# Patient Record
Sex: Female | Born: 1940 | Race: White | Hispanic: No | Smoking: Never smoker
Health system: Southern US, Community
[De-identification: ages and names within clinical notes are randomized; demographics above are authoritative.]

## PROBLEM LIST (undated history)

## (undated) DIAGNOSIS — Z87442 Personal history of urinary calculi: Secondary | ICD-10-CM

## (undated) DIAGNOSIS — Q613 Polycystic kidney, unspecified: Secondary | ICD-10-CM

## (undated) DIAGNOSIS — E559 Vitamin D deficiency, unspecified: Secondary | ICD-10-CM

## (undated) DIAGNOSIS — I10 Essential (primary) hypertension: Secondary | ICD-10-CM

## (undated) DIAGNOSIS — E785 Hyperlipidemia, unspecified: Secondary | ICD-10-CM

## (undated) DIAGNOSIS — M899 Disorder of bone, unspecified: Secondary | ICD-10-CM

## (undated) DIAGNOSIS — I700 Atherosclerosis of aorta: Secondary | ICD-10-CM

## (undated) DIAGNOSIS — M8580 Other specified disorders of bone density and structure, unspecified site: Secondary | ICD-10-CM

## (undated) DIAGNOSIS — K219 Gastro-esophageal reflux disease without esophagitis: Secondary | ICD-10-CM

## (undated) DIAGNOSIS — M47812 Spondylosis without myelopathy or radiculopathy, cervical region: Secondary | ICD-10-CM

## (undated) DIAGNOSIS — M949 Disorder of cartilage, unspecified: Secondary | ICD-10-CM

## (undated) DIAGNOSIS — F0390 Unspecified dementia without behavioral disturbance: Secondary | ICD-10-CM

## (undated) DIAGNOSIS — F419 Anxiety disorder, unspecified: Secondary | ICD-10-CM

## (undated) DIAGNOSIS — E039 Hypothyroidism, unspecified: Secondary | ICD-10-CM

## (undated) DIAGNOSIS — F32A Depression, unspecified: Secondary | ICD-10-CM

## (undated) DIAGNOSIS — G47429 Narcolepsy in conditions classified elsewhere without cataplexy: Secondary | ICD-10-CM

## (undated) DIAGNOSIS — G4700 Insomnia, unspecified: Secondary | ICD-10-CM

## (undated) DIAGNOSIS — C801 Malignant (primary) neoplasm, unspecified: Secondary | ICD-10-CM

## (undated) DIAGNOSIS — N19 Unspecified kidney failure: Secondary | ICD-10-CM

## (undated) HISTORY — DX: Polycystic kidney, unspecified: Q61.3

## (undated) HISTORY — DX: Other specified disorders of bone density and structure, unspecified site: M85.80

## (undated) HISTORY — DX: Atherosclerosis of aorta: I70.0

## (undated) HISTORY — DX: Disorder of bone, unspecified: M94.9

## (undated) HISTORY — DX: Hyperlipidemia, unspecified: E78.5

## (undated) HISTORY — PX: OTHER SURGICAL HISTORY: SHX169

## (undated) HISTORY — PX: CERVICAL DISC SURGERY: SHX588

## (undated) HISTORY — DX: Insomnia, unspecified: G47.00

## (undated) HISTORY — DX: Narcolepsy in conditions classified elsewhere without cataplexy: G47.429

## (undated) HISTORY — PX: ABDOMINAL HYSTERECTOMY: SHX81

## (undated) HISTORY — DX: Gastro-esophageal reflux disease without esophagitis: K21.9

## (undated) HISTORY — DX: Hypothyroidism, unspecified: E03.9

## (undated) HISTORY — DX: Anxiety disorder, unspecified: F41.9

## (undated) HISTORY — PX: PARTIAL NEPHRECTOMY: SHX414

## (undated) HISTORY — DX: Disorder of bone, unspecified: M89.9

## (undated) HISTORY — DX: Essential (primary) hypertension: I10

## (undated) HISTORY — PX: BUNIONECTOMY: SHX129

## (undated) HISTORY — DX: Unspecified kidney failure: N19

## (undated) HISTORY — DX: Vitamin D deficiency, unspecified: E55.9

## (undated) HISTORY — DX: Spondylosis without myelopathy or radiculopathy, cervical region: M47.812

---

## 1998-06-01 ENCOUNTER — Encounter: Payer: Self-pay | Admitting: Orthopedic Surgery

## 1998-06-11 ENCOUNTER — Observation Stay (HOSPITAL_COMMUNITY): Admission: RE | Admit: 1998-06-11 | Discharge: 1998-06-13 | Payer: Self-pay | Admitting: Orthopedic Surgery

## 1999-02-21 ENCOUNTER — Other Ambulatory Visit: Admission: RE | Admit: 1999-02-21 | Discharge: 1999-02-21 | Payer: Self-pay | Admitting: Family Medicine

## 2000-11-21 ENCOUNTER — Other Ambulatory Visit: Admission: RE | Admit: 2000-11-21 | Discharge: 2000-11-21 | Payer: Self-pay | Admitting: Family Medicine

## 2001-07-11 ENCOUNTER — Encounter: Payer: Self-pay | Admitting: Orthopedic Surgery

## 2001-07-12 ENCOUNTER — Observation Stay (HOSPITAL_COMMUNITY): Admission: RE | Admit: 2001-07-12 | Discharge: 2001-07-13 | Payer: Self-pay | Admitting: Orthopedic Surgery

## 2001-07-12 ENCOUNTER — Encounter: Payer: Self-pay | Admitting: Orthopedic Surgery

## 2002-05-20 ENCOUNTER — Encounter: Admission: RE | Admit: 2002-05-20 | Discharge: 2002-05-20 | Payer: Self-pay | Admitting: Family Medicine

## 2002-05-20 ENCOUNTER — Encounter: Payer: Self-pay | Admitting: Family Medicine

## 2002-07-23 ENCOUNTER — Encounter: Payer: Self-pay | Admitting: Urology

## 2002-07-29 ENCOUNTER — Encounter (INDEPENDENT_AMBULATORY_CARE_PROVIDER_SITE_OTHER): Payer: Self-pay

## 2002-07-29 ENCOUNTER — Inpatient Hospital Stay (HOSPITAL_COMMUNITY): Admission: RE | Admit: 2002-07-29 | Discharge: 2002-08-02 | Payer: Self-pay | Admitting: Urology

## 2002-07-29 HISTORY — PX: PARTIAL NEPHRECTOMY: SHX414

## 2002-08-01 ENCOUNTER — Encounter (HOSPITAL_BASED_OUTPATIENT_CLINIC_OR_DEPARTMENT_OTHER): Payer: Self-pay | Admitting: Internal Medicine

## 2002-11-10 ENCOUNTER — Encounter: Admission: RE | Admit: 2002-11-10 | Discharge: 2002-11-10 | Payer: Self-pay | Admitting: Urology

## 2002-11-10 ENCOUNTER — Encounter: Payer: Self-pay | Admitting: Urology

## 2002-12-18 ENCOUNTER — Encounter: Admission: RE | Admit: 2002-12-18 | Discharge: 2003-03-18 | Payer: Self-pay | Admitting: Neurology

## 2004-11-08 ENCOUNTER — Ambulatory Visit: Payer: Self-pay | Admitting: Family Medicine

## 2004-11-15 ENCOUNTER — Ambulatory Visit: Payer: Self-pay | Admitting: Family Medicine

## 2004-11-29 ENCOUNTER — Ambulatory Visit: Payer: Self-pay | Admitting: Family Medicine

## 2004-12-29 ENCOUNTER — Ambulatory Visit: Payer: Self-pay | Admitting: Gastroenterology

## 2005-01-25 ENCOUNTER — Ambulatory Visit: Payer: Self-pay | Admitting: Gastroenterology

## 2005-05-11 ENCOUNTER — Ambulatory Visit: Payer: Self-pay | Admitting: Family Medicine

## 2005-06-15 ENCOUNTER — Ambulatory Visit: Payer: Self-pay | Admitting: Family Medicine

## 2005-08-02 ENCOUNTER — Ambulatory Visit: Payer: Self-pay | Admitting: Family Medicine

## 2005-11-01 ENCOUNTER — Ambulatory Visit: Payer: Self-pay | Admitting: Family Medicine

## 2006-02-20 ENCOUNTER — Ambulatory Visit: Payer: Self-pay | Admitting: Family Medicine

## 2006-02-27 ENCOUNTER — Ambulatory Visit: Payer: Self-pay | Admitting: Family Medicine

## 2006-02-27 ENCOUNTER — Other Ambulatory Visit: Admission: RE | Admit: 2006-02-27 | Discharge: 2006-02-27 | Payer: Self-pay | Admitting: Family Medicine

## 2006-02-27 ENCOUNTER — Encounter: Payer: Self-pay | Admitting: Family Medicine

## 2006-03-06 ENCOUNTER — Encounter: Payer: Self-pay | Admitting: Family Medicine

## 2006-10-16 ENCOUNTER — Ambulatory Visit: Payer: Self-pay | Admitting: Family Medicine

## 2007-03-25 ENCOUNTER — Encounter: Payer: Self-pay | Admitting: Family Medicine

## 2007-03-25 DIAGNOSIS — E785 Hyperlipidemia, unspecified: Secondary | ICD-10-CM

## 2007-03-26 ENCOUNTER — Telehealth: Payer: Self-pay | Admitting: Family Medicine

## 2007-04-12 ENCOUNTER — Encounter: Payer: Self-pay | Admitting: Family Medicine

## 2007-05-27 ENCOUNTER — Ambulatory Visit: Payer: Self-pay | Admitting: Family Medicine

## 2007-05-27 LAB — CONVERTED CEMR LAB
Bilirubin Urine: NEGATIVE
Blood in Urine, dipstick: NEGATIVE
Glucose, Urine, Semiquant: NEGATIVE
Ketones, urine, test strip: NEGATIVE
Nitrite: NEGATIVE
Protein, U semiquant: NEGATIVE
Specific Gravity, Urine: 1.015
Urobilinogen, UA: 0.2
pH: 7

## 2007-06-12 ENCOUNTER — Ambulatory Visit: Payer: Self-pay | Admitting: Family Medicine

## 2007-06-12 DIAGNOSIS — K219 Gastro-esophageal reflux disease without esophagitis: Secondary | ICD-10-CM

## 2007-06-12 DIAGNOSIS — M949 Disorder of cartilage, unspecified: Secondary | ICD-10-CM

## 2007-06-12 DIAGNOSIS — M899 Disorder of bone, unspecified: Secondary | ICD-10-CM | POA: Insufficient documentation

## 2007-06-14 LAB — CONVERTED CEMR LAB
ALT: 15 units/L (ref 0–35)
AST: 20 units/L (ref 0–37)
Albumin: 4.3 g/dL (ref 3.5–5.2)
Basophils Absolute: 0 10*3/uL (ref 0.0–0.1)
Calcium: 9.4 mg/dL (ref 8.4–10.5)
Chloride: 105 meq/L (ref 96–112)
Cholesterol: 152 mg/dL (ref 0–200)
Eosinophils Absolute: 0.4 10*3/uL (ref 0.0–0.6)
GFR calc Af Amer: 71 mL/min
GFR calc non Af Amer: 59 mL/min
HDL: 43.1 mg/dL (ref >39.0)
MCHC: 34.3 g/dL (ref 30.0–36.0)
MCV: 90.1 fL (ref 78.0–100.0)
Neutro Abs: 3.1 10*3/uL (ref 1.4–7.7)
Platelets: 265 10*3/uL (ref 150–400)
RBC: 4.31 M/uL (ref 3.87–5.11)
Sodium: 145 meq/L (ref 135–145)
TSH: 0.87 microintl units/mL (ref 0.35–5.50)
Total CHOL/HDL Ratio: 3.5
Triglycerides: 105 mg/dL (ref 0–149)

## 2007-07-02 ENCOUNTER — Telehealth: Payer: Self-pay | Admitting: Family Medicine

## 2007-08-13 ENCOUNTER — Telehealth: Payer: Self-pay | Admitting: Family Medicine

## 2008-02-12 ENCOUNTER — Ambulatory Visit: Payer: Self-pay | Admitting: Family Medicine

## 2008-02-12 DIAGNOSIS — N19 Unspecified kidney failure: Secondary | ICD-10-CM

## 2008-02-12 DIAGNOSIS — E039 Hypothyroidism, unspecified: Secondary | ICD-10-CM

## 2008-02-12 DIAGNOSIS — Q613 Polycystic kidney, unspecified: Secondary | ICD-10-CM

## 2008-02-12 HISTORY — DX: Hypothyroidism, unspecified: E03.9

## 2008-02-12 HISTORY — DX: Unspecified kidney failure: N19

## 2008-02-12 HISTORY — DX: Polycystic kidney, unspecified: Q61.3

## 2008-02-13 ENCOUNTER — Ambulatory Visit: Payer: Self-pay | Admitting: Family Medicine

## 2008-02-17 LAB — CONVERTED CEMR LAB
CO2: 30 meq/L (ref 19–32)
Chloride: 100 meq/L (ref 96–112)
Creatinine, Ser: 1.2 mg/dL (ref 0.4–1.2)
GFR calc Af Amer: 57 mL/min
Hgb A1c MFr Bld: 5.9 % (ref 4.6–6.0)
Phosphorus: 3.5 mg/dL (ref 2.3–4.6)
Potassium: 4.1 meq/L (ref 3.5–5.1)
TSH: 0.28 microintl units/mL — ABNORMAL LOW (ref 0.35–5.50)

## 2008-04-07 ENCOUNTER — Telehealth: Payer: Self-pay | Admitting: Family Medicine

## 2008-05-19 ENCOUNTER — Ambulatory Visit: Payer: Self-pay | Admitting: Family Medicine

## 2008-05-21 LAB — CONVERTED CEMR LAB: Hgb A1c MFr Bld: 5.9 % (ref 4.6–6.0)

## 2008-06-16 ENCOUNTER — Telehealth: Payer: Self-pay | Admitting: Family Medicine

## 2008-06-17 ENCOUNTER — Ambulatory Visit: Payer: Self-pay | Admitting: Family Medicine

## 2008-06-17 DIAGNOSIS — G47429 Narcolepsy in conditions classified elsewhere without cataplexy: Secondary | ICD-10-CM

## 2008-06-17 DIAGNOSIS — G4700 Insomnia, unspecified: Secondary | ICD-10-CM | POA: Insufficient documentation

## 2008-07-15 ENCOUNTER — Telehealth: Payer: Self-pay | Admitting: Family Medicine

## 2008-08-31 ENCOUNTER — Telehealth: Payer: Self-pay | Admitting: Family Medicine

## 2008-09-02 ENCOUNTER — Ambulatory Visit: Payer: Self-pay | Admitting: Family Medicine

## 2008-10-01 ENCOUNTER — Ambulatory Visit: Payer: Self-pay | Admitting: Family Medicine

## 2008-10-01 LAB — CONVERTED CEMR LAB
Bilirubin Urine: NEGATIVE
Nitrite: NEGATIVE
Protein, U semiquant: NEGATIVE
Urobilinogen, UA: 0.2
WBC Urine, dipstick: NEGATIVE

## 2008-10-08 ENCOUNTER — Ambulatory Visit: Payer: Self-pay | Admitting: Family Medicine

## 2008-10-08 ENCOUNTER — Other Ambulatory Visit: Admission: RE | Admit: 2008-10-08 | Discharge: 2008-10-08 | Payer: Self-pay | Admitting: Family Medicine

## 2008-10-08 ENCOUNTER — Encounter: Payer: Self-pay | Admitting: Family Medicine

## 2008-10-08 LAB — CONVERTED CEMR LAB
ALT: 20 units/L (ref 0–35)
AST: 24 units/L (ref 0–37)
Basophils Absolute: 0 10*3/uL (ref 0.0–0.1)
Basophils Relative: 0.2 % (ref 0.0–3.0)
Bilirubin, Direct: 0.1 mg/dL (ref 0.0–0.3)
CO2: 33 meq/L — ABNORMAL HIGH (ref 19–32)
Calcium: 9.5 mg/dL (ref 8.4–10.5)
Chloride: 96 meq/L (ref 96–112)
Cholesterol: 135 mg/dL (ref 0–200)
Creatinine, Ser: 1.2 mg/dL (ref 0.4–1.2)
Glucose, Bld: 110 mg/dL — ABNORMAL HIGH (ref 70–99)
Hemoglobin: 12.8 g/dL (ref 12.0–15.0)
LDL Cholesterol: 76 mg/dL (ref 0–99)
Lymphocytes Relative: 23.6 % (ref 12.0–46.0)
MCHC: 34.1 g/dL (ref 30.0–36.0)
Monocytes Relative: 6.3 % (ref 3.0–12.0)
Neutro Abs: 4 10*3/uL (ref 1.4–7.7)
Neutrophils Relative %: 66.9 % (ref 43.0–77.0)
RBC: 4.22 M/uL (ref 3.87–5.11)
TSH: 1.12 microintl units/mL (ref 0.35–5.50)
Total Bilirubin: 0.8 mg/dL (ref 0.3–1.2)
Total CHOL/HDL Ratio: 3.3
Total Protein: 6.8 g/dL (ref 6.0–8.3)
VLDL: 18 mg/dL (ref 0–40)
WBC: 6 10*3/uL (ref 4.5–10.5)

## 2008-10-08 LAB — HM PAP SMEAR

## 2008-10-08 LAB — HM COLONOSCOPY

## 2008-10-13 ENCOUNTER — Encounter: Payer: Self-pay | Admitting: Family Medicine

## 2008-11-02 ENCOUNTER — Ambulatory Visit: Payer: Self-pay | Admitting: Family Medicine

## 2008-11-02 LAB — CONVERTED CEMR LAB: OCCULT 3: NEGATIVE

## 2008-11-11 ENCOUNTER — Encounter: Payer: Self-pay | Admitting: Family Medicine

## 2008-12-29 ENCOUNTER — Ambulatory Visit: Payer: Self-pay | Admitting: Family Medicine

## 2009-02-03 ENCOUNTER — Telehealth: Payer: Self-pay | Admitting: Family Medicine

## 2009-02-04 ENCOUNTER — Telehealth: Payer: Self-pay | Admitting: Family Medicine

## 2009-02-17 ENCOUNTER — Encounter: Payer: Self-pay | Admitting: Family Medicine

## 2009-03-09 ENCOUNTER — Ambulatory Visit: Payer: Self-pay | Admitting: Family Medicine

## 2009-03-09 DIAGNOSIS — N959 Unspecified menopausal and perimenopausal disorder: Secondary | ICD-10-CM | POA: Insufficient documentation

## 2009-03-09 HISTORY — DX: Unspecified menopausal and perimenopausal disorder: N95.9

## 2009-03-16 ENCOUNTER — Telehealth: Payer: Self-pay | Admitting: Family Medicine

## 2009-04-13 ENCOUNTER — Encounter: Payer: Self-pay | Admitting: Family Medicine

## 2009-04-15 ENCOUNTER — Encounter: Payer: Self-pay | Admitting: Family Medicine

## 2009-05-04 ENCOUNTER — Telehealth: Payer: Self-pay | Admitting: Family Medicine

## 2009-05-18 ENCOUNTER — Telehealth: Payer: Self-pay | Admitting: Family Medicine

## 2009-05-26 ENCOUNTER — Telehealth: Payer: Self-pay | Admitting: Family Medicine

## 2009-06-22 ENCOUNTER — Encounter (INDEPENDENT_AMBULATORY_CARE_PROVIDER_SITE_OTHER): Payer: Self-pay | Admitting: *Deleted

## 2009-07-05 ENCOUNTER — Encounter: Admission: RE | Admit: 2009-07-05 | Discharge: 2009-07-05 | Payer: Self-pay | Admitting: Urology

## 2009-07-07 ENCOUNTER — Ambulatory Visit (HOSPITAL_BASED_OUTPATIENT_CLINIC_OR_DEPARTMENT_OTHER): Admission: RE | Admit: 2009-07-07 | Discharge: 2009-07-07 | Payer: Self-pay | Admitting: Urology

## 2009-07-12 ENCOUNTER — Telehealth: Payer: Self-pay | Admitting: Family Medicine

## 2009-07-21 ENCOUNTER — Ambulatory Visit: Payer: Self-pay | Admitting: Family Medicine

## 2009-07-21 DIAGNOSIS — Z9889 Other specified postprocedural states: Secondary | ICD-10-CM | POA: Insufficient documentation

## 2009-07-29 ENCOUNTER — Encounter: Payer: Self-pay | Admitting: Family Medicine

## 2009-08-26 ENCOUNTER — Ambulatory Visit: Payer: Self-pay | Admitting: Family Medicine

## 2009-09-03 ENCOUNTER — Encounter: Payer: Self-pay | Admitting: Family Medicine

## 2009-09-21 ENCOUNTER — Ambulatory Visit: Payer: Self-pay | Admitting: Family Medicine

## 2009-10-04 ENCOUNTER — Telehealth: Payer: Self-pay | Admitting: Family Medicine

## 2009-10-05 ENCOUNTER — Ambulatory Visit: Payer: Self-pay | Admitting: Family Medicine

## 2009-10-05 DIAGNOSIS — R000 Tachycardia, unspecified: Secondary | ICD-10-CM

## 2009-10-05 HISTORY — DX: Tachycardia, unspecified: R00.0

## 2009-10-13 ENCOUNTER — Telehealth: Payer: Self-pay | Admitting: Family Medicine

## 2009-11-10 ENCOUNTER — Ambulatory Visit: Payer: Self-pay | Admitting: Family Medicine

## 2009-11-10 DIAGNOSIS — T50995A Adverse effect of other drugs, medicaments and biological substances, initial encounter: Secondary | ICD-10-CM | POA: Insufficient documentation

## 2009-11-10 DIAGNOSIS — E559 Vitamin D deficiency, unspecified: Secondary | ICD-10-CM

## 2009-11-10 HISTORY — DX: Vitamin D deficiency, unspecified: E55.9

## 2009-11-10 LAB — CONVERTED CEMR LAB
Blood in Urine, dipstick: NEGATIVE
Glucose, Urine, Semiquant: NEGATIVE
Ketones, urine, test strip: NEGATIVE
Protein, U semiquant: NEGATIVE
Specific Gravity, Urine: 1.015
pH: 7.5

## 2009-11-12 ENCOUNTER — Ambulatory Visit: Payer: Self-pay | Admitting: Internal Medicine

## 2009-11-18 ENCOUNTER — Telehealth: Payer: Self-pay | Admitting: Family Medicine

## 2009-11-18 LAB — CONVERTED CEMR LAB
Albumin: 3.9 g/dL (ref 3.5–5.2)
Alkaline Phosphatase: 45 units/L (ref 39–117)
BUN: 20 mg/dL (ref 6–23)
Basophils Absolute: 0 10*3/uL (ref 0.0–0.1)
Bilirubin, Direct: 0 mg/dL (ref 0.0–0.3)
CO2: 30 meq/L (ref 19–32)
Calcium: 9 mg/dL (ref 8.4–10.5)
Creatinine, Ser: 0.9 mg/dL (ref 0.4–1.2)
Eosinophils Absolute: 0.1 10*3/uL (ref 0.0–0.7)
Glucose, Bld: 88 mg/dL (ref 70–99)
HDL: 55.5 mg/dL (ref >39.00)
Lymphocytes Relative: 18.8 % (ref 12.0–46.0)
MCHC: 33.9 g/dL (ref 30.0–36.0)
Neutro Abs: 4.3 10*3/uL (ref 1.4–7.7)
Neutrophils Relative %: 70.3 % (ref 43.0–77.0)
Platelets: 263 10*3/uL (ref 150.0–400.0)
RDW: 12.1 % (ref 11.5–14.6)
Total Bilirubin: 0.5 mg/dL (ref 0.3–1.2)
Triglycerides: 133 mg/dL (ref 0.0–149.0)
VLDL: 26.6 mg/dL (ref 0.0–40.0)

## 2009-12-21 ENCOUNTER — Telehealth: Payer: Self-pay | Admitting: Family Medicine

## 2009-12-28 ENCOUNTER — Ambulatory Visit: Payer: Self-pay | Admitting: Family Medicine

## 2009-12-28 DIAGNOSIS — R609 Edema, unspecified: Secondary | ICD-10-CM | POA: Insufficient documentation

## 2010-01-20 ENCOUNTER — Ambulatory Visit: Payer: Self-pay | Admitting: Family Medicine

## 2010-01-20 DIAGNOSIS — L309 Dermatitis, unspecified: Secondary | ICD-10-CM | POA: Insufficient documentation

## 2010-01-20 DIAGNOSIS — L2089 Other atopic dermatitis: Secondary | ICD-10-CM

## 2010-01-20 HISTORY — DX: Dermatitis, unspecified: L30.9

## 2010-01-20 LAB — CONVERTED CEMR LAB
Bilirubin Urine: NEGATIVE
Glucose, Urine, Semiquant: NEGATIVE
Nitrite: NEGATIVE
Specific Gravity, Urine: 1.01
Urobilinogen, UA: 0.2

## 2010-01-31 ENCOUNTER — Ambulatory Visit: Payer: Self-pay | Admitting: Family Medicine

## 2010-01-31 ENCOUNTER — Telehealth: Payer: Self-pay | Admitting: Family Medicine

## 2010-01-31 LAB — CONVERTED CEMR LAB
Bilirubin Urine: NEGATIVE
Ketones, urine, test strip: NEGATIVE
Nitrite: NEGATIVE
Protein, U semiquant: NEGATIVE
Urobilinogen, UA: 0.2

## 2010-02-24 ENCOUNTER — Ambulatory Visit: Payer: Self-pay | Admitting: Family Medicine

## 2010-02-24 DIAGNOSIS — I10 Essential (primary) hypertension: Secondary | ICD-10-CM

## 2010-02-24 HISTORY — DX: Essential (primary) hypertension: I10

## 2010-07-07 ENCOUNTER — Telehealth: Payer: Self-pay | Admitting: Family Medicine

## 2010-08-25 ENCOUNTER — Encounter: Payer: Self-pay | Admitting: Family Medicine

## 2010-08-29 ENCOUNTER — Telehealth: Payer: Self-pay | Admitting: Family Medicine

## 2010-08-30 ENCOUNTER — Telehealth: Payer: Self-pay | Admitting: Family Medicine

## 2010-09-06 ENCOUNTER — Encounter: Payer: Self-pay | Admitting: Family Medicine

## 2010-09-20 NOTE — Assessment & Plan Note (Signed)
Summary: BILATERAL LEG EDEMA / RASH // RS   Vital Signs:  Patient profile:   70 year old female Weight:      132 pounds O2 Sat:      92 % on Room air Temp:     98.6 degrees F Pulse rate:   100 / minute BP sitting:   110 / 70  (left arm)  Vitals Entered By: Pura Spice, RN (January 20, 2010 1:07 PM)  O2 Flow:  Room air CC: burning with urination wants to be ck internally, stated had bladder tacked up 2 months agol sun poison feet    Allergies: 1)  ! Sulfa 2)  ! Codeine   Complete Medication List: 1)  Sertraline Hcl 100 Mg Tabs (Sertraline hcl) .Marland Kitchen.. 1 by mouth two times a day 2)  Klor-con M20 20 Meq Tbcr (Potassium chloride crys cr) .... Take 1 tablet two times a day 3)  Boniva 150 Mg Tabs (Ibandronate sodium) .... Take 1 monthly 4)  Furosemide 40 Mg Tabs (Furosemide) .... Once daily 5)  Dextroamphetamine Sulfate Cr 10 Mg Xr24h-cap (Dextroamphetamine sulfate) .Marland Kitchen.. 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 1 by mouth two times a day if needed  fill Jun 27 2009 7)  Dextroamphetamine Sulfate Cr 10 Mg Xr24h-cap (Dextroamphetamine sulfate) .Marland Kitchen.. 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 fill Jul 27 2009 8)  Zegerid 40-1100 Mg Caps (Omeprazole-sodium bicarbonate) .Marland Kitchen.. 1 once daily 9)  Premarin 0.625 Mg/gm Crea (Estrogens, conjugated) .... Use as directed 10)  Premarin 0.625 Mg Tabs (Estrogens conjugated) .Marland Kitchen.. 1 qd 11)  Macrobid 100 Mg Caps (Nitrofurantoin monohyd macro) .Marland Kitchen.. 1 by mouth two times a day 12)  Simvastatin 80 Mg Tabs (Simvastatin) .Marland Kitchen.. 1 once daily for hyperccholesteremia 13)  Amlodipine Besylate 10 Mg Tabs (Amlodipine besylate) .Marland Kitchen.. 1 each day for hypertension 14)  Lisinopril 20 Mg Tabs (Lisinopril) .Marland Kitchen.. 1 each day for edema and blood pressure and kidney protection 15)  Metoprolol Tartrate 100 Mg Tabs (Metoprolol tartrate) .Marland Kitchen.. 1 two times a day  for bp and heart rate 16)  Cipro 500 Mg Tabs  (Ciprofloxacin hcl) .Marland Kitchen.. 1 am aand  pm 17)  Hydrocodone-acetaminophen 5-500 Mg Tabs (Hydrocodone-acetaminophen) .... One q.4 h. p.r.n. for pain we'll 4 per day  Other Orders: UA Dipstick w/o Micro (automated)  (81003) T-Culture, Urine ([id number]) Prescriptions: HYDROCODONE-ACETAMINOPHEN 5-500 MG TABS (HYDROCODONE-ACETAMINOPHEN) one q.4 h. p.r.n. for pain we'll 4 per day  #100 x 5   Entered and Authorized by:   Judithann Sheen MD   Signed by:   Judithann Sheen MD on 01/31/2010   Method used:   Telephoned to ...       [facility] (retail)       [address]       [place], [place]  [id number]       Ph: [phone] or [phone]       Fax: [phone]   RxID:   [id number] CIPRO 500 MG TABS (CIPROFLOXACIN HCL) 1 AM aand  pm  #30 x 1   Entered and Authorized by:   Judithann Sheen MD   Signed by:   Judithann Sheen MD on 01/20/2010   Method used:   Electronically to        [facility]* (retail)       [address]  [place], [place]  [id number]       Ph: [phone] or [phone]       Fax: [phone]   RxID:   [id number]   Laboratory Results   Urine Tests    Routine Urinalysis   Color: yellow Appearance: Clear Glucose: negative   (Normal Range: Negative) Bilirubin: negative   (Normal Range: Negative) Ketone: negative   (Normal Range: Negative) Spec. Gravity: 1.010   (Normal Range: 1.003-1.035) Blood: trace-lysed   (Normal Range: Negative) pH: 6.0   (Normal Range: 5.0-8.0) Protein: negative   (Normal Range: Negative) Urobilinogen: 0.2   (Normal Range: 0-1) Nitrite: negative   (Normal Range: Negative) Leukocyte Esterace: 2+   (Normal Range: Negative)    Comments: Rita Ohara  January 20, 2010 1:16 PM     Appended Document: BILATERAL LEG EDEMA / RASH // RS     Vital Signs:  Patient profile:   70 year old female Weight:      132 pounds O2 Sat:      92 % Temp:     98.6 degrees F Pulse rate:   100 /  minute Resp:     16 per minute BP sitting:   110 / 70  (left arm)  History of Present Illness: This 70 year old married female over the past week has had urinary frequency and dysuria which has increased in severity. She also is complaining of a rash over the left lower extremities which he feels is on portions and she was sitting inside and when not truly sunburn but developed an erythematous rash as well as edema of the lower extremities She has had a urethral sling surgery and would like for me to do a pelvic exam and her determined that there was no problem Hypertensive patient with good blood pressure 110/70  Allergies: 1)  ! Sulfa 2)  ! Codeine  Past History:  Past Medical History: Last updated: 03/25/2007 Hyperlipidemia Hypertension polycystic kidney disease  Past Surgical History: Last updated: 08/26/2009 Nephrectomy  (partial left for angiomyolipoma Hysterectomy Cervical Disk Bunionectomy Fx RT Ankle LEFT TEMPORAL BX ARTERY --NEGATIVE 2  bladder operation, but per sling procedure  Review of Systems      See HPI  The patient denies anorexia, fever, weight loss, weight gain, vision loss, decreased hearing, hoarseness, chest pain, syncope, dyspnea on exertion, peripheral edema, prolonged cough, headaches, hemoptysis, abdominal pain, melena, hematochezia, severe indigestion/heartburn, hematuria, incontinence, genital sores, muscle weakness, suspicious skin lesions, transient blindness, difficulty walking, depression, unusual weight change, abnormal bleeding, enlarged lymph nodes, angioedema, breast masses, and testicular masses.    Physical Exam  General:  Well-developed,well-nourished,in no acute distress; alert,appropriate and cooperative throughout examination Head:  Normocephalic and atraumatic without obvious abnormalities. No apparent alopecia or balding. Lungs:  Normal respiratory effort, chest expands symmetrically. Lungs are clear to auscultation, no crackles or  wheezes. Heart:  Normal rate and regular rhythm. S1 and S2 normal without gallop, murmur, click, rub or other extra sounds. Abdomen:  no CVA tenderness, abdominal exam and  Especially over the suprapubic area was normal with no masses or tenderness Genitalia:  Normal introitus for age, no external lesions, no vaginal discharge, mucosa pink and moist, no vaginal or cervical lesions, no vaginal atrophy, no friaility or hemorrhage, normal uterus size and position, no adnexal masses or tenderness urethral sling in good condition Msk:  erythematous rash over the lower extremities bilaterally with a 2+ pretibial edema Extremities:  left pretibial edema and right pretibial edema.     Impression & Recommendations:  Problem # 1:  EDEMA (ICD-782.3) Assessment New  Her updated medication list for this problem includes:    Furosemide 40 Mg Tabs (Furosemide) ..... Once daily  Problem # 2:  DERMATITIS, ATOPIC (ICD-691.8) Assessment: New continue Allegra and apply the steroid cream that you have, triamcinolone  Problem # 3:  CYSTITIS, ACUTE (ICD-595.0) Assessment: New  Her updated medication list for this problem includes:    Macrobid 100 Mg Caps (Nitrofurantoin monohyd macro) .Marland Kitchen... 1 by mouth two times a day    Cipro 500 Mg Tabs (Ciprofloxacin hcl) .Marland Kitchen... 1 am aand  pm 2 culture urine  Orders: Prescription Created Electronically [id number])  Complete Medication List: 1)  Sertraline Hcl 100 Mg Tabs (Sertraline hcl) .Marland Kitchen.. 1 by mouth two times a day 2)  Klor-con M20 20 Meq Tbcr (Potassium chloride crys cr) .... Take 1 tablet two times a day 3)  Boniva 150 Mg Tabs (Ibandronate sodium) .... Take 1 monthly 4)  Furosemide 40 Mg Tabs (Furosemide) .... Once daily 5)  Dextroamphetamine Sulfate Cr 10 Mg Xr24h-cap (Dextroamphetamine sulfate) .Marland Kitchen.. 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 1 by mouth two times a day if needed  fill Jun 27 2009 7)  Dextroamphetamine Sulfate Cr 10 Mg Xr24h-cap (Dextroamphetamine sulfate) .Marland Kitchen.. 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 fill Jul 27 2009 8)  Zegerid 40-1100 Mg Caps (Omeprazole-sodium bicarbonate) .Marland Kitchen.. 1 once daily 9)  Premarin 0.625 Mg/gm Crea (Estrogens, conjugated) .... Use as directed 10)  Premarin 0.625 Mg Tabs (Estrogens conjugated) .Marland Kitchen.. 1 qd 11)  Macrobid 100 Mg Caps (Nitrofurantoin monohyd macro) .Marland Kitchen.. 1 by mouth two times a day 12)  Simvastatin 80 Mg Tabs (Simvastatin) .Marland Kitchen.. 1 once daily for hyperccholesteremia 13)  Amlodipine Besylate 10 Mg Tabs (Amlodipine besylate) .Marland Kitchen.. 1 each day for hypertension 14)  Lisinopril 20 Mg Tabs (Lisinopril) .Marland Kitchen.. 1 each day for edema and blood pressure and kidney protection 15)  Metoprolol Tartrate 100 Mg Tabs (Metoprolol tartrate) .Marland Kitchen.. 1 two times a day  for bp and heart rate 16)  Cipro 500 Mg Tabs (Ciprofloxacin hcl) .Marland Kitchen.. 1 am aand  pm 17)  Hydrocodone-acetaminophen 5-500 Mg Tabs (Hydrocodone-acetaminophen) .... One q.4 h. p.r.n. for pain we'll 4 per day  Patient Instructions: 1)  you have acute cystitis which I will treat with Cipro 500 mg b.i.d. for 2 weeks. I am going to culture the specimen to make certain we using Correct antibiotic 2)  Continue Allegra and triamcinolone as directed for rash which I feel  3)  His son poisoning 4)  Pelvic exam reveals everything to be normal and you have good results from your skin surgery

## 2010-09-20 NOTE — Progress Notes (Signed)
Summary: RX for UTI  Phone Note Call from Patient   Caller: Patient Call For: Judithann Sheen MD Summary of Call: Pt is out of town and is complaining of UTI.  [place].  Would like RX sent to pharmacy .........[facility]  [phone] Call pt when ready. No phone number left. Pt is complaining of frequency, dysuria x 1 day. no fever pain and minimal low back pain.  Initial call taken by: Lynann Beaver CMA,  Dec 21, 2009 8:39 AM  Follow-up for Phone Call        call in Macrobid 100 mg two times a day for 7 days Follow-up by: Nelwyn Salisbury MD,  Dec 21, 2009 1:11 PM  Additional Follow-up for Phone Call Additional follow up Details #1::        [facility]   left mess on pt home phone since she wanted to be called andno number to reach her.   Additional Follow-up by: Pura Spice, RN,  Dec 21, 2009 1:22 PM    New/Updated Medications: MACROBID 100 MG CAPS (NITROFURANTOIN MONOHYD MACRO) 1 by mouth two times a day

## 2010-09-20 NOTE — Letter (Signed)
Summary: [facility]   Imported By: Maryln Gottron 09/16/2009 13:36:03  _____________________________________________________________________  External Attachment:    Type:   Image     Comment:   External Document

## 2010-09-20 NOTE — Assessment & Plan Note (Signed)
Summary: FOLLOW UP ON BLOOD PRESSURE/CJR   Vital Signs:  Patient profile:   70 year old female Weight:      132 pounds Temp:     98.2 degrees F oral Pulse rate:   105 / minute Pulse rhythm:   regular BP sitting:   148 / 100  Vitals Entered By: Lynann Beaver CMA (September 21, 2009 11:45 AM) CC: BP check  Pt has manipulated all of her meds lately Is Patient Diabetic? No   History of Present Illness: This 70 year old white female with known hypertension has had problems in the past with being able to control it but recently had done were well. However in the past 2-3 weeks he has been elevated and she has not been able to manipulate her medications to control it. Her blood pressure on arrival today was 148/100, repeated 150/100 She has had no chest pain no shortness of breath no headache no dizziness however relates she does get tired after considerable exertion We spent approximately 20 minutes discussing some of the problems in her life and she desired my opinion regarding her activities and desire.  Current Medications (verified): 1)  Sertraline Hcl 100 Mg  Tabs (Sertraline Hcl) .Marland Kitchen.. 1 By Mouth Two Times A Day 2)  Metoprolol Succinate 200 Mg  Tb24 (Metoprolol Succinate) .... Once Daily 3)  Klor-Con M20 20 Meq  Tbcr (Potassium Chloride Crys Cr) .... Take 1 Tablet Two Times A Day 4)  Alprazolam 0.5 Mg  Tabs (Alprazolam) .... Three Times A Day As Needed  Fo Stress 5)  Vytorin 10-40 Mg  Tabs (Ezetimibe-Simvastatin) .... Once Daily 6)  Lotrel 10-40 Mg  Caps (Amlodipine Besy-Benazepril Hcl) .... Once Daily 7)  Boniva 150 Mg  Tabs (Ibandronate Sodium) .... Take 1 Monthly 8)  Furosemide 40 Mg  Tabs (Furosemide) .... Once Daily 9)  Clonidine Hcl 0.3 Mg  Tabs (Clonidine Hcl) .... Two Times A Day 10)  Zolpidem Tartrate 10 Mg  Tabs (Zolpidem Tartrate) .Marland Kitchen.. 1 At Bedtime As Needed For Sleep 11)  Hydrocodone-Acetaminophen 5-500 Mg  Tabs (Hydrocodone-Acetaminophen) .... Take One Tablet Every 4 Hrs  As Needed Pain  Maximum of 4 Tablets Per Day 12)  Protonix 40 Mg  Tbec (Pantoprazole Sodium) .Marland Kitchen.. 1 By Mouth Once Daily. 13)  Dextroamphetamine Sulfate Cr 10 Mg Xr24h-Cap (Dextroamphetamine Sulfate) .Marland Kitchen.. 1 By Mouth Two Times A Day As Needed  Fill For May 27 2009 14)  Dextroamphetamine Sulfate Cr 10 Mg Xr24h-Cap (Dextroamphetamine Sulfate) .Marland Kitchen.. 1 By Mouth Two Times A Day If Needed  Fill Jun 27 2009 15)  Dextroamphetamine Sulfate Cr 10 Mg Xr24h-Cap (Dextroamphetamine Sulfate) .Marland Kitchen.. 1 By Mouth Two Times A Day If Needed For Narcoplepsy. Fill Jul 27 2009 16)  Vesicare 5 Mg Tabs (Solifenacin Succinate) .Marland Kitchen.. 1 Qd 17)  Zegerid 40-1100 Mg Caps (Omeprazole-Sodium Bicarbonate) .Marland Kitchen.. 1 Once Daily 18)  Premarin 0.45 Mg Tabs (Estrogens Conjugated) .Marland Kitchen.. 1 Qd  Allergies (verified): 1)  ! Sulfa 2)  ! Codeine  Past History:  Past Medical History: Last updated: 03/25/2007 Hyperlipidemia Hypertension polycystic kidney disease  Past Surgical History: Last updated: 08/26/2009 Nephrectomy  (partial left for angiomyolipoma Hysterectomy Cervical Disk Bunionectomy Fx RT Ankle LEFT TEMPORAL BX ARTERY --NEGATIVE 2  bladder operation, but per sling procedure  Review of Systems      See HPI General:  See HPI; Denies chills, fatigue, fever, loss of appetite, malaise, sleep disorder, sweats, weakness, and weight loss. Eyes:  Denies blurring, discharge, double vision, eye irritation, eye  pain, halos, itching, light sensitivity, red eye, vision loss-1 eye, and vision loss-both eyes. ENT:  Denies decreased hearing, difficulty swallowing, ear discharge, earache, hoarseness, nasal congestion, nosebleeds, postnasal drainage, ringing in ears, sinus pressure, and sore throat. CV:  Denies bluish discoloration of lips or nails, chest pain or discomfort, difficulty breathing at night, difficulty breathing while lying down, fainting, fatigue, leg cramps with exertion, lightheadness, near fainting, palpitations, shortness of  breath with exertion, swelling of feet, swelling of hands, and weight gain. Resp:  Denies chest discomfort, chest pain with inspiration, cough, coughing up blood, excessive snoring, hypersomnolence, morning headaches, pleuritic, shortness of breath, sputum productive, and wheezing. GI:  See HPI. GU:  Denies abnormal vaginal bleeding, decreased libido, discharge, dysuria, genital sores, hematuria, incontinence, nocturia, urinary frequency, and urinary hesitancy; her problem was urinary urgency and incontinence is greatly improved since her bladder surgery. MS:  Denies joint pain, joint redness, joint swelling, loss of strength, low back pain, mid back pain, muscle aches, muscle , cramps, muscle weakness, stiffness, and thoracic pain.  Physical Exam  General:  Well-developed,well-nourished,in no acute distress; alert,appropriate and cooperative throughout examination Eyes:  her erythematous conjunctiva left eye minimal purulent drainage Lungs:  Normal respiratory effort, chest expands symmetrically. Lungs are clear to auscultation, no crackles or wheezes. Heart:  Normal rate and regular rhythm. S1 and S2 normal without gallop, murmur, click, rub or other extra sounds. Abdomen:  Bowel sounds positive,abdomen soft and non-tender without masses, organomegaly or hernias noted. Extremities:  No clubbing, cyanosis, edema, or deformity noted with normal full range of motion of all joints.     Complete Medication List: 1)  Sertraline Hcl 100 Mg Tabs (Sertraline hcl) .Marland Kitchen.. 1 by mouth two times a day 2)  Metoprolol Succinate 200 Mg Tb24 (Metoprolol succinate) .... Once daily 3)  Klor-con M20 20 Meq Tbcr (Potassium chloride crys cr) .... Take 1 tablet two times a day 4)  Alprazolam 0.5 Mg Tabs (Alprazolam) .... Three times a day as needed  fo stress 5)  Vytorin 10-40 Mg Tabs (Ezetimibe-simvastatin) .... Once daily 6)  Lotrel 10-40 Mg Caps (Amlodipine besy-benazepril hcl) .... Once daily 7)  Boniva 150 Mg  Tabs (Ibandronate sodium) .... Take 1 monthly 8)  Furosemide 40 Mg Tabs (Furosemide) .... Once daily 9)  Clonidine Hcl 0.3 Mg Tabs (Clonidine hcl) .... Two times a day 10)  Zolpidem Tartrate 10 Mg Tabs (Zolpidem tartrate) .Marland Kitchen.. 1 at bedtime as needed for sleep 11)  Hydrocodone-acetaminophen 5-500 Mg Tabs (Hydrocodone-acetaminophen) .... Take one tablet every 4 hrs as needed pain  maximum of 4 tablets per day 12)  Protonix 40 Mg Tbec (Pantoprazole sodium) .Marland Kitchen.. 1 by mouth once daily. 13)  Dextroamphetamine Sulfate Cr 10 Mg Xr24h-cap (Dextroamphetamine sulfate) .Marland Kitchen.. 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 1 by mouth two times a day if needed  fill Jun 27 2009 15)  Dextroamphetamine Sulfate Cr 10 Mg Xr24h-cap (Dextroamphetamine sulfate) .Marland Kitchen.. 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 fill Jul 27 2009 16)  Vesicare 5 Mg Tabs (Solifenacin succinate) .Marland Kitchen.. 1 qd 17)  Zegerid 40-1100 Mg Caps (Omeprazole-sodium bicarbonate) .Marland Kitchen.. 1 once daily 18)  Azor 10-40 Mg Tabs (Amlodipine-olmesartan) .Marland Kitchen.. 1 each day 19)  Tobradex St 0.3-0.05 % Susp (Tobramycin-dexamethasone) .Marland Kitchen.. 1 qtt in left eye three times a day, 1 qtt in rt eye hs for conjunctivitis 20)  Premarin 0.625 Mg/gm Crea (Estrogens, conjugated) .... Use as directed  Other Orders: UA Dipstick w/o Micro (automated)  (81003) Prescription Created Electronically [id number])  Patient Instructions: 1)  start Tazorac 10 days 40 q.d. for your blood pressure and continue your regular medications 2)  Keep conjunctivitis in the left eye 3)  tobramycin ophthalmic drops one drop in 3 times daily until clear for about 2 days after appears normal Prescriptions: PREMARIN 0.625 MG/GM CREA (ESTROGENS, CONJUGATED) use as directed  #1 x 11   Entered by:   Pura Spice, RN   Authorized by:   Judithann Sheen MD   Signed by:   Pura Spice, RN on 10/13/2009   Method used:    Electronically to        [facility] (retail)       [address]       [place], [place]  [id number]       Ph: [phone] or [phone]       Fax: [phone]   RxID:   [id number] PREMARIN 0.45 MG TABS (ESTROGENS CONJUGATED) 1 qd  #30 x 0   Entered by:   Pura Spice, RN   Authorized by:   Judithann Sheen MD   Signed by:   Pura Spice, RN on 10/07/2009   Method used:   Electronically to        [facility] (retail)       [address]       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       [place], [place]  [id number]       Ph: [phone] or [phone]       Fax: [phone]   RxID:   [id number] AZOR 10-40 MG TABS (AMLODIPINE-OLMESARTAN) 1 each day  #30 x 11   Entered and Authorized by:   Judithann Sheen MD   Signed by:   Judithann Sheen MD on 09/21/2009   Method used:   Electronically to        [facility] (retail)       [address]       [place], [place]  [address]       Ph: [phone] or [phone]       Fax: [phone]   RxID:   [id number]

## 2010-09-20 NOTE — Assessment & Plan Note (Signed)
Summary: new onset of confusion/dm   Vital Signs:  Patient profile:   70 year old female Weight:      132.5 pounds O2 Sat:      97 % Temp:     98.9 degrees F Pulse rate:   57 / minute BP sitting:   100 / 60  (left arm) BP standing:   80 / 56  (left arm)  Vitals Entered By: Pura Spice, RN (August 26, 2009 11:39 AM) CC: nausea dizzy yest. stated slept for awhile and when woke up could not remember if she retired  or if she was still at work. stated she went to work this am and and passed out at work dont know how long she was out but she told co workers not to call EMS  stated she leaned her head really far back  states she feels knot on back of her head      History of Present Illness: BP sitting  90/60 and standing rt arm 80/60 Patient relates she had fallen one day previously and bumped the back of her head, was not unconscious and was well or her with her in viral blood pressure was low 100/60 and we have had problems with hypertension in the past now blood pressure is low enough that she is having problem with positional hypotension causing dizziness and occasionally nausea She was taken to the hospital and responded very well so no change in treatment The patient is not incontinent anymore since she has a sling procedure done to her bladder She is very happy since she does not have any urinary incontinence now Emotionally she is doing her well since her trauma and is to continue her sertraline and alprazolam. For probable GERD has been well controlled. 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 Sulfa 2)  ! Codeine  Past History:  Past Medical History: Last updated: 03/25/2007 Hyperlipidemia Hypertension polycystic kidney disease  Past Surgical History: Nephrectomy  (partial left for  angiomyolipoma Hysterectomy Cervical Disk Bunionectomy Fx RT Ankle LEFT TEMPORAL BX ARTERY --NEGATIVE 2  bladder operation, but per sling procedure  Review of Systems  The patient denies anorexia, fever, weight loss, weight gain, vision loss, decreased hearing, hoarseness, chest pain, syncope, dyspnea on exertion, peripheral edema, prolonged cough, headaches, hemoptysis, abdominal pain, melena, hematochezia, severe indigestion/heartburn, hematuria, incontinence, genital sores, muscle weakness, suspicious skin lesions, transient blindness, difficulty walking, depression, unusual weight change, abnormal bleeding, enlarged lymph nodes, angioedema, breast masses, and testicular masses.    Physical Exam  General:  Well-developed,well-nourished,in no acute distress; alert,appropriate and cooperative throughout examination Head:  Normocephalic and atraumatic without obvious abnormalities. No apparent alopecia or balding. Eyes:  No corneal or conjunctival inflammation noted. EOMI. Perrla. Funduscopic exam benign, without hemorrhages, exudates or papilledema. Vision grossly normal.no nystagmus Ears:  External ear exam shows no significant lesions or deformities.  Otoscopic examination reveals clear canals, tympanic membranes are intact bilaterally without bulging, retraction, inflammation or discharge. Hearing is grossly normal bilaterally. Nose:  External nasal examination shows no deformity or inflammation. Nasal mucosa are pink and moist without lesions or exudates. Mouth:  Oral mucosa and oropharynx without lesions or exudates.  Teeth in good repair. Neck:  No deformities, masses, or tenderness noted. Lungs:  Normal respiratory effort, chest expands symmetrically. Lungs are clear to auscultation, no crackles or wheezes.  Heart:  Normal rate and regular rhythm. S1 and S2 normal without gallop, murmur, click, rub or other extra sounds. Abdomen:  Bowel sounds positive,abdomen soft and non-tender  without masses, organomegaly or hernias noted. Extremities:  No clubbing, cyanosis, edema, or deformity noted with normal full range of motion of all joints.     Impression & Recommendations:  Problem # 1:  POSTMENOPAUSAL SYNDROME (ICD-627.9) Assessment Unchanged  Her updated medication list for this problem includes:    Premarin 0.45 Mg Tabs (Estrogens conjugated) .Marland Kitchen... 1 qd  Problem # 2:  URINARY INCONTINENCE (ICD-788.30) Assessment: Improved  Problem # 3:  OVERACTIVE BLADDER (ICD-596.51) Assessment: Improved  Problem # 4:  INSOMNIA (ICD-780.52) Assessment: Improved  Her updated medication list for this problem includes:    Zolpidem Tartrate 10 Mg Tabs (Zolpidem tartrate) .Marland Kitchen... 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 Once daily  Problem # 9:  POLYCYSTIC KIDNEY DISEASE (ICD-753.12) Assessment: Unchanged  Problem # 10:  HYPERTENSION (ICD-401.9) Assessment: Improved  Her updated medication list for this problem includes:    Metoprolol Succinate 200 Mg Tb24 (Metoprolol succinate) ..... Once daily    Lotrel 10-40 Mg Caps (Amlodipine besy-benazepril hcl) ..... Once daily    Furosemide 40 Mg Tabs (Furosemide) ..... Once daily    Clonidine Hcl 0.3 Mg Tabs (Clonidine hcl) .Marland Kitchen..Marland Kitchen Two times a day  Problem # 11:  HYPERLIPIDEMIA (ICD-272.4) Assessment: Unchanged  Her updated medication list for this problem includes:    Vytorin 10-40 Mg Tabs (Ezetimibe-simvastatin) ..... Once daily  Complete Medication List: 1)  Sertraline Hcl 100 Mg Tabs (Sertraline hcl) .Marland Kitchen.. 1 by mouth two times a day 2)  Metoprolol Succinate 200 Mg Tb24 (Metoprolol succinate) .... Once daily 3)  Klor-con M20 20 Meq Tbcr (Potassium chloride crys cr) .... Take 1 tablet  two times a day 4)  Alprazolam 0.5 Mg Tabs (Alprazolam) .... Three times a day as needed  fo stress 5)  Vytorin 10-40 Mg Tabs (Ezetimibe-simvastatin) .... Once daily 6)  Lotrel 10-40 Mg Caps (Amlodipine besy-benazepril hcl) .... Once daily 7)  Boniva 150 Mg Tabs (Ibandronate sodium) .... Take 1 monthly 8)  Furosemide 40 Mg Tabs (Furosemide) .... Once daily 9)  Clonidine Hcl 0.3 Mg Tabs (Clonidine hcl) .... Two times a day 10)  Zolpidem Tartrate 10 Mg Tabs (Zolpidem tartrate) .Marland Kitchen.. 1 at bedtime as needed for sleep 11)  Hydrocodone-acetaminophen 5-500 Mg Tabs (Hydrocodone-acetaminophen) .... Take one tablet every 4 hrs as needed pain  maximum of 4 tablets per day 12)  Protonix 40 Mg Tbec (Pantoprazole sodium) .Marland Kitchen.. 1 by mouth once daily. 13)  Dextroamphetamine Sulfate Cr 10 Mg Xr24h-cap (Dextroamphetamine sulfate) .Marland Kitchen.. 1 by mouth two times a day as needed  fill for May 27 2009 14)  Dextroamphetamine Sulfate Cr 10 Mg Xr24h-cap (Dextroamphetamine sulfate) .Marland Kitchen.. 1 by mouth two times a day if needed  fill Jun 27 2009 15)  Dextroamphetamine Sulfate Cr 10 Mg Xr24h-cap (Dextroamphetamine sulfate) .Marland Kitchen.. 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 fill Jul 27 2009 16)  Vesicare 5 Mg Tabs (Solifenacin succinate) .Marland Kitchen.. 1 qd 17)  Zegerid 40-1100 Mg Caps (Omeprazole-sodium bicarbonate) .Marland Kitchen.. 1 once daily 18)  Premarin 0.45 Mg Tabs (Estrogens conjugated) .Marland Kitchen.. 1 qd  Patient Instructions: 1)  Refill he had episode of hypotension and will decrease clonidine and keep close check on blood pressure 2)  Very happy with the results of the bladder operation. 3)  Continue other medications as prescribed

## 2010-09-20 NOTE — Progress Notes (Signed)
Summary: rx premarin vag cream   Phone Note From Pharmacy   Caller: [facility] Reason for Call: Needs renewal Summary of Call: wants premarin vaginal cream 0.625mg  cream  Initial call taken by: Pura Spice, RN,  October 13, 2009 11:54 AM  Follow-up for Phone Call        faxed over with 11 refills Follow-up by: Pura Spice, RN,  October 13, 2009 11:54 AM

## 2010-09-20 NOTE — Assessment & Plan Note (Signed)
Summary: consult re: problems aging diseases/cjr/pt rescd per gina//ccm   Vital Signs:  Patient profile:   70 year old female Weight:      129 pounds O2 Sat:      96 % Temp:     98.1 degrees F Pulse rate:   69 / minute Pulse rhythm:   regular BP sitting:   130 / 82  (left arm) Cuff size:   regular  Vitals Entered By: Pura Spice, RN (February 24, 2010 4:59 PM) CC: concerned about forgetting things    History of Present Illness: This 70 year old white female female with known hypertension which is controlled, past history of renal failure with with polycystic kidney disease, has had some problem with narcolepsy which has been controlled with dextroamphetamine but patient has not been having tube take his in the past 2-3 months Her reasons a his today is that she has had some concern about her short-term memory loss her primary reason for coming in that her husband and they cause him our concern. She doesn't get lost and is able to drive to any destination that she desires and remembers how to return home. She hasn't lost her car and remembers where she parks her states she does have some problem remembering names but they gradually come to her She remembers what foods she had for the past 3 meals. Hasn't lost her keys in \\par    Allergies: 1)  ! Sulfa 2)  ! Codeine  Past History:  Past Medical History: Last updated: 03/25/2007 Hyperlipidemia Hypertension polycystic kidney disease  Past Surgical History: Last updated: 08/26/2009 Nephrectomy  (partial left for angiomyolipoma Hysterectomy Cervical Disk Bunionectomy Fx RT Ankle LEFT TEMPORAL BX ARTERY --NEGATIVE 2  bladder operation, but per sling procedure  Review of Systems      See HPI  The patient denies anorexia, fever, weight loss, weight gain, vision loss, decreased hearing, hoarseness, chest pain, syncope, dyspnea on exertion, peripheral edema, prolonged cough, headaches, hemoptysis, abdominal pain, melena, hematochezia,  severe indigestion/heartburn, hematuria, incontinence, genital sores, muscle weakness, suspicious skin lesions, transient blindness, difficulty walking, depression, unusual weight change, abnormal bleeding, enlarged lymph nodes, angioedema, breast masses, and testicular masses.   Neuro:  See HPI; Denies difficulty with concentration, disturbances in coordination, falling down, inability to speak, numbness, poor balance, seizures, sensation of room spinning, tingling, tremors, visual disturbances, and weakness.  Physical Exam  General:  Well-developed,well-nourished,in no acute distress; alert,appropriate and cooperative throughout examination Head:  Normocephalic and atraumatic without obvious abnormalities. No apparent alopecia or balding. Eyes:  No corneal or conjunctival inflammation noted. EOMI. Perrla. Funduscopic exam benign, without hemorrhages, exudates or papilledema. Vision grossly normal. Ears:  External ear exam shows no significant lesions or deformities.  Otoscopic examination reveals clear canals, tympanic membranes are intact bilaterally without bulging, retraction, inflammation or discharge. Hearing is grossly normal bilaterally. Nose:  External nasal examination shows no deformity or inflammation. Nasal mucosa are pink and moist without lesions or exudates. Mouth:  Oral mucosa and oropharynx without lesions or exudates.  Teeth in good repair. Lungs:  Normal respiratory effort, chest expands symmetrically. Lungs are clear to auscultation, no crackles or wheezes. Heart:  Normal rate and regular rhythm. S1 and S2 normal without gallop, murmur, click, rub or other extra sounds. Msk:  No deformity or scoliosis noted of thoracic or lumbar spine.   Pulses:  R and L carotid,radial,femoral,dorsalis pedis and posterior tibial pulses are full and equal bilaterally Extremities:  No clubbing, cyanosis, edema, or deformity noted with  normal full range of motion of all joints.   Neurologic:  No  cranial nerve deficits noted. Station and gait are normal. Plantar reflexes are down-going bilaterally. DTRs are symmetrical throughout. Sensory, motor and coordinative functions appear intact. Psych:  Cognition and judgment appear intact. Alert and cooperative with normal attention span and concentration. No apparent delusions, illusions, hallucinations   Impression & Recommendations:  Problem # 1:  ESSENTIAL HYPERTENSION (ICD-401.9) Assessment Improved  Her updated medication list for this problem includes:    Furosemide 40 Mg Tabs (Furosemide) ..... Once daily    Amlodipine Besylate 10 Mg Tabs (Amlodipine besylate) .Marland Kitchen... 1 each day for hypertension    Lisinopril 20 Mg Tabs (Lisinopril) .Marland Kitchen... 1 each day for edema and blood pressure and kidney protection    Metoprolol Tartrate 100 Mg Tabs (Metoprolol tartrate) .Marland Kitchen... 1 two times a day  for bp and heart rate  Problem # 2:  EDEMA (ICD-782.3) Assessment: Improved  Her updated medication list for this problem includes:    Furosemide 40 Mg Tabs (Furosemide) ..... Once daily  Problem # 3:  POSTMENOPAUSAL SYNDROME (ICD-627.9) Assessment: Improved  Her updated medication list for this problem includes:    Premarin 0.625 Mg/gm Crea (Estrogens, conjugated) ..... Use as directed    Premarin 0.625 Mg Tabs (Estrogens conjugated) .Marland Kitchen... 1 qd  Problem # 4:  RENAL FAILURE (ICD-586) Assessment: Improved  Problem # 5:  HYPERLIPIDEMIA (ICD-272.4) Assessment: Improved  Her updated medication list for this problem includes:    Simvastatin 80 Mg Tabs (Simvastatin) .Marland Kitchen... 1 once daily for hyperccholesteremia  Complete Medication List: 1)  Sertraline Hcl 100 Mg Tabs (Sertraline hcl) .Marland Kitchen.. 1 by mouth two times a day 2)  Klor-con M20 20 Meq Tbcr (Potassium chloride crys cr) .... Take 1 tablet two times a day 3)  Boniva 150 Mg Tabs (Ibandronate sodium) .... Take 1 monthly 4)  Furosemide 40 Mg Tabs (Furosemide) .... Once daily 5)  Dextroamphetamine  Sulfate Cr 10 Mg Xr24h-cap (Dextroamphetamine sulfate) .Marland Kitchen.. 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 1 by mouth two times a day if needed  fill Jun 27 2009 7)  Dextroamphetamine Sulfate Cr 10 Mg Xr24h-cap (Dextroamphetamine sulfate) .Marland Kitchen.. 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 fill Jul 27 2009 8)  Zegerid 40-1100 Mg Caps (Omeprazole-sodium bicarbonate) .Marland Kitchen.. 1 once daily 9)  Premarin 0.625 Mg/gm Crea (Estrogens, conjugated) .... Use as directed 10)  Premarin 0.625 Mg Tabs (Estrogens conjugated) .Marland Kitchen.. 1 qd 11)  Macrobid 100 Mg Caps (Nitrofurantoin monohyd macro) .Marland Kitchen.. 1 by mouth two times a day 12)  Simvastatin 80 Mg Tabs (Simvastatin) .Marland Kitchen.. 1 once daily for hyperccholesteremia 13)  Amlodipine Besylate 10 Mg Tabs (Amlodipine besylate) .Marland Kitchen.. 1 each day for hypertension 14)  Lisinopril 20 Mg Tabs (Lisinopril) .Marland Kitchen.. 1 each day for edema and blood pressure and kidney protection 15)  Metoprolol Tartrate 100 Mg Tabs (Metoprolol tartrate) .Marland Kitchen.. 1 two times a day  for bp and heart rate 16)  Cipro 500 Mg Tabs (Ciprofloxacin hcl) .Marland Kitchen.. 1 am aand  pm 17)  Hydrocodone-acetaminophen 5-500 Mg Tabs (Hydrocodone-acetaminophen) .... One q.4 h. p.r.n. for pain we'll 4 per day  Patient Instructions: 1)  Alyssa Fernandez 70 year old female with some medical problems which are controlled. 2)  Short-term memory losses normal for people as they age at 2 hours is not extreme and there is no evidence of dementia nor Alzheimer's 3)  Continue your  regular medications 4)  Return as needed

## 2010-09-20 NOTE — Progress Notes (Signed)
Summary: Pt req med for bladder inf. To [facility], [place]  Phone Note Call from Patient Call back at [phone] cell   Caller: Patient Summary of Call: Pt called and said that she is out of town and has a bladder inf. Pt is req that Dr. Scotty Court call in med to [facility] [phone] Initial call taken by: Lucy Antigua,  Dec 21, 2009 8:23 AM  Follow-up for Phone Call        duplicate mess see mess with symptoms.  Follow-up by: Pura Spice, RN,  Dec 21, 2009 9:22 AM

## 2010-09-20 NOTE — Assessment & Plan Note (Signed)
Summary: pt will come in fasting/njr   Vital Signs:  Patient profile:   70 year old female Height:      62 inches Weight:      132 pounds O2 Sat:      97 % Temp:     98.3 degrees F Pulse rate:   88 / minute BP sitting:   128 / 80  (left arm)  Vitals Entered By: Pura Spice, RN (November 10, 2009 8:54 AM)  History of Present Illness: This 70 year old white married female is in to go over her medical problems and refill her no surgery medications. Her primary complaints are that of edema of the feet which has occurred over the past several weeks Also has been having epigastric pain as well as reflux symptoms at night In general has been feeling well Had Pap smear 2010 and the case G. this year 2011 Mammogram, bone density and colonoscopic exam up-to-date Blood pressure has been well controlled Her urinary urgency and incontinence has been well-controlled with her urological surgery by Dr. Retta Diones the patient has been able to feel some type of mass in the upper epigastrium and upper left quadrant of the abdomen and feel firm  EKG  Procedure date:  10/05/2009  Findings:       sinus rhythm with rate of:  94 possible old anterior infarct low QRS voltages in limb leads   Allergies: 1)  ! Sulfa 2)  ! Codeine  Past History:  Past Medical History: Last updated: 03/25/2007 Hyperlipidemia Hypertension polycystic kidney disease  Past Surgical History: Last updated: 08/26/2009 Nephrectomy  (partial left for angiomyolipoma Hysterectomy Cervical Disk Bunionectomy Fx RT Ankle LEFT TEMPORAL BX ARTERY --NEGATIVE 2  bladder operation, but per sling procedure  Past History:  Care Management: Urology:Dr. Dahlstedt  Review of Systems  The patient denies anorexia, fever, weight loss, weight gain, vision loss, decreased hearing, hoarseness, chest pain, syncope, dyspnea on exertion, peripheral edema, prolonged cough, headaches, hemoptysis, abdominal pain, melena, hematochezia,  severe indigestion/heartburn, hematuria, incontinence, genital sores, muscle weakness, suspicious skin lesions, transient blindness, difficulty walking, depression, unusual weight change, abnormal bleeding, enlarged lymph nodes, angioedema, breast masses, and testicular masses.    Physical Exam  General:  Well-developed,well-nourished,in no acute distress; alert,appropriate and cooperative throughout examination Head:  Normocephalic and atraumatic without obvious abnormalities. No apparent alopecia or balding. Eyes:  No corneal or conjunctival inflammation noted. EOMI. Perrla. Funduscopic exam benign, without hemorrhages, exudates or papilledema. Vision grossly normal. Ears:  External ear exam shows no significant lesions or deformities.  Otoscopic examination reveals clear canals, tympanic membranes are intact bilaterally without bulging, retraction, inflammation or discharge. Hearing is grossly normal bilaterally. Nose:  External nasal examination shows no deformity or inflammation. Nasal mucosa are pink and moist without lesions or exudates. Mouth:  Oral mucosa and oropharynx without lesions or exudates.  Teeth in good repair. Neck:  No deformities, masses, or tenderness noted. Chest Wall:  No deformities, masses, or tenderness noted. Breasts:  No mass, nodules, thickening, tenderness, bulging, retraction, inflamation, nipple discharge or skin changes noted.   Lungs:  Normal respiratory effort, chest expands symmetrically. Lungs are clear to auscultation, no crackles or wheezes. Heart:  Normal rate and regular rhythm. S1 and S2 normal without gallop, murmur, click, rub or other extra sounds. Abdomen:  is a firm bony feeling mass about 5 cm in the upper abdomen slightly tender on moving no other abnormalities of the abdomen Rectal:  No external abnormalities noted. Normal sphincter tone. No rectal masses  or tenderness. Genitalia:  negative exam good results from a small sliding no abnormalities  noted Msk:  No deformity or scoliosis noted of thoracic or lumbar spine.   Pulses:  R and L carotid,radial,femoral,dorsalis pedis and posterior tibial pulses are full and equal bilaterally Extremities:  No clubbing, cyanosis, edema, or deformity noted with normal full range of motion of all joints.   Neurologic:  No cranial nerve deficits noted. Station and gait are normal. Plantar reflexes are down-going bilaterally. DTRs are symmetrical throughout. Sensory, motor and coordinative functions appear intact. Skin:  several bruises over her forearms and body and legs from slight Cervical Nodes:  No lymphadenopathy noted Axillary Nodes:  No palpable lymphadenopathy Inguinal Nodes:  No significant adenopathy Psych:  Cognition and judgment appear intact. Alert and cooperative with normal attention span and concentration. No apparent delusions, illusions, hallucinations   Impression & Recommendations:  Problem # 1:  VASCULAR PURPURA (ICD-287.2) Assessment New vitamin C 500 mg b.i.d.  Problem # 2:  ABDOMINAL MASS, LEFT UPPER QUADRANT (NWG-956.21) Assessment: New  Orders: T-Abdomen 2-view (74020TC) T-2 View CXR (71020TC) Radiology Referral (Radiology)  Problem # 3:  URINARY FREQUENCY (ICD-788.41) Assessment: Improved  The following medications were removed from the medication list:    Vesicare 5 Mg Tabs (Solifenacin succinate) .Marland Kitchen... 1 qd  Orders: UA Dipstick w/o Micro (automated)  (81003)  Problem # 4:  HYPERTENSION, ESSENTIAL, UNCONTROLLED (ICD-401.9) Assessment: Improved  The following medications were removed from the medication list:    Metoprolol Succinate 200 Mg Tb24 (Metoprolol succinate) ..... Once daily    Lotrel 10-40 Mg Caps (Amlodipine besy-benazepril hcl) ..... Once daily    Clonidine Hcl 0.3 Mg Tabs (Clonidine hcl) .Marland Kitchen..Marland Kitchen Two times a day Her updated medication list for this problem includes:    Furosemide 40 Mg Tabs (Furosemide) ..... Once daily    Azor 10-40 Mg Tabs  (Amlodipine-olmesartan) .Marland Kitchen... 1 each day    Metoprolol Tartrate 50 Mg Tabs (Metoprolol tartrate) .Marland Kitchen... 1 tid  Orders: Venipuncture (30865) Prescription Created Electronically [phone])  Problem # 5:  POSTMENOPAUSAL SYNDROME (ICD-627.9) Assessment: Improved  Her updated medication list for this problem includes:    Premarin 0.625 Mg/gm Crea (Estrogens, conjugated) ..... Use as directed    Premarin 0.625 Mg Tabs (Estrogens conjugated) .Marland Kitchen... 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 Once daily  Orders: TLB-CBC Platelet - w/Differential (85025-CBCD)  Complete Medication List: 1)  Sertraline Hcl 100 Mg Tabs (Sertraline hcl) .Marland Kitchen.. 1 by mouth two times a day 2)  Klor-con M20 20 Meq Tbcr (Potassium chloride crys cr) .... Take 1 tablet two times a day 3)  Vytorin 10-40 Mg Tabs (Ezetimibe-simvastatin) .... Once daily 4)  Boniva 150 Mg Tabs (Ibandronate sodium) .... Take 1 monthly 5)  Furosemide 40 Mg Tabs (Furosemide) .... Once daily 6)  Dextroamphetamine Sulfate Cr 10 Mg Xr24h-cap (Dextroamphetamine sulfate) .Marland Kitchen.. 1 by mouth per day for narcolepsy 7)  Dextroamphetamine Sulfate Cr 10 Mg Xr24h-cap (Dextroamphetamine sulfate) .Marland Kitchen.. 1 by mouth two times a day if needed  fill Jun 27 2009 8)  Dextroamphetamine Sulfate Cr 10 Mg Xr24h-cap (Dextroamphetamine sulfate) .Marland Kitchen.. 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 fill Jul 27 2009 9)  Zegerid 40-1100 Mg Caps (  Omeprazole-sodium bicarbonate) .Marland Kitchen.. 1 once daily 10)  Azor 10-40 Mg Tabs (Amlodipine-olmesartan) .Marland Kitchen.. 1 each day 11)  Premarin 0.625 Mg/gm Crea (Estrogens, conjugated) .... Use as directed 12)  Metoprolol Tartrate 50 Mg Tabs (Metoprolol tartrate)  .Marland Kitchen.. 1 tid 13)  Premarin 0.625 Mg Tabs (Estrogens conjugated) .Marland Kitchen.. 1 qd 14)  Crestor 20 Mg Tabs (Rosuvastatin calcium) .Marland Kitchen.. 1 by mouth once daily  Other Orders: T-Vitamin D (25-Hydroxy) (04540-98119) TLB-Lipid Panel (80061-LIPID) TLB-BMP (Basic Metabolic Panel-BMET) (80048-METABOL) TLB-Hepatic/Liver Function Pnl (80076-HEPATIC) TLB-TSH (Thyroid Stimulating Hormone) (84443-TSH)  Patient Instructions: 1)  For  peripheral edema , take your furosemide plus potassium 2)  Bruising start Vit C 500mg  twice daily 3)  Will Xray chest and abdomen then possibly CTscan to explain  mass in upper abdomen 4)  will refill medications Prescriptions: PREMARIN 0.625 MG TABS (ESTROGENS CONJUGATED) 1 qd  #30 x 11   Entered and Authorized by:   Judithann Sheen MD   Signed by:   Judithann Sheen MD on 11/10/2009   Method used:   Electronically to        [facility]* (retail)       [address]       [place], [place]  [address]       Ph: [phone] or [phone]       Fax: [phone]   RxID:   [id number] PREMARIN 0.625 MG/GM CREA (ESTROGENS, CONJUGATED) use as directed  #1 x 11   Entered and Authorized by:   Judithann Sheen MD   Signed by:   Judithann Sheen MD on 11/10/2009   Method used:   Electronically to        [facility]* (retail)       [address]       [place], [place]  [address]       Ph: [phone] or [phone]       Fax: [phone]   RxID:   [id number] AZOR 10-40 MG TABS (AMLODIPINE-OLMESARTAN) 1 each day  #30 x 11   Entered and Authorized by:   Judithann Sheen MD   Signed by:   Judithann Sheen MD on 11/10/2009   Method used:   Electronically to        [facility] (retail)       [address]       [place], [place]  [address]       Ph: [phone] or [phone]       Fax: [phone]   RxID:   [id number] ZEGERID 40-1100 MG CAPS (OMEPRAZOLE-SODIUM BICARBONATE) 1 once daily  #30 x 11   Entered  and Authorized by:   Judithann Sheen MD   Signed by:   Judithann Sheen MD on 11/10/2009   Method used:   Electronically to        [facility] (retail)       [address]       [place], [place]  [address]       Ph: [phone] or [phone]       Fax: [phone]   RxID:   [id number] FUROSEMIDE 40 MG  TABS (FUROSEMIDE) once daily  #30 x 11   Entered and Authorized by:   Judithann Sheen MD   Signed by:   Judithann Sheen MD on 11/10/2009   Method used:   Electronically to        [facility] (retail)       [address]       [place], [place]  [address]       Ph: [phone] or [phone]       Fax: [phone]   RxID:   [id number] BONIVA 150 MG  TABS (IBANDRONATE SODIUM) take 1 monthly  #1 x 11   Entered and Authorized by:   Judithann Sheen MD   Signed by:   Judithann Sheen MD on 11/10/2009   Method used:   Electronically to        [facility] (retail)       [address]       [place], [place]  [address]       Ph: [phone] or [phone]       Fax: [phone]   RxID:   [id number] VYTORIN 10-40 MG  TABS (EZETIMIBE-SIMVASTATIN) once daily  #30 x 11   Entered and Authorized by:   Judithann Sheen MD   Signed by:   Judithann Sheen MD on 11/10/2009   Method used:   Electronically to        [facility] (retail)       [address]       [place], [place]  [address]       Ph: [phone] or [phone]       Fax: [phone]   RxID:   [id number] METOPROLOL TARTRATE 50 MG TABS (METOPROLOL TARTRATE) 1 tid  #150 x 11   Entered and Authorized by:   Judithann Sheen MD   Signed by:   Judithann Sheen MD on 11/10/2009   Method used:   Electronically to        [facility] (retail)       [address]       [place], [place]  [address]       Ph: [phone] or [phone]       Fax: [phone]   RxID:   [id number] SERTRALINE HCL 100 MG  TABS  (SERTRALINE HCL) 1 by mouth two times a day  #60 x 11   Entered and Authorized by:   Judithann Sheen MD   Signed by:   Judithann Sheen MD on 11/10/2009   Method used:   Electronically to        [facility] (retail)       [address]       [place], [place]  [address]       Ph: [phone] or [phone]       Fax: [phone]   RxID:   [id number]    Laboratory Results   Urine Tests    Routine Urinalysis   Color: yellow Appearance: Clear Glucose: negative   (Normal Range: Negative) Bilirubin: negative   (Normal Range: Negative) Ketone: negative   (Normal Range: Negative) Spec. Gravity: 1.015   (Normal Range: 1.003-1.035) Blood: negative   (Normal Range: Negative) pH: 7.5   (Normal Range: 5.0-8.0) Protein: negative   (Normal Range: Negative) Urobilinogen: 0.2   (Normal Range: 0-1) Nitrite: negative   (Normal Range: Negative) Leukocyte Esterace: trace   (Normal Range: Negative)    Comments: Rita Ohara  November 10, 2009 10:38 AM

## 2010-09-20 NOTE — Progress Notes (Signed)
Summary: refill on hydrocodone  Phone Note From Pharmacy   Caller: [facility] Reason for Call: Needs renewal Details for Reason: hydrocodone/apap 5/500 Summary of Call: last filled on 08/03/2009 #120 Initial call taken by: Romualdo Bolk, CMA (AAMA),  January 31, 2010 11:09 AM  Follow-up for Phone Call        refill hydrocodone Follow-up by: Judithann Sheen MD,  January 31, 2010 5:47 PM

## 2010-09-20 NOTE — Letter (Signed)
Summary: [facility]   Imported By: Maryln Gottron 08/26/2009 10:08:02  _____________________________________________________________________  External Attachment:    Type:   Image     Comment:   External Document

## 2010-09-20 NOTE — Progress Notes (Signed)
Summary: ?????CT scan results  Phone Note Call from Patient Call back at [facility] Phone [phone] Call back at Work Phone [phone]   Summary of Call: She did not understand Dr. Charmian Muff message about her CT scan, something about her kidneys, her problem is she thinks a hernia up close to kidney area.  Does she need to do anything? Initial call taken by: Rudy Jew, RN,  November 18, 2009 9:38 AM  Follow-up for Phone Call        called by dr Scotty Court and new rx for crestroor 20 mg  Follow-up by: Pura Spice, RN,  November 18, 2009 3:53 PM    New/Updated Medications: CRESTOR 20 MG TABS (ROSUVASTATIN CALCIUM) 1 by mouth once daily Prescriptions: CRESTOR 20 MG TABS (ROSUVASTATIN CALCIUM) 1 by mouth once daily  #30 x 11   Entered by:   Pura Spice, RN   Authorized by:   Judithann Sheen MD   Signed by:   Pura Spice, RN on 11/18/2009   Method used:   Print then Give to Patient   RxID:   [id number]

## 2010-09-20 NOTE — Progress Notes (Signed)
Summary: pulse in 90-100 range continually  Phone Note Other Incoming   Caller: Patient at window  Summary of Call: States she is here to get her pulse checked. Knows that Dr. Scotty Court is not here today.  Pulse has been running in the 90-100 range.  Today 114/66 P100 and regular.  No short breath or chest pain.  Has " severe pain" from her 3 discs with spurs in upper back that she saw Dr. Darrelyn Hillock for Sat.   Advised workin appointment today with Dr. Demetrius Charity or Dr. Carmon Ginsberg or tomorrow with Dr. Satira Sark, which patient says she will see Dr. Scotty Court tomorrow.  Advised rest, no exertion this pm,  no caffeine, decrease stress, call back prn or ER for any change or increase in pulse/symptoms meanwhile.  Says she does feel nervous at times & wonders if this could be her heart.  OV Dr. Scotty Court 10-05-09 11:30 for P100.    Initial call taken by: Rudy Jew, RN,  October 04, 2009 12:05 PM

## 2010-09-20 NOTE — Assessment & Plan Note (Signed)
Summary: SWELLING IN ANKLES/CJR   Vital Signs:  Patient profile:   70 year old female Weight:      135 pounds BMI:     24.78 O2 Sat:      94 % Temp:     98.4 degrees F Pulse rate:   85 / minute BP sitting:   110 / 78  (left arm)  Vitals Entered By: Pura Spice, RN (Dec 28, 2009 11:14 AM) CC: EDEMA ANKLES NEEDS AZOR SWITCHED TO GENERIC.  WALKING 3 MILES QD    History of Present Illness: This 70 year old white married female with known hypertension which is well-controlled his in today complaining of edema of the ankles and feet but not severe, and desires treatment or has not been taking furosemide as instructed previously Insertional mild place for Azor and desires to have it changed to something generic Since last visit we discussed exercise and has been walking 3 miles per day Her urine or problems improved and under good control Discuss home of her emotional stresses which primarily involved her husband other stresses       GERD has been well-controlled since change in treatment due to i Harris at change of Dr. Minerva Ends to a generic and will increase dose to an 81 g h.s.  Allergies: 1)  ! Sulfa 2)  ! Codeine  Past History:  Past Medical History: Last updated: 03/25/2007 Hyperlipidemia Hypertension polycystic kidney disease  Past Surgical History: Last updated: 08/26/2009 Nephrectomy  (partial left for angiomyolipoma Hysterectomy Cervical Disk Bunionectomy Fx RT Ankle LEFT TEMPORAL BX ARTERY --NEGATIVE 2  bladder operation, but per sling procedure  Review of Systems  The patient denies anorexia, fever, weight loss, weight gain, vision loss, decreased hearing, hoarseness, chest pain, syncope, dyspnea on exertion, peripheral edema, prolonged cough, headaches, hemoptysis, abdominal pain, melena, hematochezia, severe indigestion/heartburn, hematuria, incontinence, genital sores, muscle weakness, suspicious skin lesions, transient blindness, difficulty walking,  depression, unusual weight change, abnormal bleeding, enlarged lymph nodes, angioedema, breast masses, and testicular masses.    Physical Exam  General:  Well-developed,well-nourished,in no acute distress; alert,appropriate and cooperative throughout examination Lungs:  Normal respiratory effort, chest expands symmetrically. Lungs are clear to auscultation, no crackles or wheezes. Heart:  Normal rate and regular rhythm. S1 and S2 normal without gallop, murmur, click, rub or other extra sounds. Abdomen:  Bowel sounds positive,abdomen soft and non-tender without masses, organomegaly or hernias noted. Extremities:  1+ pretibial edema laterally2+ left pedal edema, trace right pedal edema, and 2+ right pedal edema.     Impression & Recommendations:  Problem # 1:  PERIPHERAL EDEMA (ICD-782.3) Assessment New  Her updated medication list for this problem includes:    Furosemide 40 Mg Tabs (Furosemide) ..... Once daily  Problem # 2:  HYPERTENSION, ESSENTIAL, UNCONTROLLED (ICD-401.9) Assessment: Improved  The following medications were removed from the medication list:    Azor 10-40 Mg Tabs (Amlodipine-olmesartan) .Marland Kitchen... 1 each day    Metoprolol Tartrate 50 Mg Tabs (Metoprolol tartrate) .Marland Kitchen... 1 tid Her updated medication list for this problem includes:    Furosemide 40 Mg Tabs (Furosemide) ..... Once daily    Amlodipine Besylate 10 Mg Tabs (Amlodipine besylate) .Marland Kitchen... 1 each day for hypertension    Lisinopril 20 Mg Tabs (Lisinopril) .Marland Kitchen... 1 each day for edema and blood pressure and kidney protection    Metoprolol Tartrate 100 Mg Tabs (Metoprolol tartrate) .Marland Kitchen... 1 two times a day  for bp and heart rate  Problem # 3:  POSTMENOPAUSAL SYNDROME (ICD-627.9) Assessment: Improved  Her updated medication list for this problem includes:    Premarin 0.625 Mg/gm Crea (Estrogens, conjugated) ..... Use as directed    Premarin 0.625 Mg Tabs (Estrogens conjugated) .Marland Kitchen... 1 qd  Problem # 4:  INSOMNIA  (ICD-780.52) Assessment: Improved  Problem # 5:  RENAL FAILURE (ICD-586) Assessment: Improved  Problem # 6:  GERD (ICD-530.81) Assessment: Improved  Her updated medication list for this problem includes:    Zegerid 40-1100 Mg Caps (Omeprazole-sodium bicarbonate) .Marland Kitchen... 1 once daily  Problem # 7:  HYPERLIPIDEMIA (ICD-272.4) Assessment: Improved  The following medications were removed from the medication list:    Vytorin 10-40 Mg Tabs (Ezetimibe-simvastatin) ..... Once daily    Crestor 20 Mg Tabs (Rosuvastatin calcium) .Marland Kitchen... 1 by mouth once daily Her updated medication list for this problem includes:    Simvastatin 80 Mg Tabs (Simvastatin) .Marland Kitchen... 1 once daily for hyperccholesteremia  Orders: Prescription Created Electronically [id number])  Complete Medication List: 1)  Sertraline Hcl 100 Mg Tabs (Sertraline hcl) .Marland Kitchen.. 1 by mouth two times a day 2)  Klor-con M20 20 Meq Tbcr (Potassium chloride crys cr) .... Take 1 tablet two times a day 3)  Boniva 150 Mg Tabs (Ibandronate sodium) .... Take 1 monthly 4)  Furosemide 40 Mg Tabs (Furosemide) .... Once daily 5)  Dextroamphetamine Sulfate Cr 10 Mg Xr24h-cap (Dextroamphetamine sulfate) .Marland Kitchen.. 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 1 by mouth two times a day if needed  fill Jun 27 2009 7)  Dextroamphetamine Sulfate Cr 10 Mg Xr24h-cap (Dextroamphetamine sulfate) .Marland Kitchen.. 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 fill Jul 27 2009 8)  Zegerid 40-1100 Mg Caps (Omeprazole-sodium bicarbonate) .Marland Kitchen.. 1 once daily 9)  Premarin 0.625 Mg/gm Crea (Estrogens, conjugated) .... Use as directed 10)  Premarin 0.625 Mg Tabs (Estrogens conjugated) .Marland Kitchen.. 1 qd 11)  Macrobid 100 Mg Caps (Nitrofurantoin monohyd macro) .Marland Kitchen.. 1 by mouth two times a day 12)  Simvastatin 80 Mg Tabs (Simvastatin) .Marland Kitchen.. 1 once daily for hyperccholesteremia 13)  Amlodipine Besylate 10 Mg Tabs (Amlodipine besylate)  .Marland Kitchen.. 1 each day for hypertension 14)  Lisinopril 20 Mg Tabs (Lisinopril) .Marland Kitchen.. 1 each day for edema and blood pressure and kidney protection 15)  Metoprolol Tartrate 100 Mg Tabs (Metoprolol tartrate) .Marland Kitchen.. 1 two times a day  for bp and heart rate  Patient Instructions: 1)  F. peripheral edema restart furosemide 4 mg the next day until edema is calm and then take as needed number continue low sodium diet 2)  Changed Azore amlodipine plus lisinopril 3)  Discontinue Vytorin and start simvastatin 80 mg at bedtime 4)  Continue to walk 3 miles a day at least 3 days per week Prescriptions: METOPROLOL TARTRATE 100 MG TABS (METOPROLOL TARTRATE) 1 two times a day  for BP and heart rate  #60 x 11   Entered and Authorized by:   Judithann Sheen MD   Signed by:   Judithann Sheen MD on 12/28/2009   Method used:   Electronically to        [facility] (retail)       [address]       [place], [place]  [address]       Ph: [phone] or [phone]       Fax: [phone]   RxID:   [id number] LISINOPRIL 20 MG TABS (LISINOPRIL) 1 each day for edema and blood pressure and kidney protection  #  30 x 11   Entered and Authorized by:   Judithann Sheen MD   Signed by:   Judithann Sheen MD on 12/28/2009   Method used:   Electronically to        [facility] (retail)       [address]       [place], [place]  [address]       Ph: [phone] or [phone]       Fax: [phone]   RxID:   [id number] AMLODIPINE BESYLATE 10 MG TABS (AMLODIPINE BESYLATE) 1 each day for hypertension  #30 x 11   Entered and Authorized by:   Judithann Sheen MD   Signed by:   Judithann Sheen MD on 12/28/2009   Method used:   Electronically to        [facility] (retail)       [address]       [place], [place]  [address]       Ph: [phone] or [phone]       Fax: [phone]   RxID:   [id number] SIMVASTATIN 80 MG TABS (SIMVASTATIN) 1 once  daily for hyperccholesteremia  #30 x 11   Entered and Authorized by:   Judithann Sheen MD   Signed by:   Judithann Sheen MD on 12/28/2009   Method used:   Electronically to        [facility] (retail)       [address]       [place], [place]  [address]       Ph: [phone] or [phone]       Fax: [phone]   RxID:   [id number]

## 2010-09-20 NOTE — Assessment & Plan Note (Signed)
Summary: FU ON HEART RATE/NJR   Vital Signs:  Patient profile:   70 year old female Weight:      128 pounds BMI:     22.76 O2 Sat:      95 % on Room air Temp:     98.1 degrees F Pulse rate:   108 / minute BP sitting:   120 / 84  (left arm)  Vitals Entered By: Pura Spice, RN (October 05, 2009 11:46 AM)  O2 Flow:  Room air CC: pt concerned about heart rate running over 100 .states dr Valente David has her on decreaseing pack of prednisone.  Is Patient Diabetic? No   Allergies: 1)  ! Sulfa 2)  ! Codeine   Complete Medication List: 1)  Sertraline Hcl 100 Mg Tabs (Sertraline hcl) .Marland Kitchen.. 1 by mouth two times a day 2)  Metoprolol Succinate 200 Mg Tb24 (Metoprolol succinate) .... Once daily 3)  Klor-con M20 20 Meq Tbcr (Potassium chloride crys cr) .... Take 1 tablet two times a day 4)  Alprazolam 0.5 Mg Tabs (Alprazolam) .... Three times a day as needed  fo stress 5)  Vytorin 10-40 Mg Tabs (Ezetimibe-simvastatin) .... Once daily 6)  Lotrel 10-40 Mg Caps (Amlodipine besy-benazepril hcl) .... Once daily 7)  Boniva 150 Mg Tabs (Ibandronate sodium) .... Take 1 monthly 8)  Furosemide 40 Mg Tabs (Furosemide) .... Once daily 9)  Clonidine Hcl 0.3 Mg Tabs (Clonidine hcl) .... Two times a day 10)  Zolpidem Tartrate 10 Mg Tabs (Zolpidem tartrate) .Marland Kitchen.. 1 at bedtime as needed for sleep 11)  Hydrocodone-acetaminophen 5-500 Mg Tabs (Hydrocodone-acetaminophen) .... Take one tablet every 4 hrs as needed pain  maximum of 4 tablets per day 12)  Protonix 40 Mg Tbec (Pantoprazole sodium) .Marland Kitchen.. 1 by mouth once daily. 13)  Dextroamphetamine Sulfate Cr 10 Mg Xr24h-cap (Dextroamphetamine sulfate) .Marland Kitchen.. 1 by mouth two times a day as needed  fill for May 27 2009 14)  Dextroamphetamine Sulfate Cr 10 Mg Xr24h-cap (Dextroamphetamine sulfate) .Marland Kitchen.. 1 by mouth two times a day if needed  fill Jun 27 2009 15)  Dextroamphetamine Sulfate Cr 10 Mg Xr24h-cap (Dextroamphetamine sulfate) .Marland Kitchen.. 1 by mouth two times a day if needed  for narcoplepsy. fill Jul 27 2009 16)  Vesicare 5 Mg Tabs (Solifenacin succinate) .Marland Kitchen.. 1 qd 17)  Zegerid 40-1100 Mg Caps (Omeprazole-sodium bicarbonate) .Marland Kitchen.. 1 once daily 18)  Premarin 0.45 Mg Tabs (Estrogens conjugated) .Marland Kitchen.. 1 qd 19)  Azor 10-40 Mg Tabs (Amlodipine-olmesartan) .Marland Kitchen.. 1 each day 20)  Tobradex St 0.3-0.05 % Susp (Tobramycin-dexamethasone) .Marland Kitchen.. 1 qtt in left eye three times a day, 1 qtt in rt eye hs for conjunctivitis  Other Orders: EKG w/ Interpretation (93000)  Appended Document: CHF Office Visit     Vital Signs:  Patient profile:   70 year old female Weight:      128 pounds O2 Sat:      95 % Temp:     98.1 degrees F Pulse rate:   108 / minute Pulse rhythm:   regular Resp:     16 per minute BP sitting:   120 / 84 Cuff size:   regular  History of Present Illness: this 70 year old white female who has had some problem with her blood pressure being rather labile but controlled recently has noticed that she has had a cardiac rate of 108 or at least 100 good deal of time Blood pressure 120/84 Patient relates she had been on prednisone orthopedic problem treated by Dr.Giofre EKG  reveals no abnormality including pulse rate of 84  Allergies: 1)  ! Sulfa 2)  ! Codeine  Past History:  Past Medical History: Last updated: 03/25/2007 Hyperlipidemia Hypertension polycystic kidney disease  Past Surgical History: Last updated: 08/26/2009 Nephrectomy  (partial left for angiomyolipoma Hysterectomy Cervical Disk Bunionectomy Fx RT Ankle LEFT TEMPORAL BX ARTERY --NEGATIVE 2  bladder operation, but per sling procedure  Physical Exam  General:  Well-developed,well-nourished,in no acute distress; alert,appropriate and cooperative throughout examination Lungs:  Normal respiratory effort, chest expands symmetrically. Lungs are clear to auscultation, no crackles or wheezes. Heart:  Normal rate and regular rhythm. S1 and S2 normal without gallop, murmur, click, rub or  other extra sounds.rate of 90 Abdomen:  Bowel sounds positive,abdomen soft and non-tender without masses, organomegaly or hernias noted. Extremities:  No clubbing, cyanosis, edema, or deformity noted with normal full range of motion of all joints.     Impression & Recommendations:  Problem # 1:  TACHYCARDIA (ICD-785.0) Assessment Improved  Problem # 2:  HYPERTENSION, ESSENTIAL, UNCONTROLLED (ICD-401.9) Assessment: Improved  Her updated medication list for this problem includes:    Metoprolol Succinate 200 Mg Tb24 (Metoprolol succinate) ..... Once daily    Lotrel 10-40 Mg Caps (Amlodipine besy-benazepril hcl) ..... Once daily    Furosemide 40 Mg Tabs (Furosemide) ..... Once daily    Clonidine Hcl 0.3 Mg Tabs (Clonidine hcl) .Marland Kitchen..Marland Kitchen Two times a day    Azor 10-40 Mg Tabs (Amlodipine-olmesartan) .Marland Kitchen... 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 1 by mouth once daily.    Zegerid 40-1100 Mg Caps (Omeprazole-sodium bicarbonate) .Marland Kitchen... 1 once daily  Complete Medication List: 1)  Sertraline Hcl 100 Mg Tabs (Sertraline hcl) .Marland Kitchen.. 1 by mouth two times a day 2)  Metoprolol Succinate 200 Mg Tb24 (Metoprolol succinate) .... Once daily 3)  Klor-con M20 20 Meq Tbcr (Potassium chloride crys cr) .... Take 1 tablet two times a day 4)  Alprazolam 0.5 Mg Tabs (Alprazolam) .... Three times a day as needed  fo stress 5)  Vytorin 10-40 Mg Tabs (Ezetimibe-simvastatin) .... Once daily 6)  Lotrel 10-40 Mg Caps (Amlodipine besy-benazepril hcl) .... Once daily 7)  Boniva 150 Mg Tabs (Ibandronate sodium) .... Take 1 monthly 8)  Furosemide 40 Mg Tabs (Furosemide) ....  Once daily 9)  Clonidine Hcl 0.3 Mg Tabs (Clonidine hcl) .... Two times a day 10)  Zolpidem Tartrate 10 Mg Tabs (Zolpidem tartrate) .Marland Kitchen.. 1 at bedtime as needed for sleep 11)  Hydrocodone-acetaminophen 5-500 Mg Tabs (Hydrocodone-acetaminophen) .... Take one tablet every 4 hrs as needed pain  maximum of 4 tablets per day 12)  Protonix 40 Mg Tbec (Pantoprazole sodium) .Marland Kitchen.. 1 by mouth once daily. 13)  Dextroamphetamine Sulfate Cr 10 Mg Xr24h-cap (Dextroamphetamine sulfate) .Marland Kitchen.. 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 1 by mouth two times a day if needed  fill Jun 27 2009 15)  Dextroamphetamine Sulfate Cr 10 Mg Xr24h-cap (Dextroamphetamine sulfate) .Marland Kitchen.. 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 fill Jul 27 2009 16)  Vesicare 5 Mg Tabs (Solifenacin succinate) .Marland Kitchen.. 1 qd 17)  Zegerid 40-1100  Mg Caps (Omeprazole-sodium bicarbonate) .Marland Kitchen.. 1 once daily 18)  Azor 10-40 Mg Tabs (Amlodipine-olmesartan) .Marland Kitchen.. 1 each day 19)  Tobradex St 0.3-0.05 % Susp (Tobramycin-dexamethasone) .Marland Kitchen.. 1 qtt in left eye three times a day, 1 qtt in rt eye hs for conjunctivitis 20)  Premarin 0.625 Mg/gm Crea (Estrogens, conjugated) .... Use as directed  Patient Instructions: 1)  it is most likely that the prednisone has increased her pulse rate but it has not been increased sensation enough to alter your other medicines keep a record over the next 5-7 days and notify me if it increases greater than 110 2)  blood pressure is doing her well at this time 3)  Continue medications as prescribed

## 2010-09-20 NOTE — Progress Notes (Signed)
Summary: Dextroamphetamine refill  Phone Note Refill Request Message from:  Patient on July 07, 2010 12:23 PM  Refills Requested: Medication #1:  DEXTROAMPHETAMINE SULFATE CR 10 MG XR24H-CAP 1 by mouth per day for narcolepsy   Dosage confirmed as above?Dosage Confirmed Please advise refill? Pt sent in a med list stating this needs refilled in order to drive  Initial call taken by: Josph Macho RMA,  July 07, 2010 12:24 PM  Follow-up for Phone Call        [facility] to refill with same sig and #30, 0 rf Follow-up by: Danise Edge MD,  July 07, 2010 1:05 PM  Additional Follow-up for Phone Call Additional follow up Details #1::        Left a message for pt to return my call. RX in black file to put in front cabinet. Additional Follow-up by: Josph Macho RMA,  July 08, 2010 11:26 AM    Prescriptions: DEXTROAMPHETAMINE SULFATE CR 10 MG XR24H-CAP (DEXTROAMPHETAMINE SULFATE) 1 by mouth per day for narcolepsy  #30 x 0   Entered by:   Josph Macho RMA   Authorized by:   Danise Edge MD   Signed by:   Josph Macho RMA on 07/08/2010   Method used:   Print then Give to Patient   RxID:   [id number]

## 2010-09-22 NOTE — Progress Notes (Signed)
Summary: refill alprazolam  Phone Note From Pharmacy   Caller: [facility] Reason for Call: Needs renewal Details for Reason: Alprazolam 0.5mg  Initial call taken by: Romualdo Bolk, CMA Duncan Dull),  August 30, 2010 1:06 PM  Follow-up for Phone Call        Per Dr. Scotty Court- ok x 5 Rx faxed to pharmacy Follow-up by: Romualdo Bolk, CMA (AAMA),  August 30, 2010 1:16 PM  Additional Follow-up for Phone Call Additional follow up Details #1::        will refill apazolam Additional Follow-up by: Judithann Sheen MD,  September 01, 2010 9:47 AM    New/Updated Medications: ALPRAZOLAM 0.5 MG TABS (ALPRAZOLAM) 1 by mouth three times a day as needed for stress Prescriptions: ALPRAZOLAM 0.5 MG TABS (ALPRAZOLAM) 1 by mouth three times a day as needed for stress  #90 x 4   Entered by:   Romualdo Bolk, CMA (AAMA)   Authorized by:   Judithann Sheen MD   Signed by:   Romualdo Bolk, CMA (AAMA) on 08/30/2010   Method used:   Handwritten   RxID:   [id number]

## 2010-09-22 NOTE — Letter (Signed)
Summary: [facility]   Imported By: Maryln Gottron 09/06/2010 09:53:20  _____________________________________________________________________  External Attachment:    Type:   Image     Comment:   External Document

## 2010-09-22 NOTE — Progress Notes (Signed)
Summary: Pt needs to second opion from Dr Scotty Court re: surgery  Phone Note Call from Patient Call back at Home Phone [phone]   Caller: Patient Summary of Call: Pt called and said that she really needs some advise from Dr. Scotty Court re: surgery that [facility]. She said that they are wanting to remove 2 of her toes. Pt wants to get second opinion. Pls call pt asap.  Initial call taken by: Lucy Antigua,  August 29, 2010 12:07 PM  Follow-up for Phone Call        discussed with pt Follow-up by: Judithann Sheen MD,  September 01, 2010 9:48 AM

## 2010-09-28 NOTE — Letter (Signed)
Summary: [facility]   Imported By: Maryln Gottron 09/19/2010 10:06:24  _____________________________________________________________________  External Attachment:    Type:   Image     Comment:   External Document

## 2010-10-25 ENCOUNTER — Ambulatory Visit (INDEPENDENT_AMBULATORY_CARE_PROVIDER_SITE_OTHER): Payer: Medicare Other | Admitting: Family Medicine

## 2010-10-25 ENCOUNTER — Encounter: Payer: Self-pay | Admitting: Family Medicine

## 2010-10-25 VITALS — BP 160/90 | HR 79 | Temp 98.4°F | Wt 136.0 lb

## 2010-10-25 DIAGNOSIS — F341 Dysthymic disorder: Secondary | ICD-10-CM

## 2010-10-25 DIAGNOSIS — Z780 Asymptomatic menopausal state: Secondary | ICD-10-CM

## 2010-10-25 DIAGNOSIS — I10 Essential (primary) hypertension: Secondary | ICD-10-CM

## 2010-10-25 DIAGNOSIS — F329 Major depressive disorder, single episode, unspecified: Secondary | ICD-10-CM

## 2010-10-25 MED ORDER — CITALOPRAM HYDROBROMIDE 20 MG PO TABS: 20.0000 mg | ORAL_TABLET | Freq: Every day | ORAL | Status: DC

## 2010-10-25 NOTE — Patient Instructions (Addendum)
You are depressed and need to add to sertaline 100mg  twice daily , add celexa 20 mg each day for 1 month and then we will talk as to what medications we need,also continue alprazolam on a regular basis Continue other medications

## 2010-11-02 NOTE — Progress Notes (Signed)
  Subjective:    Patient ID: Alyssa Fernandez, female    DOB: Feb 27, 1941, 70 y.o.   MRN: [id number] This 70 year old white married female is in today complaining of mood In the [place] area depressed having a hard time focusing on anything, has been doing some genealogy and wrote a book and since that time it  Past been more depressed.she has been staying in bed a good bit of time with no desire to do anything she has been on Zoloft 100 mg b.i.d. But has become depressed despite that . We discussed the addition of Celexa 20 mg a day as well as taking her alprazolam on a regular basis. She has no other symptoms and no other complaint was a pressure of a problem in the past but is controlled at this time 140/84 after an initial 160/80 We spent a good 30-40 minutes discussing her life and her problems of which he has some marital discordpatient was repleted with both Dr. Lestine Box removal of a second toe on right foot and has done wellHPI    Review of Systems Review of systems negative other than in history of present illness    Objective:   Physical Examthe patient is well well well-nourished white female who does appear to be somewhat depressed but in no distress Examination of the heart lung negative no edema       Assessment & Plan:  The patient is depressed and plans are to decrease the Zoloft to 100 mg each day add Celexa 20 mg each day and continue alprazolam on a regular basis patient to call or return in one month

## 2010-11-23 LAB — POCT I-STAT 4, (NA,K, GLUC, HGB,HCT)
Glucose, Bld: 100 mg/dL — ABNORMAL HIGH (ref 70–99)
HCT: 45 % (ref 36.0–46.0)
Potassium: 4 mEq/L (ref 3.5–5.1)
Sodium: 138 mEq/L (ref 135–145)

## 2010-12-03 ENCOUNTER — Emergency Department (HOSPITAL_COMMUNITY)
Admission: EM | Admit: 2010-12-03 | Discharge: 2010-12-03 | Disposition: A | Discharge status: Home / Self Care | Payer: Medicare Other | Attending: Emergency Medicine | Admitting: Emergency Medicine

## 2010-12-03 ENCOUNTER — Emergency Department (HOSPITAL_COMMUNITY): Payer: Medicare Other

## 2010-12-03 DIAGNOSIS — S01409A Unspecified open wound of unspecified cheek and temporomandibular area, initial encounter: Secondary | ICD-10-CM | POA: Insufficient documentation

## 2010-12-03 DIAGNOSIS — S0003XA Contusion of scalp, initial encounter: Secondary | ICD-10-CM | POA: Insufficient documentation

## 2010-12-03 DIAGNOSIS — M542 Cervicalgia: Secondary | ICD-10-CM | POA: Insufficient documentation

## 2010-12-03 DIAGNOSIS — R51 Headache: Secondary | ICD-10-CM | POA: Insufficient documentation

## 2010-12-03 DIAGNOSIS — E7800 Pure hypercholesterolemia, unspecified: Secondary | ICD-10-CM | POA: Insufficient documentation

## 2010-12-03 DIAGNOSIS — I10 Essential (primary) hypertension: Secondary | ICD-10-CM | POA: Insufficient documentation

## 2010-12-03 DIAGNOSIS — W1809XA Striking against other object with subsequent fall, initial encounter: Secondary | ICD-10-CM | POA: Insufficient documentation

## 2010-12-03 DIAGNOSIS — Y92009 Unspecified place in unspecified non-institutional (private) residence as the place of occurrence of the external cause: Secondary | ICD-10-CM | POA: Insufficient documentation

## 2010-12-03 DIAGNOSIS — F3289 Other specified depressive episodes: Secondary | ICD-10-CM | POA: Insufficient documentation

## 2010-12-03 DIAGNOSIS — F329 Major depressive disorder, single episode, unspecified: Secondary | ICD-10-CM | POA: Insufficient documentation

## 2010-12-03 DIAGNOSIS — S0180XA Unspecified open wound of other part of head, initial encounter: Secondary | ICD-10-CM | POA: Insufficient documentation

## 2010-12-03 DIAGNOSIS — S0083XA Contusion of other part of head, initial encounter: Secondary | ICD-10-CM | POA: Insufficient documentation

## 2010-12-07 ENCOUNTER — Encounter: Payer: Self-pay | Admitting: Family Medicine

## 2010-12-07 ENCOUNTER — Ambulatory Visit (INDEPENDENT_AMBULATORY_CARE_PROVIDER_SITE_OTHER): Payer: Medicare Other | Admitting: Family Medicine

## 2010-12-07 VITALS — BP 130/80 | HR 70 | Temp 98.6°F | Resp 16

## 2010-12-07 DIAGNOSIS — T148XXA Other injury of unspecified body region, initial encounter: Secondary | ICD-10-CM

## 2010-12-07 DIAGNOSIS — S0180XA Unspecified open wound of other part of head, initial encounter: Secondary | ICD-10-CM

## 2010-12-07 DIAGNOSIS — S0181XA Laceration without foreign body of other part of head, initial encounter: Secondary | ICD-10-CM

## 2010-12-07 DIAGNOSIS — I10 Essential (primary) hypertension: Secondary | ICD-10-CM

## 2010-12-07 DIAGNOSIS — T07XXXA Unspecified multiple injuries, initial encounter: Secondary | ICD-10-CM

## 2010-12-07 NOTE — Progress Notes (Signed)
  Subjective:    Patient ID: Alyssa Fernandez, female    DOB: 05-30-1941, 70 y.o.   MRN: [id number] this 70 year old patient white female fallen 5 days ago causing contusion and lacerations over right side of her face and went to the emergency room at [facility] per was taking care of. She had a CT scan which was negativeHPI lacerations were sutured She is in today to followup regarding evaluation of possible infection   Review of Systemsreview of systems negative blood pressure controlled     Objective:   Physical Exam the patient is alert cooperative and has obvious injuries to the face  There are 3 lacerations which have been sutured there are several areas of purpura evident hematoma No evidence of erythema or drainage        Assessment & Plan:  Lacerations of face Healing nicely Contusions of face continue hot soaks as prescribed Hypertension well controlled

## 2010-12-07 NOTE — Patient Instructions (Signed)
Laderations doing well lacerations are healing with no evidence of infection Return if needed in 2 days or anytime

## 2010-12-08 ENCOUNTER — Ambulatory Visit: Payer: BC Managed Care – PPO | Admitting: Internal Medicine

## 2010-12-10 ENCOUNTER — Other Ambulatory Visit: Payer: Self-pay | Admitting: Family Medicine

## 2010-12-20 ENCOUNTER — Other Ambulatory Visit: Payer: Self-pay | Admitting: Family Medicine

## 2010-12-28 ENCOUNTER — Ambulatory Visit (INDEPENDENT_AMBULATORY_CARE_PROVIDER_SITE_OTHER): Payer: Medicare Other | Admitting: Family Medicine

## 2010-12-28 ENCOUNTER — Encounter: Payer: Self-pay | Admitting: Family Medicine

## 2010-12-28 ENCOUNTER — Other Ambulatory Visit (HOSPITAL_COMMUNITY)
Admission: RE | Admit: 2010-12-28 | Discharge: 2010-12-28 | Disposition: A | Discharge status: Home / Self Care | Payer: Medicare Other | Source: Ambulatory Visit | Attending: Family Medicine | Admitting: Family Medicine

## 2010-12-28 VITALS — BP 122/82 | HR 55 | Temp 98.1°F | Ht 61.75 in | Wt 135.0 lb

## 2010-12-28 DIAGNOSIS — G4700 Insomnia, unspecified: Secondary | ICD-10-CM

## 2010-12-28 DIAGNOSIS — F341 Dysthymic disorder: Secondary | ICD-10-CM

## 2010-12-28 DIAGNOSIS — E559 Vitamin D deficiency, unspecified: Secondary | ICD-10-CM

## 2010-12-28 DIAGNOSIS — Z124 Encounter for screening for malignant neoplasm of cervix: Secondary | ICD-10-CM | POA: Insufficient documentation

## 2010-12-28 DIAGNOSIS — N951 Menopausal and female climacteric states: Secondary | ICD-10-CM

## 2010-12-28 DIAGNOSIS — E039 Hypothyroidism, unspecified: Secondary | ICD-10-CM

## 2010-12-28 DIAGNOSIS — I10 Essential (primary) hypertension: Secondary | ICD-10-CM

## 2010-12-28 DIAGNOSIS — R350 Frequency of micturition: Secondary | ICD-10-CM

## 2010-12-28 DIAGNOSIS — F329 Major depressive disorder, single episode, unspecified: Secondary | ICD-10-CM

## 2010-12-28 DIAGNOSIS — D649 Anemia, unspecified: Secondary | ICD-10-CM

## 2010-12-28 DIAGNOSIS — Z0000 Encounter for general adult medical examination without abnormal findings: Secondary | ICD-10-CM

## 2010-12-28 DIAGNOSIS — E785 Hyperlipidemia, unspecified: Secondary | ICD-10-CM

## 2010-12-28 LAB — POCT URINALYSIS DIPSTICK
Glucose, UA: NEGATIVE
Nitrite, UA: NEGATIVE
Spec Grav, UA: 1.015
Urobilinogen, UA: 0.2

## 2010-12-28 LAB — HEPATIC FUNCTION PANEL
Albumin: 4 g/dL (ref 3.5–5.2)
Bilirubin, Direct: 0.1 mg/dL (ref 0.0–0.3)
Total Protein: 6.5 g/dL (ref 6.0–8.3)

## 2010-12-28 LAB — CBC WITH DIFFERENTIAL/PLATELET
Basophils Relative: 0.5 % (ref 0.0–3.0)
Eosinophils Relative: 1.1 % (ref 0.0–5.0)
Lymphocytes Relative: 20.2 % (ref 12.0–46.0)
Neutrophils Relative %: 71.8 % (ref 43.0–77.0)
RBC: 4.14 Mil/uL (ref 3.87–5.11)
WBC: 5.3 10*3/uL (ref 4.5–10.5)

## 2010-12-28 LAB — LIPID PANEL
HDL: 61.6 mg/dL (ref >39.00)
LDL Cholesterol: 77 mg/dL (ref 0–99)
Total CHOL/HDL Ratio: 3
Triglycerides: 119 mg/dL (ref 0.0–149.0)

## 2010-12-28 LAB — TSH: TSH: 0.95 u[IU]/mL (ref 0.35–5.50)

## 2010-12-28 LAB — BASIC METABOLIC PANEL
Calcium: 8.9 mg/dL (ref 8.4–10.5)
GFR: 68.08 mL/min (ref >60.00)
Glucose, Bld: 78 mg/dL (ref 70–99)
Sodium: 138 mEq/L (ref 135–145)

## 2010-12-28 MED ORDER — POTASSIUM CHLORIDE 20 MEQ PO PACK: 20.0000 meq | PACK | Freq: Two times a day (BID) | ORAL | Status: DC

## 2010-12-28 MED ORDER — ESTROGENS, CONJUGATED 0.625 MG/GM VA CREA: TOPICAL_CREAM | VAGINAL | Status: DC

## 2010-12-28 MED ORDER — LISINOPRIL 20 MG PO TABS: 20.0000 mg | ORAL_TABLET | Freq: Every day | ORAL | Status: DC

## 2010-12-28 MED ORDER — SIMVASTATIN 80 MG PO TABS: 40.0000 mg | ORAL_TABLET | Freq: Every day | ORAL | Status: DC

## 2010-12-28 MED ORDER — ALPRAZOLAM 0.5 MG PO TABS: 0.5000 mg | ORAL_TABLET | Freq: Three times a day (TID) | ORAL | Status: DC | PRN

## 2010-12-28 MED ORDER — ESTROGENS CONJUGATED 0.625 MG PO TABS: 0.6250 mg | ORAL_TABLET | Freq: Every day | ORAL | Status: DC

## 2010-12-28 MED ORDER — METOPROLOL TARTRATE 100 MG PO TABS: 100.0000 mg | ORAL_TABLET | Freq: Two times a day (BID) | ORAL | Status: DC

## 2010-12-28 MED ORDER — IBANDRONATE SODIUM 150 MG PO TABS: 150.0000 mg | ORAL_TABLET | ORAL | Status: DC

## 2010-12-28 MED ORDER — FUROSEMIDE 40 MG PO TABS: 40.0000 mg | ORAL_TABLET | Freq: Two times a day (BID) | ORAL | Status: DC

## 2010-12-28 MED ORDER — DEXTROAMPHETAMINE SULFATE ER 10 MG PO CP24: ORAL_CAPSULE | ORAL | Status: DC

## 2010-12-28 MED ORDER — CITALOPRAM HYDROBROMIDE 20 MG PO TABS: 20.0000 mg | ORAL_TABLET | Freq: Every day | ORAL | Status: DC

## 2011-01-06 NOTE — Op Note (Signed)
[facility]  Patient:    Alyssa Fernandez, Alyssa Fernandez Visit Number: [id number] MRN: [id number]          Service Type: SUR Location: 4W [id number] 02 Attending Physician:  Verlee Rossetti. Dictated by:   Almedia Balls Ranell Patrick, M.D. Proc. Date: 07/12/01 Admit Date:  07/12/2001 Discharge Date: 07/13/2001                             Operative Report  PREOPERATIVE DIAGNOSIS:  Right ankle fracture.  POSTOPERATIVE DIAGNOSIS:  Right ankle fracture.  PROCEDURES:  Open reduction internal fixation of right ankle fracture.  SURGEON:  Almedia Balls. Ranell Patrick, M.D.  FIRST ASSISTANT:  Marcie Bal. Troncale, P.A.-C.  ANESTHESIA:  General.  ESTIMATED BLOOD LOSS:  Minimal.  TOURNIQUET TIME:  This was 54 minutes.  INSTRUMENT COUNTS:  Correct.  COMPLICATIONS:  None.  PREOPERATIVE:  Antibiotics were given.  HISTORY OF PRESENT ILLNESS:  The patient is a 70 year old female who presents following a twisting injury to her right ankle.  She was initially no weightbear after her injury, but was gradually able to weightbear over the next several days.  She presented with persistent swelling and severe pain during weightbearing.  X-rays revealed an unstable right SER 4 ankle fracture. She had evidence of medial clear space widening and displacement on lateral x-ray with some shortening at fracture site on lateral side.  After discussing with the patient the need for operative stabilization of her ankle fracture, she consented to surgery.  Risks and benefits were discussed.  DESCRIPTION OF PROCEDURE:  After an adequate level of general anesthesia was achieved plus 1 gram of Ancef given preoperatively, the patient was positioned supine on the operating room table and a nonsterile tourniquet was placed on the right proximal thigh.  The left leg was well-padded and secured to the table.  The right leg was then prepped and draped in the standard sterile fashion.  The leg was exsanguinated using an  Esmarch bandage and tourniquet inflated to 275 mmHg.  A longitudinal incision was created over the distal fibula; this was done using a 10 blade scalpel.  Dissection was carried sharply down through the subcutaneous tissues using 15 blade scalpel down to the periosteum which was incised in line with the skin incision. Subperiosteal dissection of the fracture site was accomplished.  The fracture site was cleaned of all debris and was irrigated.  The fracture was reduced anatomically using a tenaculum clamp.  Next two anterior to posterior lag screws were placed in standard AL fashion, securely fixing the fracture.  At this point a posterior antiglide plate was applied, this was a six-hole _____ tibia plate.  Three fully threaded cortical screws were used to secure the plate to the proximal fragment.  Wound was then irrigated.  C-arm fluoroscopy was used during the case to ensure adequate reduction of the fracture and ankle mortis.  Once all the hardware was in, hard copy films from the C-arm were obtained showing a good mortis view and a lateral view with anatomic ankle reduction.  The wound was then closed using 2-0 Vicryl for the subcutaneous followed by 4-0 Monocryl for the skin.  Steri-Strips were applied followed by a sterile dressing and short leg splint.  The patient was awakened, extubated, and taken to PACU in stable condition having tolerated the surgery well. Dictated by:   Almedia Balls Ranell Patrick, M.D. Attending Physician:  Verlee Rossetti DD:  07/12/01 TD:  07/13/01 Job:  [id number]

## 2011-01-06 NOTE — H&P (Signed)
NAME:  NIMA, BAMBURG NO.:  [id number]   MEDICAL RECORD NO.:  [id number]                   PATIENT TYPE:  INP   LOCATION:  Z610                                 FACILITY:  [facility]   PHYSICIAN:  Jamison Neighbor, M.D.               DATE OF BIRTH:  Mar 17, 1941   DATE OF ADMISSION:  07/29/2002  DATE OF DISCHARGE:                                HISTORY & PHYSICAL   ADMITTING DIAGNOSES:  1. Polycystic kidney disease.  2. Poorly controlled hypertension.  3. Angiomyolipoma left kidney.   HISTORY:  This 70 year old female developed hypertension that was very  difficult to control.  The patient underwent a CT scan which revealed  polycystic kidney disease and included multiple cysts within the liver.  The  patient also was known to have an enhancing lesion in the lower pole of the  left kidney thought to be consistent with a renal cell carcinoma.  She is to  be admitted following exploration and excisional biopsy of that lesion.   PAST MEDICAL HISTORY:  Remarkable for the hypertension.  The patient's blood  pressure has been difficult to control.  She has had some issues with  anxiety in the past.  The only other health problem has been occasional  problems with arthritis.   CURRENT MEDICATIONS:  1. Zocor 80 mg one-half tablet b.i.d.  2. Benicar 40 mg daily.  3. Ranitidine 150 mg b.i.d.  4. Hydrochlorothiazide 25 mg two tablets daily.  5. Clonidine 0.1 mg two tablets in the morning, two at night.  6. Toprol XL 200 mg at night.  7. Zoloft 100 mg at night.   PAST SURGICAL HISTORY:  1. Hysterectomy in 1982.  2. Disk surgery in 1987.  3. Cholecystectomy in 1992.  4. Breast biopsy in 1993.  5. Repair of a right ankle fracture in 2002.   REVIEW OF SYSTEMS:  Pertinent for the anxiety, but is otherwise  unremarkable.  No other ongoing coronary, pulmonary, musculoskeletal,  neurologic, vascular, gastrointestinal, dermatologic disorders.  The patient  does  note an issue of having some difficulty in chewing her food but  otherwise her review of systems was noncontributory.   SOCIAL HISTORY:  Unremarkable.  She does not use alcohol.  She has never  used tobacco.   FAMILY HISTORY:  Pertinent for polycystic kidney disease, but otherwise  normal.   PHYSICAL EXAMINATION:  GENERAL:  She is a well-developed, well-nourished  female.  VITAL SIGNS:  Temperature 97, pulse 55, respirations 16, blood pressure  182/102.  HEENT:  Normocephalic, atraumatic.  Cranial nerves II-XII are grossly  intact.  NECK:  Supple with no adenopathy or thyromegaly.  LUNGS:  Clear.  HEART:  Regular rate and rhythm.  No murmurs, thrills, gallops, rubs, or  heaves.  ABDOMEN:  Soft, nontender with no palpable masses, rebound, or guarding.  There is no palpable mass in either kidney and the liver  is not tender or  enlarged despite the presence of cysts.  EXTREMITIES:  No clubbing, cyanosis, edema.  NEUROLOGIC:  Grossly intact.   IMPRESSION:  1. Polycystic kidney disease.  2. Solid mass in the left lower pole, possible renal cell carcinoma.  3. Poorly controlled hypertension.   PLAN:  Admit following flank exploration, removal of solid renal mass, and  excision of multiple cysts.                                               Jamison Neighbor, M.D.    RJE/MEDQ  D:  07/29/2002  T:  07/29/2002  Job:  [id number]   cc:   Ellin Saba., M.D.  [address] [place]  [place] [address]  Fax: [phone]

## 2011-01-06 NOTE — Discharge Summary (Signed)
NAME:  Alyssa Fernandez, Alyssa Fernandez                          ACCOUNT NO.:  [id number]   MEDICAL RECORD NO.:  [id number]                   PATIENT TYPE:  INP   LOCATION:  0372                                 FACILITY:  [facility]   PHYSICIAN:  Jamison Neighbor, M.D.               DATE OF BIRTH:  [birth year]   DATE OF ADMISSION:  07/29/2002  DATE OF DISCHARGE:  08/02/2002                                 DISCHARGE SUMMARY   DISCHARGE DIAGNOSES:  1. Angiomyolipoma left kidney.  2. Bilateral polycystic kidney disease.  3. Hypertension.  4. Esophageal reflux.   PRINCIPAL PROCEDURE:  Flank exploration, partial nephrectomy, and removal of  multiple renal cysts.   HISTORY:  This 70 year old female developed hypertension that was very  difficult to control.  As part of her evaluation, she had a CT scan which  revealed positive kidney disease as well as multiple cysts in the liver.  The patient was found to have an enhanced lesion of the lower pole of the  left kidney felt to be consistent with a renal cell carcinoma.  She was  admitted to the hospital following exploration and biopsy of that lesion.   PAST MEDICAL HISTORY:  Remarkable for hypertension.  She also has a past  history of anxiety and occasional problems with arthritis and esophageal  reflux disease.   MEDICATIONS:  1. Zocor 80 mg 1/2 tablet b.i.d.  2. Benicar 40 mg daily.  3. Ranitidine 150 mg b.i.d.  4. Hydrochlorothiazide 25 mg two tablets b.i.d.  5. Clonidine 0.1 mg two tablets in the morning, two at night.  6. Toprol-XL 200 mg q.h.s.  7. Zoloft 100 mg q.h.s.   PAST SURGICAL HISTORY:  1. Hysterectomy.  2. Disk surgery.  3. Cholecystectomy.  4. Breast biopsy.  5. Repair of an ankle fracture.   The patient's Review of Systems, Family History and Social History as well  as her initial History and Physical are well delineated in the initial  History and Physical.   HOSPITAL COURSE:  The patient was taken to the operating room on  July 29, 2002 where she underwent exploration with removal of the left renal mass.  Frozen section revealed this to be an angiomyolipoma.  The patient also had  multiple cysts resected and none were found to be enhancing or anything  other than simple cysts.   The patient's postoperative course was unremarkable.  She had initial normal  postoperative labs, then developed some hypotension.  We felt this was  possibly due to the extensive preoperative antihypertensives she initially  showed no evidence of any blood loss.  The patient's hematocrit was checked,  however, and it did show that she had had a decrease in her hematocrit from  32.8 down to 18.  She was transfused with two units of packed red cells.  It  was noted that the patient had a hematoma on the upper aspect  of the  incision where she had been given a postoperative nerve block and this was  certainly felt to be an unusual place for a hematoma to develop.  It was  felt that perhaps some of this had to do with the administration of  postoperative Toradol.  For that reason, the Toradol was discontinued.  The  patient did well after she had had her two units of packed red blood cells.  She was rapidly advanced to a regular diet.  Her PCA was discontinued and  switched to oral medication.  The patient was felt to be ready for discharge  by August 02, 2002.  She had had a bowel movement.  She had very little  pain.  She was afebrile.  Her incision was clear.   DISCHARGE MEDICATIONS:  She was sent home with Tylox as needed for pain.  She was asked to stay on her antihypertensives unless modified in the  postoperative period by Dr. Scotty Court.   FOLLOW UP:  She will return later on in the week for removal of her staples.  The patient will need to have careful monitoring of her renal disease as  well as the cysts in the kidney and liver long-term.  She is aware of the  potential problems with renal failure associated with  polycystic disease.                                               Jamison Neighbor, M.D.    RJE/MEDQ  D:  08/12/2002  T:  08/13/2002  Job:  [id number]   cc:   Ellin Saba., M.D.  [address] [place]  [place] [address]  Fax: [phone]

## 2011-01-06 NOTE — Op Note (Signed)
NAME:  ADONAI, HELZER NO.:  [id number]   MEDICAL RECORD NO.:  [id number]                   PATIENT TYPE:  INP   LOCATION:  A540                                 FACILITY:  [facility]   PHYSICIAN:  Jamison Neighbor, M.D.               DATE OF BIRTH:  Feb 22, 1941   DATE OF PROCEDURE:  07/29/2002  DATE OF DISCHARGE:                                 OPERATIVE REPORT   SERVICE:  Urology.   PREOPERATIVE DIAGNOSES:  1. Bilateral polycystic kidney disease.  2. Left lower pole solid renal mass, rule out renal cell carcinoma.   POSTOPERATIVE DIAGNOSES:  1. Bilateral polycystic kidney disease.  2. Angiomyolipoma lower pole left kidney.   PROCEDURE:  Flank exploration, excisional biopsy with frozen section of  angiomyolipoma, excision of multiple benign renal cysts.   SURGEON:  Jamison Neighbor, M.D.   ASSISTANT:  Melvyn Novas, M.D. and Lindaann Slough, M.D.   ANESTHESIA:  General.   COMPLICATIONS:  None.   DRAINS:  Foley catheter.   BRIEF HISTORY:  This 70 year old female developed poorly controlled  hypertension. She underwent a CT scan as part of that evaluation which  revealed bilateral polycystic kidney disease. The radiologist noted that in  addition to the multiple cysts, there was what appeared to be an enhancing  lesion in the bottom portion of the left kidney felt to be most consistent  with a renal cell carcinoma. The patient has been advised of the finding and  has agreed to undergo exploration and biopsy. She knows that if the patient  does a renal cell carcinoma that a nephrectomy may be required. She is also  aware of the fact that with polycystic kidney disease she is at risk for  renal failure going forward. She strongly wishes to preserve as many  nephrons as possible. She will undergo excisional biopsy with nephrectomy  and/or partial nephrectomy if indicated. She understands the risks and  benefits of the procedure and gave full and  informed consent.   DESCRIPTION OF PROCEDURE:  After successful induction of general anesthesia,  the patient was placed in the flank position. She was secured to the bed  with tape and a bean bag. The kidney rest was elevated and the bed was  flexed in order to place the kidney in an optimal position. She was then  prepped and draped in the usual sterile fashion. An incision was made  beginning at the tip of the twelfth rib and extending underneath the rib  cage medially. This was carried down through the oblique musculature until  the retroperitoneal space was identified. The peritoneum was pushed medially  and the underlying kidney was identified. Gerota's fascia over top of the  kidney was excised exposing the entire kidney which was then mobilized  within the fascia. Multiple cysts of varying sizes were identified. Over a  dozen cysts were excised and the bases fulgurated  with the argon beam  coagulator. The cyst walls were sent for permanent section. After the cyst  had been excised, a solid mass was seen at the lower pole on the medial  aspect. This was circumscribed and the capsule was excised. Sharp dissection  was then used to excise the lesion which was sent down for frozen section.  The base of the lesion appeared to be normal and it did appear that an  adequate margin was obtained around the lesion. Hemostasis was obtained with  the argon beam coagulator and after that area had been carefully coagulated  there was no evidence of any significant bleeding, there was evidence of any  injury to the collecting system. Tisseel was placed in the depths of the  wound and the wound cavity was then packed off with Surgicel. A final  inspection showed that all the large cysts had been removed, all the cyst  sites had been carefully fulgurated. The tumor bed itself appeared to be  well controlled with a coagulant of Tisseel in place and the Surgicel  wrapping that area. The kidney was  then wrapped in a piece of Gelfoam and  Gerota's fascia was closed over top of the kidney and it was felt that this  was soaked dry and there was no injury to the collecting system, so no drain  was placed. The patient was then closed in layers. There were 2 layers of  running #0 Vicryl and then a final layer of #1 PDS. The patient had an  infiltration of local anesthetic for postoperative analgesia. She was then  closed with surgical clips. The patient tolerated the procedure well and was  taken to the recovery room in good condition.                                               Jamison Neighbor, M.D.    RJE/MEDQ  D:  07/29/2002  T:  07/29/2002  Job:  [id number]   cc:   Ellin Saba., M.D.  [address] [place]  [place] [address]  Fax: [phone]

## 2011-01-22 ENCOUNTER — Encounter: Payer: Self-pay | Admitting: Family Medicine

## 2011-01-22 NOTE — Progress Notes (Signed)
  Subjective:    Patient ID: Alyssa Fernandez, female    DOB: 1940/09/24, 70 y.o.   MRN: [id number] This 70 year old white married female is in today to discuss her medications and obtain necessary lab studies. She relates she had been doing bur well except having fallen 3 weeks ago and receiving laceration of the face for which she received 13 stitches and this has healed very well.. She continues to have problems with insomnia and would like Ambien 10 mg. Blood pressure has been well-controlled Dextroamphetamine has been doing bur well as far as remarkable at C. And she needs this refilled. She is continued on estrogen and has had hysterectomy so no necessity for progesterone. GERD is well-controlled with Zegerid. Atrophic vaginitis is controlled with Premarin vaginal cream 3 times per week anxiety depression controlled with alprazolam and sertraline She continues to be very pleased with her pelvic sling surgery HPI    Review of Systemssee history of present illness    Objective:   Physical Exam The patient is well-developed well-nourished white female very pleasant and cooperative HEENT throat negative  carotid pulses good thyroid is normal,Lacerationis feeling nicely minimal scarring Chest examination lungs are clear palpation percussion and auscultation no rales no dullness no wheeze Breast full no masses palpable no tenderness nipples normal axilla clear no lymphadenopathy Heart no evidence of cardiomegaly heart sounds are good without murmurs regular rhythm peripheral pulses are good and equal bilaterally Abdomen liver spleen and kidneys are nonpalpable bowel sounds normal I.  Pelvic examination external in shortness normal vaginal mucosa moist and soft absent uterus adnexal areas negative Rectal exam negative Extremities joints short nontender full range of motion Neurological examination negative reflexes 2+ bilaterally Skin negative other than laceration and face            Assessment & Plan:  Hypertension well controlled no change in medication GERD under control continue Zegerid Anxiety and depression continue alprazolam and sertraline And Celexa Insomnia Ambien 10 mg h.s. Narcolepsy continue Dexedrine Spansules 10 mg q.a.m. P.r.n. Postmenopausal syndrome continue Premarin

## 2011-01-24 NOTE — Progress Notes (Signed)
Quick Note:  Pt is aware. ______ 

## 2011-01-27 ENCOUNTER — Emergency Department (HOSPITAL_COMMUNITY)
Admission: EM | Admit: 2011-01-27 | Discharge: 2011-01-27 | Disposition: A | Discharge status: Home / Self Care | Payer: BC Managed Care – PPO

## 2011-03-01 ENCOUNTER — Other Ambulatory Visit: Payer: Self-pay | Admitting: Family Medicine

## 2011-03-29 ENCOUNTER — Encounter: Payer: Self-pay | Admitting: Family Medicine

## 2011-03-29 ENCOUNTER — Ambulatory Visit (INDEPENDENT_AMBULATORY_CARE_PROVIDER_SITE_OTHER): Payer: Medicare Other | Admitting: Family Medicine

## 2011-03-29 VITALS — BP 122/70 | HR 66 | Temp 97.7°F | Wt 138.0 lb

## 2011-03-29 DIAGNOSIS — N390 Urinary tract infection, site not specified: Secondary | ICD-10-CM

## 2011-03-29 DIAGNOSIS — R3989 Other symptoms and signs involving the genitourinary system: Secondary | ICD-10-CM

## 2011-03-29 DIAGNOSIS — I10 Essential (primary) hypertension: Secondary | ICD-10-CM

## 2011-03-29 DIAGNOSIS — N342 Other urethritis: Secondary | ICD-10-CM

## 2011-03-29 DIAGNOSIS — R39198 Other difficulties with micturition: Secondary | ICD-10-CM

## 2011-03-29 DIAGNOSIS — G4700 Insomnia, unspecified: Secondary | ICD-10-CM

## 2011-03-29 LAB — POCT URINALYSIS DIPSTICK
Blood, UA: NEGATIVE
Protein, UA: NEGATIVE
Spec Grav, UA: 1.005
Urobilinogen, UA: 0.2

## 2011-03-29 MED ORDER — CIPROFLOXACIN HCL 500 MG PO TABS: 500.0000 mg | ORAL_TABLET | Freq: Two times a day (BID) | ORAL | Status: AC

## 2011-03-29 MED ORDER — ZOLPIDEM TARTRATE 10 MG PO TABS: 10.0000 mg | ORAL_TABLET | Freq: Every evening | ORAL | Status: DC | PRN

## 2011-03-29 NOTE — Patient Instructions (Signed)
Urinary tract infection treated with cipro Return after finish medication for urinalysis

## 2011-03-31 ENCOUNTER — Encounter: Payer: Self-pay | Admitting: Family Medicine

## 2011-03-31 NOTE — Progress Notes (Signed)
  Subjective:    Patient ID: Alyssa Fernandez, female    DOB: 1940-09-19, 70 y.o.   MRN: [id number] This 70 year old white married female is in today complaining of an unusual sensation in the vagina radiating to the back some dryness in the vagina and she is for much concerned that there is a problem with her showing procedure in the past year she relates no dysuria or change in frequency or difficulty urinating She does complain of having a problem with insomnia she relates her other medical problems are under control of her hypertension GERD no symptoms of post menopausal syndrome at this time past history of polycystic kidney disease, HPI    Review of Systems CHPI     Objective:   Physical Exam the patient is a well-developed well-nourished attractive white female pleasant cooperative in no acute distress who appears younger than her given age Abdominal examination reveals liver spleen kidneys nonpalpable no masses no tenderness over the suprapubic area bowel sounds normal Pelvic examination external introitus is normal vaginal mucosa normal bladder is tight no pain on straining no prolapse Rectal examination negative adnexal areas clear        Assessment & Plan:  Urinary tract infection and urethritis 2 treat with Cipro ofloxacin 500 mg 2 times a day for 10 days Insomnia 2 treat with Ambien 10 mg at bedtime Hypertension well controlled continue same medication GERD good control continue Nexium Depression doing well continue Zoloft

## 2011-04-12 ENCOUNTER — Other Ambulatory Visit: Payer: Self-pay | Admitting: Family Medicine

## 2011-04-12 ENCOUNTER — Other Ambulatory Visit (INDEPENDENT_AMBULATORY_CARE_PROVIDER_SITE_OTHER): Payer: Medicare Other

## 2011-04-12 DIAGNOSIS — N390 Urinary tract infection, site not specified: Secondary | ICD-10-CM

## 2011-04-12 LAB — POCT URINALYSIS DIPSTICK
Ketones, UA: NEGATIVE
Protein, UA: NEGATIVE
Spec Grav, UA: 1.015

## 2011-04-17 ENCOUNTER — Other Ambulatory Visit: Payer: Self-pay | Admitting: Family Medicine

## 2011-05-28 ENCOUNTER — Emergency Department (HOSPITAL_COMMUNITY)
Admission: EM | Admit: 2011-05-28 | Discharge: 2011-05-29 | Disposition: A | Discharge status: Home / Self Care | Payer: Medicare Other | Attending: Emergency Medicine | Admitting: Emergency Medicine

## 2011-05-28 DIAGNOSIS — W260XXA Contact with knife, initial encounter: Secondary | ICD-10-CM | POA: Insufficient documentation

## 2011-05-28 DIAGNOSIS — S61209A Unspecified open wound of unspecified finger without damage to nail, initial encounter: Secondary | ICD-10-CM | POA: Insufficient documentation

## 2011-05-28 DIAGNOSIS — W261XXA Contact with sword or dagger, initial encounter: Secondary | ICD-10-CM | POA: Insufficient documentation

## 2011-06-02 ENCOUNTER — Encounter: Payer: Self-pay | Admitting: Family Medicine

## 2011-06-02 ENCOUNTER — Ambulatory Visit (INDEPENDENT_AMBULATORY_CARE_PROVIDER_SITE_OTHER): Payer: Medicare Other | Admitting: Family Medicine

## 2011-06-02 VITALS — BP 130/78 | Temp 98.1°F | Wt 139.0 lb

## 2011-06-02 DIAGNOSIS — S61209A Unspecified open wound of unspecified finger without damage to nail, initial encounter: Secondary | ICD-10-CM

## 2011-06-02 DIAGNOSIS — S61011A Laceration without foreign body of right thumb without damage to nail, initial encounter: Secondary | ICD-10-CM

## 2011-06-02 DIAGNOSIS — S61218A Laceration without foreign body of other finger without damage to nail, initial encounter: Secondary | ICD-10-CM

## 2011-06-02 DIAGNOSIS — Z23 Encounter for immunization: Secondary | ICD-10-CM

## 2011-06-02 NOTE — Progress Notes (Signed)
  Subjective:    Patient ID: Alyssa Fernandez, female    DOB: November 14, 1940, 70 y.o.   MRN: [id number]  HPI Acute visit. Patient seen with lacerations right middle finger and right thumb.  This occurred in her kitchen with slicing instrument. Thumb laceration sutured. She had deep avulsion laceration right middle finger. No active bleeding. Keeping dry for the most part. No redness or drainage. Minimal pain. No fever or chills. Tetanus 2008.  Past Medical History  Diagnosis Date  . Hyperlipidemia   . Hypertension   . Polycystic kidney disease    Past Surgical History  Procedure Date  . Partial nephrectomy   . Abdominal hysterectomy   . Cervical disc surgery   . Bunionectomy   . Fx rt ankle   . Left temporal bx artery-negative 2    . Bladder operation, but per sling procedure   . 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 She has never used smokeless tobacco. She reports that she does not drink alcohol or use illicit drugs. family history is not on file. Allergies  Allergen Reactions  . Codeine   . Sulfonamide Derivatives     Review of Systems  Constitutional: Negative for fever and chills.  Hematological: Negative for adenopathy. Does not bruise/bleed easily.       Objective:   Physical Exam  Constitutional: She appears well-developed and well-nourished.  Cardiovascular: Normal rate, regular rhythm and normal heart sounds.   Pulmonary/Chest: Effort normal and breath sounds normal. No respiratory distress. She has no wheezes. She has no rales.  Musculoskeletal:       Right middle finger deep avulsion laceration. No bone exposed. No redness or warmth. Mild swelling. No drainage  Right thumb flap laceration with 2 Sutures in place. Minimal swelling. No erythema or drainage. Nontender.  Full ROM all joints of hand          Assessment & Plan:  Lacerations right middle finger and right first digit. No signs of secondary infection. Would not recommend  suture removal yet since this is 5 days. Return Monday for suture removal. Reviewed wound care. Right middle finger wound redressed. Tetanus up-to-date

## 2011-06-05 ENCOUNTER — Encounter: Payer: Self-pay | Admitting: Family Medicine

## 2011-06-05 ENCOUNTER — Ambulatory Visit (INDEPENDENT_AMBULATORY_CARE_PROVIDER_SITE_OTHER): Payer: Medicare Other | Admitting: Family Medicine

## 2011-06-05 VITALS — BP 120/70 | Temp 98.9°F | Wt 139.0 lb

## 2011-06-05 DIAGNOSIS — S61209A Unspecified open wound of unspecified finger without damage to nail, initial encounter: Secondary | ICD-10-CM

## 2011-06-05 DIAGNOSIS — S61019A Laceration without foreign body of unspecified thumb without damage to nail, initial encounter: Secondary | ICD-10-CM

## 2011-06-05 DIAGNOSIS — S61218A Laceration without foreign body of other finger without damage to nail, initial encounter: Secondary | ICD-10-CM

## 2011-06-05 NOTE — Patient Instructions (Signed)
Follow up as needed for any signs of infection such as redness, swelling, or any pus like drainage.

## 2011-06-05 NOTE — Progress Notes (Signed)
  Subjective:    Patient ID: Alyssa Fernandez, female    DOB: [birth year], 70 y.o.   MRN: [id number]  HPI Here for wound recheck and suture removal. Refer to prior note. Thumb laceration healing well. No drainage. Still has increased sensitivity from middle finger laceration with avulsion type of tip of finger. She has not been changing dressing. Minimal drainage. No redness. No fever or chills.  Good sensation distal middle finger.   Review of Systems  Constitutional: Negative for fever and chills.  Neurological: Negative for numbness.       Objective:   Physical Exam  Constitutional: She appears well-developed and well-nourished.  Cardiovascular: Normal rate and regular rhythm.   Pulmonary/Chest: Effort normal and breath sounds normal. No respiratory distress. She has no wheezes. She has no rales.  Musculoskeletal:       Right middle finger reveals good healing. No redness or warmth. Minimal edema  Right thumb laceration is healing well. No redness or drainage. Nontender          Assessment & Plan:  Phalanx lacerations as above. Both appear to be healing well. Redress today. Sutures removed from right thumb.

## 2011-06-07 ENCOUNTER — Other Ambulatory Visit: Payer: Self-pay

## 2011-06-07 MED ORDER — HYDROCODONE-ACETAMINOPHEN 5-500 MG PO TABS: 1.0000 | ORAL_TABLET | ORAL | Status: DC | PRN

## 2011-06-07 NOTE — Telephone Encounter (Signed)
Ok per Dr. Stafford to fill medication.   

## 2011-10-10 ENCOUNTER — Other Ambulatory Visit: Payer: Self-pay | Admitting: Family Medicine

## 2011-10-12 NOTE — Telephone Encounter (Signed)
Pt called and is req to get refill of zolpidem (AMBIEN) 10 MG tablet called in to [facility]. Pt has an appt to [facility] with Dr. Oliver Barre on 11/20/11. Pt is req refill to last until appt date.

## 2011-10-13 NOTE — Telephone Encounter (Signed)
Denied.  She needs to establish with new primary

## 2011-10-13 NOTE — Telephone Encounter (Signed)
Pls advise.  

## 2011-10-17 ENCOUNTER — Other Ambulatory Visit: Payer: Self-pay

## 2011-10-17 MED ORDER — ZOLPIDEM TARTRATE 10 MG PO TABS: 10.0000 mg | ORAL_TABLET | Freq: Every evening | ORAL | Status: DC | PRN

## 2011-10-17 NOTE — Telephone Encounter (Signed)
Ok per Dr. Caryl Never to fill zolpidem tartrate 10 mg # 30 until pt's appt on 11/20/11.  Rx called in to pharmacy.

## 2011-11-16 ENCOUNTER — Encounter: Payer: Self-pay | Admitting: Internal Medicine

## 2011-11-16 ENCOUNTER — Telehealth: Payer: Self-pay | Admitting: *Deleted

## 2011-11-16 DIAGNOSIS — Z0000 Encounter for general adult medical examination without abnormal findings: Secondary | ICD-10-CM | POA: Insufficient documentation

## 2011-11-16 DIAGNOSIS — M51369 Other intervertebral disc degeneration, lumbar region without mention of lumbar back pain or lower extremity pain: Secondary | ICD-10-CM

## 2011-11-16 DIAGNOSIS — M5136 Other intervertebral disc degeneration, lumbar region: Secondary | ICD-10-CM | POA: Insufficient documentation

## 2011-11-16 DIAGNOSIS — M47812 Spondylosis without myelopathy or radiculopathy, cervical region: Secondary | ICD-10-CM | POA: Insufficient documentation

## 2011-11-16 DIAGNOSIS — F419 Anxiety disorder, unspecified: Secondary | ICD-10-CM | POA: Insufficient documentation

## 2011-11-16 DIAGNOSIS — Z0001 Encounter for general adult medical examination with abnormal findings: Secondary | ICD-10-CM | POA: Insufficient documentation

## 2011-11-16 DIAGNOSIS — F411 Generalized anxiety disorder: Secondary | ICD-10-CM

## 2011-11-16 HISTORY — DX: Other intervertebral disc degeneration, lumbar region: M51.36

## 2011-11-16 HISTORY — DX: Other intervertebral disc degeneration, lumbar region without mention of lumbar back pain or lower extremity pain: M51.369

## 2011-11-16 HISTORY — DX: Generalized anxiety disorder: F41.1

## 2011-11-16 MED ORDER — ZOLPIDEM TARTRATE 10 MG PO TABS: 10.0000 mg | ORAL_TABLET | Freq: Every evening | ORAL | Status: DC | PRN

## 2011-11-16 NOTE — Telephone Encounter (Signed)
Refill once 

## 2011-11-16 NOTE — Telephone Encounter (Signed)
Zolpidem last filled 10-17-11, #30 with 0 refills

## 2011-11-20 ENCOUNTER — Ambulatory Visit (INDEPENDENT_AMBULATORY_CARE_PROVIDER_SITE_OTHER): Payer: Medicare Other | Admitting: Internal Medicine

## 2011-11-20 ENCOUNTER — Other Ambulatory Visit (INDEPENDENT_AMBULATORY_CARE_PROVIDER_SITE_OTHER): Payer: Medicare Other

## 2011-11-20 ENCOUNTER — Encounter: Payer: Self-pay | Admitting: Internal Medicine

## 2011-11-20 VITALS — BP 120/72 | HR 51 | Temp 98.2°F | Ht 63.0 in | Wt 139.2 lb

## 2011-11-20 DIAGNOSIS — E785 Hyperlipidemia, unspecified: Secondary | ICD-10-CM

## 2011-11-20 DIAGNOSIS — E039 Hypothyroidism, unspecified: Secondary | ICD-10-CM

## 2011-11-20 DIAGNOSIS — N19 Unspecified kidney failure: Secondary | ICD-10-CM

## 2011-11-20 DIAGNOSIS — R21 Rash and other nonspecific skin eruption: Secondary | ICD-10-CM

## 2011-11-20 DIAGNOSIS — I10 Essential (primary) hypertension: Secondary | ICD-10-CM

## 2011-11-20 HISTORY — DX: Rash and other nonspecific skin eruption: R21

## 2011-11-20 LAB — CBC WITH DIFFERENTIAL/PLATELET
Basophils Absolute: 0 10*3/uL (ref 0.0–0.1)
Basophils Relative: 0.3 % (ref 0.0–3.0)
Eosinophils Absolute: 0.1 10*3/uL (ref 0.0–0.7)
HCT: 37.4 % (ref 36.0–46.0)
Hemoglobin: 12.6 g/dL (ref 12.0–15.0)
Lymphs Abs: 1.3 10*3/uL (ref 0.7–4.0)
MCHC: 33.6 g/dL (ref 30.0–36.0)
MCV: 90.2 fl (ref 78.0–100.0)
Monocytes Absolute: 0.4 10*3/uL (ref 0.1–1.0)
Neutro Abs: 3.4 10*3/uL (ref 1.4–7.7)
RBC: 4.14 Mil/uL (ref 3.87–5.11)
RDW: 13.1 % (ref 11.5–14.6)

## 2011-11-20 LAB — URINALYSIS, ROUTINE W REFLEX MICROSCOPIC
Ketones, ur: NEGATIVE
Leukocytes, UA: NEGATIVE
Nitrite: NEGATIVE
Specific Gravity, Urine: 1.01 (ref 1.000–1.030)
Urobilinogen, UA: 0.2 (ref 0.0–1.0)

## 2011-11-20 LAB — HEPATIC FUNCTION PANEL
Alkaline Phosphatase: 38 U/L — ABNORMAL LOW (ref 39–117)
Bilirubin, Direct: 0.1 mg/dL (ref 0.0–0.3)
Total Bilirubin: 0.3 mg/dL (ref 0.3–1.2)

## 2011-11-20 LAB — BASIC METABOLIC PANEL
CO2: 30 mEq/L (ref 19–32)
Calcium: 8.5 mg/dL (ref 8.4–10.5)
Creatinine, Ser: 1.1 mg/dL (ref 0.4–1.2)
Sodium: 139 mEq/L (ref 135–145)

## 2011-11-20 LAB — LIPID PANEL
LDL Cholesterol: 77 mg/dL (ref 0–99)
Total CHOL/HDL Ratio: 3
Triglycerides: 110 mg/dL (ref 0.0–149.0)

## 2011-11-20 NOTE — Patient Instructions (Addendum)
Please remember to followup with your GYN for the yearly mammogram, and bone density (consider [facility] on [address]) You are otherwise up to date with prevention Please go to LAB in the Basement for the blood and/or urine tests to be done today You will be contacted by phone if any changes need to be made immediately.  Otherwise, you will receive a letter about your results with an explanation. Continue all other medications as before, except ok to stop the zoloft Please have the pharmacy call with any refills you may need. Please return in 1 year for your yearly visit, or sooner if needed

## 2011-11-20 NOTE — Assessment & Plan Note (Signed)
prob irritative, contact like in origin, minor, ok for otc cortaid/benadryl ,  to f/u any worsening symptoms or concerns

## 2011-11-20 NOTE — Progress Notes (Signed)
Subjective:    Patient ID: Alyssa Fernandez, female    DOB: 03-25-1941, 71 y.o.   MRN: [id number]  HPI  Here as new pt to me, in transfer from Dr Scotty Court who has recently retired.  Incidentally with mild erythema about the right eye after some lotion before bed last PM accidentally into the eye she states, now some irritation today.  No fever.  Pt denies chest pain, increased sob or doe, wheezing, orthopnea, PND, increased LE swelling, palpitations, dizziness or syncope.  Pt denies new neurological symptoms such as new headache, or facial or extremity weakness or numbness   Pt denies polydipsia, polyuria.  Pt states overall good compliance with meds, trying to follow lower cholesterol diet, wt overall stable but little exercise however.   Denies worsening depressive symptoms, suicidal ideation, or panic, though has ongoing anxiety, and still taking the zoloft and the citalopram both, not stopping the zoloft when the citalpram was begun may 2012.  Emeline General for sleep, working well for now.   Pt denies fever, wt loss, night sweats, loss of appetite, or other constitutional symptoms.  No other new complaints.  Is due for mamogram and dxa as well - pt plans to f/u with this herself.  Denies hyper or hypo thyroid symptoms such as voice, skin or hair change.  Past Medical History  Diagnosis Date  . Hyperlipidemia   . Hypertension   . Polycystic kidney disease   . Anxiety 11/16/2011  . 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    Qualifier: Diagnosis of  By: Alphonzo Severance MD, Loni Dolly HYPOTHYROIDISM 02/12/2008    Qualifier: Diagnosis of  By: Alphonzo Severance MD, Loni Dolly HYPERLIPIDEMIA 03/25/2007    Qualifier: Diagnosis of  By: Lillia Mountain RN, Brien Few GERD 06/12/2007    Qualifier: Diagnosis of  By: Alphonzo Severance MD, Loni Dolly ESSENTIAL HYPERTENSION 02/24/2010    Qualifier: Diagnosis of  By: Alphonzo Severance MD, Loni Dolly DERMATITIS, ATOPIC 01/20/2010    Qualifier: Diagnosis of  By: Alphonzo Severance MD, Loni Dolly VITAMIN D DEFICIENCY 11/10/2009    Qualifier: Diagnosis of  By: Rita Ohara    . 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 Partial nephrectomy   . Abdominal hysterectomy   . Cervical disc surgery   . Bunionectomy   . Fx rt ankle   . Left temporal bx artery-negative 2    . Bladder operation, but per sling procedure   . 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 She has never used smokeless tobacco. She reports that she does not drink alcohol or use illicit drugs. family history includes Stroke in her mother. Allergies  Allergen Reactions  . Codeine   . Sulfonamide Derivatives    Current Outpatient Prescriptions on File Prior to Visit  Medication Sig Dispense Refill  . ALPRAZolam (XANAX) 0.5  MG tablet Take 1 tablet (0.5 mg total) by mouth 3 (three) times daily as needed.  90 tablet  5  . amLODipine (NORVASC) 10 MG tablet ONE TABLET EVERY DAY FOR BLOOD PRESSURE  30 tablet  10  . Ascorbic Acid (VITAMIN C) 100 MG tablet Take 100 mg by mouth daily.        . calcium carbonate (OS-CAL) 1250 MG tablet Take 1 tablet by mouth daily.        . citalopram (CELEXA) 20 MG tablet Take 1 tablet (20 mg total) by mouth daily. For depression, may incease to 2 qd  30 tablet  11  . conjugated estrogens (PREMARIN) vaginal cream Insert 1/3 to 1/2 applicator daily at hs  45 g  11  . Cyanocobalamin (VITAMIN B 12 PO) Take by mouth.        . dextroamphetamine (DEXEDRINE SPANSULE) 10 MG 24 hr capsule 1 twice daily prn for narcolepsy    60 capsule  0  .  estrogens, conjugated, (PREMARIN) 0.625 MG tablet Take 1 tablet (0.625 mg total) by mouth daily. Take daily for 21 days then do not take for 7 days.  30 tablet  11  . furosemide (LASIX) 40 MG tablet Take 1 tablet (40 mg total) by mouth 2 (two) times daily.  60 tablet  11  . HYDROcodone-acetaminophen (VICODIN) 5-500 MG per tablet Take 1 tablet by mouth every 4 (four) hours as needed for pain. Max of 4 tablets per day.  120 tablet  5  . ibandronate (BONIVA) 150 MG tablet Take 1 tablet (150 mg total) by mouth every 30 (thirty) days. Take in the morning with a full glass of water, on an empty stomach, and do not take anything else by mouth or lie down for the next 30 min.  1 tablet  11  . lisinopril (PRINIVIL,ZESTRIL) 20 MG tablet Take 1 tablet (20 mg total) by mouth daily. For edema and blood pressure and kidney protection  30 tablet  11  . metoprolol (LOPRESSOR) 100 MG tablet Take 1 tablet (100 mg total) by mouth 2 (two) times daily.  60 tablet  11  . omeprazole-sodium bicarbonate (ZEGERID) 40-1100 MG per capsule TAKE ONE CAPSULE EACH DAY  30 capsule  11  . potassium chloride (KLOR-CON) 20 MEQ packet Take 20 mEq by mouth 2 (two) times daily.  60 tablet  11  . simvastatin (ZOCOR) 80 MG tablet Take 0.5 tablets (40 mg total) by mouth at bedtime.  30 tablet  11  . zolpidem (AMBIEN) 10 MG tablet Take 1 tablet (10 mg total) by mouth at bedtime as needed for sleep.  30 tablet  0  . ZOVIRAX 5 % cream APPLY TWICE DAILY  5 g  5  . ciprofloxacin (CIPRO) 500 MG tablet Take 500 mg by mouth 2 (two) times daily. 1 am and 1 pm         . nitrofurantoin, macrocrystal-monohydrate, (MACROBID) 100 MG capsule Take 100 mg by mouth 2 (two) times daily.        . sertraline (ZOLOFT) 100 MG tablet TAKE ONE TABLET TWICE DAILY  60 tablet  11   Review of Systems Review of Systems  Constitutional: Negative for diaphoresis and unexpected weight change.  HENT: Negative for drooling and tinnitus.   Eyes: Negative for photophobia  and visual disturbance.  Respiratory: Negative for choking and stridor.   Gastrointestinal: Negative for vomiting and blood in stool.  Genitourinary: Negative for hematuria and decreased urine volume.  Musculoskeletal: Negative  for gait problem.  Skin: Negative for color change and wound.  Neurological: Negative for tremors and numbness.  Psychiatric/Behavioral: Negative for decreased concentration. The patient is not hyperactive.       Objective:   Physical Exam BP 120/72  Pulse 51  Temp(Src) 98.2 F (36.8 C) (Oral)  Ht 5\' 3"  (1.6 m)  Wt 139 lb 4 oz (63.163 kg)  BMI 24.67 kg/m2  SpO2 95% Physical Exam  VS noted Constitutional: Pt appears well-developed and well-nourished.  HENT: Head: Normocephalic.  Right Ear: External ear normal.  Left Ear: External ear normal.  Eyes: Conjunctivae and EOM are normal. Pupils are equal, round, and reactive to light.  Neck: Normal range of motion. Neck supple.  Cardiovascular: Normal rate and regular rhythm.   Pulmonary/Chest: Effort normal and breath sounds normal.  Abd:  Soft, NT, non-distended, + BS Neurological: Pt is alert. No cranial nerve deficit.  Skin: Skin is warm. No erythema. except for mild for approx 1 cm periorbital right, nontender, minimal swelling Psychiatric: Pt behavior is normal except tense, 1-2+ nervous. Thought content normal.     Assessment & Plan:

## 2011-11-20 NOTE — Assessment & Plan Note (Signed)
stable overall by hx and exam, most recent data reviewed with pt, and pt to continue medical treatment as before  Lab Results  Component Value Date   WBC 5.3 12/28/2010   HGB 13.1 12/28/2010   HCT 38.1 12/28/2010   PLT 223.0 12/28/2010   GLUCOSE 78 12/28/2010   CHOL 162 12/28/2010   TRIG 119.0 12/28/2010   HDL 61.60 12/28/2010   LDLCALC 77 12/28/2010   ALT 11 12/28/2010   AST 17 12/28/2010   NA 138 12/28/2010   K 4.0 12/28/2010   CL 101 12/28/2010   CREATININE 0.9 12/28/2010   BUN 17 12/28/2010   CO2 28 12/28/2010   TSH 0.95 12/28/2010   HGBA1C 5.9 05/19/2008

## 2011-11-20 NOTE — Assessment & Plan Note (Signed)
.  stable overall by hx and exam, most recent data reviewed with pt, and pt to continue medical treatment as before Lab Results  Component Value Date   LDLCALC 77 12/28/2010   For lipids today, goal < 100

## 2011-11-20 NOTE — Assessment & Plan Note (Signed)
stable overall by hx and exam, most recent data reviewed with pt, and pt to continue medical treatment as before Lab Results  Component Value Date   TSH 0.95 12/28/2010

## 2011-11-20 NOTE — Assessment & Plan Note (Signed)
stable overall by hx and exam, most recent data reviewed with pt, and pt to continue medical treatment as before  BP Readings from Last 3 Encounters:  11/20/11 120/72  06/05/11 120/70  06/02/11 130/78

## 2011-12-25 ENCOUNTER — Other Ambulatory Visit: Payer: Self-pay

## 2011-12-25 MED ORDER — ZOLPIDEM TARTRATE 10 MG PO TABS: 10.0000 mg | ORAL_TABLET | Freq: Every evening | ORAL | Status: DC | PRN

## 2011-12-25 NOTE — Telephone Encounter (Signed)
Faxed hardcopy to pharmacy. 

## 2011-12-25 NOTE — Telephone Encounter (Signed)
Done hardcopy to [facility]bin  

## 2012-01-01 ENCOUNTER — Other Ambulatory Visit: Payer: Self-pay | Admitting: Internal Medicine

## 2012-01-09 ENCOUNTER — Other Ambulatory Visit: Payer: Self-pay | Admitting: Internal Medicine

## 2012-01-23 ENCOUNTER — Other Ambulatory Visit: Payer: Self-pay | Admitting: Internal Medicine

## 2012-01-30 ENCOUNTER — Other Ambulatory Visit: Payer: Self-pay

## 2012-01-30 MED ORDER — ESTROGENS CONJUGATED 0.625 MG PO TABS: 0.6250 mg | ORAL_TABLET | Freq: Every day | ORAL | Status: DC

## 2012-02-01 ENCOUNTER — Other Ambulatory Visit: Payer: Self-pay

## 2012-02-01 MED ORDER — ZOLPIDEM TARTRATE 10 MG PO TABS: 10.0000 mg | ORAL_TABLET | Freq: Every evening | ORAL | Status: DC | PRN

## 2012-02-01 MED ORDER — DEXTROAMPHETAMINE SULFATE ER 10 MG PO CP24: ORAL_CAPSULE | ORAL | Status: DC

## 2012-02-01 NOTE — Telephone Encounter (Signed)
Done hardcopy to [facility]bin  

## 2012-02-01 NOTE — Telephone Encounter (Signed)
Patient picked up hardcopy of both prescriptions at the front desk.

## 2012-02-01 NOTE — Telephone Encounter (Signed)
Patient requesting refill of Zolpidem. Last refill was only for #15 and patient takes every night. Please advise

## 2012-02-08 ENCOUNTER — Other Ambulatory Visit: Payer: Self-pay | Admitting: Internal Medicine

## 2012-02-16 ENCOUNTER — Ambulatory Visit (INDEPENDENT_AMBULATORY_CARE_PROVIDER_SITE_OTHER): Payer: Medicare Other | Admitting: Internal Medicine

## 2012-02-16 VITALS — BP 94/60 | HR 71 | Temp 97.2°F | Ht 63.0 in | Wt 142.2 lb

## 2012-02-16 DIAGNOSIS — I10 Essential (primary) hypertension: Secondary | ICD-10-CM

## 2012-02-16 DIAGNOSIS — R609 Edema, unspecified: Secondary | ICD-10-CM

## 2012-02-16 DIAGNOSIS — Q613 Polycystic kidney, unspecified: Secondary | ICD-10-CM

## 2012-02-16 MED ORDER — AMLODIPINE BESYLATE 5 MG PO TABS: 5.0000 mg | ORAL_TABLET | Freq: Every day | ORAL | Status: DC

## 2012-02-16 MED ORDER — FUROSEMIDE 40 MG PO TABS: 40.0000 mg | ORAL_TABLET | Freq: Every day | ORAL | Status: DC

## 2012-02-16 NOTE — Assessment & Plan Note (Addendum)
ECG reviewed as per emr, edema most likely I suspect related to venous insuff but cant r/o cardiac or other, most recent labs reviewed with pt, encouraged to take lasix dialy, do not push oral fluids as she has been doing, leg elevation, low salt diet, for echocardiogram, compression stockings Lab Results  Component Value Date   WBC 5.2 11/20/2011   HGB 12.6 11/20/2011   HCT 37.4 11/20/2011   PLT 227.0 11/20/2011   GLUCOSE 93 11/20/2011   CHOL 153 11/20/2011   TRIG 110.0 11/20/2011   HDL 54.00 11/20/2011   LDLCALC 77 11/20/2011   ALT 10 11/20/2011   AST 16 11/20/2011   NA 139 11/20/2011   K 3.7 11/20/2011   CL 101 11/20/2011   CREATININE 1.1 11/20/2011   BUN 21 11/20/2011   CO2 30 11/20/2011   TSH 0.98 11/20/2011   HGBA1C 5.9 05/19/2008

## 2012-02-16 NOTE — Patient Instructions (Addendum)
Your EKG was ok today Please decrease the amlodipine to 5 mg per day Please take your lasix 40 mg more daily You will be contacted regarding the referral for: echocardiogram Continue all other medications as before Please have the pharmacy call with any refills you may need. Please return in 3 months, or sooner if needed

## 2012-02-17 ENCOUNTER — Encounter: Payer: Self-pay | Admitting: Internal Medicine

## 2012-02-17 NOTE — Assessment & Plan Note (Signed)
Although not clear, ok to decreaed amlod to 5 mg from 10 given the edema, and lower BP today

## 2012-02-17 NOTE — Progress Notes (Signed)
Subjective:    Patient ID: Alyssa Fernandez, female    DOB: 05-21-1941, 71 y.o.   MRN: [id number]  HPI  Here to f/u with 1-2 wk gradually worsening LE edema to just above the ankles now mostly up to the knees, went away at night to start, but now more persistent, with some discomfort and an unusual rash new ? Bruise like to both mid pretibial areas right > left, without trauma, fever.  Admits to drinking more fliuids lately as she gets thirsty in the summer up to 8 glasses fluids per day, and has not been taking the lasix every day due to the diuretic effect;  Pt denies chest pain, increased sob or doe, wheezing, orthopnea, PND, increased LE swelling, palpitations, dizziness or syncope, except for the above.  Pt denies new neurological symptoms such as new headache, or facial or extremity weakness or numbness   Pt denies polydipsia, polyuria. Past Medical History  Diagnosis Date  . Hyperlipidemia   . Hypertension   . Polycystic kidney disease   . Anxiety 11/16/2011  . 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    Qualifier: Diagnosis of  By: Alphonzo Severance MD, Loni Dolly HYPOTHYROIDISM 02/12/2008    Qualifier: Diagnosis of  By: Alphonzo Severance MD, Loni Dolly HYPERLIPIDEMIA 03/25/2007    Qualifier: Diagnosis of  By: Lillia Mountain RN, Brien Few GERD 06/12/2007    Qualifier: Diagnosis of  By: Alphonzo Severance MD, Loni Dolly ESSENTIAL HYPERTENSION 02/24/2010    Qualifier: Diagnosis of  By: Alphonzo Severance MD, Loni Dolly DERMATITIS, ATOPIC 01/20/2010    Qualifier: Diagnosis of  By: Alphonzo Severance MD, Loni Dolly VITAMIN D DEFICIENCY 11/10/2009    Qualifier: Diagnosis of  By: Rita Ohara    . 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 Partial nephrectomy   . Abdominal hysterectomy   . Cervical disc surgery   . Bunionectomy   . Fx rt ankle   . Left temporal bx artery-negative 2    . Bladder operation, but per sling procedure   . 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 She has never used smokeless tobacco. She reports that she does not drink alcohol or use illicit drugs. family history includes Stroke in her mother. Allergies  Allergen Reactions  . Codeine   . Sulfonamide Derivatives    Current Outpatient Prescriptions on File Prior to Visit  Medication Sig Dispense Refill  . Ascorbic Acid (VITAMIN C) 100 MG tablet Take 100 mg by mouth daily.        . calcium carbonate (OS-CAL) 1250 MG tablet Take 1 tablet by mouth daily.        . citalopram (CELEXA) 20 MG tablet TAKE ONE TABLET EACH DAY FOR DEPRESSION-MAY INCREASE TO 2 TABLETS DAILY  30 tablet  5  . conjugated estrogens (PREMARIN) vaginal cream Insert 1/3  to 1/2 applicator daily at hs  45 g  11  . Cyanocobalamin (VITAMIN B 12 PO) Take by mouth.        . dextroamphetamine (DEXEDRINE SPANSULE) 10 MG 24 hr capsule 1 twice daily prn for narcolepsy  60 capsule  0  . estrogens, conjugated, (PREMARIN) 0.625 MG tablet Take 1 tablet (0.625 mg total) by mouth daily. Take daily for 21 days then do not take for 7 days.  30 tablet  11  . furosemide (LASIX) 40 MG tablet Take 1 tablet (40 mg total) by mouth daily.  90 tablet  3  . HYDROcodone-acetaminophen (VICODIN) 5-500 MG per tablet Take 1 tablet by mouth every 4 (four) hours as needed for pain. Max of 4 tablets per day.  120 tablet  5  . lisinopril (PRINIVIL,ZESTRIL) 20 MG tablet TAKE ONE TABLET EACH DAY FOR EDEMA AND  BLOOD PRESSURE AND KIDNEY PROTECTION  30 tablet  5  . metoprolol (LOPRESSOR) 100 MG tablet TAKE ONE TABLET TWICE DAILY  60 tablet  11  .  omeprazole-sodium bicarbonate (ZEGERID) 40-1100 MG per capsule TAKE ONE CAPSULE EACH DAY  30 capsule  11  . simvastatin (ZOCOR) 80 MG tablet Take 0.5 tablets (40 mg total) by mouth at bedtime.  30 tablet  11  . zolpidem (AMBIEN) 10 MG tablet Take 1 tablet (10 mg total) by mouth at bedtime as needed for sleep.  30 tablet  2  . ZOVIRAX 5 % cream APPLY TWICE DAILY  5 g  5  . ALPRAZolam (XANAX) 0.5 MG tablet Take 1 tablet (0.5 mg total) by mouth 3 (three) times daily as needed.  90 tablet  5  . amLODipine (NORVASC) 5 MG tablet Take 1 tablet (5 mg total) by mouth daily.  90 tablet  3  . potassium chloride (KLOR-CON) 20 MEQ packet Take 20 mEq by mouth 2 (two) times daily.  60 tablet  11  . zolpidem (AMBIEN) 10 MG tablet Take 1 tablet (10 mg total) by mouth at bedtime as needed for sleep.  30 tablet  0   Review of Systems Constitutional: Negative for diaphoresis and unexpected weight change.  Eyes: Negative for photophobia and visual disturbance.  Respiratory: Negative for choking and stridor.   Gastrointestinal: Negative for vomiting and blood in stool.  Genitourinary: Negative for hematuria and decreased urine volume.  Musculoskeletal: Negative for gait problem.  Skin: Negative for ulcer, drainage, abscess  Neurological: Negative for tremors and numbness.  Psychiatric/Behavioral: Negative for decreased concentration. The patient is not hyperactive.      Objective:   Physical Exam BP 94/60  Pulse 71  Temp 97.2 F (36.2 C) (Oral)  Ht 5\' 3"  (1.6 m)  Wt 142 lb 4 oz (64.524 kg)  BMI 25.20 kg/m2  SpO2 95% Physical Exam  VS noted Constitutional: Pt appears well-developed and well-nourished.  HENT: Head: Normocephalic.  Right Ear: External ear normal.  Left Ear: External ear normal. No JVD Eyes: Conjunctivae and EOM are normal. Pupils are equal, round, and reactive to light.  Neck: Normal range of motion. Neck supple.  Cardiovascular: Normal rate and regular rhythm.   Pulmonary/Chest: Effort  normal and breath sounds normal.  Abd:  Soft, NT, non-distended, + BS Neurological: Pt is alert. Not confused; motor/gait intact Skin: Skin is warm. But 1-2+ edema to just below the knees bilat right > left with pretibial erythema nontender rash Psychiatric: Pt behavior is normal. Thought content normal. 1+ nervous    Assessment & Plan:

## 2012-02-17 NOTE — Assessment & Plan Note (Signed)
D/w pt, recent cr stable, doubt related to current edema worsening Lab Results  Component Value Date   CREATININE 1.1 11/20/2011

## 2012-02-21 ENCOUNTER — Other Ambulatory Visit (HOSPITAL_COMMUNITY): Payer: Medicare Other

## 2012-02-26 ENCOUNTER — Ambulatory Visit (HOSPITAL_COMMUNITY): Payer: Medicare Other | Attending: Cardiology | Admitting: Radiology

## 2012-02-26 ENCOUNTER — Encounter: Payer: Self-pay | Admitting: Internal Medicine

## 2012-02-26 DIAGNOSIS — I059 Rheumatic mitral valve disease, unspecified: Secondary | ICD-10-CM | POA: Insufficient documentation

## 2012-02-26 DIAGNOSIS — E785 Hyperlipidemia, unspecified: Secondary | ICD-10-CM | POA: Insufficient documentation

## 2012-02-26 DIAGNOSIS — I10 Essential (primary) hypertension: Secondary | ICD-10-CM | POA: Insufficient documentation

## 2012-02-26 DIAGNOSIS — Q613 Polycystic kidney, unspecified: Secondary | ICD-10-CM | POA: Insufficient documentation

## 2012-02-26 DIAGNOSIS — R609 Edema, unspecified: Secondary | ICD-10-CM | POA: Insufficient documentation

## 2012-02-26 NOTE — Progress Notes (Signed)
Echocardiogram performed.  

## 2012-03-25 ENCOUNTER — Other Ambulatory Visit: Payer: Self-pay | Admitting: Internal Medicine

## 2012-03-25 ENCOUNTER — Other Ambulatory Visit: Payer: Self-pay | Admitting: Family Medicine

## 2012-04-01 ENCOUNTER — Other Ambulatory Visit: Payer: Self-pay

## 2012-04-01 MED ORDER — POTASSIUM CHLORIDE 20 MEQ PO PACK: 20.0000 meq | PACK | Freq: Two times a day (BID) | ORAL | Status: DC

## 2012-05-01 ENCOUNTER — Ambulatory Visit (INDEPENDENT_AMBULATORY_CARE_PROVIDER_SITE_OTHER)
Admission: RE | Admit: 2012-05-01 | Discharge: 2012-05-01 | Disposition: A | Discharge status: Home / Self Care | Payer: Medicare Other | Source: Ambulatory Visit | Attending: Internal Medicine | Admitting: Internal Medicine

## 2012-05-01 ENCOUNTER — Ambulatory Visit (INDEPENDENT_AMBULATORY_CARE_PROVIDER_SITE_OTHER): Payer: Medicare Other | Admitting: Internal Medicine

## 2012-05-01 ENCOUNTER — Encounter: Payer: Self-pay | Admitting: Internal Medicine

## 2012-05-01 ENCOUNTER — Other Ambulatory Visit (INDEPENDENT_AMBULATORY_CARE_PROVIDER_SITE_OTHER): Payer: Medicare Other

## 2012-05-01 VITALS — BP 130/80 | HR 88 | Temp 98.4°F | Ht 64.0 in | Wt 133.0 lb

## 2012-05-01 DIAGNOSIS — R1110 Vomiting, unspecified: Secondary | ICD-10-CM

## 2012-05-01 DIAGNOSIS — R1013 Epigastric pain: Secondary | ICD-10-CM

## 2012-05-01 DIAGNOSIS — R509 Fever, unspecified: Secondary | ICD-10-CM

## 2012-05-01 DIAGNOSIS — Q613 Polycystic kidney, unspecified: Secondary | ICD-10-CM

## 2012-05-01 DIAGNOSIS — K750 Abscess of liver: Secondary | ICD-10-CM

## 2012-05-01 LAB — URINALYSIS, ROUTINE W REFLEX MICROSCOPIC
Specific Gravity, Urine: <=1.005 (ref 1.000–1.030)
Total Protein, Urine: NEGATIVE
Urine Glucose: NEGATIVE

## 2012-05-01 LAB — CBC WITH DIFFERENTIAL/PLATELET
Basophils Absolute: 0 10*3/uL (ref 0.0–0.1)
Eosinophils Absolute: 0.1 10*3/uL (ref 0.0–0.7)
HCT: 31.7 % — ABNORMAL LOW (ref 36.0–46.0)
Lymphs Abs: 1 10*3/uL (ref 0.7–4.0)
MCHC: 33 g/dL (ref 30.0–36.0)
MCV: 90.6 fl (ref 78.0–100.0)
Monocytes Absolute: 0.7 10*3/uL (ref 0.1–1.0)
Neutrophils Relative %: 82.8 % — ABNORMAL HIGH (ref 43.0–77.0)
Platelets: 360 10*3/uL (ref 150.0–400.0)
RDW: 13.9 % (ref 11.5–14.6)

## 2012-05-01 MED ORDER — METRONIDAZOLE 500 MG PO TABS: 500.0000 mg | ORAL_TABLET | Freq: Three times a day (TID) | ORAL | Status: DC

## 2012-05-01 MED ORDER — CIPROFLOXACIN HCL 500 MG PO TABS: 500.0000 mg | ORAL_TABLET | Freq: Two times a day (BID) | ORAL | Status: DC

## 2012-05-01 NOTE — Patient Instructions (Signed)
It was good to see you today. Test(s) ordered today - blood, urine and chest xray. Your results will be called to you after review (48-72hours after test completion). If any changes need to be made, you will be notified at that time. Will plan antibiotics (cipro and flagyl) and CT abdomen and pelvis to look for diverticulitis if no abnormality on chest xray  B.R.A.T. Diet Your doctor has recommended the B.R.A.T. diet for you or your child until the condition improves. This is often used to help control diarrhea and vomiting symptoms. If you or your child can tolerate clear liquids, you may have:  Bananas.   Rice.   Applesauce.   Toast (and other simple starches such as crackers, potatoes, noodles).  Be sure to avoid dairy products, meats, and fatty foods until symptoms are better. Fruit juices such as apple, grape, and prune juice can make diarrhea worse. Avoid these. Continue this diet for 2 days or as instructed by your caregiver. Document Released: 08/07/2005 Document Revised: 07/27/2011 Document Reviewed: 01/24/2007 [facility] Patient Information 2012 [place], [place].

## 2012-05-01 NOTE — Progress Notes (Signed)
Subjective:    Patient ID: Alyssa Fernandez, female    DOB: 06-Apr-1941, 71 y.o.   MRN: [id number]  HPI  See CC Onset of symptoms 10 days ago, progressively worse Associated with weakness and fatigue, then vomiting and abdominal pain with fever x last 4 days (somewhat better in last 24 hours) Also pain in right anterior chest with inspiration and dry cough Denies diarrhea, dysuria, hematuria No trauma or travel No history of same Tolerating only liquids, concerned for dehydration with history polycystic kidney disease  Past Medical History  Diagnosis Date  . Hyperlipidemia   . Hypertension   . Polycystic kidney disease   . Anxiety   . RENAL FAILURE   . 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 HYPOTHYROIDISM   . GERD   . VITAMIN D DEFICIENCY   . OSTEOPENIA   . INSOMNIA   . Cervical spine degeneration     Severe by CT April 2012   Review of Systems  Constitutional: Positive for fever, activity change, appetite change and fatigue.  HENT: Negative for nosebleeds, congestion, sore throat, rhinorrhea, trouble swallowing, neck pain, neck stiffness, postnasal drip and sinus pressure.   Respiratory: Positive for cough. Negative for chest tightness and wheezing.   Cardiovascular: Positive for chest pain. Negative for leg swelling.  Gastrointestinal: Positive for vomiting, abdominal pain and abdominal distention. Negative for nausea, diarrhea and constipation.  Genitourinary: Negative for dysuria and flank pain.  Musculoskeletal: Negative for back pain and joint swelling.  Skin: Negative for rash and wound.  Neurological: Positive for weakness. Negative for syncope and headaches.       Objective:   Physical Exam  BP 130/80  Pulse 88  Temp 98.4 F (36.9 C) (Oral)  Ht 5\' 4"  (1.626 m)  Wt 133 lb (60.328 kg)  BMI 22.83 kg/m2  SpO2 97% Wt Readings from Last 3 Encounters:  05/01/12 133 lb (60.328 kg)  02/16/12 142 lb 4 oz (64.524 kg)  11/20/11 139 lb 4 oz (63.163 kg)    Constitutional: She appears fatigued and ill but no acute distress. spouse at side HENT: Head: Normocephalic and atraumatic. Ears: B TMs ok, no erythema or effusion; Nose: Nose normal. Mouth/Throat: Oropharynx is clear and moist. No oropharyngeal exudate.  Eyes: Conjunctivae and EOM are normal. Pupils are equal, round, and reactive to light. No scleral icterus.  Neck: Normal range of motion. Neck supple. No JVD present. No thyromegaly present.  Cardiovascular: Normal rate, regular rhythm and normal heart sounds.  No murmur heard. No BLE edema. Pulmonary/Chest: Effort normal and breath sounds diminished at bases. No respiratory distress. She has no wheezes.  Abdominal: soft but with diminished bowel sounds. There is RUQ tenderness. 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 No gross deformities Neurological: She is alert and oriented to person, place, and time. No cranial nerve deficit. Coordination normal.  Skin: Skin is warm and dry. No rash noted. No erythema.    Lab Results  Component Value Date   WBC 5.2 11/20/2011   HGB 12.6 11/20/2011   HCT 37.4 11/20/2011   PLT 227.0 11/20/2011   GLUCOSE 93 11/20/2011   CHOL 153 11/20/2011   TRIG 110.0 11/20/2011   HDL 54.00 11/20/2011   LDLCALC 77 11/20/2011   ALT 10 11/20/2011   AST 16 11/20/2011   NA 139 11/20/2011   K 3.7 11/20/2011   CL 101 11/20/2011   CREATININE 1.1 11/20/2011   BUN 21 11/20/2011   CO2  30 11/20/2011   TSH 0.98 11/20/2011   HGBA1C 5.9 05/19/2008     Echo 02/2012: ------------------------------------------------------------ Study Conclusions  - Left ventricle: The cavity size was normal. Wall thickness was normal. Systolic function was vigorous. The estimated ejection fraction was in the range of 65% to 70%. Wall motion was normal; there were no regional wall motion abnormalities. Doppler parameters are consistent with abnormal left ventricular relaxation (grade 1 diastolic dysfunction). - Mitral  valve: Mild regurgitation. - Atrial septum: No defect or patent foramen ovale was identified. - Pulmonary arteries: PA peak pressure: 32mm Hg (S).      Assessment & Plan:   Epigastric and RUQ pain vomiting and fever >72h, currently improved Dry cough with right side pleurisy PCKD  Check labs and CXR today Plan CT a/p to look for diverticulitis or other GI infection/problem (unless CXR with RLL PNA) Plan antibiotics - cipro and flagyl unless abnormal CXR suggests need for broader coverage Pt declines need for pain meds Hydrate and tylenol, call if worse or unimproved

## 2012-05-02 LAB — BASIC METABOLIC PANEL
Calcium: 8.3 mg/dL — ABNORMAL LOW (ref 8.4–10.5)
Chloride: 95 mEq/L — ABNORMAL LOW (ref 96–112)
Creatinine, Ser: 0.9 mg/dL (ref 0.4–1.2)
Sodium: 135 mEq/L (ref 135–145)

## 2012-05-02 LAB — HEPATIC FUNCTION PANEL
ALT: 8 U/L (ref 0–35)
Alkaline Phosphatase: 68 U/L (ref 39–117)
Bilirubin, Direct: 0.4 mg/dL — ABNORMAL HIGH (ref 0.0–0.3)
Total Bilirubin: 1.1 mg/dL (ref 0.3–1.2)

## 2012-05-03 ENCOUNTER — Ambulatory Visit (INDEPENDENT_AMBULATORY_CARE_PROVIDER_SITE_OTHER)
Admission: RE | Admit: 2012-05-03 | Discharge: 2012-05-03 | Disposition: A | Discharge status: Home / Self Care | Payer: Medicare Other | Source: Ambulatory Visit | Attending: Internal Medicine | Admitting: Internal Medicine

## 2012-05-03 ENCOUNTER — Ambulatory Visit: Payer: Medicare Other | Admitting: Internal Medicine

## 2012-05-03 DIAGNOSIS — Q613 Polycystic kidney, unspecified: Secondary | ICD-10-CM

## 2012-05-03 DIAGNOSIS — R1013 Epigastric pain: Secondary | ICD-10-CM

## 2012-05-03 DIAGNOSIS — R1110 Vomiting, unspecified: Secondary | ICD-10-CM

## 2012-05-03 DIAGNOSIS — K750 Abscess of liver: Secondary | ICD-10-CM | POA: Insufficient documentation

## 2012-05-03 DIAGNOSIS — R509 Fever, unspecified: Secondary | ICD-10-CM

## 2012-05-03 HISTORY — DX: Abscess of liver: K75.0

## 2012-05-03 MED ORDER — IOHEXOL 300 MG/ML  SOLN
100.0000 mL | Freq: Once | INTRAMUSCULAR | Status: AC | PRN
  Administered 2012-05-03: 100 mL via INTRAVENOUS

## 2012-05-03 NOTE — Addendum Note (Signed)
Addended by: Rene Paci A on: 05/03/2012 04:02 PM   Modules accepted: Orders

## 2012-05-06 ENCOUNTER — Observation Stay (HOSPITAL_COMMUNITY): Payer: Medicare Other

## 2012-05-06 ENCOUNTER — Encounter (HOSPITAL_COMMUNITY): Payer: Self-pay

## 2012-05-06 ENCOUNTER — Inpatient Hospital Stay (HOSPITAL_COMMUNITY)
Admission: RE | Admit: 2012-05-06 | Discharge: 2012-05-10 | DRG: 442 | Disposition: A | Discharge status: Home / Self Care | Payer: Medicare Other | Source: Ambulatory Visit | Attending: Family Medicine | Admitting: Family Medicine

## 2012-05-06 ENCOUNTER — Encounter: Payer: Self-pay | Admitting: Internal Medicine

## 2012-05-06 ENCOUNTER — Other Ambulatory Visit: Payer: Self-pay | Admitting: Physician Assistant

## 2012-05-06 VITALS — BP 119/64 | HR 55 | Temp 98.0°F | Resp 16 | Ht 63.0 in | Wt 132.9 lb

## 2012-05-06 DIAGNOSIS — L2089 Other atopic dermatitis: Secondary | ICD-10-CM

## 2012-05-06 DIAGNOSIS — G47429 Narcolepsy in conditions classified elsewhere without cataplexy: Secondary | ICD-10-CM

## 2012-05-06 DIAGNOSIS — T50995A Adverse effect of other drugs, medicaments and biological substances, initial encounter: Secondary | ICD-10-CM

## 2012-05-06 DIAGNOSIS — F3289 Other specified depressive episodes: Secondary | ICD-10-CM | POA: Diagnosis present

## 2012-05-06 DIAGNOSIS — K219 Gastro-esophageal reflux disease without esophagitis: Secondary | ICD-10-CM | POA: Diagnosis present

## 2012-05-06 DIAGNOSIS — R1013 Epigastric pain: Secondary | ICD-10-CM

## 2012-05-06 DIAGNOSIS — F22 Delusional disorders: Secondary | ICD-10-CM

## 2012-05-06 DIAGNOSIS — F419 Anxiety disorder, unspecified: Secondary | ICD-10-CM

## 2012-05-06 DIAGNOSIS — G4700 Insomnia, unspecified: Secondary | ICD-10-CM

## 2012-05-06 DIAGNOSIS — E2839 Other primary ovarian failure: Secondary | ICD-10-CM | POA: Diagnosis present

## 2012-05-06 DIAGNOSIS — D649 Anemia, unspecified: Secondary | ICD-10-CM | POA: Diagnosis present

## 2012-05-06 DIAGNOSIS — F411 Generalized anxiety disorder: Secondary | ICD-10-CM | POA: Diagnosis present

## 2012-05-06 DIAGNOSIS — Z9889 Other specified postprocedural states: Secondary | ICD-10-CM

## 2012-05-06 DIAGNOSIS — E860 Dehydration: Secondary | ICD-10-CM

## 2012-05-06 DIAGNOSIS — Q613 Polycystic kidney, unspecified: Secondary | ICD-10-CM

## 2012-05-06 DIAGNOSIS — R1110 Vomiting, unspecified: Secondary | ICD-10-CM

## 2012-05-06 DIAGNOSIS — M899 Disorder of bone, unspecified: Secondary | ICD-10-CM

## 2012-05-06 DIAGNOSIS — R000 Tachycardia, unspecified: Secondary | ICD-10-CM

## 2012-05-06 DIAGNOSIS — E559 Vitamin D deficiency, unspecified: Secondary | ICD-10-CM

## 2012-05-06 DIAGNOSIS — M47812 Spondylosis without myelopathy or radiculopathy, cervical region: Secondary | ICD-10-CM

## 2012-05-06 DIAGNOSIS — E876 Hypokalemia: Secondary | ICD-10-CM

## 2012-05-06 DIAGNOSIS — I10 Essential (primary) hypertension: Secondary | ICD-10-CM

## 2012-05-06 DIAGNOSIS — F329 Major depressive disorder, single episode, unspecified: Secondary | ICD-10-CM | POA: Diagnosis present

## 2012-05-06 DIAGNOSIS — R509 Fever, unspecified: Secondary | ICD-10-CM | POA: Diagnosis present

## 2012-05-06 DIAGNOSIS — N19 Unspecified kidney failure: Secondary | ICD-10-CM

## 2012-05-06 DIAGNOSIS — N959 Unspecified menopausal and perimenopausal disorder: Secondary | ICD-10-CM | POA: Diagnosis present

## 2012-05-06 DIAGNOSIS — R21 Rash and other nonspecific skin eruption: Secondary | ICD-10-CM

## 2012-05-06 DIAGNOSIS — N951 Menopausal and female climacteric states: Secondary | ICD-10-CM | POA: Diagnosis present

## 2012-05-06 DIAGNOSIS — K750 Abscess of liver: Principal | ICD-10-CM | POA: Diagnosis present

## 2012-05-06 DIAGNOSIS — E785 Hyperlipidemia, unspecified: Secondary | ICD-10-CM | POA: Diagnosis present

## 2012-05-06 DIAGNOSIS — Z0000 Encounter for general adult medical examination without abnormal findings: Secondary | ICD-10-CM

## 2012-05-06 DIAGNOSIS — R609 Edema, unspecified: Secondary | ICD-10-CM

## 2012-05-06 DIAGNOSIS — E039 Hypothyroidism, unspecified: Secondary | ICD-10-CM | POA: Diagnosis present

## 2012-05-06 HISTORY — PX: OTHER SURGICAL HISTORY: SHX169

## 2012-05-06 HISTORY — DX: Hypokalemia: E87.6

## 2012-05-06 HISTORY — DX: Dehydration: E86.0

## 2012-05-06 LAB — CBC
MCV: 88 fL (ref 78.0–100.0)
Platelets: 627 10*3/uL — ABNORMAL HIGH (ref 150–400)
RDW: 13.4 % (ref 11.5–15.5)
WBC: 10.8 10*3/uL — ABNORMAL HIGH (ref 4.0–10.5)

## 2012-05-06 LAB — URINALYSIS, ROUTINE W REFLEX MICROSCOPIC
Bilirubin Urine: NEGATIVE
Hgb urine dipstick: NEGATIVE
Ketones, ur: NEGATIVE mg/dL
Specific Gravity, Urine: 1.011 (ref 1.005–1.030)
pH: 6.5 (ref 5.0–8.0)

## 2012-05-06 LAB — CBC WITH DIFFERENTIAL/PLATELET
Eosinophils Absolute: 0 10*3/uL (ref 0.0–0.7)
HCT: 31.6 % — ABNORMAL LOW (ref 36.0–46.0)
Hemoglobin: 10.5 g/dL — ABNORMAL LOW (ref 12.0–15.0)
Lymphs Abs: 0.5 10*3/uL — ABNORMAL LOW (ref 0.7–4.0)
MCH: 29.5 pg (ref 26.0–34.0)
Monocytes Absolute: 0.8 10*3/uL (ref 0.1–1.0)
Monocytes Relative: 4 % (ref 3–12)
Neutrophils Relative %: 94 % — ABNORMAL HIGH (ref 43–77)
RBC: 3.56 MIL/uL — ABNORMAL LOW (ref 3.87–5.11)

## 2012-05-06 LAB — MAGNESIUM: Magnesium: 1.8 mg/dL (ref 1.5–2.5)

## 2012-05-06 LAB — COMPREHENSIVE METABOLIC PANEL
AST: 14 U/L (ref 0–37)
Albumin: 2.5 g/dL — ABNORMAL LOW (ref 3.5–5.2)
CO2: 30 mEq/L (ref 19–32)
Calcium: 8.3 mg/dL — ABNORMAL LOW (ref 8.4–10.5)
Creatinine, Ser: 0.97 mg/dL (ref 0.50–1.10)
GFR calc non Af Amer: 57 mL/min — ABNORMAL LOW (ref >90)
Sodium: 133 mEq/L — ABNORMAL LOW (ref 135–145)
Total Protein: 6.1 g/dL (ref 6.0–8.3)

## 2012-05-06 LAB — PROTIME-INR
INR: 1.21 (ref 0.00–1.49)
Prothrombin Time: 15.6 seconds — ABNORMAL HIGH (ref 11.6–15.2)

## 2012-05-06 LAB — BASIC METABOLIC PANEL
Calcium: 8.5 mg/dL (ref 8.4–10.5)
GFR calc Af Amer: 68 mL/min — ABNORMAL LOW (ref >90)
GFR calc non Af Amer: 59 mL/min — ABNORMAL LOW (ref >90)
Glucose, Bld: 99 mg/dL (ref 70–99)
Sodium: 132 mEq/L — ABNORMAL LOW (ref 135–145)

## 2012-05-06 LAB — APTT: aPTT: 36 seconds (ref 24–37)

## 2012-05-06 MED ORDER — ESTROGENS, CONJUGATED 0.625 MG/GM VA CREA
1.0000 | TOPICAL_CREAM | Freq: Every day | VAGINAL | Status: DC
  Administered 2012-05-09: 1 via VAGINAL
  Filled 2012-05-06: qty 42.5

## 2012-05-06 MED ORDER — POTASSIUM CHLORIDE 20 MEQ PO PACK: 20.0000 meq | PACK | Freq: Two times a day (BID) | ORAL | Status: DC

## 2012-05-06 MED ORDER — ALUM & MAG HYDROXIDE-SIMETH 200-200-20 MG/5ML PO SUSP
30.0000 mL | Freq: Four times a day (QID) | ORAL | Status: DC | PRN
  Administered 2012-05-07: 30 mL via ORAL
  Filled 2012-05-06: qty 30

## 2012-05-06 MED ORDER — FLEET ENEMA 7-19 GM/118ML RE ENEM: 1.0000 | ENEMA | Freq: Once | RECTAL | Status: AC | PRN

## 2012-05-06 MED ORDER — ONDANSETRON HCL 4 MG/2ML IJ SOLN
INTRAMUSCULAR | Status: AC
  Administered 2012-05-06: 4 mg via INTRAVENOUS
  Filled 2012-05-06: qty 2

## 2012-05-06 MED ORDER — SODIUM CHLORIDE 0.9 % IJ SOLN
3.0000 mL | Freq: Two times a day (BID) | INTRAMUSCULAR | Status: DC
  Administered 2012-05-08 – 2012-05-10 (×4): 3 mL via INTRAVENOUS

## 2012-05-06 MED ORDER — FENTANYL CITRATE 0.05 MG/ML IJ SOLN
INTRAMUSCULAR | Status: AC
  Filled 2012-05-06: qty 6

## 2012-05-06 MED ORDER — FUROSEMIDE 40 MG PO TABS: 40.0000 mg | ORAL_TABLET | Freq: Every day | ORAL | Status: DC

## 2012-05-06 MED ORDER — PIPERACILLIN-TAZOBACTAM 3.375 G IVPB
3.3750 g | Freq: Three times a day (TID) | INTRAVENOUS | Status: DC
  Administered 2012-05-06 – 2012-05-08 (×5): 3.375 g via INTRAVENOUS
  Filled 2012-05-06 (×6): qty 50

## 2012-05-06 MED ORDER — ACETAMINOPHEN 325 MG PO TABS
ORAL_TABLET | ORAL | Status: AC
  Administered 2012-05-06: 650 mg via ORAL
  Filled 2012-05-06: qty 2

## 2012-05-06 MED ORDER — PIPERACILLIN-TAZOBACTAM 3.375 G IVPB: 3.3750 g | Freq: Two times a day (BID) | INTRAVENOUS | Status: DC

## 2012-05-06 MED ORDER — ALPRAZOLAM 0.5 MG PO TABS: 0.5000 mg | ORAL_TABLET | Freq: Three times a day (TID) | ORAL | Status: DC | PRN

## 2012-05-06 MED ORDER — METOPROLOL TARTRATE 100 MG PO TABS
100.0000 mg | ORAL_TABLET | Freq: Two times a day (BID) | ORAL | Status: DC
  Administered 2012-05-07 – 2012-05-10 (×6): 100 mg via ORAL
  Filled 2012-05-06 (×9): qty 1

## 2012-05-06 MED ORDER — CIPROFLOXACIN HCL 500 MG PO TABS
500.0000 mg | ORAL_TABLET | Freq: Two times a day (BID) | ORAL | Status: DC
  Filled 2012-05-06: qty 1

## 2012-05-06 MED ORDER — ACETAMINOPHEN 325 MG PO TABS
650.0000 mg | ORAL_TABLET | ORAL | Status: DC | PRN
  Administered 2012-05-06 – 2012-05-07 (×4): 650 mg via ORAL
  Filled 2012-05-06 (×3): qty 2

## 2012-05-06 MED ORDER — CITALOPRAM HYDROBROMIDE 20 MG PO TABS
20.0000 mg | ORAL_TABLET | Freq: Every day | ORAL | Status: DC
  Administered 2012-05-06 – 2012-05-10 (×5): 20 mg via ORAL
  Filled 2012-05-06 (×5): qty 1

## 2012-05-06 MED ORDER — ONDANSETRON HCL 4 MG PO TABS
4.0000 mg | ORAL_TABLET | Freq: Four times a day (QID) | ORAL | Status: DC | PRN
  Filled 2012-05-06: qty 1

## 2012-05-06 MED ORDER — POTASSIUM CHLORIDE 10 MEQ/100ML IV SOLN
10.0000 meq | Freq: Once | INTRAVENOUS | Status: AC
  Administered 2012-05-06: 10 meq via INTRAVENOUS
  Filled 2012-05-06: qty 100

## 2012-05-06 MED ORDER — ONDANSETRON HCL 4 MG/2ML IJ SOLN
4.0000 mg | Freq: Four times a day (QID) | INTRAMUSCULAR | Status: DC | PRN
  Filled 2012-05-06 (×5): qty 2

## 2012-05-06 MED ORDER — HYDROCODONE-ACETAMINOPHEN 5-325 MG PO TABS: 1.0000 | ORAL_TABLET | ORAL | Status: DC | PRN

## 2012-05-06 MED ORDER — SENNOSIDES-DOCUSATE SODIUM 8.6-50 MG PO TABS
1.0000 | ORAL_TABLET | Freq: Every evening | ORAL | Status: DC | PRN
  Filled 2012-05-06: qty 1

## 2012-05-06 MED ORDER — POTASSIUM CHLORIDE 20 MEQ/15ML (10%) PO LIQD: 20.0000 meq | Freq: Two times a day (BID) | ORAL | Status: DC

## 2012-05-06 MED ORDER — VITAMIN C 250 MG PO TABS
250.0000 mg | ORAL_TABLET | Freq: Every day | ORAL | Status: DC
  Administered 2012-05-06 – 2012-05-10 (×5): 250 mg via ORAL
  Filled 2012-05-06 (×5): qty 1

## 2012-05-06 MED ORDER — POTASSIUM CHLORIDE CRYS ER 20 MEQ PO TBCR
40.0000 meq | EXTENDED_RELEASE_TABLET | ORAL | Status: AC
  Administered 2012-05-06 – 2012-05-07 (×3): 40 meq via ORAL
  Filled 2012-05-06 (×3): qty 2

## 2012-05-06 MED ORDER — MORPHINE SULFATE 2 MG/ML IJ SOLN: 1.0000 mg | INTRAMUSCULAR | Status: DC | PRN

## 2012-05-06 MED ORDER — VITAMIN C 100 MG PO TABS: 100.0000 mg | ORAL_TABLET | Freq: Every day | ORAL | Status: DC

## 2012-05-06 MED ORDER — OXYCODONE HCL 5 MG PO TABS
5.0000 mg | ORAL_TABLET | ORAL | Status: DC | PRN
  Administered 2012-05-07: 5 mg via ORAL
  Filled 2012-05-06: qty 1

## 2012-05-06 MED ORDER — CIPROFLOXACIN IN D5W 400 MG/200ML IV SOLN
400.0000 mg | Freq: Once | INTRAVENOUS | Status: AC
  Administered 2012-05-06: 400 mg via INTRAVENOUS
  Filled 2012-05-06: qty 200

## 2012-05-06 MED ORDER — FENTANYL CITRATE 0.05 MG/ML IJ SOLN
INTRAMUSCULAR | Status: AC | PRN
  Administered 2012-05-06 (×2): 50 ug via INTRAVENOUS

## 2012-05-06 MED ORDER — POTASSIUM CHLORIDE 10 MEQ/100ML IV SOLN
10.0000 meq | INTRAVENOUS | Status: AC
  Administered 2012-05-06 – 2012-05-07 (×6): 10 meq via INTRAVENOUS
  Filled 2012-05-06 (×6): qty 100

## 2012-05-06 MED ORDER — ZOLPIDEM TARTRATE 10 MG PO TABS
10.0000 mg | ORAL_TABLET | Freq: Every evening | ORAL | Status: DC | PRN
  Administered 2012-05-06 – 2012-05-09 (×4): 10 mg via ORAL
  Filled 2012-05-06 (×4): qty 1

## 2012-05-06 MED ORDER — ATORVASTATIN CALCIUM 40 MG PO TABS
40.0000 mg | ORAL_TABLET | Freq: Every day | ORAL | Status: DC
  Administered 2012-05-06 – 2012-05-09 (×4): 40 mg via ORAL
  Filled 2012-05-06 (×5): qty 1

## 2012-05-06 MED ORDER — SODIUM CHLORIDE 0.9 % IV SOLN: INTRAVENOUS | Status: DC

## 2012-05-06 MED ORDER — SODIUM CHLORIDE 0.9 % IV BOLUS (SEPSIS): 250.0000 mL | Freq: Once | INTRAVENOUS | Status: DC

## 2012-05-06 MED ORDER — MIDAZOLAM HCL 2 MG/2ML IJ SOLN
INTRAMUSCULAR | Status: AC
  Filled 2012-05-06: qty 6

## 2012-05-06 MED ORDER — BISACODYL 5 MG PO TBEC: 5.0000 mg | DELAYED_RELEASE_TABLET | Freq: Every day | ORAL | Status: DC | PRN

## 2012-05-06 MED ORDER — SODIUM CHLORIDE 0.9 % IV BOLUS (SEPSIS)
500.0000 mL | Freq: Once | INTRAVENOUS | Status: AC
  Administered 2012-05-06: 1000 mL via INTRAVENOUS

## 2012-05-06 MED ORDER — LISINOPRIL 20 MG PO TABS
20.0000 mg | ORAL_TABLET | Freq: Every day | ORAL | Status: DC
  Administered 2012-05-06 – 2012-05-10 (×5): 20 mg via ORAL
  Filled 2012-05-06 (×5): qty 1

## 2012-05-06 MED ORDER — ONDANSETRON HCL 4 MG/2ML IJ SOLN
4.0000 mg | Freq: Four times a day (QID) | INTRAMUSCULAR | Status: DC
  Administered 2012-05-06 – 2012-05-09 (×10): 4 mg via INTRAVENOUS
  Filled 2012-05-06 (×3): qty 2

## 2012-05-06 MED ORDER — PANTOPRAZOLE SODIUM 40 MG PO TBEC
80.0000 mg | DELAYED_RELEASE_TABLET | Freq: Every day | ORAL | Status: DC
  Administered 2012-05-07 – 2012-05-10 (×4): 80 mg via ORAL
  Filled 2012-05-06: qty 2
  Filled 2012-05-06 (×2): qty 1
  Filled 2012-05-06 (×3): qty 2

## 2012-05-06 MED ORDER — AMLODIPINE BESYLATE 5 MG PO TABS
5.0000 mg | ORAL_TABLET | Freq: Every day | ORAL | Status: DC
  Administered 2012-05-06 – 2012-05-10 (×5): 5 mg via ORAL
  Filled 2012-05-06 (×5): qty 1

## 2012-05-06 MED ORDER — CALCIUM CARBONATE 1250 (500 CA) MG PO TABS
1.0000 | ORAL_TABLET | Freq: Every day | ORAL | Status: DC
  Administered 2012-05-06 – 2012-05-10 (×5): 500 mg via ORAL
  Filled 2012-05-06 (×5): qty 1

## 2012-05-06 MED ORDER — SERTRALINE HCL 100 MG PO TABS: 100.0000 mg | ORAL_TABLET | Freq: Every day | ORAL | Status: DC

## 2012-05-06 MED ORDER — METRONIDAZOLE 500 MG PO TABS
500.0000 mg | ORAL_TABLET | Freq: Three times a day (TID) | ORAL | Status: DC
  Filled 2012-05-06: qty 1

## 2012-05-06 MED ORDER — MIDAZOLAM HCL 5 MG/5ML IJ SOLN
INTRAMUSCULAR | Status: AC | PRN
  Administered 2012-05-06: 2 mg via INTRAVENOUS

## 2012-05-06 MED ORDER — ESTROGENS CONJUGATED 0.625 MG PO TABS
0.6250 mg | ORAL_TABLET | Freq: Every day | ORAL | Status: DC
  Administered 2012-05-07 – 2012-05-10 (×4): 0.625 mg via ORAL
  Filled 2012-05-06 (×4): qty 1

## 2012-05-06 MED ORDER — POTASSIUM CHLORIDE IN NACL 40-0.9 MEQ/L-% IV SOLN
INTRAVENOUS | Status: DC
  Administered 2012-05-06 – 2012-05-07 (×2): via INTRAVENOUS
  Filled 2012-05-06 (×3): qty 1000

## 2012-05-06 NOTE — Procedures (Signed)
Successful placement of a 10 French drain into the abscess/fluid collection within the posterior segment of the right lobe of the liver. Approximately 90 mL of purulent fluid aspirated. Samples sent to lab as requested. No immediate post procedural complications.

## 2012-05-06 NOTE — H&P (Signed)
Patient with hepatic abscess initially being treated with po antibiotics over weekend.  Presented today as OP for percutaneous drainage of hepatic abscess.  Approximately 90 ml of purulent drainage aspirated from collection. Patient had immediate temperature spike, nausea with vomiting following the procedure.  Plan for admission for observation overnight. Fluid sent to the laboratory for analysis. Dr. Felicity Coyer, patient's PCP advised of patient's admit who requested we call the hospitalists for admission assistance.

## 2012-05-06 NOTE — Progress Notes (Signed)
Pt temp 102.5, BP 129/52, HR 84.  MD notified.

## 2012-05-06 NOTE — Progress Notes (Signed)
ANTIBIOTIC CONSULT NOTE - INITIAL  Pharmacy Consult for Zosyn Indication: hepatic abscess  Allergies  Allergen Reactions  . Codeine   . Sulfonamide Derivatives     Patient Measurements:   Body Weight: 60.3 kg (05/01/12)  Vital Signs: Temp: 102.5 F (39.2 C) (09/16 1600) Temp src: Oral (09/16 1600) BP: 129/52 mmHg (09/16 1600) Pulse Rate: 84  (09/16 1600)  Labs:  Basename 05/06/12 1134 05/06/12 1107  WBC -- 10.8*  HGB -- 11.1*  PLT -- 627*  LABCREA -- --  CREATININE 0.94 --    Microbiology: No results found for this or any previous visit (from the past 720 hour(s)).  Medical History: Past Medical History  Diagnosis Date  . Hyperlipidemia   . Hypertension   . Polycystic kidney disease   . Anxiety   . RENAL FAILURE   . 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 HYPOTHYROIDISM   . GERD   . VITAMIN D DEFICIENCY   . OSTEOPENIA   . INSOMNIA   . Cervical spine degeneration     Severe by CT April 2012    Assessment: 66 YOF with hepatic abscess, presented for percutaneous drainage but had fever and N/V following procedure.  Fluid sent for cxs.  To begin Zosyn empirically.  Patient was receiving PO Cipro PTA for abscess.  CrCl>30 ml/min.  Goal of Therapy:  dose adjusted per renal clearance  Plan:  Zosyn 3.375g IV q8h (4 hour infusion time).  Clance Boll 05/06/2012,6:06 PM

## 2012-05-06 NOTE — Sedation Documentation (Signed)
Ct scanner went down, had to be rebooted.  Pt reassured and made comfortable while waiting for scanner to come back up

## 2012-05-06 NOTE — Progress Notes (Unsigned)
Patient ID: Alyssa Fernandez, female   DOB: July 20, 1941, 71 y.o.   MRN: [id number]  comiong from IR H/O PCOS, on CT outpt was found to have liver abscess 2 days ago, came for outpt Hep drain placement by IR, drain placed by IR, had rigors, K 2.3, was asked to admit for ABX, K replacement, ID consult.

## 2012-05-06 NOTE — H&P (Signed)
[facility] History and Physical  Alyssa Fernandez:[id number] DOB: 07-Mar-1941 DOA: 05/06/2012  Referring physician: Dr. Simonne Come PCP: Oliver Barre, MD   Chief Complaint: Fever, chills, right upper quadrant pain after placement of a hepatic drain.  HPI: Alyssa Fernandez is a 71 y.o. female with past medical history of hypertension, polycystic kidney disease, anxiety, hypothyroidism, gastroesophageal reflux disease who had presented to interventional radiology on the day of admission for drain placement secondary to a hepatic abscess. Patient states that developed a fever of 102, right upper quadrant pain, right-sided bloating 7-10 days prior to admission which was progressively getting worse and a such subsequently presented to her PCPs office to be assessed. Patient states she was seen in the PCPs office were CT of the abdomen and pelvis were done which was concerning for hepatic abscess. Patient stated that her PCPs office called her at home 3 days prior to admission asking her to present to the ED for admission and further evaluation however patient stated that she refused as she was starting to feel better denied any emesis as stated her abdominal pain was improving. Patient was subsequently called and sent to interventional radiology for hepatic drain placement. Patient states prior to admission was being treated empirically at home with oral Cipro and Flagyl. Patient denies any shortness of breath, denies any headache, no chest pain, no nausea, no dysuria, patient does endorse a nonproductive cough, an episode of diarrhea, an episode of emesis after drinking oral contrast. Postprocedure patient was noted to spike a fever 102, have nausea and vomiting. Will call to admit the patient for further evaluation and management.   Review of Systems: The patient denies anorexia, fever, weight loss,, vision loss, decreased hearing, hoarseness, chest pain, syncope, dyspnea on exertion, peripheral edema,  balance deficits, hemoptysis, abdominal pain, melena, hematochezia, severe indigestion/heartburn, hematuria, incontinence, genital sores, muscle weakness, suspicious skin lesions, transient blindness, difficulty walking, depression, unusual weight change, abnormal bleeding, enlarged lymph nodes, angioedema, and breast masses.    Past Medical History  Diagnosis Date  . Hyperlipidemia   . Hypertension   . Polycystic kidney disease   . Anxiety   . RENAL FAILURE   . 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 HYPOTHYROIDISM   . GERD   . VITAMIN D DEFICIENCY   . OSTEOPENIA   . INSOMNIA   . Cervical spine degeneration     Severe by CT April 2012   Past Surgical History  Procedure Date  . Partial nephrectomy   . Abdominal hysterectomy   . Cervical disc surgery   . Bunionectomy   . Fx rt ankle   . Left temporal bx artery-negative 2    . Bladder operation, but per sling procedure   . 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 Percutaneous drainage of liver abscess 05/06/2012   Social History:  reports that she has never smoked. She has never used smokeless tobacco. She reports that she does not drink alcohol or use illicit drugs.  Allergies  Allergen Reactions  . Codeine   . Sulfonamide Derivatives     Family History  Problem Relation Age of Onset  . Stroke Mother      Prior to Admission medications   Medication Sig Start Date End Date Taking? Authorizing Provider  ALPRAZolam Prudy Feeler) 0.5 MG tablet Take 1 tablet (0.5 mg total) by mouth 3 (three) times daily as needed. 12/28/10  Yes Damian Leavell., MD  amLODipine (NORVASC) 5 MG tablet Take 1  tablet (5 mg total) by mouth daily. 02/16/12 02/15/13 Yes Corwin Levins, MD  Ascorbic Acid (VITAMIN C) 100 MG tablet Take 100 mg by mouth daily.     Yes Historical Provider, MD  calcium carbonate (OS-CAL) 1250 MG tablet Take 1 tablet by mouth daily.     Yes Historical Provider, MD  ciprofloxacin (CIPRO) 500 MG tablet Take 1 tablet (500 mg  total) by mouth 2 (two) times daily. 05/01/12 05/11/12 Yes Newt Lukes, MD  citalopram (CELEXA) 20 MG tablet TAKE ONE TABLET EACH DAY FOR DEPRESSION-MAY INCREASE TO 2 TABLETS DAILY 01/23/12  Yes Corwin Levins, MD  Cyanocobalamin (VITAMIN B 12 PO) Take by mouth.     Yes Historical Provider, MD  estrogens, conjugated, (PREMARIN) 0.625 MG tablet Take 1 tablet (0.625 mg total) by mouth daily. Take daily for 21 days then do not take for 7 days. 01/30/12  Yes Corwin Levins, MD  furosemide (LASIX) 40 MG tablet Take 1 tablet (40 mg total) by mouth daily. 02/16/12  Yes Corwin Levins, MD  HYDROcodone-acetaminophen (VICODIN) 5-500 MG per tablet Take 1 tablet by mouth every 4 (four) hours as needed for pain. Max of 4 tablets per day. 06/07/11  Yes Damian Leavell., MD  lisinopril (PRINIVIL,ZESTRIL) 20 MG tablet TAKE ONE TABLET EACH DAY FOR EDEMA AND  BLOOD PRESSURE AND KIDNEY PROTECTION 01/23/12  Yes Corwin Levins, MD  metoprolol (LOPRESSOR) 100 MG tablet TAKE ONE TABLET TWICE DAILY 01/09/12  Yes Corwin Levins, MD  metroNIDAZOLE (FLAGYL) 500 MG tablet Take 1 tablet (500 mg total) by mouth 3 (three) times daily. 05/01/12 05/11/12 Yes Newt Lukes, MD  omeprazole-sodium bicarbonate (ZEGERID) 40-1100 MG per capsule TAKE ONE CAPSULE EACH DAY 01/01/12  Yes Corwin Levins, MD  PREMARIN vaginal cream INSERT 1/3 TO 1/2 APPLICATOR DAILY AT   BEDTIME 03/25/12  Yes Corwin Levins, MD  sertraline (ZOLOFT) 100 MG tablet TAKE ONE TABLET TWICE DAILY 03/25/12  Yes Corwin Levins, MD  simvastatin (ZOCOR) 80 MG tablet TAKE ONE-HALF TABLET AT BEDTIME 03/25/12  Yes Corwin Levins, MD  ZOVIRAX 5 % cream APPLY TWICE DAILY 04/17/11  Yes Damian Leavell., MD  potassium chloride (KLOR-CON) 20 MEQ packet Take 20 mEq by mouth 2 (two) times daily. 04/01/12   Corwin Levins, MD  zolpidem (AMBIEN) 10 MG tablet Take 1 tablet (10 mg total) by mouth at bedtime as needed for sleep. 11/16/11 12/16/11  Kristian Covey, MD  zolpidem (AMBIEN) 10 MG  tablet Take 1 tablet (10 mg total) by mouth at bedtime as needed for sleep. 02/01/12 03/02/12  Corwin Levins, MD   Physical Exam: Filed Vitals:   05/06/12 1510 05/06/12 1520 05/06/12 1530 05/06/12 1600  BP: 100/48 105/50 105/45 129/52  Pulse: 86 83 81 84  Temp: 101.3 F (38.5 C)  99.3 F (37.4 C) 102.5 F (39.2 C)  TempSrc:    Oral  Resp: 12 12 12 16   SpO2: 95% 95% 98% 98%     General:  Well-developed well-nourished in no acute cardiopulmonary distress.  Eyes: Pupils equal round and reactive to light and accommodation. Extraocular movements intact.  ENT: Oropharynx is clear, no lesions, no exudates.  Neck: Supple with no lymphadenopathy.  Cardiovascular: Regular rate rhythm no murmurs rubs or gallops. No lower extremity edema. No JVD.  Respiratory: Clear to auscultation bilaterally. No crackles, no wheezes, no rhonchi.  Abdomen: Soft, nontender, nondistended, positive bowel sounds. Drain site clean dry  and intact with a bandage over it.  Skin: No rashes, no lesions.  Musculoskeletal: 5 out of 5 bilateral upper extremity strength. 5 bilateral lower extremity strength.  Psychiatric: Normal mood. Normal affect. Good insight. Good judgment.  Neurologic: Alert and onto x3. Greater than 2 through 12 are grossly intact. No focal deficits.  Labs on Admission:  Basic Metabolic Panel:  Lab 05/06/12 3244 05/01/12 1453  NA 132* 135  K 2.3* 3.3*  CL 90* 95*  CO2 31 30  GLUCOSE 99 94  BUN 11 13  CREATININE 0.94 0.9  CALCIUM 8.5 8.3*  MG -- --  PHOS -- --   Liver Function Tests:  Lab 05/01/12 1453  AST 14  ALT 8  ALKPHOS 68  BILITOT 1.1  PROT 6.0  ALBUMIN 2.6*   No results found for this basename: LIPASE:5,AMYLASE:5 in the last 168 hours No results found for this basename: AMMONIA:5 in the last 168 hours CBC:  Lab 05/06/12 1107 05/01/12 1453  WBC 10.8* 11.0*  NEUTROABS -- 9.1*  HGB 11.1* 10.5*  HCT 32.9* 31.7*  MCV 88.0 90.6  PLT 627* 360.0   Cardiac  Enzymes: No results found for this basename: CKTOTAL:5,CKMB:5,CKMBINDEX:5,TROPONINI:5 in the last 168 hours  BNP (last 3 results) No results found for this basename: PROBNP:3 in the last 8760 hours CBG: No results found for this basename: GLUCAP:5 in the last 168 hours  Radiological Exams on Admission: Ct Guided Abscess Drain  05/06/2012  *RADIOLOGY REPORT*  Indication: Infected hepatic cyst, now with hepatic abscess.  CT GUIDED HEPATIC ABSCESS DRAINAGE CATHETER PLACEMENT  Comparison: CT abdomen pelvis - 05/03/2012  Medications: Fentanyl 100 mcg IV; Versed 2 mg IV; Ciprofloxacin 400 mg IV  Total Moderate Sedation time: 42 minutes (procedure artificially lengthened secondary to the CT malfunctioning requiring rebooting).  Contrast: None  Complications: The patient developed post drain placement rigors and nausea which subsided with the administration of fluid bolus and the IV Zofran.  Technique / Findings:  Informed written consent was obtained from the patient after a discussion of the risks, benefits and alternatives to treatment. The patient was placed supine on the CT gantry and a pre procedural CT was performed re-demonstrating the known abscess/fluid collection within the medial aspect of the posterior segment of the right lobe the liver.  The procedure was planned.   A timeout was performed prior to the initiation of the procedure.  The lateral aspect of the right upper abdomen was prepped and draped in the usual sterile fashion.   The overlying soft tissues were anesthetized with 1% lidocaine with epinephrine.  A 5 [place] Yueh sheath need was advanced in to the abscess/fluid collection resulting in aspiration of a small amount of purulent fluid.  As such, a short Amplatz super stiff wire was coiled within the abscess/fluid collection.   Appropriate positioning was confirmed with a limited CT scan.  The tract was serially dilated allowing placement of a 10 [place] all-purpose drainage catheter.  Appropriate positioning was confirmed with a limited postprocedural CT scan.  90 ml of purulent fluid was aspirated.  The tube was connected to a drainage bag and sutured in place.  A dressing was placed.  As stated above, despite preprocedural antibiotics, the patient developed post procedural rigors and nausea which resolved with fluid bolus and IV Zofran.  Impression:  Successful CT guided placement of a 10 French all purpose drain catheter into the abscess within the medial aspect the posterior segment right lobe of the liver with aspiration  of 90 mL of purulent fluid.  Samples were sent to the laboratory as requested by the ordering clinical team.  Plan:  Given the development of postprocedural rigors, the patient will be admitted to the hospital for continued monitoring and the administration of intravenous antibiotics.   Original Report Authenticated By: Waynard Reeds, M.D.    Dg Chest Port 1 View  05/06/2012  *RADIOLOGY REPORT*  Clinical Data: Fever.  Rule out pneumonia  PORTABLE CHEST - 1 VIEW  Comparison: 05/01/2012  Findings: Heart size is upper normal.  Negative for heart failure. Lungs are clear without infiltrate or effusion.  Negative for pneumonia. Pigtail catheter in the liver.  IMPRESSION: No acute abnormality.   Original Report Authenticated By: Camelia Phenes, M.D.     EKG: None  Assessment/Plan Principal Problem:  *Hepatic abscess Active Problems:  HYPOTHYROIDISM  VITAMIN D DEFICIENCY  HYPERLIPIDEMIA  ESSENTIAL HYPERTENSION  GERD  POSTMENOPAUSAL SYNDROME  POLYCYSTIC KIDNEY DISEASE  Hypokalemia  Fever  Dehydration   #1 hepatic abscess status post perc drain 05/06/2012 Questionable etiology. Patient is status post percutaneous drain of the abscess today. Will check blood cultures x2. Cultures of the aspirate are pending. Will place empirically on IV Zosyn. We'll consult with ID for further evaluation and management.  #2 fever Likely secondary to problem #1. Aspirate  cultures are pending. Will check blood cultures x2. Check a UA with cultures and sensitivity. Check a chest x-ray. We'll place empirically on IV Zosyn and follow.  #3 gastroesophageal reflux disease Continue home dose PPI  #4 hypokalemia Likely secondary to GI losses. Will check a magnesium level. Will replete orally and with IV fluids. Monitor closely.  #5 dehydration IV fluids  #6 hyperlipidemia Continue home regimen of statin  #7 depression/anxiety  continue home dose Celexa and Xanax.  #8 polycystic kidney disease Stable.  #9 prophylaxis PPI for GI prophylaxis. SCDs for DVT prophylaxis.  Code Status: full Family Communication: Updated patient. Disposition Plan: Admit to telemetry  Time spent: 65 mins  [facility] Pager [phone]  If 7PM-7AM, please contact night-coverage [email or web address] Password [facility] 05/06/2012, 6:16 PM

## 2012-05-06 NOTE — H&P (Signed)
Alyssa Fernandez is an 71 y.o. female.   Chief Complaint: hepatic abscess. Patient presents today for percutaneous drainage of this abscess.  HPI: see office note below from primary MD.  Newt Lukes, MD Physician Signed  Progress Notes 05/01/2012 2:26 PM  Related encounter: Office Visit from 05/01/2012 in [facility]     Subjective:    Patient ID: Alyssa Fernandez, female    DOB: 07/23/1941, 71 y.o.   MRN: [id number]  HPI  See CC Onset of symptoms 10 days ago, progressively worse Associated with weakness and fatigue, then vomiting and abdominal pain with fever x last 4 days (somewhat better in last 24 hours) Also pain in right anterior chest with inspiration and dry cough Denies diarrhea, dysuria, hematuria No trauma or travel No history of same Tolerating only liquids, concerned for dehydration with history polycystic kidney disease    Past Medical History   Diagnosis  Date   .  Hyperlipidemia     .  Hypertension     .  Polycystic kidney disease     .  Anxiety     .  RENAL FAILURE     .  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  HYPOTHYROIDISM     .  GERD     .  VITAMIN D DEFICIENCY     .  OSTEOPENIA     .  INSOMNIA     .  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  HENT: Negative for nosebleeds, congestion, sore throat, rhinorrhea, trouble swallowing, neck pain, neck stiffness, postnasal drip and sinus pressure.   Respiratory: Positive for cough. Negative for chest tightness and wheezing.   Cardiovascular: Positive for chest pain. Negative for leg swelling.  Gastrointestinal: Positive for vomiting, abdominal pain and abdominal distention. Negative for nausea, diarrhea and constipation.  Genitourinary: Negative for dysuria and flank pain.  Musculoskeletal: Negative for back pain and joint swelling.  Skin: Negative  for rash and wound.  Neurological: Positive for weakness. Negative for syncope and headaches.      Objective:   Physical Exam  BP 130/80  Pulse 88  Temp 98.4 F (36.9 C) (Oral)  Ht 5\' 4"  (1.626 m)  Wt 133 lb (60.328 kg)  BMI 22.83 kg/m2  SpO2 97% Wt Readings from Last 3 Encounters:   05/01/12  133 lb (60.328 kg)   02/16/12  142 lb 4 oz (64.524 kg)   11/20/11  139 lb 4 oz (63.163 kg)    Constitutional: She appears fatigued and ill but no acute distress. spouse at side HENT: Head: Normocephalic and atraumatic. Ears: B TMs ok, no erythema or effusion; Nose: Nose normal. Mouth/Throat: Oropharynx is clear and moist. No oropharyngeal exudate.  Eyes: Conjunctivae and EOM are normal. Pupils are equal, round, and reactive to light. No scleral icterus.  Neck: Normal range of motion. Neck supple. No JVD present. No thyromegaly present.  Cardiovascular: Normal rate, regular rhythm and normal heart sounds.  No murmur heard. No BLE edema. Pulmonary/Chest: Effort normal and breath sounds diminished at bases. No respiratory distress. She has no wheezes.  Abdominal: soft but with diminished bowel sounds. There is RUQ tenderness. 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 No gross deformities Neurological: She is alert and oriented to person, place,  and time. No cranial nerve deficit. Coordination normal.  Skin: Skin is warm and dry. No rash noted. No erythema.      Lab Results   Component  Value  Date     WBC  5.2  11/20/2011     HGB  12.6  11/20/2011     HCT  37.4  11/20/2011     PLT  227.0  11/20/2011     GLUCOSE  93  11/20/2011     CHOL  153  11/20/2011     TRIG  110.0  11/20/2011     HDL  54.00  11/20/2011     LDLCALC  77  11/20/2011     ALT  10  11/20/2011     AST  16  11/20/2011     NA  139  11/20/2011     K  3.7  11/20/2011     CL  101  11/20/2011     CREATININE  1.1  11/20/2011     BUN  21  11/20/2011     CO2  30  11/20/2011     TSH  0.98  11/20/2011     HGBA1C   5.9  05/19/2008      Echo 02/2012: ------------------------------------------------------------ Study Conclusions  - Left ventricle: The cavity size was normal. Wall thickness was normal. Systolic function was vigorous. The estimated ejection fraction was in the range of 65% to 70%. Wall motion was normal; there were no regional wall motion abnormalities. Doppler parameters are consistent with abnormal left ventricular relaxation (grade 1 diastolic dysfunction). - Mitral valve: Mild regurgitation. - Atrial septum: No defect or patent foramen ovale was identified. - Pulmonary arteries: PA peak pressure: 32mm Hg (S).     Assessment & Plan:    Epigastric and RUQ pain vomiting and fever >72h, currently improved Dry cough with right side pleurisy PCKD  Check labs and CXR today Plan CT a/p to look for diverticulitis or other GI infection/problem (unless CXR with RLL PNA) Plan antibiotics - cipro and flagyl unless abnormal CXR suggests need for broader coverage Pt declines need for pain meds Hydrate and tylenol, call if worse or unimproved     Today's examination :   Past Medical History  Diagnosis Date  . Hyperlipidemia   . Hypertension   . Polycystic kidney disease   . Anxiety   . RENAL FAILURE   . 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 HYPOTHYROIDISM   . GERD   . VITAMIN D DEFICIENCY   . OSTEOPENIA   . INSOMNIA   . Cervical spine degeneration     Severe by CT April 2012    Past Surgical History  Procedure Date  . Partial nephrectomy   . Abdominal hysterectomy   . Cervical disc surgery   . Bunionectomy   . Fx rt ankle   . Left temporal bx artery-negative 2    . Bladder operation, but per sling procedure   . 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 Stroke Mother    Social History:  reports that she has never smoked. She has never used smokeless tobacco. She reports that she does not drink alcohol or use  illicit drugs.  Allergies:  Allergies  Allergen Reactions  . Codeine   . Sulfonamide Derivatives      Medication List     As of 05/06/2012 12:26 PM    ASK your doctor about these  medications         ALPRAZolam 0.5 MG tablet   Commonly known as: XANAX   Take 1 tablet (0.5 mg total) by mouth 3 (three) times daily as needed.      amLODipine 5 MG tablet   Commonly known as: NORVASC   Take 1 tablet (5 mg total) by mouth daily.      calcium carbonate 1250 MG tablet   Commonly known as: OS-CAL - dosed in mg of elemental calcium   Take 1 tablet by mouth daily.      ciprofloxacin 500 MG tablet   Commonly known as: CIPRO   Take 1 tablet (500 mg total) by mouth 2 (two) times daily.      citalopram 20 MG tablet   Commonly known as: CELEXA   TAKE ONE TABLET EACH DAY FOR DEPRESSION-MAY INCREASE TO 2 TABLETS DAILY      estrogens (conjugated) 0.625 MG tablet   Commonly known as: PREMARIN   Take 1 tablet (0.625 mg total) by mouth daily. Take daily for 21 days then do not take for 7 days.      furosemide 40 MG tablet   Commonly known as: LASIX   Take 1 tablet (40 mg total) by mouth daily.      HYDROcodone-acetaminophen 5-500 MG per tablet   Commonly known as: VICODIN   Take 1 tablet by mouth every 4 (four) hours as needed for pain. Max of 4 tablets per day.      lisinopril 20 MG tablet   Commonly known as: PRINIVIL,ZESTRIL   TAKE ONE TABLET EACH DAY FOR EDEMA AND  BLOOD PRESSURE AND KIDNEY PROTECTION      metoprolol 100 MG tablet   Commonly known as: LOPRESSOR   TAKE ONE TABLET TWICE DAILY      metroNIDAZOLE 500 MG tablet   Commonly known as: FLAGYL   Take 1 tablet (500 mg total) by mouth 3 (three) times daily.      omeprazole-sodium bicarbonate 40-1100 MG per capsule   Commonly known as: ZEGERID   TAKE ONE CAPSULE EACH DAY      potassium chloride 20 MEQ packet   Commonly known as: KLOR-CON   Take 20 mEq by mouth 2 (two) times daily.      PREMARIN vaginal cream    Generic drug: conjugated estrogens   INSERT 1/3 TO 1/2 APPLICATOR DAILY AT   BEDTIME      sertraline 100 MG tablet   Commonly known as: ZOLOFT   TAKE ONE TABLET TWICE DAILY      simvastatin 80 MG tablet   Commonly known as: ZOCOR   TAKE ONE-HALF TABLET AT BEDTIME      VITAMIN B 12 PO   Take by mouth.      vitamin C 100 MG tablet   Take 100 mg by mouth daily.      zolpidem 10 MG tablet   Commonly known as: AMBIEN   Take 1 tablet (10 mg total) by mouth at bedtime as needed for sleep.      zolpidem 10 MG tablet   Commonly known as: AMBIEN   Take 1 tablet (10 mg total) by mouth at bedtime as needed for sleep.      ZOVIRAX 5 %   Generic drug: acyclovir cream   APPLY TWICE DAILY          Results for orders placed during the hospital encounter of 05/06/12 (from the past 48 hour(s))  APTT     Status:  Normal   Collection Time   05/06/12 11:07 AM      Component Value Range Comment   aPTT 36  24 - 37 seconds   CBC     Status: Abnormal   Collection Time   05/06/12 11:07 AM      Component Value Range Comment   WBC 10.8 (*) 4.0 - 10.5 K/uL    RBC 3.74 (*) 3.87 - 5.11 MIL/uL    Hemoglobin 11.1 (*) 12.0 - 15.0 g/dL    HCT 16.1 (*) 09.6 - 46.0 %    MCV 88.0  78.0 - 100.0 fL    MCH 29.7  26.0 - 34.0 pg    MCHC 33.7  30.0 - 36.0 g/dL    RDW 04.5  40.9 - 81.1 %    Platelets 627 (*) 150 - 400 K/uL   PROTIME-INR     Status: Abnormal   Collection Time   05/06/12 11:07 AM      Component Value Range Comment   Prothrombin Time 15.6 (*) 11.6 - 15.2 seconds    INR 1.21  0.00 - 1.49   BASIC METABOLIC PANEL     Status: Abnormal   Collection Time   05/06/12 11:34 AM      Component Value Range Comment   Sodium 132 (*) 135 - 145 mEq/L    Potassium 2.3 (*) 3.5 - 5.1 mEq/L    Chloride 90 (*) 96 - 112 mEq/L    CO2 31  19 - 32 mEq/L    Glucose, Bld 99  70 - 99 mg/dL    BUN 11  6 - 23 mg/dL    Creatinine, Ser 9.14  0.50 - 1.10 mg/dL    Calcium 8.5  8.4 - 78.2 mg/dL    GFR calc non Af  Amer 59 (*) >90 mL/min    GFR calc Af Amer 68 (*) >90 mL/min     Review of Systems  Constitutional: Positive for fever, chills, weight loss and malaise/fatigue.  HENT: Positive for neck pain.   Respiratory: Negative for cough and shortness of breath.   Cardiovascular: Negative for chest pain, palpitations and leg swelling.  Gastrointestinal: Positive for heartburn, nausea, vomiting and abdominal pain.  Genitourinary: Negative for hematuria and flank pain.  Skin: Positive for rash.  Neurological: Positive for weakness.  Psychiatric/Behavioral: Negative for depression and memory loss. The patient is nervous/anxious.     Blood pressure 93/51, pulse 67, temperature 98.7 F (37.1 C), temperature source Oral, resp. rate 16, SpO2 94.00%. Physical Exam  Constitutional: She is oriented to person, place, and time. She appears well-developed and well-nourished. No distress.  HENT:  Head: Normocephalic and atraumatic.  Cardiovascular: Normal rate, regular rhythm and normal heart sounds.  Exam reveals no gallop and no friction rub.   No murmur heard. Respiratory: Effort normal and breath sounds normal. No respiratory distress. She has no wheezes. She has no rales. She exhibits no tenderness.  GI: Soft. Bowel sounds are normal. She exhibits no mass. There is tenderness. There is no guarding.  Musculoskeletal: Normal range of motion.  Neurological: She is alert and oriented to person, place, and time.  Skin: Skin is warm and dry.  Psychiatric: She has a normal mood and affect. Her behavior is normal. Judgment and thought content normal.     Assessment/Plan Procedure details for hepatic abscess needle aspiration and /or percutaneous drainage of same were discussed with patient in detail with her apparent understanding. Risks reviewed include but not limited to infection, bleeding,  organ damage, inadequate drainage and complications with moderate sedation. Questions answered to the patient's  satisfaction. Written consent obtained.  CAMPBELL,PAMELA D 05/06/2012, 12:26 PM

## 2012-05-07 DIAGNOSIS — K750 Abscess of liver: Secondary | ICD-10-CM

## 2012-05-07 DIAGNOSIS — D649 Anemia, unspecified: Secondary | ICD-10-CM | POA: Diagnosis present

## 2012-05-07 DIAGNOSIS — F22 Delusional disorders: Secondary | ICD-10-CM

## 2012-05-07 HISTORY — DX: Anemia, unspecified: D64.9

## 2012-05-07 LAB — COMPREHENSIVE METABOLIC PANEL
Albumin: 2.3 g/dL — ABNORMAL LOW (ref 3.5–5.2)
Alkaline Phosphatase: 61 U/L (ref 39–117)
BUN: 9 mg/dL (ref 6–23)
Creatinine, Ser: 0.95 mg/dL (ref 0.50–1.10)
Potassium: 4.1 mEq/L (ref 3.5–5.1)
Total Protein: 5.6 g/dL — ABNORMAL LOW (ref 6.0–8.3)

## 2012-05-07 LAB — CBC
HCT: 30.5 % — ABNORMAL LOW (ref 36.0–46.0)
Hemoglobin: 9.8 g/dL — ABNORMAL LOW (ref 12.0–15.0)
MCHC: 32.1 g/dL (ref 30.0–36.0)
MCV: 90.8 fL (ref 78.0–100.0)
RDW: 13.8 % (ref 11.5–15.5)
WBC: 13.7 10*3/uL — ABNORMAL HIGH (ref 4.0–10.5)

## 2012-05-07 LAB — DIFFERENTIAL
Lymphocytes Relative: 7 % — ABNORMAL LOW (ref 12–46)
Lymphs Abs: 0.9 10*3/uL (ref 0.7–4.0)
Monocytes Relative: 5 % (ref 3–12)
Neutro Abs: 12 10*3/uL — ABNORMAL HIGH (ref 1.7–7.7)
Neutrophils Relative %: 88 % — ABNORMAL HIGH (ref 43–77)

## 2012-05-07 LAB — URINE CULTURE
Colony Count: NO GROWTH
Culture: NO GROWTH

## 2012-05-07 MED ORDER — BOOST / RESOURCE BREEZE PO LIQD
1.0000 | Freq: Three times a day (TID) | ORAL | Status: DC
  Administered 2012-05-07: 1 via ORAL

## 2012-05-07 MED ORDER — VITAMINS A & D EX OINT
TOPICAL_OINTMENT | CUTANEOUS | Status: AC
  Administered 2012-05-07: 15:00:00
  Filled 2012-05-07: qty 5

## 2012-05-07 MED ORDER — SODIUM CHLORIDE 0.9 % IV SOLN
INTRAVENOUS | Status: DC
  Administered 2012-05-07 – 2012-05-08 (×2): via INTRAVENOUS

## 2012-05-07 NOTE — Progress Notes (Signed)
[facility] PROGRESS NOTE  Alyssa Fernandez:[id number] DOB: 1941/03/27 DOA: 05/06/2012 PCP: Oliver Barre, MD  Assessment/Plan: Principal Problem:  *Hepatic abscess Active Problems:  HYPOTHYROIDISM  VITAMIN D DEFICIENCY  HYPERLIPIDEMIA  ESSENTIAL HYPERTENSION  GERD  POSTMENOPAUSAL SYNDROME  POLYCYSTIC KIDNEY DISEASE  Hypokalemia  Fever  Dehydration  Normocytic anemia  #1 acute hepatic abscess Status post percutaneous drain. Cultures are pending. WBC is trending down. Patient is currently afebrile. Continue empiric IV Zosyn. Was drained H. decrease his 2 probably less than 10 cc per day will likely require repeat CT of the abdomen and pelvis prior to drain removal. ID has been consulted and appreciate input and recommendations. Interventional radiology is following.  #2 fever Likely secondary to problem #1. Blood cultures are pending. UA is negative. Chest x-ray is negative. Continue empiric IV Zosyn. Follow.  #3 gastroesophageal reflux disease PPI.  #4 hypokalemia Repleted.  #5 dehydration Gentle IV hydration.  #6 hypertension Stable. Continue Lopressor and lisinopril.  #7 depression/anxiety Continue Celexa and Xanax  #8 hyperlipidemia Continue statin  #9 polycystic kidney disease Stable  #10 prophylaxis PPI for GI prophylaxis SCDs for DVT prophylaxis.  Code Status: Full Family Communication: Updated patient at bedside Disposition Plan: Home when medically stable   Brief narrative: Alyssa Fernandez is a 71 y.o. female with past medical history of hypertension, polycystic kidney disease, anxiety, hypothyroidism, gastroesophageal reflux disease who had presented to interventional radiology on the day of admission for drain placement secondary to a hepatic abscess. Patient states that developed a fever of 102, right upper quadrant pain, right-sided bloating 7-10 days prior to admission which was progressively getting worse and a such subsequently presented to  her PCPs office to be assessed. Patient states she was seen in the PCPs office were CT of the abdomen and pelvis were done which was concerning for hepatic abscess. Patient stated that her PCPs office called her at home 3 days prior to admission asking her to present to the ED for admission and further evaluation however patient stated that she refused as she was starting to feel better denied any emesis as stated her abdominal pain was improving. Patient was subsequently called and sent to interventional radiology for hepatic drain placement. Patient states prior to admission was being treated empirically at home with oral Cipro and Flagyl. Patient denies any shortness of breath, denies any headache, no chest pain, no nausea, no dysuria, patient does endorse a nonproductive cough, an episode of diarrhea, an episode of emesis after drinking oral contrast. Postprocedure patient was noted to spike a fever 102, have nausea and vomiting. Will call to admit the patient for further evaluation and management.   Consultants:  Interventional radiology Dr. Grace Isaac 05/06/2012  Infectious disease Dr. Orvan Falconer 05/07/2012  Procedures:  Placement of 10 French drain into hepatic abscess within the posterior segment of the right lobe of the liver per interventional radiology 05/06/2012  Antibiotics:  IV Zosyn 05/06/2012  HPI/Subjective: Patient complaining of some right upper quadrant abdominal pain.  Objective: Filed Vitals:   05/07/12 0553 05/07/12 1112 05/07/12 1113 05/07/12 1421  BP: 117/60 143/73  102/64  Pulse: 75  71 61  Temp: 98.4 F (36.9 C)   98.5 F (36.9 C)  TempSrc: Oral   Oral  Resp: 18   20  Height:      Weight: 60.6 kg (133 lb 9.6 oz)     SpO2: 97%   97%    Intake/Output Summary (Last 24 hours) at 05/07/12 1633 Last data filed  at 05/07/12 1558  Gross per 24 hour  Intake 1907.33 ml  Output    652 ml  Net 1255.33 ml   Filed Weights   05/06/12 1644 05/07/12 0553  Weight: 59.5 kg  (131 lb 2.8 oz) 60.6 kg (133 lb 9.6 oz)    Exam:   General:  NAD  Cardiovascular: RRR  Respiratory: CTAB  Abdomen: SOFT/ Decreasing TTP in RUQ, ND, +BS. Right-sided percutaneous drain.  Data Reviewed: Basic Metabolic Panel:  Lab 05/07/12 4098 05/06/12 1923 05/06/12 1810 05/06/12 1134 05/01/12 1453  NA 133* -- 133* 132* 135  K 4.1 -- 2.9* 2.3* 3.3*  CL 100 -- 93* 90* 95*  CO2 27 -- 30 31 30   GLUCOSE 103* -- 115* 99 94  BUN 9 -- 11 11 13   CREATININE 0.95 -- 0.97 0.94 0.9  CALCIUM 8.3* -- 8.3* 8.5 8.3*  MG 2.1 -- 1.8 -- --  PHOS -- 2.7 -- -- --   Liver Function Tests:  Lab 05/07/12 0353 05/06/12 1810 05/01/12 1453  AST 12 14 14   ALT 5 <5 8  ALKPHOS 61 65 68  BILITOT 0.4 0.4 1.1  PROT 5.6* 6.1 6.0  ALBUMIN 2.3* 2.5* 2.6*   No results found for this basename: LIPASE:5,AMYLASE:5 in the last 168 hours No results found for this basename: AMMONIA:5 in the last 168 hours CBC:  Lab 05/07/12 0353 05/06/12 1810 05/06/12 1107 05/01/12 1453  WBC 13.7* 20.3* 10.8* 11.0*  NEUTROABS 12.0* 19.0* -- 9.1*  HGB 9.8* 10.5* 11.1* 10.5*  HCT 30.5* 31.6* 32.9* 31.7*  MCV 90.8 88.8 88.0 90.6  PLT 510* 585* 627* 360.0   Cardiac Enzymes: No results found for this basename: CKTOTAL:5,CKMB:5,CKMBINDEX:5,TROPONINI:5 in the last 168 hours BNP (last 3 results) No results found for this basename: PROBNP:3 in the last 8760 hours CBG: No results found for this basename: GLUCAP:5 in the last 168 hours  Recent Results (from the past 240 hour(s))  CULTURE, ROUTINE-ABSCESS     Status: Normal (Preliminary result)   Collection Time   05/06/12  2:03 PM      Component Value Range Status Comment   Specimen Description DRAINAGE HEPATIC CYST   Final    Special Requests Normal   Final    Gram Stain     Final    Value: ABUNDANT WBC PRESENT,BOTH PMN AND MONONUCLEAR     NO SQUAMOUS EPITHELIAL CELLS SEEN     NO ORGANISMS SEEN   Culture NO GROWTH   Final    Report Status PENDING   Incomplete     ANAEROBIC CULTURE     Status: Normal (Preliminary result)   Collection Time   05/06/12  2:03 PM      Component Value Range Status Comment   Specimen Description DRAINAGE HEPATIC CYST   Final    Special Requests Normal   Final    Gram Stain     Final    Value: ABUNDANT WBC PRESENT,BOTH PMN AND MONONUCLEAR     NO SQUAMOUS EPITHELIAL CELLS SEEN     NO ORGANISMS SEEN   Culture     Final    Value: NO ANAEROBES ISOLATED; CULTURE IN PROGRESS FOR 5 DAYS   Report Status PENDING   Incomplete   CULTURE, BLOOD (ROUTINE X 2)     Status: Normal (Preliminary result)   Collection Time   05/06/12  6:10 PM      Component Value Range Status Comment   Specimen Description BLOOD RIGHT HAND   Final  Special Requests BOTTLES DRAWN AEROBIC AND ANAEROBIC 10CC   Final    Culture  Setup Time 05/06/2012 23:01   Final    Culture     Final    Value:        BLOOD CULTURE RECEIVED NO GROWTH TO DATE CULTURE WILL BE HELD FOR 5 DAYS BEFORE ISSUING A FINAL NEGATIVE REPORT   Report Status PENDING   Incomplete   CULTURE, BLOOD (ROUTINE X 2)     Status: Normal (Preliminary result)   Collection Time   05/06/12  6:15 PM      Component Value Range Status Comment   Specimen Description BLOOD RIGHT ARM   Final    Special Requests BOTTLES DRAWN AEROBIC AND ANAEROBIC 10CC   Final    Culture  Setup Time 05/06/2012 23:02   Final    Culture     Final    Value:        BLOOD CULTURE RECEIVED NO GROWTH TO DATE CULTURE WILL BE HELD FOR 5 DAYS BEFORE ISSUING A FINAL NEGATIVE REPORT   Report Status PENDING   Incomplete      Studies: Ct Guided Abscess Drain  05/06/2012  *RADIOLOGY REPORT*  Indication: Infected hepatic cyst, now with hepatic abscess.  CT GUIDED HEPATIC ABSCESS DRAINAGE CATHETER PLACEMENT  Comparison: CT abdomen pelvis - 05/03/2012  Medications: Fentanyl 100 mcg IV; Versed 2 mg IV; Ciprofloxacin 400 mg IV  Total Moderate Sedation time: 42 minutes (procedure artificially lengthened secondary to the CT malfunctioning  requiring rebooting).  Contrast: None  Complications: The patient developed post drain placement rigors and nausea which subsided with the administration of fluid bolus and the IV Zofran.  Technique / Findings:  Informed written consent was obtained from the patient after a discussion of the risks, benefits and alternatives to treatment. The patient was placed supine on the CT gantry and a pre procedural CT was performed re-demonstrating the known abscess/fluid collection within the medial aspect of the posterior segment of the right lobe the liver.  The procedure was planned.   A timeout was performed prior to the initiation of the procedure.  The lateral aspect of the right upper abdomen was prepped and draped in the usual sterile fashion.   The overlying soft tissues were anesthetized with 1% lidocaine with epinephrine.  A 5 [place] Yueh sheath need was advanced in to the abscess/fluid collection resulting in aspiration of a small amount of purulent fluid.  As such, a short Amplatz super stiff wire was coiled within the abscess/fluid collection.   Appropriate positioning was confirmed with a limited CT scan.  The tract was serially dilated allowing placement of a 10 [place] all-purpose drainage catheter. Appropriate positioning was confirmed with a limited postprocedural CT scan.  90 ml of purulent fluid was aspirated.  The tube was connected to a drainage bag and sutured in place.  A dressing was placed.  As stated above, despite preprocedural antibiotics, the patient developed post procedural rigors and nausea which resolved with fluid bolus and IV Zofran.  Impression:  Successful CT guided placement of a 10 [place] all purpose drain catheter into the abscess within the medial aspect the posterior segment right lobe of the liver with aspiration of 90 mL of purulent fluid.  Samples were sent to the laboratory as requested by the ordering clinical team.  Plan:  Given the development of postprocedural rigors, the  patient will be admitted to the hospital for continued monitoring and the administration of intravenous antibiotics.  Original Report Authenticated By: Waynard Reeds, M.D.    Dg Chest Port 1 View  05/06/2012  *RADIOLOGY REPORT*  Clinical Data: Fever.  Rule out pneumonia  PORTABLE CHEST - 1 VIEW  Comparison: 05/01/2012  Findings: Heart size is upper normal.  Negative for heart failure. Lungs are clear without infiltrate or effusion.  Negative for pneumonia. Pigtail catheter in the liver.  IMPRESSION: No acute abnormality.   Original Report Authenticated By: Camelia Phenes, M.D.     Scheduled Meds:    . amLODipine  5 mg Oral Daily  . atorvastatin  40 mg Oral q1800  . calcium carbonate  1 tablet Oral Daily  . citalopram  20 mg Oral Daily  . conjugated estrogens  1 Applicatorful Vaginal QHS  . estrogens (conjugated)  0.625 mg Oral Daily  . feeding supplement  1 Container Oral TID BM  . fentaNYL      . lisinopril  20 mg Oral Daily  . metoprolol  100 mg Oral BID  . midazolam      . ondansetron (ZOFRAN) IV  4 mg Intravenous Q6H  . pantoprazole  80 mg Oral Q1200  . piperacillin-tazobactam (ZOSYN)  IV  3.375 g Intravenous Q8H  . potassium chloride  10 mEq Intravenous Q1 Hr x 6  . potassium chloride  40 mEq Oral Q4H  . sodium chloride  3 mL Intravenous Q12H  . vitamin A & D      . vitamin C  250 mg Oral Daily  . DISCONTD: ciprofloxacin  500 mg Oral BID  . DISCONTD: furosemide  40 mg Oral Daily  . DISCONTD: metroNIDAZOLE  500 mg Oral TID  . DISCONTD: piperacillin-tazobactam (ZOSYN)  IV  3.375 g Intravenous Q12H  . DISCONTD: potassium chloride  20 mEq Oral BID  . DISCONTD: potassium chloride  20 mEq Oral BID  . DISCONTD: sertraline  100 mg Oral Daily  . DISCONTD: vitamin C  100 mg Oral Daily   Continuous Infusions:    . sodium chloride 100 mL/hr at 05/07/12 1210  . DISCONTD: sodium chloride 10 mL/hr (05/06/12 1756)  . DISCONTD: 0.9 % NaCl with KCl 40 mEq / L 100 mL/hr at 05/07/12  0434    Principal Problem:  *Hepatic abscess Active Problems:  HYPOTHYROIDISM  VITAMIN D DEFICIENCY  HYPERLIPIDEMIA  ESSENTIAL HYPERTENSION  GERD  POSTMENOPAUSAL SYNDROME  POLYCYSTIC KIDNEY DISEASE  Hypokalemia  Fever  Dehydration  Normocytic anemia    Time spent: 40 mins    [facility]  [facility] Pager [phone]. If 8PM-8AM, please contact night-coverage at [email or web address], password [facility] 05/07/2012, 4:33 PM  LOS: 1 day

## 2012-05-07 NOTE — Progress Notes (Signed)
   CARE MANAGEMENT NOTE 05/07/2012  Patient:  Alyssa Fernandez, Alyssa Fernandez   Account Number:  [id number]  Date Initiated:  05/07/2012  Documentation initiated by:  Jiles Crocker  Subjective/Objective Assessment:   ADMITTED WITH hepatic abscess status post perc drain 05/06/2012/ FEVER     Action/Plan:   PCP: Oliver Barre, MD  LIVES AT HOME WITH SPOUSE   Anticipated DC Date:  05/14/2012   Anticipated DC Plan:  HOME/SELF CARE      DC Planning Services  CM consult               Status of service:  In process, will continue to follow Medicare Important Message given?  NA - LOS <3 / Initial given by admissions (If response is "NO", the following Medicare IM given date fields will be blank)  Per UR Regulation:  Reviewed for med. necessity/level of care/duration of stay  Comments:  05/07/2012- B Eular Panek RN, BSN, MHA

## 2012-05-07 NOTE — Consult Note (Addendum)
[facility]  Total days of antibiotics 6        Day 2 piperacillin and tazobactam               Reason for Consult: liver abscess     Referring Physician:  Dr. Ramiro Harvest  Principal Problem:  *Hepatic abscess Active Problems:  Fever  HYPOTHYROIDISM  VITAMIN D DEFICIENCY  HYPERLIPIDEMIA  ESSENTIAL HYPERTENSION  GERD  POSTMENOPAUSAL SYNDROME  POLYCYSTIC KIDNEY DISEASE  Hypokalemia  Dehydration  Normocytic anemia  "walking corpse" syndrome      . amLODipine  5 mg Oral Daily  . atorvastatin  40 mg Oral q1800  . calcium carbonate  1 tablet Oral Daily  . citalopram  20 mg Oral Daily  . conjugated estrogens  1 Applicatorful Vaginal QHS  . estrogens (conjugated)  0.625 mg Oral Daily  . fentaNYL      . lisinopril  20 mg Oral Daily  . metoprolol  100 mg Oral BID  . midazolam      . ondansetron (ZOFRAN) IV  4 mg Intravenous Q6H  . pantoprazole  80 mg Oral Q1200  . piperacillin-tazobactam (ZOSYN)  IV  3.375 g Intravenous Q8H  . potassium chloride  10 mEq Intravenous Q1 Hr x 6  . potassium chloride  40 mEq Oral Q4H  . sodium chloride  500 mL Intravenous Once  . sodium chloride  3 mL Intravenous Q12H  . vitamin A & D      . vitamin C  250 mg Oral Daily  . DISCONTD: ciprofloxacin  500 mg Oral BID  . DISCONTD: furosemide  40 mg Oral Daily  . DISCONTD: metroNIDAZOLE  500 mg Oral TID  . DISCONTD: piperacillin-tazobactam (ZOSYN)  IV  3.375 g Intravenous Q12H  . DISCONTD: potassium chloride  20 mEq Oral BID  . DISCONTD: potassium chloride  20 mEq Oral BID  . DISCONTD: sertraline  100 mg Oral Daily  . DISCONTD: vitamin C  100 mg Oral Daily    Recommendations: 1. Continue piperacillin tazobactam pending cultures   2. Monitor drain output  Assessment: She developed an abscess superimposed upon one of her pre-existing liver cysts. 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  Piperacillin tazobactam is good empiric therapy pending cultures. However, cultures may be be falsely negative due to prior therapy with ciprofloxacin and metronidazole. Once we have preliminary culture data I will determine an antibiotic regimen for her to go home on. We'll need to monitor drain output to determine when she can undergo repeat CT scanning and then, drain removal.   HPI: Alyssa Fernandez is a 71 y.o. female  with polycystic disease who has been in good health until about 2 and half weeks ago when she began to notice extreme malaise, anorexia and abdominal bloating. She began to have some right upper quadrant pain and fever and saw Dr. Gery Pray on September 11. She was started on empiric ciprofloxacin and metronidazole and a CAT scan was ordered which showed enlargement of one of her hepatic cyst worrisome for infection. She was admitted yesterday after having a percutaneous drain placed. Right after the procedure she had a hard shaking chill and spiked a fever to over 102. She is feeling much better today. About 90 cc of pus were aspirated and the drain was left in. Her antibiotic therapy was changed to piperacillin tazobactam pending cultures. Gram stain of  abscess fluid did not reveal any organisms.   Review of Systems: Pertinent items are noted in HPI.  Past Medical History  Diagnosis Date  . Hyperlipidemia   . Hypertension   . Polycystic kidney disease   . Anxiety   . RENAL FAILURE   . 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 HYPOTHYROIDISM   . GERD   . VITAMIN D DEFICIENCY   . OSTEOPENIA   . INSOMNIA   . 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 Smoking status: Never Smoker   . Smokeless tobacco: Never Used  . Alcohol Use: No    Family History  Problem Relation Age of Onset  . Stroke Mother    Allergies  Allergen Reactions  . Codeine   . Sulfonamide Derivatives     OBJECTIVE: Blood  pressure 102/64, pulse 61, temperature 98.5 F (36.9 C), temperature source Oral, resp. rate 20, height 5\' 3"  (1.6 m), weight 60.6 kg (133 lb 9.6 oz), SpO2 97.00%. General:  she is alert, comfortable and in good spirits Skin:  no rash Lungs:  clear Cor:  regular S1 and S2 with no murmurs Abdomen:  soft with mild right upper quadrant tenderness and normal bowel sounds percutaneous drain: Serous fluid streaked with blood in drain  Microbiology: Recent Results (from the past 240 hour(s))  CULTURE, ROUTINE-ABSCESS     Status: Normal (Preliminary result)   Collection Time   05/06/12  2:03 PM      Component Value Range Status Comment   Specimen Description DRAINAGE HEPATIC CYST   Final    Special Requests Normal   Final    Gram Stain     Final    Value: ABUNDANT WBC PRESENT,BOTH PMN AND MONONUCLEAR     NO SQUAMOUS EPITHELIAL CELLS SEEN     NO ORGANISMS SEEN   Culture NO GROWTH   Final    Report Status PENDING   Incomplete   ANAEROBIC CULTURE     Status: Normal (Preliminary result)   Collection Time   05/06/12  2:03 PM      Component Value Range Status Comment   Specimen Description DRAINAGE HEPATIC CYST   Final    Special Requests Normal   Final    Gram Stain     Final    Value: ABUNDANT WBC PRESENT,BOTH PMN AND MONONUCLEAR     NO SQUAMOUS EPITHELIAL CELLS SEEN     NO ORGANISMS SEEN   Culture     Final    Value: NO ANAEROBES ISOLATED; CULTURE IN PROGRESS FOR 5 DAYS   Report Status PENDING   Incomplete   CULTURE, BLOOD (ROUTINE X 2)     Status: Normal (Preliminary result)   Collection Time   05/06/12  6:10 PM      Component Value Range Status Comment   Specimen Description BLOOD RIGHT HAND   Final    Special Requests BOTTLES DRAWN AEROBIC AND ANAEROBIC 10CC   Final    Culture  Setup Time 05/06/2012 23:01   Final    Culture     Final    Value:        BLOOD CULTURE RECEIVED NO GROWTH TO DATE CULTURE WILL BE HELD FOR 5 DAYS BEFORE ISSUING A FINAL NEGATIVE REPORT   Report Status  PENDING   Incomplete   CULTURE, BLOOD (ROUTINE X 2)     Status: Normal (Preliminary result)   Collection Time  05/06/12  6:15 PM      Component Value Range Status Comment   Specimen Description BLOOD RIGHT ARM   Final    Special Requests BOTTLES DRAWN AEROBIC AND ANAEROBIC 10CC   Final    Culture  Setup Time 05/06/2012 23:02   Final    Culture     Final    Value:        BLOOD CULTURE RECEIVED NO GROWTH TO DATE CULTURE WILL BE HELD FOR 5 DAYS BEFORE ISSUING A FINAL NEGATIVE REPORT   Report Status PENDING   Incomplete     Cliffton Asters, MD [facility] [phone] pager   [phone] cell 05/07/2012, 2:54 PM

## 2012-05-07 NOTE — Progress Notes (Signed)
Subjective: Pt feeling better today;has some RUQ discomfort as expected following drainage procedure  Objective: Vital signs in last 24 hours: Temp:  [98.4 F (36.9 C)-102.6 F (39.2 C)] 98.4 F (36.9 C) (09/17 0553) Pulse Rate:  [63-98] 75  (09/17 0553) Resp:  [8-18] 18  (09/17 0553) BP: (93-129)/(45-67) 117/60 mmHg (09/17 0553) SpO2:  [93 %-100 %] 97 % (09/17 0553) Weight:  [131 lb 2.8 oz (59.5 kg)-133 lb 9.6 oz (60.6 kg)] 133 lb 9.6 oz (60.6 kg) (09/17 0553) Last BM Date: 05/05/12  Intake/Output from previous day: 09/16 0701 - 09/17 0700 In: 1662.3 [I.V.:1148.3; IV Piggyback:504] Out: 90  Intake/Output this shift:    Hepatic drain intact, dressing dry, site mod tender to palpation, output 90 cc's blood tinged fluid; urine/blood/hepatic drain cx's pending  Lab Results:   Basename 05/07/12 0353 05/06/12 1810  WBC 13.7* 20.3*  HGB 9.8* 10.5*  HCT 30.5* 31.6*  PLT 510* 585*   BMET  Basename 05/07/12 0353 05/06/12 1810  NA 133* 133*  K 4.1 2.9*  CL 100 93*  CO2 27 30  GLUCOSE 103* 115*  BUN 9 11  CREATININE 0.95 0.97  CALCIUM 8.3* 8.3*   PT/INR  Basename 05/06/12 1107  LABPROT 15.6*  INR 1.21   ABG No results found for this basename: PHART:2,PCO2:2,PO2:2,HCO3:2 in the last 72 hours  Studies/Results: Ct Guided Abscess Drain  05/06/2012  *RADIOLOGY REPORT*  Indication: Infected hepatic cyst, now with hepatic abscess.  CT GUIDED HEPATIC ABSCESS DRAINAGE CATHETER PLACEMENT  Comparison: CT abdomen pelvis - 05/03/2012  Medications: Fentanyl 100 mcg IV; Versed 2 mg IV; Ciprofloxacin 400 mg IV  Total Moderate Sedation time: 42 minutes (procedure artificially lengthened secondary to the CT malfunctioning requiring rebooting).  Contrast: None  Complications: The patient developed post drain placement rigors and nausea which subsided with the administration of fluid bolus and the IV Zofran.  Technique / Findings:  Informed written consent was obtained from the patient  after a discussion of the risks, benefits and alternatives to treatment. The patient was placed supine on the CT gantry and a pre procedural CT was performed re-demonstrating the known abscess/fluid collection within the medial aspect of the posterior segment of the right lobe the liver.  The procedure was planned.   A timeout was performed prior to the initiation of the procedure.  The lateral aspect of the right upper abdomen was prepped and draped in the usual sterile fashion.   The overlying soft tissues were anesthetized with 1% lidocaine with epinephrine.  A 5 [place] Yueh sheath need was advanced in to the abscess/fluid collection resulting in aspiration of a small amount of purulent fluid.  As such, a short Amplatz super stiff wire was coiled within the abscess/fluid collection.   Appropriate positioning was confirmed with a limited CT scan.  The tract was serially dilated allowing placement of a 10 [place] all-purpose drainage catheter. Appropriate positioning was confirmed with a limited postprocedural CT scan.  90 ml of purulent fluid was aspirated.  The tube was connected to a drainage bag and sutured in place.  A dressing was placed.  As stated above, despite preprocedural antibiotics, the patient developed post procedural rigors and nausea which resolved with fluid bolus and IV Zofran.  Impression:  Successful CT guided placement of a 10 [place] all purpose drain catheter into the abscess within the medial aspect the posterior segment right lobe of the liver with aspiration of 90 mL of purulent fluid.  Samples were sent to the laboratory  as requested by the ordering clinical team.  Plan:  Given the development of postprocedural rigors, the patient will be admitted to the hospital for continued monitoring and the administration of intravenous antibiotics.   Original Report Authenticated By: Waynard Reeds, M.D.    Dg Chest Port 1 View  05/06/2012  *RADIOLOGY REPORT*  Clinical Data: Fever.  Rule out  pneumonia  PORTABLE CHEST - 1 VIEW  Comparison: 05/01/2012  Findings: Heart size is upper normal.  Negative for heart failure. Lungs are clear without infiltrate or effusion.  Negative for pneumonia. Pigtail catheter in the liver.  IMPRESSION: No acute abnormality.   Original Report Authenticated By: Camelia Phenes, M.D.     Anti-infectives: Anti-infectives     Start     Dose/Rate Route Frequency Ordered Stop   05/06/12 2200   ciprofloxacin (CIPRO) tablet 500 mg  Status:  Discontinued        500 mg Oral 2 times daily 05/06/12 1653 05/06/12 1813   05/06/12 2200   metroNIDAZOLE (FLAGYL) tablet 500 mg  Status:  Discontinued        500 mg Oral 3 times daily 05/06/12 1653 05/06/12 1813   05/06/12 2200   piperacillin-tazobactam (ZOSYN) IVPB 3.375 g  Status:  Discontinued        3.375 g 12.5 mL/hr over 240 Minutes Intravenous Every 12 hours 05/06/12 1654 05/06/12 1813   05/06/12 1830   piperacillin-tazobactam (ZOSYN) IVPB 3.375 g        3.375 g 12.5 mL/hr over 240 Minutes Intravenous Every 8 hours 05/06/12 1815     05/06/12 1315   ciprofloxacin (CIPRO) IVPB 400 mg     Comments: Pt in ct radiology      400 mg 200 mL/hr over 60 Minutes Intravenous  Once 05/06/12 1309 05/06/12 1419          Assessment/Plan: S/p right hepatic abscess drainage 9/16;  IV antibiotics per primary/ID; check final cx's/sens, monitor labs, cont drain flushes; check f/u CT once output declines  LOS: 1 day    Connelly [place],D [facility] 05/07/2012

## 2012-05-07 NOTE — Progress Notes (Signed)
INITIAL ADULT NUTRITION ASSESSMENT Date: 05/07/2012   Time: 3:45 PM Reason for Assessment: Nutrition risk- weight loss and decreased intake  ASSESSMENT: Female 71 y.o.  Dx: Hepatic abscess  Hx:  Past Medical History  Diagnosis Date  . Hyperlipidemia   . Hypertension   . Polycystic kidney disease   . Anxiety   . RENAL FAILURE   . 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 HYPOTHYROIDISM   . GERD   . VITAMIN D DEFICIENCY   . OSTEOPENIA   . INSOMNIA   . Cervical spine degeneration     Severe by CT April 2012   Past Surgical History  Procedure Date  . Partial nephrectomy   . Abdominal hysterectomy   . Cervical disc surgery   . Bunionectomy   . Fx rt ankle   . Left temporal bx artery-negative 2    . Bladder operation, but per sling procedure   . 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 Percutaneous drainage of liver abscess 05/06/2012    Related Meds:  Scheduled Meds:   . amLODipine  5 mg Oral Daily  . atorvastatin  40 mg Oral q1800  . calcium carbonate  1 tablet Oral Daily  . citalopram  20 mg Oral Daily  . conjugated estrogens  1 Applicatorful Vaginal QHS  . estrogens (conjugated)  0.625 mg Oral Daily  . fentaNYL      . lisinopril  20 mg Oral Daily  . metoprolol  100 mg Oral BID  . midazolam      . ondansetron (ZOFRAN) IV  4 mg Intravenous Q6H  . pantoprazole  80 mg Oral Q1200  . piperacillin-tazobactam (ZOSYN)  IV  3.375 g Intravenous Q8H  . potassium chloride  10 mEq Intravenous Q1 Hr x 6  . potassium chloride  40 mEq Oral Q4H  . sodium chloride  3 mL Intravenous Q12H  . vitamin A & D      . vitamin C  250 mg Oral Daily  . DISCONTD: ciprofloxacin  500 mg Oral BID  . DISCONTD: furosemide  40 mg Oral Daily  . DISCONTD: metroNIDAZOLE  500 mg Oral TID  . DISCONTD: piperacillin-tazobactam (ZOSYN)  IV  3.375 g Intravenous Q12H  . DISCONTD: potassium chloride  20 mEq Oral BID  . DISCONTD: potassium chloride  20 mEq Oral BID  . DISCONTD: sertraline  100 mg Oral  Daily  . DISCONTD: vitamin C  100 mg Oral Daily   Continuous Infusions:   . sodium chloride 100 mL/hr at 05/07/12 1210  . DISCONTD: sodium chloride 10 mL/hr (05/06/12 1756)  . DISCONTD: 0.9 % NaCl with KCl 40 mEq / L 100 mL/hr at 05/07/12 0434   PRN Meds:.acetaminophen, ALPRAZolam, alum & mag hydroxide-simeth, bisacodyl, morphine injection, ondansetron (ZOFRAN) IV, ondansetron, oxyCODONE, senna-docusate, sodium phosphate, zolpidem, DISCONTD: HYDROcodone-acetaminophen   Ht: 5\' 3"  (160 cm)  Wt: 133 lb 9.6 oz (60.6 kg)  Ideal Wt: 52.4 kg  % Ideal Wt: 116  Usual Wt: 140-145# per pt Wt Readings from Last 10 Encounters:  05/07/12 133 lb 9.6 oz (60.6 kg)  05/01/12 133 lb (60.328 kg)  02/16/12 142 lb 4 oz (64.524 kg)  11/20/11 139 lb 4 oz (63.163 kg)  06/05/11 139 lb (63.05 kg)  06/02/11 139 lb (63.05 kg)  03/29/11 138 lb (62.596 kg)  12/28/10 135 lb (61.236 kg)  10/25/10 136 lb (61.689 kg)  02/24/10 129 lb (58.514 kg)    % Usual Wt: 94- 6% loss in the last  2 months.  Body mass index is 23.67 kg/(m^2).    Labs:  CMP     Component Value Date/Time   NA 133* 05/07/2012 0353   K 4.1 05/07/2012 0353   CL 100 05/07/2012 0353   CO2 27 05/07/2012 0353   GLUCOSE 103* 05/07/2012 0353   BUN 9 05/07/2012 0353   CREATININE 0.95 05/07/2012 0353   CALCIUM 8.3* 05/07/2012 0353   PROT 5.6* 05/07/2012 0353   ALBUMIN 2.3* 05/07/2012 0353   AST 12 05/07/2012 0353   ALT 5 05/07/2012 0353   ALKPHOS 61 05/07/2012 0353   BILITOT 0.4 05/07/2012 0353   GFRNONAA 58* 05/07/2012 0353   GFRAA 67* 05/07/2012 0353    I/O last 3 completed shifts: In: 1662.3 [I.V.:1148.3; Other:10; IV Piggyback:504] Out: 90 [Other:90] Total I/O In: 245 [P.O.:240; Other:5] Out: 401 [Urine:300; Drains:100; Stool:1]   Diet Order: Clear Liquid  Supplements/Tube Feeding:  none  IVF:    sodium chloride Last Rate: 100 mL/hr at 05/07/12 1210  DISCONTD: sodium chloride Last Rate: 10 mL/hr (05/06/12 1756)  DISCONTD: 0.9 %  NaCl with KCl 40 mEq / L Last Rate: 100 mL/hr at 05/07/12 0434    Estimated Nutritional Needs:   Kcal: 1500-1600 Protein: 60-70 gm Fluid: >1.6L  Food/Nutrition Related Hx: Pt reports a very poor appetite for months with a resulting poor intake.  Regular diet prior to admit.  Drinks Gatorade but no high protein supplements prior to admit.Pt has lost 6% in the past 2 months.  Tolerating clear liquid diet currently.  Some of the variability in pts weight due to fluid.  NUTRITION DIAGNOSIS: -Inadequate oral intake (NI-2.1).  Status: Ongoing  RELATED TO: clear liquid diet and poor appetite  AS EVIDENCE BY: clear liquid diet and reported intake prior to admit on regular diet.  MONITORING/EVALUATION(Goals): Diet progression per MD as medically appropriate with intake of >75% meals and supplements. Monitor:  Intake, labs, diet advancement, weight   EDUCATION NEEDS: -No education needs identified at this time  INTERVENTION: Resource Breeze tid  Dietitian #:Riaan Toledo Derrell Lolling, RD, LDN Clinical Inpatient Dietitian Pager:  [phone] Weekend and after hours pager:  [phone]    DOCUMENTATION CODES Per approved criteria  -Not Applicable    Jeoffrey Massed 05/07/2012, 3:45 PM

## 2012-05-08 DIAGNOSIS — R1013 Epigastric pain: Secondary | ICD-10-CM

## 2012-05-08 LAB — CBC WITH DIFFERENTIAL/PLATELET
Basophils Absolute: 0 10*3/uL (ref 0.0–0.1)
Eosinophils Relative: 2 % (ref 0–5)
HCT: 31.9 % — ABNORMAL LOW (ref 36.0–46.0)
Hemoglobin: 10.4 g/dL — ABNORMAL LOW (ref 12.0–15.0)
Lymphocytes Relative: 14 % (ref 12–46)
MCHC: 32.6 g/dL (ref 30.0–36.0)
MCV: 90.9 fL (ref 78.0–100.0)
Monocytes Absolute: 0.6 10*3/uL (ref 0.1–1.0)
Monocytes Relative: 6 % (ref 3–12)
RDW: 13.7 % (ref 11.5–15.5)
WBC: 10.3 10*3/uL (ref 4.0–10.5)

## 2012-05-08 LAB — BASIC METABOLIC PANEL
BUN: 6 mg/dL (ref 6–23)
CO2: 24 mEq/L (ref 19–32)
Calcium: 8.1 mg/dL — ABNORMAL LOW (ref 8.4–10.5)
Creatinine, Ser: 0.91 mg/dL (ref 0.50–1.10)

## 2012-05-08 MED ORDER — LEVOFLOXACIN 750 MG PO TABS
750.0000 mg | ORAL_TABLET | Freq: Every day | ORAL | Status: DC
  Administered 2012-05-08 – 2012-05-10 (×3): 750 mg via ORAL
  Filled 2012-05-08 (×3): qty 1

## 2012-05-08 MED ORDER — METRONIDAZOLE 500 MG PO TABS
500.0000 mg | ORAL_TABLET | Freq: Three times a day (TID) | ORAL | Status: DC
  Administered 2012-05-08 – 2012-05-10 (×8): 500 mg via ORAL
  Filled 2012-05-08 (×10): qty 1

## 2012-05-08 NOTE — Progress Notes (Signed)
[facility] PROGRESS NOTE  Alyssa Fernandez ZOX:[id number] DOB: April 13, 1941 DOA: 05/06/2012 PCP: Oliver Barre, MD  Assessment/Plan: Principal Problem:  *Hepatic abscess Active Problems:  HYPOTHYROIDISM  VITAMIN D DEFICIENCY  HYPERLIPIDEMIA  ESSENTIAL HYPERTENSION  GERD  POSTMENOPAUSAL SYNDROME  POLYCYSTIC KIDNEY DISEASE  Hypokalemia  Fever  Dehydration  Normocytic anemia  #1 acute hepatic abscess Status post percutaneous drain. Anaerobic Cultures are pending-facial polymorphonuclear cells. WBC is trending down. Patient is currently afebrile. Zosyn changed to by mouth Flagyl and Cipro 9/18 per infectious disease specialist. When drainage  less than 10 cc per day will likely require repeat CT of the abdomen and pelvis prior to drain removal. ID has been consulted and appreciate input and recommendations. Interventional radiology is following-appreciate input  #2 fever Likely secondary to problem #1. Blood cultures are pending. UA is negative. Chest x-ray is negative.  Follow.  #3 gastroesophageal reflux disease PPI.  #4 hypokalemia Repleted-potassium stable  #5 dehydration Gentle IV hydration-discontinue IV fluids 9/18 continue by mouth fluids  #6 hypertension Stable. Continue Lopressor 100 mg twice a day and lisinopril 20 mg daily.  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 female with past medical history of hypertension,  polycystic kidney disease, anxiety, hypothyroidism, gastroesophageal reflux disease who had presented to interventional radiology on the day of admission for drain placement secondary to a hepatic abscess.  Patient states that developed a fever of 102, right upper quadrant pain, right-sided bloating 7-10 days prior to admission which was progressively getting worse and a such subsequently presented to her PCPs office to be assessed.  Patient states she was seen in the PCPs office were CT of the abdomen and pelvis were done which was concerning for hepatic abscess. Patient stated that her PCPs office called her at home 3 days prior to admission asking her to present to the ED for admission and further evaluation however patient stated that she refused as she was starting to feel better denied any emesis as stated her abdominal pain was improving. Patient was subsequently called and sent to interventional radiology for hepatic drain placement. Patient states prior to admission was being treated empirically at home with oral Cipro and Flagyl. Patient denies any shortness of breath, denies any headache, no chest pain, no nausea, no dysuria, patient does endorse a nonproductive cough, an episode of diarrhea, an episode of emesis after drinking oral contrast. Postprocedure patient was noted to spike a fever 102, have nausea and vomiting. Will call to admit the patient for further evaluation and management.   Consultants:  Interventional radiology Dr. Grace Isaac 05/06/2012  Infectious disease Dr. Orvan Falconer 05/07/2012  Procedures:  Placement of 10 French drain into hepatic abscess within the posterior segment of the right lobe of the liver per interventional radiology 05/06/2012  Antibiotics:  IV Zosyn 05/06/2012 >9/18  Cipro and Flagyl 9/18 >>>  HPI/Subjective: Patient complaining of 2/10 right upper quadrant abdominal pain. Patient was able to him they can get up and move around with minimal assistance  today. Telemetry  shows no new findings.  Tolerating liquids well without any nausea vomiting denies any chest pain currently  Objective: Filed Vitals:   05/07/12 1421 05/07/12 2127 05/08/12 0532 05/08/12 1500  BP: 102/64 113/64 128/77 122/65  Pulse: 61 74 84 81  Temp: 98.5 F (36.9 C) 99.1 F (37.3 C) 98.6 F (37 C) 98 F (36.7 C)  TempSrc: Oral Oral Oral Oral  Resp: 20 18 18 18   Height:      Weight:   62.642 kg (138 lb 1.6 oz)   SpO2: 97% 92% 95% 98%    Intake/Output Summary (Last 24 hours) at 05/08/12 1528 Last data filed at 05/08/12 1241  Gross per 24 hour  Intake   2455 ml  Output   2746 ml  Net   -291 ml   Filed Weights   05/06/12 1644 05/07/12 0553 05/08/12 0532  Weight: 59.5 kg (131 lb 2.8 oz) 60.6 kg (133 lb 9.6 oz) 62.642 kg (138 lb 1.6 oz)    Exam:   General:  NAD  Cardiovascular: RRR  Respiratory: CTAB  Abdomen: SOFT/ Decreasing TTP in RUQ, ND, +BS. Right-sided percutaneous drain-minimal drainage noted  Data Reviewed: Basic Metabolic Panel:  Lab 05/08/12 1610 05/07/12 0353 05/06/12 1923 05/06/12 1810 05/06/12 1134  NA 133* 133* -- 133* 132*  K 4.1 4.1 -- 2.9* 2.3*  CL 100 100 -- 93* 90*  CO2 24 27 -- 30 31  GLUCOSE 90 103* -- 115* 99  BUN 6 9 -- 11 11  CREATININE 0.91 0.95 -- 0.97 0.94  CALCIUM 8.1* 8.3* -- 8.3* 8.5  MG -- 2.1 -- 1.8 --  PHOS -- -- 2.7 -- --   Liver Function Tests:  Lab 05/07/12 0353 05/06/12 1810  AST 12 14  ALT 5 <5  ALKPHOS 61 65  BILITOT 0.4 0.4  PROT 5.6* 6.1  ALBUMIN 2.3* 2.5*   No results found for this basename: LIPASE:5,AMYLASE:5 in the last 168 hours No results found for this basename: AMMONIA:5 in the last 168 hours CBC:  Lab 05/08/12 0423 05/07/12 0353 05/06/12 1810 05/06/12 1107  WBC 10.3 13.7* 20.3* 10.8*  NEUTROABS 8.1* 12.0* 19.0* --  HGB 10.4* 9.8* 10.5* 11.1*  HCT 31.9* 30.5* 31.6* 32.9*  MCV 90.9 90.8 88.8 88.0  PLT 605* 510* 585* 627*   Cardiac Enzymes: No results found for this basename:  CKTOTAL:5,CKMB:5,CKMBINDEX:5,TROPONINI:5 in the last 168 hours BNP (last 3 results) No results found for this basename: PROBNP:3 in the last 8760 hours CBG: No results found for this basename: GLUCAP:5 in the last 168 hours  Recent Results (from the past 240 hour(s))  CULTURE, ROUTINE-ABSCESS     Status: Normal (Preliminary result)   Collection Time   05/06/12  2:03 PM      Component Value Range Status Comment   Specimen Description DRAINAGE HEPATIC CYST   Final    Special Requests Normal   Final    Gram Stain     Final    Value: ABUNDANT WBC PRESENT,BOTH PMN AND MONONUCLEAR     NO SQUAMOUS EPITHELIAL CELLS SEEN     NO ORGANISMS SEEN   Culture NO GROWTH 1 DAY   Final    Report Status PENDING   Incomplete   ANAEROBIC CULTURE     Status: Normal (Preliminary result)   Collection Time   05/06/12  2:03 PM      Component Value Range Status Comment   Specimen Description DRAINAGE HEPATIC CYST   Final    Special Requests Normal  Final    Gram Stain     Final    Value: ABUNDANT WBC PRESENT,BOTH PMN AND MONONUCLEAR     NO SQUAMOUS EPITHELIAL CELLS SEEN     NO ORGANISMS SEEN   Culture     Final    Value: NO ANAEROBES ISOLATED; CULTURE IN PROGRESS FOR 5 DAYS   Report Status PENDING   Incomplete   CULTURE, BLOOD (ROUTINE X 2)     Status: Normal (Preliminary result)   Collection Time   05/06/12  6:10 PM      Component Value Range Status Comment   Specimen Description BLOOD RIGHT HAND   Final    Special Requests BOTTLES DRAWN AEROBIC AND ANAEROBIC 10CC   Final    Culture  Setup Time 05/06/2012 23:01   Final    Culture     Final    Value:        BLOOD CULTURE RECEIVED NO GROWTH TO DATE CULTURE WILL BE HELD FOR 5 DAYS BEFORE ISSUING A FINAL NEGATIVE REPORT   Report Status PENDING   Incomplete   CULTURE, BLOOD (ROUTINE X 2)     Status: Normal (Preliminary result)   Collection Time   05/06/12  6:15 PM      Component Value Range Status Comment   Specimen Description BLOOD RIGHT ARM   Final     Special Requests BOTTLES DRAWN AEROBIC AND ANAEROBIC 10CC   Final    Culture  Setup Time 05/06/2012 23:02   Final    Culture     Final    Value:        BLOOD CULTURE RECEIVED NO GROWTH TO DATE CULTURE WILL BE HELD FOR 5 DAYS BEFORE ISSUING A FINAL NEGATIVE REPORT   Report Status PENDING   Incomplete   URINE CULTURE     Status: Normal   Collection Time   05/06/12  9:04 PM      Component Value Range Status Comment   Specimen Description URINE, CLEAN CATCH   Final    Special Requests NONE   Final    Culture  Setup Time 05/07/2012 02:19   Final    Colony Count NO GROWTH   Final    Culture NO GROWTH   Final    Report Status 05/07/2012 FINAL   Final      Studies: Dg Chest Port 1 View  05/06/2012  *RADIOLOGY REPORT*  Clinical Data: Fever.  Rule out pneumonia  PORTABLE CHEST - 1 VIEW  Comparison: 05/01/2012  Findings: Heart size is upper normal.  Negative for heart failure. Lungs are clear without infiltrate or effusion.  Negative for pneumonia. Pigtail catheter in the liver.  IMPRESSION: No acute abnormality.   Original Report Authenticated By: Camelia Phenes, M.D.     Scheduled Meds:    . amLODipine  5 mg Oral Daily  . atorvastatin  40 mg Oral q1800  . calcium carbonate  1 tablet Oral Daily  . citalopram  20 mg Oral Daily  . conjugated estrogens  1 Applicatorful Vaginal QHS  . estrogens (conjugated)  0.625 mg Oral Daily  . feeding supplement  1 Container Oral TID BM  . levofloxacin  750 mg Oral Daily  . lisinopril  20 mg Oral Daily  . metoprolol  100 mg Oral BID  . metroNIDAZOLE  500 mg Oral Q8H  . ondansetron (ZOFRAN) IV  4 mg Intravenous Q6H  . pantoprazole  80 mg Oral Q1200  . sodium chloride  3 mL Intravenous Q12H  .  vitamin C  250 mg Oral Daily  . DISCONTD: piperacillin-tazobactam (ZOSYN)  IV  3.375 g Intravenous Q8H   Continuous Infusions:    . sodium chloride 100 mL/hr at 05/07/12 1210    Principal Problem:  *Hepatic abscess Active Problems:  HYPOTHYROIDISM   VITAMIN D DEFICIENCY  HYPERLIPIDEMIA  ESSENTIAL HYPERTENSION  GERD  POSTMENOPAUSAL SYNDROME  POLYCYSTIC KIDNEY DISEASE  Hypokalemia  Fever  Dehydration  Normocytic anemia    Time spent: 25 mins    Mahala Menghini [facility]  [facility] Pager [phone]. If 8PM-8AM, please contact night-coverage at [email or web address], password [facility] 05/08/2012, 3:28 PM  LOS: 2 days

## 2012-05-08 NOTE — Progress Notes (Signed)
Subjective: Hepatic abscess drain placed 9/16 Feels better today  Objective: Vital signs in last 24 hours: Temp:  [98.5 F (36.9 C)-99.1 F (37.3 C)] 98.6 F (37 C) (09/18 0532) Pulse Rate:  [61-84] 84  (09/18 0532) Resp:  [18-20] 18  (09/18 0532) BP: (102-143)/(64-77) 128/77 mmHg (09/18 0532) SpO2:  [92 %-97 %] 95 % (09/18 0532) Weight:  [138 lb 1.6 oz (62.642 kg)] 138 lb 1.6 oz (62.642 kg) (09/18 0532) Last BM Date: 05/07/12  Intake/Output from previous day: 09/17 0701 - 09/18 0700 In: 3065 [P.O.:600; I.V.:2400; IV Piggyback:50] Out: 2892 [Urine:2740; Drains:150; Stool:2] Intake/Output this shift:    PE:  Afeb; vss Wbc 10.3 Output 150 cc yesterday; 20 cc in bag now Output blood tinged; no growth so far Site clean and dry; sl tender  Lab Results:   Basename 05/08/12 0423 05/07/12 0353  WBC 10.3 13.7*  HGB 10.4* 9.8*  HCT 31.9* 30.5*  PLT 605* 510*   BMET  Basename 05/08/12 0423 05/07/12 0353  NA 133* 133*  K 4.1 4.1  CL 100 100  CO2 24 27  GLUCOSE 90 103*  BUN 6 9  CREATININE 0.91 0.95  CALCIUM 8.1* 8.3*   PT/INR  Basename 05/06/12 1107  LABPROT 15.6*  INR 1.21   ABG No results found for this basename: PHART:2,PCO2:2,PO2:2,HCO3:2 in the last 72 hours  Studies/Results: Ct Guided Abscess Drain  05/06/2012  *RADIOLOGY REPORT*  Indication: Infected hepatic cyst, now with hepatic abscess.  CT GUIDED HEPATIC ABSCESS DRAINAGE CATHETER PLACEMENT  Comparison: CT abdomen pelvis - 05/03/2012  Medications: Fentanyl 100 mcg IV; Versed 2 mg IV; Ciprofloxacin 400 mg IV  Total Moderate Sedation time: 42 minutes (procedure artificially lengthened secondary to the CT malfunctioning requiring rebooting).  Contrast: None  Complications: The patient developed post drain placement rigors and nausea which subsided with the administration of fluid bolus and the IV Zofran.  Technique / Findings:  Informed written consent was obtained from the patient after a discussion of the  risks, benefits and alternatives to treatment. The patient was placed supine on the CT gantry and a pre procedural CT was performed re-demonstrating the known abscess/fluid collection within the medial aspect of the posterior segment of the right lobe the liver.  The procedure was planned.   A timeout was performed prior to the initiation of the procedure.  The lateral aspect of the right upper abdomen was prepped and draped in the usual sterile fashion.   The overlying soft tissues were anesthetized with 1% lidocaine with epinephrine.  A 5 [place] Yueh sheath need was advanced in to the abscess/fluid collection resulting in aspiration of a small amount of purulent fluid.  As such, a short Amplatz super stiff wire was coiled within the abscess/fluid collection.   Appropriate positioning was confirmed with a limited CT scan.  The tract was serially dilated allowing placement of a 10 [place] all-purpose drainage catheter. Appropriate positioning was confirmed with a limited postprocedural CT scan.  90 ml of purulent fluid was aspirated.  The tube was connected to a drainage bag and sutured in place.  A dressing was placed.  As stated above, despite preprocedural antibiotics, the patient developed post procedural rigors and nausea which resolved with fluid bolus and IV Zofran.  Impression:  Successful CT guided placement of a 10 [place] all purpose drain catheter into the abscess within the medial aspect the posterior segment right lobe of the liver with aspiration of 90 mL of purulent fluid.  Samples were sent to  the laboratory as requested by the ordering clinical team.  Plan:  Given the development of postprocedural rigors, the patient will be admitted to the hospital for continued monitoring and the administration of intravenous antibiotics.   Original Report Authenticated By: Waynard Reeds, M.D.    Dg Chest Port 1 View  05/06/2012  *RADIOLOGY REPORT*  Clinical Data: Fever.  Rule out pneumonia  PORTABLE CHEST - 1  VIEW  Comparison: 05/01/2012  Findings: Heart size is upper normal.  Negative for heart failure. Lungs are clear without infiltrate or effusion.  Negative for pneumonia. Pigtail catheter in the liver.  IMPRESSION: No acute abnormality.   Original Report Authenticated By: Camelia Phenes, M.D.     Anti-infectives:   Assessment/Plan: s/p hepatic abscess drain placed 9/16 Feels better Output significant No growth so far Will need re CT when output less than 15 cc /24 hrs  Rockey Guarino A 05/08/2012

## 2012-05-08 NOTE — Evaluation (Signed)
Physical Therapy Evaluation Patient Details Name: Alyssa Fernandez MRN: [id number] DOB: Jul 07, 1941 Today's Date: 05/08/2012 Time: 4098-1191 PT Time Calculation (min): 15 min  PT Assessment / Plan / Recommendation Clinical Impression  Pt was admitted w/ fever , chills, recent percutaneous drain forhepatic abcess. Pt. is active and independent PTA. Pt. will benefit from PT to improve functiopnal independence.    PT Assessment  Patient needs continued PT services    Follow Up Recommendations  No PT follow up    Barriers to Discharge        Equipment Recommendations  None recommended by PT    Recommendations for Other Services     Frequency Min 3X/week    Precautions / Restrictions Precautions Precaution Comments: drain   Pertinent Vitals/Pain Nausea and abd. Pain. RN aware.      Mobility  Bed Mobility Bed Mobility: Supine to Sit;Sit to Supine Supine to Sit: 5: Supervision Sit to Supine: 5: Supervision Details for Bed Mobility Assistance: pt requires bno assistance Transfers Transfers: Sit to Stand;Stand to Sit Sit to Stand: 5: Supervision Ambulation/Gait Ambulation/Gait Assistance: 4: Min guard Ambulation Distance (Feet): 15 Feet (x2) Ambulation/Gait Assistance Details: Pt. holds to objects as she walked to BR. Gait Pattern: Step-through pattern    Exercises     PT Diagnosis: Generalized weakness;Acute pain  PT Problem List: Decreased activity tolerance;Decreased mobility;Pain PT Treatment Interventions: Gait training;Functional mobility training;Therapeutic activities;Patient/family education   PT Goals Acute Rehab PT Goals PT Goal Formulation: With patient Time For Goal Achievement: 05/22/12 Potential to Achieve Goals: Good Pt will go Sit to Stand: Independently PT Goal: Sit to Stand - Progress: Goal set today Pt will go Stand to Sit: Independently PT Goal: Stand to Sit - Progress: Goal set today Pt will Ambulate: >150 feet;Independently PT Goal: Ambulate -  Progress: Goal set today  Visit Information  Last PT Received On: 05/08/12 Assistance Needed: +1    Subjective Data  Subjective: I feel nauseated. I don't want to walk. Patient Stated Goal: to return home   Prior Functioning  Home Living Lives With: Spouse Type of Home: House Home Access: Stairs to enter Entrance Stairs-Number of Steps: 1 Home Layout: Two level;Able to live on main level with bedroom/bathroom Home Adaptive Equipment: None Prior Function Level of Independence: Independent Able to Take Stairs?: Yes Driving: Yes Communication Communication: No difficulties    Cognition  Overall Cognitive Status: Appears within functional limits for tasks assessed/performed Arousal/Alertness: Awake/alert Orientation Level: Appears intact for tasks assessed Behavior During Session: [facility] for tasks performed    Extremity/Trunk Assessment Right Lower Extremity Assessment RLE ROM/Strength/Tone: [facility] for tasks assessed Left Lower Extremity Assessment LLE ROM/Strength/Tone: [facility] for tasks assessed   Balance    End of Session PT - End of Session Activity Tolerance: Patient limited by fatigue;Treatment limited secondary to medical complications (Comment) (nausea) Patient left: in bed;with call bell/phone within reach;with family/visitor present Nurse Communication: Mobility status  GP     Rada Hay 05/08/2012, 4:08 PM  [phone]

## 2012-05-08 NOTE — Evaluation (Signed)
Occupational Therapy Evaluation Patient Details Name: Alyssa Fernandez MRN: [id number] DOB: 1941-03-18 Today's Date: 05/08/2012 Time: 7829-5621 OT Time Calculation (min): 16 min  OT Assessment / Plan / Recommendation Clinical Impression  This 71 year old female was admitted for hepatic abscess and presents with drain.  She is overall supervision for adls/bathroom transfers.  She is very active at baseline.  Husband can assist in setting things up if she needs assistance.  Pt does not need further OT at this time.      OT Assessment  Patient does not need any further OT services    Follow Up Recommendations  No OT follow up    Barriers to Discharge      Equipment Recommendations  None recommended by OT (pt does not have shower seat but does have grab bar and long)    Recommendations for Other Services    Frequency       Precautions / Restrictions Precautions Precautions: None Restrictions Weight Bearing Restrictions: No   Pertinent Vitals/Pain None at beginning of session; R side started to hurt during session.  Repositioned    ADL  Lower Body Bathing: Simulated;Set up Where Assessed - Lower Body Bathing: Unsupported sit to stand Lower Body Dressing: Simulated;Set up Where Assessed - Lower Body Dressing: Supported sit to stand Toilet Transfer: Research scientist (life sciences) Method: Sit to Barista: Comfort height toilet;Grab bars Toileting - Architect and Hygiene: Modified independent;Simulated Tub/Shower Transfer: Engineer, manufacturing Method: Science writer: Walk in shower Transfers/Ambulation Related to ADLs: supervision level for ambulating:  pt did reach for walls but no LOB. Had just gotten up for first time yesterday ADL Comments: crosses legs up so feet touch each other to reach feet for adls    OT Diagnosis:    OT Problem List:   OT Treatment Interventions:      OT Goals    Visit Information  Last OT Received On: 05/08/12 Assistance Needed: +1 PT/OT Co-Evaluation/Treatment: Yes    Subjective Data  Subjective: I walk 2 miles every day and go to the gym Patient Stated Goal: agreeable to OT, not sure if she needs it   Prior Functioning  Vision/Perception  Home Living Lives With: Spouse Type of Home: House Home Access: Stairs to enter Secretary/administrator of Steps: 1 Home Layout: Two level (lives on primarily level,except computer upstairs) Bathroom Shower/Tub: Health visitor: Handicapped height Additional Comments: walks 2 miles per day Prior Function Level of Independence: Independent Able to Take Stairs?: Yes Driving: Yes Communication Communication: No difficulties      Cognition  Overall Cognitive Status: Appears within functional limits for tasks assessed/performed Arousal/Alertness: Awake/alert Orientation Level: Appears intact for tasks assessed Behavior During Session: [facility] for tasks performed    Extremity/Trunk Assessment Right Upper Extremity Assessment RUE ROM/Strength/Tone: [facility] for tasks assessed Left Upper Extremity Assessment LUE ROM/Strength/Tone: [facility] for tasks assessed   Mobility  Shoulder Instructions  Bed Mobility Bed Mobility: Supine to Sit Supine to Sit: 5: Supervision Transfers Transfers: Sit to Stand Sit to Stand: 5: Supervision       Exercise     Balance     End of Session OT - End of Session Activity Tolerance: Patient limited by fatigue Pt left in bed, with call bell nearby and bed alarm reset  GO     Janecia Palau 05/08/2012, 11:26 AM Marica Otter, OTR/L [phone] 05/08/2012

## 2012-05-08 NOTE — Progress Notes (Signed)
Patient ID: Alyssa Fernandez, female   DOB: [birth year], 71 y.o.   MRN: [id number]    [facility] for Infectious Disease    Date of Admission:  05/06/2012   Total days of antibiotics 7        Day 3 piperacillin tazobactam and         Principal Problem:  *Hepatic abscess Active Problems:  Fever  HYPOTHYROIDISM  VITAMIN D DEFICIENCY  HYPERLIPIDEMIA  ESSENTIAL HYPERTENSION  GERD  POSTMENOPAUSAL SYNDROME  POLYCYSTIC KIDNEY DISEASE  Hypokalemia  Dehydration  Normocytic anemia      . amLODipine  5 mg Oral Daily  . atorvastatin  40 mg Oral q1800  . calcium carbonate  1 tablet Oral Daily  . citalopram  20 mg Oral Daily  . conjugated estrogens  1 Applicatorful Vaginal QHS  . estrogens (conjugated)  0.625 mg Oral Daily  . feeding supplement  1 Container Oral TID BM  . lisinopril  20 mg Oral Daily  . metoprolol  100 mg Oral BID  . ondansetron (ZOFRAN) IV  4 mg Intravenous Q6H  . pantoprazole  80 mg Oral Q1200  . piperacillin-tazobactam (ZOSYN)  IV  3.375 g Intravenous Q8H  . sodium chloride  3 mL Intravenous Q12H  . vitamin A & D      . vitamin C  250 mg Oral Daily    Subjective: Her abdominal pain is much better and she is eager to go home. However, she states that she doesn't want to go home before her abdominal drain is removed because she has no one there to help take care of her.  Objective: Temp:  [98.5 F (36.9 C)-99.1 F (37.3 C)] 98.6 F (37 C) (09/18 0532) Pulse Rate:  [61-84] 84  (09/18 0532) Resp:  [18-20] 18  (09/18 0532) BP: (102-143)/(64-77) 128/77 mmHg (09/18 0532) SpO2:  [92 %-97 %] 95 % (09/18 0532) Weight:  [62.642 kg (138 lb 1.6 oz)] 62.642 kg (138 lb 1.6 oz) (09/18 0532)  General: She is alert and smiling Lungs: Clear Cor: Regular S1-S2 no murmur Abdomen: Soft and nontender 150 cc out from her hepatic drain yesterday  Lab Results Lab Results  Component Value Date   WBC 10.3 05/08/2012   HGB 10.4* 05/08/2012   HCT 31.9* 05/08/2012   MCV 90.9  05/08/2012   PLT 605* 05/08/2012    Lab Results  Component Value Date   CREATININE 0.91 05/08/2012   BUN 6 05/08/2012   NA 133* 05/08/2012   K 4.1 05/08/2012   CL 100 05/08/2012   CO2 24 05/08/2012    Lab Results  Component Value Date   ALT 5 05/07/2012   AST 12 05/07/2012   ALKPHOS 61 05/07/2012   BILITOT 0.4 05/07/2012      Microbiology: Recent Results (from the past 240 hour(s))  CULTURE, ROUTINE-ABSCESS     Status: Normal (Preliminary result)   Collection Time   05/06/12  2:03 PM      Component Value Range Status Comment   Specimen Description DRAINAGE HEPATIC CYST   Final    Special Requests Normal   Final    Gram Stain     Final    Value: ABUNDANT WBC PRESENT,BOTH PMN AND MONONUCLEAR     NO SQUAMOUS EPITHELIAL CELLS SEEN     NO ORGANISMS SEEN   Culture NO GROWTH 1 DAY   Final    Report Status PENDING   Incomplete   ANAEROBIC CULTURE     Status: Normal (Preliminary result)  Collection Time   05/06/12  2:03 PM      Component Value Range Status Comment   Specimen Description DRAINAGE HEPATIC CYST   Final    Special Requests Normal   Final    Gram Stain     Final    Value: ABUNDANT WBC PRESENT,BOTH PMN AND MONONUCLEAR     NO SQUAMOUS EPITHELIAL CELLS SEEN     NO ORGANISMS SEEN   Culture     Final    Value: NO ANAEROBES ISOLATED; CULTURE IN PROGRESS FOR 5 DAYS   Report Status PENDING   Incomplete   CULTURE, BLOOD (ROUTINE X 2)     Status: Normal (Preliminary result)   Collection Time   05/06/12  6:10 PM      Component Value Range Status Comment   Specimen Description BLOOD RIGHT HAND   Final    Special Requests BOTTLES DRAWN AEROBIC AND ANAEROBIC 10CC   Final    Culture  Setup Time 05/06/2012 23:01   Final    Culture     Final    Value:        BLOOD CULTURE RECEIVED NO GROWTH TO DATE CULTURE WILL BE HELD FOR 5 DAYS BEFORE ISSUING A FINAL NEGATIVE REPORT   Report Status PENDING   Incomplete   CULTURE, BLOOD (ROUTINE X 2)     Status: Normal (Preliminary result)   Collection  Time   05/06/12  6:15 PM      Component Value Range Status Comment   Specimen Description BLOOD RIGHT ARM   Final    Special Requests BOTTLES DRAWN AEROBIC AND ANAEROBIC 10CC   Final    Culture  Setup Time 05/06/2012 23:02   Final    Culture     Final    Value:        BLOOD CULTURE RECEIVED NO GROWTH TO DATE CULTURE WILL BE HELD FOR 5 DAYS BEFORE ISSUING A FINAL NEGATIVE REPORT   Report Status PENDING   Incomplete   URINE CULTURE     Status: Normal   Collection Time   05/06/12  9:04 PM      Component Value Range Status Comment   Specimen Description URINE, CLEAN CATCH   Final    Special Requests NONE   Final    Culture  Setup Time 05/07/2012 02:19   Final    Colony Count NO GROWTH   Final    Culture NO GROWTH   Final    Report Status 05/07/2012 FINAL   Final     Studies/Results: Ct Guided Abscess Drain  05/06/2012  *RADIOLOGY REPORT*  Indication: Infected hepatic cyst, now with hepatic abscess.  CT GUIDED HEPATIC ABSCESS DRAINAGE CATHETER PLACEMENT  Comparison: CT abdomen pelvis - 05/03/2012  Medications: Fentanyl 100 mcg IV; Versed 2 mg IV; Ciprofloxacin 400 mg IV  Total Moderate Sedation time: 42 minutes (procedure artificially lengthened secondary to the CT malfunctioning requiring rebooting).  Contrast: None  Complications: The patient developed post drain placement rigors and nausea which subsided with the administration of fluid bolus and the IV Zofran.  Technique / Findings:  Informed written consent was obtained from the patient after a discussion of the risks, benefits and alternatives to treatment. The patient was placed supine on the CT gantry and a pre procedural CT was performed re-demonstrating the known abscess/fluid collection within the medial aspect of the posterior segment of the right lobe the liver.  The procedure was planned.   A timeout was performed prior to the initiation  of the procedure.  The lateral aspect of the right upper abdomen was prepped and draped in the usual  sterile fashion.   The overlying soft tissues were anesthetized with 1% lidocaine with epinephrine.  A 5 [place] Yueh sheath need was advanced in to the abscess/fluid collection resulting in aspiration of a small amount of purulent fluid.  As such, a short Amplatz super stiff wire was coiled within the abscess/fluid collection.   Appropriate positioning was confirmed with a limited CT scan.  The tract was serially dilated allowing placement of a 10 [place] all-purpose drainage catheter. Appropriate positioning was confirmed with a limited postprocedural CT scan.  90 ml of purulent fluid was aspirated.  The tube was connected to a drainage bag and sutured in place.  A dressing was placed.  As stated above, despite preprocedural antibiotics, the patient developed post procedural rigors and nausea which resolved with fluid bolus and IV Zofran.  Impression:  Successful CT guided placement of a 10 [place] all purpose drain catheter into the abscess within the medial aspect the posterior segment right lobe of the liver with aspiration of 90 mL of purulent fluid.  Samples were sent to the laboratory as requested by the ordering clinical team.  Plan:  Given the development of postprocedural rigors, the patient will be admitted to the hospital for continued monitoring and the administration of intravenous antibiotics.   Original Report Authenticated By: Waynard Reeds, M.D.    Dg Chest Port 1 View  05/06/2012  *RADIOLOGY REPORT*  Clinical Data: Fever.  Rule out pneumonia  PORTABLE CHEST - 1 VIEW  Comparison: 05/01/2012  Findings: Heart size is upper normal.  Negative for heart failure. Lungs are clear without infiltrate or effusion.  Negative for pneumonia. Pigtail catheter in the liver.  IMPRESSION: No acute abnormality.   Original Report Authenticated By: Camelia Phenes, M.D.     Assessment: She is improving and has defervesced. Her cultures are negative, possibly due to prior treatment with ciprofloxacin and  metronidazole. I will switch her to an oral regimen of levofloxacin and metronidazole. This will give slightly better gram-positive coverage. She will need a minimum of 3 weeks of antibiotic therapy after abscess drainage. Because the drain is in an infected but pre-existing cyst drain output they remain high for the next few days.  Plan: 1. Change piperacillin tazobactam to oral levofloxacin and metronidazole pending final cultures  Cliffton Asters, MD [facility] for Infectious Disease [facility] [phone] pager   [phone] cell 05/08/2012, 8:32 AM

## 2012-05-09 LAB — CBC WITH DIFFERENTIAL/PLATELET
Basophils Absolute: 0 10*3/uL (ref 0.0–0.1)
Basophils Relative: 1 % (ref 0–1)
Eosinophils Absolute: 0.1 10*3/uL (ref 0.0–0.7)
Eosinophils Relative: 2 % (ref 0–5)
HCT: 30.2 % — ABNORMAL LOW (ref 36.0–46.0)
Lymphocytes Relative: 16 % (ref 12–46)
MCH: 29.8 pg (ref 26.0–34.0)
MCHC: 33.1 g/dL (ref 30.0–36.0)
MCV: 89.9 fL (ref 78.0–100.0)
Monocytes Absolute: 0.5 10*3/uL (ref 0.1–1.0)
Platelets: 508 10*3/uL — ABNORMAL HIGH (ref 150–400)
RDW: 13.4 % (ref 11.5–15.5)
WBC: 6.2 10*3/uL (ref 4.0–10.5)

## 2012-05-09 NOTE — Progress Notes (Signed)
[facility] PROGRESS NOTE  Alyssa Fernandez ZOX:[id number] DOB: May 29, 1941 DOA: 05/06/2012 PCP: Oliver Barre, MD  Assessment/Plan: Principal Problem:  *Hepatic abscess Active Problems:  HYPOTHYROIDISM  VITAMIN D DEFICIENCY  HYPERLIPIDEMIA  ESSENTIAL HYPERTENSION  GERD  POSTMENOPAUSAL SYNDROME  POLYCYSTIC KIDNEY DISEASE  Hypokalemia  Fever  Dehydration  Normocytic anemia   #1 acute hepatic abscess Status post percutaneous drain. Anaerobic Cultures are pending-facial polymorphonuclear cells. WBC is trending down. Patient is currently afebrile. Zosyn changed to by mouth Flagyl and Cipro 9/18 per infectious disease specialist. When drainage  less than 10 cc per day will likely require repeat CT of the abdomen and pelvis prior to drain removal. ID has been consulted and appreciate input and recommendations. Interventional radiology is following-appreciate input Discharged home tomorrow with CT of the abdomen with home health instructions. I will follow the output tomorrow. Apparently patient has had only 3 cc out today and can get this done tomorrow morning  #2 fever Likely secondary to problem #1. Blood cultures are negative so far from 05/06/12. UA is negative. Chest x-ray is negative.  Follow.  #3 gastroesophageal reflux disease PPI.  #4 hypokalemia Repleted-potassium stable  #5 dehydration Gentle IV hydration-discontinue IV fluids 9/18 continue by mouth fluids  #6 hypertension Stable. Continue Lopressor 100 mg twice a day and lisinopril 20 mg daily.  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 female with past medical history of hypertension, polycystic kidney disease, anxiety, hypothyroidism, gastroesophageal reflux disease who had presented to interventional radiology on the day of admission for drain placement secondary to a hepatic abscess.  Patient states that developed a fever of 102, right upper quadrant pain, right-sided bloating 7-10 days prior to admission which was progressively getting worse and a such subsequently presented to her PCPs office to be assessed.  Patient states she was seen in the PCPs office were CT of the abdomen and pelvis were done which was concerning for hepatic abscess. Patient stated that her PCPs office called her at home 3 days prior to admission asking her to present to the ED for admission and further evaluation however patient stated that she refused as she was starting to feel better denied any emesis as stated her abdominal pain was improving. Patient was subsequently called and sent to interventional radiology for hepatic drain placement. Patient states prior to admission was being treated empirically at home with oral Cipro and Flagyl. Patient denies any shortness of breath, denies any headache, no chest pain, no nausea, no dysuria, patient does endorse a nonproductive cough, an episode of diarrhea, an episode of emesis after drinking oral contrast. Postprocedure patient was noted to spike a fever 102, have nausea and vomiting. Will call to admit the patient for further evaluation and management.   Consultants:  Interventional radiology Dr. Grace Isaac 05/06/2012  Infectious disease Dr. Orvan Falconer 05/07/2012  Procedures:  Placement of 10 French drain into hepatic abscess within the posterior segment of the right lobe of the liver per interventional radiology 05/06/2012  Antibiotics:  IV  Zosyn 05/06/2012 >9/18  Cipro and Flagyl 9/18 >>>  HPI/Subjective: Patient doing very well today. He states that she would like to eat real food. Ambulating well. No fever or chills no abdominal pain no nausea no vomiting no chest pain.  Objective: Filed Vitals:   05/08/12 2043 05/09/12 0534 05/09/12 1004 05/09/12 1401  BP: 144/69 149/54 135/74 120/74  Pulse: 73 66  60  Temp: 98.4 F (36.9 C) 98.2 F (36.8 C)  98.7 F (37.1 C)  TempSrc: Oral Oral  Oral  Resp: 18 18  16   Height:      Weight:  60.873 kg (134 lb 3.2 oz)    SpO2: 97% 95%  93%    Intake/Output Summary (Last 24 hours) at 05/09/12 1758 Last data filed at 05/09/12 1706  Gross per 24 hour  Intake    980 ml  Output   3783 ml  Net  -2803 ml   Filed Weights   05/07/12 0553 05/08/12 0532 05/09/12 0534  Weight: 60.6 kg (133 lb 9.6 oz) 62.642 kg (138 lb 1.6 oz) 60.873 kg (134 lb 3.2 oz)    Exam:   General:  NAD  Cardiovascular: RRR  Respiratory: CTAB  Abdomen: SOFT/ Decreasing TTP in RUQ, ND, +BS. Right-sided percutaneous drain-minimal drainage noted  Data Reviewed: Basic Metabolic Panel:  Lab 05/08/12 1610 05/07/12 0353 05/06/12 1923 05/06/12 1810 05/06/12 1134  NA 133* 133* -- 133* 132*  K 4.1 4.1 -- 2.9* 2.3*  CL 100 100 -- 93* 90*  CO2 24 27 -- 30 31  GLUCOSE 90 103* -- 115* 99  BUN 6 9 -- 11 11  CREATININE 0.91 0.95 -- 0.97 0.94  CALCIUM 8.1* 8.3* -- 8.3* 8.5  MG -- 2.1 -- 1.8 --  PHOS -- -- 2.7 -- --   Liver Function Tests:  Lab 05/07/12 0353 05/06/12 1810  AST 12 14  ALT 5 <5  ALKPHOS 61 65  BILITOT 0.4 0.4  PROT 5.6* 6.1  ALBUMIN 2.3* 2.5*   No results found for this basename: LIPASE:5,AMYLASE:5 in the last 168 hours No results found for this basename: AMMONIA:5 in the last 168 hours CBC:  Lab 05/09/12 0405 05/08/12 0423 05/07/12 0353 05/06/12 1810 05/06/12 1107  WBC 6.2 10.3 13.7* 20.3* 10.8*  NEUTROABS 4.6 8.1* 12.0* 19.0* --  HGB 10.0* 10.4* 9.8* 10.5* 11.1*  HCT 30.2* 31.9*  30.5* 31.6* 32.9*  MCV 89.9 90.9 90.8 88.8 88.0  PLT 508* 605* 510* 585* 627*   Cardiac Enzymes: No results found for this basename: CKTOTAL:5,CKMB:5,CKMBINDEX:5,TROPONINI:5 in the last 168 hours BNP (last 3 results) No results found for this basename: PROBNP:3 in the last 8760 hours CBG: No results found for this basename: GLUCAP:5 in the last 168 hours  Recent Results (from the past 240 hour(s))  CULTURE, ROUTINE-ABSCESS     Status: Normal (Preliminary result)   Collection Time   05/06/12  2:03 PM      Component Value Range Status Comment   Specimen Description DRAINAGE HEPATIC CYST   Final    Special Requests Normal   Final    Gram Stain     Final    Value: ABUNDANT WBC PRESENT,BOTH PMN AND MONONUCLEAR     NO SQUAMOUS EPITHELIAL CELLS SEEN     NO ORGANISMS SEEN   Culture NO GROWTH 2 DAYS   Final    Report Status PENDING   Incomplete   ANAEROBIC CULTURE     Status: Normal (Preliminary result)   Collection Time  05/06/12  2:03 PM      Component Value Range Status Comment   Specimen Description DRAINAGE HEPATIC CYST   Final    Special Requests Normal   Final    Gram Stain     Final    Value: ABUNDANT WBC PRESENT,BOTH PMN AND MONONUCLEAR     NO SQUAMOUS EPITHELIAL CELLS SEEN     NO ORGANISMS SEEN   Culture     Final    Value: NO ANAEROBES ISOLATED; CULTURE IN PROGRESS FOR 5 DAYS   Report Status PENDING   Incomplete   CULTURE, BLOOD (ROUTINE X 2)     Status: Normal (Preliminary result)   Collection Time   05/06/12  6:10 PM      Component Value Range Status Comment   Specimen Description BLOOD RIGHT HAND   Final    Special Requests BOTTLES DRAWN AEROBIC AND ANAEROBIC 10CC   Final    Culture  Setup Time 05/06/2012 23:01   Final    Culture     Final    Value:        BLOOD CULTURE RECEIVED NO GROWTH TO DATE CULTURE WILL BE HELD FOR 5 DAYS BEFORE ISSUING A FINAL NEGATIVE REPORT   Report Status PENDING   Incomplete   CULTURE, BLOOD (ROUTINE X 2)     Status: Normal (Preliminary  result)   Collection Time   05/06/12  6:15 PM      Component Value Range Status Comment   Specimen Description BLOOD RIGHT ARM   Final    Special Requests BOTTLES DRAWN AEROBIC AND ANAEROBIC 10CC   Final    Culture  Setup Time 05/06/2012 23:02   Final    Culture     Final    Value:        BLOOD CULTURE RECEIVED NO GROWTH TO DATE CULTURE WILL BE HELD FOR 5 DAYS BEFORE ISSUING A FINAL NEGATIVE REPORT   Report Status PENDING   Incomplete   URINE CULTURE     Status: Normal   Collection Time   05/06/12  9:04 PM      Component Value Range Status Comment   Specimen Description URINE, CLEAN CATCH   Final    Special Requests NONE   Final    Culture  Setup Time 05/07/2012 02:19   Final    Colony Count NO GROWTH   Final    Culture NO GROWTH   Final    Report Status 05/07/2012 FINAL   Final      Studies: No results found.  Scheduled Meds:    . amLODipine  5 mg Oral Daily  . atorvastatin  40 mg Oral q1800  . calcium carbonate  1 tablet Oral Daily  . citalopram  20 mg Oral Daily  . conjugated estrogens  1 Applicatorful Vaginal QHS  . estrogens (conjugated)  0.625 mg Oral Daily  . feeding supplement  1 Container Oral TID BM  . levofloxacin  750 mg Oral Daily  . lisinopril  20 mg Oral Daily  . metoprolol  100 mg Oral BID  . metroNIDAZOLE  500 mg Oral Q8H  . ondansetron (ZOFRAN) IV  4 mg Intravenous Q6H  . pantoprazole  80 mg Oral Q1200  . sodium chloride  3 mL Intravenous Q12H  . vitamin C  250 mg Oral Daily   Continuous Infusions:    . DISCONTD: sodium chloride 100 mL/hr at 05/08/12 1601    Principal Problem:  *Hepatic abscess Active Problems:  HYPOTHYROIDISM  VITAMIN  D DEFICIENCY  HYPERLIPIDEMIA  ESSENTIAL HYPERTENSION  GERD  POSTMENOPAUSAL SYNDROME  POLYCYSTIC KIDNEY DISEASE  Hypokalemia  Fever  Dehydration  Normocytic anemia    Time spent: 25 mins    Mahala Menghini [facility]  [facility] Pager [phone]. If 8PM-8AM, please contact night-coverage at  [email or web address], password [facility] 05/09/2012, 5:58 PM  LOS: 3 days

## 2012-05-09 NOTE — Progress Notes (Signed)
Physical Therapy Treatment Patient Details Name: Alyssa Fernandez MRN: [id number] DOB: 1941/06/17 Today's Date: 05/09/2012 Time: 4098-1191 PT Time Calculation (min): 24 min  PT Assessment / Plan / Recommendation Comments on Treatment Session  Pt very willing to move around and get better.  Pt admits, "I am not ready to go home yet" stated after she walked.    Follow Up Recommendations  No PT follow up    Barriers to Discharge        Equipment Recommendations  None recommended by PT    Recommendations for Other Services    Frequency     Plan Discharge plan remains appropriate    Precautions / Restrictions     Pertinent Vitals/Pain C/o mild ABD discomfort    Mobility  Bed Mobility Bed Mobility: Supine to Sit;Sit to Supine Supine to Sit: 4: Min guard Sit to Supine: 4: Min guard Details for Bed Mobility Assistance: increased time 2nd mild c/o ABD discomfort  Transfers Transfers: Sit to Stand;Stand to Sit Sit to Stand: 4: Min guard;From bed Stand to Sit: 4: Min assist;To bed Details for Transfer Assistance: increased time 2nd mild ABD discomfort  Ambulation/Gait Ambulation/Gait Assistance: 4: Min guard Ambulation Distance (Feet): 100 Feet (50' x 2) Assistive device: None Ambulation/Gait Assistance Details: HHA and very slow gait Gait Pattern: Step-through pattern     PT Goals                          progressing    Visit Information  Last PT Received On: 05/09/12 Assistance Needed: +1                   End of Session PT - End of Session Equipment Utilized During Treatment: Gait belt Activity Tolerance: Patient limited by fatigue;Patient limited by pain    Felecia Shelling  PTA [facility]  Acute  Rehab Pager     [phone]

## 2012-05-09 NOTE — Progress Notes (Signed)
Subjective: Pt without new c/o; eager to go home  Objective: Vital signs in last 24 hours: Temp:  [98 F (36.7 C)-98.7 F (37.1 C)] 98.7 F (37.1 C) (09/19 1401) Pulse Rate:  [60-81] 60  (09/19 1401) Resp:  [16-18] 16  (09/19 1401) BP: (120-149)/(54-74) 120/74 mmHg (09/19 1401) SpO2:  [93 %-98 %] 93 % (09/19 1401) Weight:  [134 lb 3.2 oz (60.873 kg)] 134 lb 3.2 oz (60.873 kg) (09/19 0534) Last BM Date: 05/07/12  Intake/Output from previous day: 09/18 0701 - 09/19 0700 In: 1782 [P.O.:960; I.V.:800; IV Piggyback:12] Out: 1955 [Urine:1925; Drains:30] Intake/Output this shift: Total I/O In: -  Out: 1150 [Urine:1150]  Hepatic drain intact, output about 30 cc's today; cx's/cyt pend; drain flushed with 10 cc's sterile NS with return of same amt  Lab Results:   Basename 05/09/12 0405 05/08/12 0423  WBC 6.2 10.3  HGB 10.0* 10.4*  HCT 30.2* 31.9*  PLT 508* 605*   BMET  Basename 05/08/12 0423 05/07/12 0353  NA 133* 133*  K 4.1 4.1  CL 100 100  CO2 24 27  GLUCOSE 90 103*  BUN 6 9  CREATININE 0.91 0.95  CALCIUM 8.1* 8.3*   PT/INR No results found for this basename: LABPROT:2,INR:2 in the last 72 hours ABG No results found for this basename: PHART:2,PCO2:2,PO2:2,HCO3:2 in the last 72 hours Results for orders placed during the hospital encounter of 05/06/12  CULTURE, ROUTINE-ABSCESS     Status: Normal (Preliminary result)   Collection Time   05/06/12  2:03 PM      Component Value Range Status Comment   Specimen Description DRAINAGE HEPATIC CYST   Final    Special Requests Normal   Final    Gram Stain     Final    Value: ABUNDANT WBC PRESENT,BOTH PMN AND MONONUCLEAR     NO SQUAMOUS EPITHELIAL CELLS SEEN     NO ORGANISMS SEEN   Culture NO GROWTH 2 DAYS   Final    Report Status PENDING   Incomplete   ANAEROBIC CULTURE     Status: Normal (Preliminary result)   Collection Time   05/06/12  2:03 PM      Component Value Range Status Comment   Specimen Description  DRAINAGE HEPATIC CYST   Final    Special Requests Normal   Final    Gram Stain     Final    Value: ABUNDANT WBC PRESENT,BOTH PMN AND MONONUCLEAR     NO SQUAMOUS EPITHELIAL CELLS SEEN     NO ORGANISMS SEEN   Culture     Final    Value: NO ANAEROBES ISOLATED; CULTURE IN PROGRESS FOR 5 DAYS   Report Status PENDING   Incomplete   CULTURE, BLOOD (ROUTINE X 2)     Status: Normal (Preliminary result)   Collection Time   05/06/12  6:10 PM      Component Value Range Status Comment   Specimen Description BLOOD RIGHT HAND   Final    Special Requests BOTTLES DRAWN AEROBIC AND ANAEROBIC 10CC   Final    Culture  Setup Time 05/06/2012 23:01   Final    Culture     Final    Value:        BLOOD CULTURE RECEIVED NO GROWTH TO DATE CULTURE WILL BE HELD FOR 5 DAYS BEFORE ISSUING A FINAL NEGATIVE REPORT   Report Status PENDING   Incomplete   CULTURE, BLOOD (ROUTINE X 2)     Status: Normal (Preliminary result)   Collection  Time   05/06/12  6:15 PM      Component Value Range Status Comment   Specimen Description BLOOD RIGHT ARM   Final    Special Requests BOTTLES DRAWN AEROBIC AND ANAEROBIC 10CC   Final    Culture  Setup Time 05/06/2012 23:02   Final    Culture     Final    Value:        BLOOD CULTURE RECEIVED NO GROWTH TO DATE CULTURE WILL BE HELD FOR 5 DAYS BEFORE ISSUING A FINAL NEGATIVE REPORT   Report Status PENDING   Incomplete   URINE CULTURE     Status: Normal   Collection Time   05/06/12  9:04 PM      Component Value Range Status Comment   Specimen Description URINE, CLEAN CATCH   Final    Special Requests NONE   Final    Culture  Setup Time 05/07/2012 02:19   Final    Colony Count NO GROWTH   Final    Culture NO GROWTH   Final    Report Status 05/07/2012 FINAL   Final     Studies/Results: No results found.  Anti-infectives: Anti-infectives     Start     Dose/Rate Route Frequency Ordered Stop   05/08/12 1000   levofloxacin (LEVAQUIN) tablet 750 mg        750 mg Oral Daily 05/08/12 0835      05/08/12 0900   metroNIDAZOLE (FLAGYL) tablet 500 mg        500 mg Oral 3 times per day 05/08/12 0835     05/06/12 2200   ciprofloxacin (CIPRO) tablet 500 mg  Status:  Discontinued        500 mg Oral 2 times daily 05/06/12 1653 05/06/12 1813   05/06/12 2200   metroNIDAZOLE (FLAGYL) tablet 500 mg  Status:  Discontinued        500 mg Oral 3 times daily 05/06/12 1653 05/06/12 1813   05/06/12 2200   piperacillin-tazobactam (ZOSYN) IVPB 3.375 g  Status:  Discontinued        3.375 g 12.5 mL/hr over 240 Minutes Intravenous Every 12 hours 05/06/12 1654 05/06/12 1813   05/06/12 1830   piperacillin-tazobactam (ZOSYN) IVPB 3.375 g  Status:  Discontinued        3.375 g 12.5 mL/hr over 240 Minutes Intravenous Every 8 hours 05/06/12 1815 05/08/12 0835   05/06/12 1315   ciprofloxacin (CIPRO) IVPB 400 mg     Comments: Pt in ct radiology      400 mg 200 mL/hr over 60 Minutes Intravenous  Once 05/06/12 1309 05/06/12 1419          Assessment/Plan: s/p hepatic abscess drainage 9/16; check final cx's/cytology; obtain f/u CT within 1 week of placement, esp if outputs cont to decline to assess adequacy of drainage; ambulate in hallway/OOB  LOS: 3 days    ALLRED,D [facility] 05/09/2012

## 2012-05-09 NOTE — Progress Notes (Signed)
Patient ID: Alyssa Fernandez, female   DOB: 07/26/1941, 71 y.o.   MRN: [id number]    [facility] for Infectious Disease    Date of Admission:  05/06/2012   Total days of antibiotics 8        Day 2 levofloxacin        Day 2 metronidazole          Principal Problem:  *Hepatic abscess Active Problems:  Fever  HYPOTHYROIDISM  VITAMIN D DEFICIENCY  HYPERLIPIDEMIA  ESSENTIAL HYPERTENSION  GERD  POSTMENOPAUSAL SYNDROME  POLYCYSTIC KIDNEY DISEASE  Hypokalemia  Dehydration  Normocytic anemia      . amLODipine  5 mg Oral Daily  . atorvastatin  40 mg Oral q1800  . calcium carbonate  1 tablet Oral Daily  . citalopram  20 mg Oral Daily  . conjugated estrogens  1 Applicatorful Vaginal QHS  . estrogens (conjugated)  0.625 mg Oral Daily  . feeding supplement  1 Container Oral TID BM  . levofloxacin  750 mg Oral Daily  . lisinopril  20 mg Oral Daily  . metoprolol  100 mg Oral BID  . metroNIDAZOLE  500 mg Oral Q8H  . ondansetron (ZOFRAN) IV  4 mg Intravenous Q6H  . pantoprazole  80 mg Oral Q1200  . sodium chloride  3 mL Intravenous Q12H  . vitamin C  250 mg Oral Daily    Subjective: She is feeling much better. She is hungry and wants to advance her diet.  Objective: Temp:  [98.2 F (36.8 C)-98.7 F (37.1 C)] 98.7 F (37.1 C) (09/19 1401) Pulse Rate:  [60-73] 60  (09/19 1401) Resp:  [16-18] 16  (09/19 1401) BP: (120-149)/(54-74) 120/74 mmHg (09/19 1401) SpO2:  [93 %-97 %] 93 % (09/19 1401) Weight:  [60.873 kg (134 lb 3.2 oz)] 60.873 kg (134 lb 3.2 oz) (09/19 0534)  General: She is smiling and in good spirits eating her dinner Abdomen: Soft and nontender About 30 cc of hepatic drain output yesterday. Scant drainage in the bag today  Lab Results Lab Results  Component Value Date   WBC 6.2 05/09/2012   HGB 10.0* 05/09/2012   HCT 30.2* 05/09/2012   MCV 89.9 05/09/2012   PLT 508* 05/09/2012    Microbiology: Recent Results (from the past 240 hour(s))  CULTURE, ROUTINE-ABSCESS      Status: Normal (Preliminary result)   Collection Time   05/06/12  2:03 PM      Component Value Range Status Comment   Specimen Description DRAINAGE HEPATIC CYST   Final    Special Requests Normal   Final    Gram Stain     Final    Value: ABUNDANT WBC PRESENT,BOTH PMN AND MONONUCLEAR     NO SQUAMOUS EPITHELIAL CELLS SEEN     NO ORGANISMS SEEN   Culture NO GROWTH 2 DAYS   Final    Report Status PENDING   Incomplete   ANAEROBIC CULTURE     Status: Normal (Preliminary result)   Collection Time   05/06/12  2:03 PM      Component Value Range Status Comment   Specimen Description DRAINAGE HEPATIC CYST   Final    Special Requests Normal   Final    Gram Stain     Final    Value: ABUNDANT WBC PRESENT,BOTH PMN AND MONONUCLEAR     NO SQUAMOUS EPITHELIAL CELLS SEEN     NO ORGANISMS SEEN   Culture     Final    Value: NO ANAEROBES  ISOLATED; CULTURE IN PROGRESS FOR 5 DAYS   Report Status PENDING   Incomplete   CULTURE, BLOOD (ROUTINE X 2)     Status: Normal (Preliminary result)   Collection Time   05/06/12  6:10 PM      Component Value Range Status Comment   Specimen Description BLOOD RIGHT HAND   Final    Special Requests BOTTLES DRAWN AEROBIC AND ANAEROBIC 10CC   Final    Culture  Setup Time 05/06/2012 23:01   Final    Culture     Final    Value:        BLOOD CULTURE RECEIVED NO GROWTH TO DATE CULTURE WILL BE HELD FOR 5 DAYS BEFORE ISSUING A FINAL NEGATIVE REPORT   Report Status PENDING   Incomplete   CULTURE, BLOOD (ROUTINE X 2)     Status: Normal (Preliminary result)   Collection Time   05/06/12  6:15 PM      Component Value Range Status Comment   Specimen Description BLOOD RIGHT ARM   Final    Special Requests BOTTLES DRAWN AEROBIC AND ANAEROBIC 10CC   Final    Culture  Setup Time 05/06/2012 23:02   Final    Culture     Final    Value:        BLOOD CULTURE RECEIVED NO GROWTH TO DATE CULTURE WILL BE HELD FOR 5 DAYS BEFORE ISSUING A FINAL NEGATIVE REPORT   Report Status PENDING    Incomplete   URINE CULTURE     Status: Normal   Collection Time   05/06/12  9:04 PM      Component Value Range Status Comment   Specimen Description URINE, CLEAN CATCH   Final    Special Requests NONE   Final    Culture  Setup Time 05/07/2012 02:19   Final    Colony Count NO GROWTH   Final    Culture NO GROWTH   Final    Report Status 05/07/2012 FINAL   Final    Assessment: She is improving on empiric therapy for 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 Cultures are negative but were obtained after antibiotic therapy was started. She needs a followup CT scan when the drain output decreases but this could be done as an outpatient if she agrees. I will plan on at least 4 weeks of antibiotic therapy. I will arrange followup in our clinic.  Plan: 1. Continue levofloxacin and metronidazole 2. Followup in our clinic in about 2 weeks 3. Please call if we can be of further assistance while she is here are  Cliffton Asters, MD [facility] for Infectious Disease [facility] [phone] pager   [phone] cell 05/09/2012, 5:44 PM

## 2012-05-10 ENCOUNTER — Inpatient Hospital Stay (HOSPITAL_COMMUNITY): Payer: Medicare Other

## 2012-05-10 LAB — CULTURE, ROUTINE-ABSCESS

## 2012-05-10 MED ORDER — IOHEXOL 300 MG/ML  SOLN
100.0000 mL | Freq: Once | INTRAMUSCULAR | Status: AC | PRN
  Administered 2012-05-10: 100 mL via INTRAVENOUS

## 2012-05-10 MED ORDER — LEVOFLOXACIN 750 MG PO TABS: 750.0000 mg | ORAL_TABLET | Freq: Every day | ORAL | Status: DC

## 2012-05-10 MED ORDER — METRONIDAZOLE 500 MG PO TABS: 500.0000 mg | ORAL_TABLET | Freq: Three times a day (TID) | ORAL | Status: DC

## 2012-05-10 NOTE — Progress Notes (Signed)
Talked to patient about DCP/HHC; Home Health Care choices given, patient chose [facility] for [facility]; Norberta Keens RN with [facility] called for arrangements; B Ave Filter RN,BSN,MHA

## 2012-05-10 NOTE — Progress Notes (Signed)
Subjective: Pt ok. No new c/o. On reg diet since yesterday  Objective: Physical Exam: BP 130/86  Pulse 84  Temp 98.6 F (37 C) (Oral)  Resp 18  Ht 5\' 3"  (1.6 m)  Wt 132 lb 15 oz (60.3 kg)  BMI 23.55 kg/m2  SpO2 95% Drain intact, site clean, NT Out serous, no pus. 13cc recorded yesterday, looks like only flush in drain right now.   Labs: CBC  Basename 05/09/12 0405 05/08/12 0423  WBC 6.2 10.3  HGB 10.0* 10.4*  HCT 30.2* 31.9*  PLT 508* 605*   BMET  Basename 05/08/12 0423  NA 133*  K 4.1  CL 100  CO2 24  GLUCOSE 90  BUN 6  CREATININE 0.91  CALCIUM 8.1*   LFT No results found for this basename: PROT,ALBUMIN,AST,ALT,ALKPHOS,BILITOT,BILIDIR,IBILI,LIPASE in the last 72 hours PT/INR No results found for this basename: LABPROT:2,INR:2 in the last 72 hours   Studies/Results: No results found.  Assessment/Plan: Hepatic abscess s/p perc drain 9/16 WBC normal, afeb, Cx neg so far Minimal output. Could repeat CT w/contrast to reassess and possibly pull drain today.    LOS: 4 days    Brayton El PA-C 05/10/2012 8:44 AM

## 2012-05-10 NOTE — Discharge Summary (Addendum)
Physician Discharge Summary  Alyssa Fernandez:[id number] DOB: [birth year] DOA: 05/06/2012  PCP: Oliver Barre, MD  Admit date: 05/06/2012 Discharge date: 05/10/2012  Recommendations for Outpatient Follow-up:  1. Needs CT scan potentially in one to 2 weeks and continues flushing. Will need to followup with both interventional radiology/primary-care physician and infectious disease for this issue  2. Continue by mouth Cipro and Flagyl until October 9-needs followup with infectious disease which will be arranged per Dr. Orvan Falconer 2 weeks from day of discharge-?Oct 4th 3. Home health nursing as the erect arranged for catheter flushing-potentially needs repeat CT scan in 7-10 days for report on CT scan done on day of discharge 05/11/2011-Follow up has been arranged with Dr. Vella Redhead for IR drain removal-needs CT and then f/u with them.  Discharge Diagnoses:  Principal Problem:  *Hepatic abscess Active Problems:  HYPOTHYROIDISM  VITAMIN D DEFICIENCY  HYPERLIPIDEMIA  ESSENTIAL HYPERTENSION  GERD  POSTMENOPAUSAL SYNDROME  POLYCYSTIC KIDNEY DISEASE  Hypokalemia  Fever  Dehydration  Normocytic anemia   Discharge Condition: Good  Diet recommendation: Regular  Filed Weights   05/08/12 0532 05/09/12 0534 05/10/12 0500  Weight: 62.642 kg (138 lb 1.6 oz) 60.873 kg (134 lb 3.2 oz) 60.3 kg (132 lb 15 oz)    History of present illness:  Alyssa Fernandez is a 71 y.o. female with past medical history of hypertension, polycystic kidney disease, anxiety, hypothyroidism, gastroesophageal reflux disease who had presented to interventional radiology on the day of admission for drain placement secondary to a hepatic abscess. Patient states that developed a fever of 102, right upper quadrant pain, right-sided bloating 7-10 days prior to admission which was progressively getting worse and a such subsequently presented to her PCPs office to be assessed. Patient states she was seen in the PCPs office were CT of  the abdomen and pelvis were done which was concerning for hepatic abscess. Patient stated that her PCPs office called her at home 3 days prior to admission asking her to present to the ED for admission and further evaluation however patient stated that she refused as she was starting to feel better denied any emesis as stated her abdominal pain was improving. Patient was subsequently called and sent to interventional radiology for hepatic drain placement. Patient states prior to admission was being treated empirically at home with oral Cipro and Flagyl. Patient denies any shortness of breath, denies any headache, no chest pain, no nausea, no dysuria, patient does endorse a nonproductive cough, an episode of diarrhea, an episode of emesis after drinking oral contrast. Postprocedure patient was noted to spike a fever 102, have nausea and vomiting.   Hospital Course:  #1 acute hepatic abscess  Status post percutaneous drain. Anaerobic Cultures are pending-facial polymorphonuclear cells. WBC is trending down. Patient is currently afebrile. Zosyn changed to by mouth Flagyl and Cipro 9/18 per infectious disease specialist. When drainage less than 10 cc per day will likely require repeat CT of the abdomen and pelvis prior to drain removal. ID has been consulted and appreciate input and recommendations. Interventional radiology is following-a repeat CT scan was done 9/20 and I spoek with IR MD Dr. Archer Asa who has arranged repeat CT with potential drain removal 9/30 1:45-intructions given to patient  #2 fever  Likely secondary to problem #1. Blood cultures are negative so far from 05/06/12. UA is negative. Chest x-ray is negative. MOst likely etiology all 2/2 to Abcess-Abcess cultures neg x 3 days on day of d/c.  #3 gastroesophageal reflux disease  PPI.   #4 hypokalemia  Repleted-potassium stable   #5 dehydration  Gentle IV hydration-discontinue IV fluids 9/18 continue by mouth fluid-Tolerated meals well  and no issues  #6 hypertension  Stable. Continue Lopressor 100 mg twice a day and lisinopril 20 mg daily.   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 Grace Isaac 05/06/2012  Infectious disease Dr. Orvan Falconer 05/07/2012 Procedures:  Placement of 10 French drain into hepatic abscess within the posterior segment of the right lobe of the liver per interventional radiology 05/06/2012 Antibiotics:  IV Zosyn 05/06/2012 >9/18  Cipro and Flagyl 9/18 >>>05/29/12  Discharge Exam: Filed Vitals:   05/09/12 2117 05/10/12 0500 05/10/12 0554 05/10/12 1301  BP: 132/71  130/86 119/64  Pulse: 63  84 55  Temp: 98.5 F (36.9 C)  98.6 F (37 C) 98 F (36.7 C)  TempSrc: Oral  Oral Oral  Resp: 17  18 16   Height:      Weight:  60.3 kg (132 lb 15 oz)    SpO2: 96%  95% 90%   General: NAD  Cardiovascular: RRR  Respiratory: CTAB  Abdomen: SOFT/ Decreasing TTP in RUQ, ND, +BS. Right-sided percutaneous drain-minimal drainage noted  Discharge Instructions  Follow-up Appointments: Discharge Orders    Future Appointments: Provider: Department: Dept Phone: Center:   05/20/2012 2:00 PM [facility] [phone] [place]   05/21/2012 9:00 AM Corwin Levins, MD [facility] [phone]     Future Orders Please Complete By Expires   Diet - low sodium heart healthy      Increase activity slowly      Call MD for:  temperature >100.4      Call MD for:  redness, tenderness, or signs of infection (pain, swelling, redness, odor or green/yellow discharge around incision  site)      Call MD for:  difficulty breathing, headache or visual disturbances      Call MD for:  persistant dizziness or light-headedness         Discharge Medications:   Medication List     As of 05/10/2012  3:20 PM    STOP taking these medications         ciprofloxacin 500 MG tablet   Commonly known as: CIPRO      zolpidem 10 MG tablet   Commonly known as: AMBIEN      TAKE these medications         ALPRAZolam 0.5 MG tablet   Commonly known as: XANAX   Take 1 tablet (0.5 mg total) by mouth 3 (three) times daily as needed.      amLODipine 5 MG tablet   Commonly known as: NORVASC   Take 1 tablet (5 mg total) by mouth daily.      calcium carbonate 1250 MG tablet   Commonly known as: OS-CAL - dosed in mg of elemental calcium   Take 1 tablet by mouth daily.      citalopram 20 MG tablet   Commonly known as: CELEXA   TAKE ONE TABLET EACH DAY FOR DEPRESSION-MAY INCREASE TO 2 TABLETS DAILY  estrogens (conjugated) 0.625 MG tablet   Commonly known as: PREMARIN   Take 1 tablet (0.625 mg total) by mouth daily. Take daily for 21 days then do not take for 7 days.      furosemide 40 MG tablet   Commonly known as: LASIX   Take 1 tablet (40 mg total) by mouth daily.      HYDROcodone-acetaminophen 5-500 MG per tablet   Commonly known as: VICODIN   Take 1 tablet by mouth every 4 (four) hours as needed for pain. Max of 4 tablets per day.      levofloxacin 750 MG tablet   Commonly known as: LEVAQUIN   Take 1 tablet (750 mg total) by mouth daily.      lisinopril 20 MG tablet   Commonly known as: PRINIVIL,ZESTRIL   TAKE ONE TABLET EACH DAY FOR EDEMA AND  BLOOD PRESSURE AND KIDNEY PROTECTION      metoprolol 100 MG tablet   Commonly known as: LOPRESSOR   TAKE ONE TABLET TWICE DAILY      metroNIDAZOLE 500 MG tablet   Commonly known as: FLAGYL   Take 1 tablet (500 mg total) by mouth 3 (three) times daily.      metroNIDAZOLE 500 MG tablet   Commonly known as: FLAGYL    Take 1 tablet (500 mg total) by mouth every 8 (eight) hours.      omeprazole-sodium bicarbonate 40-1100 MG per capsule   Commonly known as: ZEGERID   TAKE ONE CAPSULE EACH DAY      potassium chloride 20 MEQ packet   Commonly known as: KLOR-CON   Take 20 mEq by mouth 2 (two) times daily.      PREMARIN vaginal cream   Generic drug: conjugated estrogens   INSERT 1/3 TO 1/2 APPLICATOR DAILY AT   BEDTIME      sertraline 100 MG tablet   Commonly known as: ZOLOFT   TAKE ONE TABLET TWICE DAILY      simvastatin 80 MG tablet   Commonly known as: ZOCOR   TAKE ONE-HALF TABLET AT BEDTIME      VITAMIN B 12 PO   Take by mouth.      vitamin C 100 MG tablet   Take 100 mg by mouth daily.      ZOVIRAX 5 %   Generic drug: acyclovir cream   APPLY TWICE DAILY          The results of significant diagnostics from this hospitalization (including imaging, microbiology, ancillary and laboratory) are listed below for reference.    Significant Diagnostic Studies: Dg Chest 2 View  05/01/2012  *RADIOLOGY REPORT*  Clinical Data: Chest pain  CHEST - 2 VIEW  Comparison: 11/10/2009  Findings: Normal heart size.  Lungs hyperaerated.  No consolidation or mass.  No pneumothorax.  Interstitial prominence and mild bronchitic changes are chronic.  IMPRESSION: No active cardiopulmonary disease.  Changes related COPD are noted.   Original Report Authenticated By: Donavan Burnet, M.D.    Ct Guided Abscess Drain  05/06/2012  *RADIOLOGY REPORT*  Indication: Infected hepatic cyst, now with hepatic abscess.  CT GUIDED HEPATIC ABSCESS DRAINAGE CATHETER PLACEMENT  Comparison: CT abdomen pelvis - 05/03/2012  Medications: Fentanyl 100 mcg IV; Versed 2 mg IV; Ciprofloxacin 400 mg IV  Total Moderate Sedation time: 42 minutes (procedure artificially lengthened secondary to the CT malfunctioning requiring rebooting).  Contrast: None  Complications: The patient developed post drain placement rigors and nausea which subsided with  the administration of fluid bolus  and the IV Zofran.  Technique / Findings:  Informed written consent was obtained from the patient after a discussion of the risks, benefits and alternatives to treatment. The patient was placed supine on the CT gantry and a pre procedural CT was performed re-demonstrating the known abscess/fluid collection within the medial aspect of the posterior segment of the right lobe the liver.  The procedure was planned.   A timeout was performed prior to the initiation of the procedure.  The lateral aspect of the right upper abdomen was prepped and draped in the usual sterile fashion.   The overlying soft tissues were anesthetized with 1% lidocaine with epinephrine.  A 5 [place] Yueh sheath need was advanced in to the abscess/fluid collection resulting in aspiration of a small amount of purulent fluid.  As such, a short Amplatz super stiff wire was coiled within the abscess/fluid collection.   Appropriate positioning was confirmed with a limited CT scan.  The tract was serially dilated allowing placement of a 10 [place] all-purpose drainage catheter. Appropriate positioning was confirmed with a limited postprocedural CT scan.  90 ml of purulent fluid was aspirated.  The tube was connected to a drainage bag and sutured in place.  A dressing was placed.  As stated above, despite preprocedural antibiotics, the patient developed post procedural rigors and nausea which resolved with fluid bolus and IV Zofran.  Impression:  Successful CT guided placement of a 10 [place] all purpose drain catheter into the abscess within the medial aspect the posterior segment right lobe of the liver with aspiration of 90 mL of purulent fluid.  Samples were sent to the laboratory as requested by the ordering clinical team.  Plan:  Given the development of postprocedural rigors, the patient will be admitted to the hospital for continued monitoring and the administration of intravenous antibiotics.   Original Report  Authenticated By: Waynard Reeds, M.D.    Ct Abdomen Pelvis W Contrast  05/03/2012  *RADIOLOGY REPORT*  Clinical Data: Right-sided abdominal and pelvic pain.  Fever. Polycystic kidney disease.  CT ABDOMEN AND PELVIS WITH CONTRAST  Technique:  Multidetector CT imaging of the abdomen and pelvis was performed following the standard protocol during bolus administration of intravenous contrast.  Contrast: OMNIPAQUE IOHEXOL 300 MG/ML  SOLN  Comparison: 11/12/2009  Findings: Numerous cysts are again seen involving both kidneys, consistent with autosomal dominant polycystic kidney disease.  No renal masses are identified there is no evidence of hydronephrosis.  Numerous cysts are also seen involving the liver.  One cystic lesion in the posterior right hepatic lobe shows enlargement, as well as rim enhancement and edema in the adjacent hepatic parenchyma.  This now measures 5.3 x 7.2 cm, and these findings raise suspicion for hepatic abscess.  No solid liver masses are identified.  The adrenal glands, pancreas, and spleen are normal appearance. Prior cholecystectomy noted.  Prominence of the bile ducts is stable.  Prior hysterectomy noted.  No soft tissue masses or lymphadenopathy seen within the abdomen or pelvis.  No other inflammatory process or intraperitoneal fluid collections identified.  No evidence of dilated bowel loops.  IMPRESSION:  1. Autosomal dominant polycystic kidney disease, with hepatic involvement again noted. 2.  Interval increase in 7 cm cystic lesion in the posterior right hepatic lobe which now shows rim enhancement and adjacent hepatic edema.  This is suspicious for hepatic abscess.  Consider percutaneous imaging guided needle aspiration and drainage.  These results will be called to the ordering clinician or representative  by the Radiologist Assistant, and communication documented in the PACS Dashboard.   Original Report Authenticated By: Danae Orleans, M.D.    Ct Abd Limited  W/cm  05/10/2012  *RADIOLOGY REPORT*  Clinical Data:  Follow up hepatic abscess drainage.  CT ABDOMEN WITH CONTRAST  Technique:  Multidetector CT imaging of the abdomen was performed using the standard protocol following bolus administration of intravenous contrast.  Contrast: OMNIPAQUE IOHEXOL 300 MG/ML. Oral contrast was also administered.  Comparison:  CT images at the time of hepatic abscess drainage 05/06/2012.  CT abdomen 05/03/2012.  Findings:  Interval decrease in size of the hepatic abscess post catheter drainage, the fluid collection currently measuring approximately 4.2 x 3.5 cm (image 11), previously 7.2 x 5.3 cm. Abscess catheter appropriately positioned within the abscess.  Stable innumerable hepatic and renal cysts.  Stable small hiatal hernia.  No new abnormalities in the abdomen.  Interval development of a small right pleural effusion and associated consolidation in the right lower lobe.  Visualized left lung base clear.  Bone window images again demonstrate degenerative changes involving the lower thoracic and lumbar spine, with a chronic disc extrusion at L1-2, containing calcification in the posterior annular fibers.  IMPRESSION:  1.  Reduction in size of the hepatic abscess post catheter drainage, measurements given above.  As there is still fluid within the collection, catheter flushing should be continued. 2.  No new abnormalities in the abdomen since the examination 1 week ago. 3.  Interval small right pleural effusion and associated dense passive atelectasis or pneumonia in the right lower lobe. 4.  Stable small hiatal hernia. 5.  Polycystic kidney disease with innumerable hepatic and renal cysts.   Original Report Authenticated By: Arnell Sieving, M.D.    Dg Chest Port 1 View  05/06/2012  *RADIOLOGY REPORT*  Clinical Data: Fever.  Rule out pneumonia  PORTABLE CHEST - 1 VIEW  Comparison: 05/01/2012  Findings: Heart size is upper normal.  Negative for heart failure. Lungs are  clear without infiltrate or effusion.  Negative for pneumonia. Pigtail catheter in the liver.  IMPRESSION: No acute abnormality.   Original Report Authenticated By: Camelia Phenes, M.D.     Microbiology: Recent Results (from the past 240 hour(s))  CULTURE, ROUTINE-ABSCESS     Status: Normal   Collection Time   05/06/12  2:03 PM      Component Value Range Status Comment   Specimen Description DRAINAGE HEPATIC CYST   Final    Special Requests Normal   Final    Gram Stain     Final    Value: ABUNDANT WBC PRESENT,BOTH PMN AND MONONUCLEAR     NO SQUAMOUS EPITHELIAL CELLS SEEN     NO ORGANISMS SEEN   Culture NO GROWTH 3 DAYS   Final    Report Status 05/10/2012 FINAL   Final   ANAEROBIC CULTURE     Status: Normal (Preliminary result)   Collection Time   05/06/12  2:03 PM      Component Value Range Status Comment   Specimen Description DRAINAGE HEPATIC CYST   Final    Special Requests Normal   Final    Gram Stain     Final    Value: ABUNDANT WBC PRESENT,BOTH PMN AND MONONUCLEAR     NO SQUAMOUS EPITHELIAL CELLS SEEN     NO ORGANISMS SEEN   Culture     Final    Value: NO ANAEROBES ISOLATED; CULTURE IN PROGRESS FOR 5 DAYS   Report  Status PENDING   Incomplete   CULTURE, BLOOD (ROUTINE X 2)     Status: Normal (Preliminary result)   Collection Time   05/06/12  6:10 PM      Component Value Range Status Comment   Specimen Description BLOOD RIGHT HAND   Final    Special Requests BOTTLES DRAWN AEROBIC AND ANAEROBIC 10CC   Final    Culture  Setup Time 05/06/2012 23:01   Final    Culture     Final    Value:        BLOOD CULTURE RECEIVED NO GROWTH TO DATE CULTURE WILL BE HELD FOR 5 DAYS BEFORE ISSUING A FINAL NEGATIVE REPORT   Report Status PENDING   Incomplete   CULTURE, BLOOD (ROUTINE X 2)     Status: Normal (Preliminary result)   Collection Time   05/06/12  6:15 PM      Component Value Range Status Comment   Specimen Description BLOOD RIGHT ARM   Final    Special Requests BOTTLES DRAWN AEROBIC  AND ANAEROBIC 10CC   Final    Culture  Setup Time 05/06/2012 23:02   Final    Culture     Final    Value:        BLOOD CULTURE RECEIVED NO GROWTH TO DATE CULTURE WILL BE HELD FOR 5 DAYS BEFORE ISSUING A FINAL NEGATIVE REPORT   Report Status PENDING   Incomplete   URINE CULTURE     Status: Normal   Collection Time   05/06/12  9:04 PM      Component Value Range Status Comment   Specimen Description URINE, CLEAN CATCH   Final    Special Requests NONE   Final    Culture  Setup Time 05/07/2012 02:19   Final    Colony Count NO GROWTH   Final    Culture NO GROWTH   Final    Report Status 05/07/2012 FINAL   Final      Labs: Basic Metabolic Panel:  Lab 05/08/12 1610 05/07/12 0353 05/06/12 1923 05/06/12 1810 05/06/12 1134  NA 133* 133* -- 133* 132*  K 4.1 4.1 -- 2.9* 2.3*  CL 100 100 -- 93* 90*  CO2 24 27 -- 30 31  GLUCOSE 90 103* -- 115* 99  BUN 6 9 -- 11 11  CREATININE 0.91 0.95 -- 0.97 0.94  CALCIUM 8.1* 8.3* -- 8.3* 8.5  MG -- 2.1 -- 1.8 --  PHOS -- -- 2.7 -- --   Liver Function Tests:  Lab 05/07/12 0353 05/06/12 1810  AST 12 14  ALT 5 <5  ALKPHOS 61 65  BILITOT 0.4 0.4  PROT 5.6* 6.1  ALBUMIN 2.3* 2.5*   No results found for this basename: LIPASE:5,AMYLASE:5 in the last 168 hours No results found for this basename: AMMONIA:5 in the last 168 hours CBC:  Lab 05/09/12 0405 05/08/12 0423 05/07/12 0353 05/06/12 1810 05/06/12 1107  WBC 6.2 10.3 13.7* 20.3* 10.8*  NEUTROABS 4.6 8.1* 12.0* 19.0* --  HGB 10.0* 10.4* 9.8* 10.5* 11.1*  HCT 30.2* 31.9* 30.5* 31.6* 32.9*  MCV 89.9 90.9 90.8 88.8 88.0  PLT 508* 605* 510* 585* 627*   Cardiac Enzymes: No results found for this basename: CKTOTAL:5,CKMB:5,CKMBINDEX:5,TROPONINI:5 in the last 168 hours BNP: BNP (last 3 results) No results found for this basename: PROBNP:3 in the last 8760 hours CBG: No results found for this basename: GLUCAP:5 in the last 168 hours  Time coordinating discharge: 45nutes  Signed:  Rhetta Mura  [facility] 05/10/2012, 1:14 PM

## 2012-05-10 NOTE — Progress Notes (Signed)
Talked to patient about DCP; patient lives at home with spouse; has insurance - Medicare part A&B and [employer], patient also stated that she does not have any problems getting her medications. No needs identified at this time. Abelino Derrick RN,BSN,MHA

## 2012-05-10 NOTE — Progress Notes (Signed)
Physical Therapy Treatment Patient Details Name: Alyssa Fernandez MRN: [id number] DOB: [birth year] Today's Date: 05/10/2012 Time: 0902-0915 PT Time Calculation (min): 13 min  PT Assessment / Plan / Recommendation Comments on Treatment Session  Pt feeling better every day.  In good spirits but demon some anxiety about going home.    Follow Up Recommendations  No PT follow up    Barriers to Discharge        Equipment Recommendations  None recommended by PT    Recommendations for Other Services    Frequency Min 3X/week   Plan Discharge plan remains appropriate    Precautions / Restrictions     Pertinent Vitals/Pain No c/o pain    Mobility  Bed Mobility Bed Mobility: Supine to Sit;Sit to Supine Supine to Sit: 5: Supervision Sit to Supine: 5: Supervision Details for Bed Mobility Assistance: only requires increased time  Transfers Transfers: Sit to Stand;Stand to Sit Sit to Stand: From bed;From chair/3-in-1;4: Min guard Stand to Sit: To bed;To chair/3-in-1;4: Min guard Details for Transfer Assistance: increased time  Ambulation/Gait Ambulation/Gait Assistance: 4: Min guard Ambulation Distance (Feet): 115 Feet (55', 65') Assistive device: None Ambulation/Gait Assistance Details: Brought her a SPC and pt stated, "I don't need that", however pt tends to support self holding furniture/wall/rail.  Mild unsteady, drunken gait. Gait Pattern: Step-through pattern;Decreased stride length Gait velocity: decreased    PT Goals                                        progressing    Visit Information  Last PT Received On: 05/10/12 Assistance Needed: +1                   End of Session PT - End of Session Equipment Utilized During Treatment: Gait belt Activity Tolerance: Patient tolerated treatment well Patient left: with call bell/phone within reach  Felecia Shelling  PTA [facility]  Acute  Rehab Pager     [phone]

## 2012-05-11 LAB — ANAEROBIC CULTURE

## 2012-05-12 LAB — CULTURE, BLOOD (ROUTINE X 2)

## 2012-05-20 ENCOUNTER — Ambulatory Visit (HOSPITAL_COMMUNITY)
Admit: 2012-05-20 | Discharge: 2012-05-20 | Disposition: A | Discharge status: Home / Self Care | Payer: Medicare Other | Attending: Family Medicine | Admitting: Family Medicine

## 2012-05-20 DIAGNOSIS — K750 Abscess of liver: Secondary | ICD-10-CM | POA: Insufficient documentation

## 2012-05-20 MED ORDER — IOHEXOL 300 MG/ML  SOLN
100.0000 mL | Freq: Once | INTRAMUSCULAR | Status: AC | PRN
  Administered 2012-05-20: 100 mL via INTRAVENOUS

## 2012-05-21 ENCOUNTER — Encounter: Payer: Self-pay | Admitting: Internal Medicine

## 2012-05-21 ENCOUNTER — Other Ambulatory Visit: Payer: Self-pay

## 2012-05-21 ENCOUNTER — Ambulatory Visit (INDEPENDENT_AMBULATORY_CARE_PROVIDER_SITE_OTHER): Payer: Medicare Other | Admitting: Internal Medicine

## 2012-05-21 VITALS — BP 120/78 | HR 62 | Temp 98.3°F | Ht 63.5 in | Wt 131.4 lb

## 2012-05-21 DIAGNOSIS — M899 Disorder of bone, unspecified: Secondary | ICD-10-CM

## 2012-05-21 DIAGNOSIS — Z23 Encounter for immunization: Secondary | ICD-10-CM

## 2012-05-21 DIAGNOSIS — I10 Essential (primary) hypertension: Secondary | ICD-10-CM

## 2012-05-21 DIAGNOSIS — R609 Edema, unspecified: Secondary | ICD-10-CM

## 2012-05-21 DIAGNOSIS — K750 Abscess of liver: Secondary | ICD-10-CM

## 2012-05-21 MED ORDER — HYDROCODONE-ACETAMINOPHEN 5-500 MG PO TABS: 1.0000 | ORAL_TABLET | ORAL | Status: DC | PRN

## 2012-05-21 NOTE — Assessment & Plan Note (Signed)
stable overall by hx and exam, most recent data reviewed with pt, and pt to continue medical treatment as before Lab Results  Component Value Date   WBC 6.2 05/09/2012   HGB 10.0* 05/09/2012   HCT 30.2* 05/09/2012   PLT 508* 05/09/2012   GLUCOSE 90 05/08/2012   CHOL 153 11/20/2011   TRIG 110.0 11/20/2011   HDL 54.00 11/20/2011   LDLCALC 77 11/20/2011   ALT 5 05/07/2012   AST 12 05/07/2012   NA 133* 05/08/2012   K 4.1 05/08/2012   CL 100 05/08/2012   CREATININE 0.91 05/08/2012   BUN 6 05/08/2012   CO2 24 05/08/2012   TSH 0.98 11/20/2011   INR 1.21 05/06/2012   HGBA1C 5.9 05/19/2008

## 2012-05-21 NOTE — Telephone Encounter (Signed)
Done hardcopy to [facility]bin  

## 2012-05-21 NOTE — Progress Notes (Signed)
Subjective:    Patient ID: Alyssa Fernandez, female    DOB: 10-30-1940, 71 y.o.   MRN: [id number]  HPI  Here to f/u recent liver abscess unclear etiology,  Course complicated by hypokalemia, fever, and normocytic anemia, now afeb, last hgb 10.0 sept 18, and K 4.1.  Has cont'd hepatic drain, and flushes 3 times per wk per home nurse.  Pt can do it if needed, and daughter is nurse tech who does home care as well.  Since d/c sept 20, no further fever on levaquin 750 qd, and flagyl 500 tid with plan for flagyl for at least 30 days,has appt with ID oct 9 with Dr Daiva Eves.  Pt states Had CT  Sept 30 (yesterday) with decreased size abscess, . Appears to have had a limited study I think for IR purpose.  Denies abd pain, worsening fever, not yet driving but plans to take the train to [facility].  No new complaints, Denies worsening reflux, dysphagia, abd pain, n/v, bowel change or blood.    Due for DXA as it has been more than 25 mo.  Due for flu shot Past Medical History  Diagnosis Date  . Hyperlipidemia   . Hypertension   . Polycystic kidney disease   . Anxiety   . RENAL FAILURE   . 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 HYPOTHYROIDISM   . GERD   . VITAMIN D DEFICIENCY   . OSTEOPENIA   . INSOMNIA   . Cervical spine degeneration     Severe by CT April 2012   Past Surgical History  Procedure Date  . Partial nephrectomy   . Abdominal hysterectomy   . Cervical disc surgery   . Bunionectomy   . Fx rt ankle   . Left temporal bx artery-negative 2    . Bladder operation, but per sling procedure   . 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 Percutaneous drainage of liver abscess 05/06/2012    reports that she has never smoked. She has never used smokeless tobacco. She reports that she does not drink alcohol or use illicit drugs. family history includes Stroke in her mother. Allergies  Allergen Reactions  . Codeine   . Sulfonamide Derivatives    Current Outpatient Prescriptions on File Prior  to Visit  Medication Sig Dispense Refill  . ALPRAZolam (XANAX) 0.5 MG tablet Take 1 tablet (0.5 mg total) by mouth 3 (three) times daily as needed.  90 tablet  5  . amLODipine (NORVASC) 5 MG tablet Take 1 tablet (5 mg total) by mouth daily.  90 tablet  3  . Ascorbic Acid (VITAMIN C) 100 MG tablet Take 100 mg by mouth daily.        . calcium carbonate (OS-CAL) 1250 MG tablet Take 1 tablet by mouth daily.        . citalopram (CELEXA) 20 MG tablet TAKE ONE TABLET EACH DAY FOR DEPRESSION-MAY INCREASE TO 2 TABLETS DAILY  30 tablet  5  . Cyanocobalamin (VITAMIN B 12 PO) Take by mouth.        . estrogens, conjugated, (PREMARIN) 0.625 MG tablet Take 1 tablet (0.625 mg total) by mouth daily. Take daily for 21 days then do not take for 7 days.  30 tablet  11  . furosemide (LASIX) 40 MG tablet Take 1 tablet (40 mg total) by mouth daily.  90 tablet  3  . HYDROcodone-acetaminophen (VICODIN) 5-500 MG per tablet Take 1 tablet by mouth every 4 (four) hours  as needed for pain. Max of 4 tablets per day.  120 tablet  5  . levofloxacin (LEVAQUIN) 750 MG tablet Take 1 tablet (750 mg total) by mouth daily.  18 tablet  0  . lisinopril (PRINIVIL,ZESTRIL) 20 MG tablet TAKE ONE TABLET EACH DAY FOR EDEMA AND  BLOOD PRESSURE AND KIDNEY PROTECTION  30 tablet  5  . metoprolol (LOPRESSOR) 100 MG tablet TAKE ONE TABLET TWICE DAILY  60 tablet  11  . metroNIDAZOLE (FLAGYL) 500 MG tablet Take 1 tablet (500 mg total) by mouth every 8 (eight) hours.  54 tablet  0  . omeprazole-sodium bicarbonate (ZEGERID) 40-1100 MG per capsule TAKE ONE CAPSULE EACH DAY  30 capsule  11  . potassium chloride (KLOR-CON) 20 MEQ packet Take 20 mEq by mouth 2 (two) times daily.  60 tablet  11  . PREMARIN vaginal cream INSERT 1/3 TO 1/2 APPLICATOR DAILY AT   BEDTIME  30 g  6  . sertraline (ZOLOFT) 100 MG tablet TAKE ONE TABLET TWICE DAILY  60 tablet  11  . simvastatin (ZOCOR) 80 MG tablet TAKE ONE-HALF TABLET AT BEDTIME  30 tablet  11  . ZOVIRAX 5 % cream  APPLY TWICE DAILY  5 g  5   Current Facility-Administered Medications on File Prior to Visit  Medication Dose Route Frequency Provider Last Rate Last Dose  . iohexol (OMNIPAQUE) 300 MG/ML solution 100 mL  100 mL Intravenous Once PRN Medication Radiologist, MD   100 mL at 05/20/12 1522   Review of Systems  Constitutional: Negative for diaphoresis and unexpected weight change.  HENT: Negative for tinnitus.   Eyes: Negative for photophobia and visual disturbance.  Respiratory: Negative for choking and stridor.   Gastrointestinal: Negative for vomiting and blood in stool.  Genitourinary: Negative for hematuria and decreased urine volume.  Musculoskeletal: Negative for gait problem.  Skin: Negative for color change Neurological: Negative for tremors and numbness.  Psychiatric/Behavioral: Negative for decreased concentration. The patient is not hyperactive.       Objective:   Physical Exam BP 120/78  Pulse 62  Temp 98.3 F (36.8 C) (Oral)  Ht 5' 3.5" (1.613 m)  Wt 131 lb 6 oz (59.591 kg)  BMI 22.91 kg/m2  SpO2 96% Physical Exam  VS noted, not ill appearing Constitutional: Pt appears well-developed and well-nourished.  HENT: Head: Normocephalic.  Right Ear: External ear normal.  Left Ear: External ear normal.  Eyes: Conjunctivae and EOM are normal. Pupils are equal, round, and reactive to light.  Neck: Normal range of motion. Neck supple.  Cardiovascular: Normal rate and regular rhythm.   Pulmonary/Chest: Effort normal and breath sounds normal.  Abd:  Soft, NT, non-distended, + BS, drain to RUQ intact Neurological: Pt is alert. Not confused  Skin: Skin is warm. No erythema.  Psychiatric: Pt behavior is normal. Thought content normal.     Assessment & Plan:

## 2012-05-21 NOTE — Assessment & Plan Note (Signed)
Cont's to improve on current tx, no new issue id'd today, for ID f/u oct as planned, may need another f/u CT after that visit

## 2012-05-21 NOTE — Assessment & Plan Note (Signed)
Due for dxa - for f/u order

## 2012-05-21 NOTE — Assessment & Plan Note (Signed)
stable overall by hx and exam, most recent data reviewed with pt, and pt to continue medical treatment as before BP Readings from Last 3 Encounters:  05/21/12 120/78  05/10/12 119/64  05/01/12 130/80

## 2012-05-21 NOTE — Patient Instructions (Signed)
You had the flu shot today Continue all other medications as before Please keep your appointments with your specialists as you have planned - Infectious Disease You may another CT for after that appointment Please schedule a Bone Density test with the "check-out" desk as you leave today Please return in 6 months, or sooner if needed

## 2012-05-21 NOTE — Addendum Note (Signed)
Addended by: Scharlene Gloss B on: 05/21/2012 10:20 AM   Modules accepted: Orders

## 2012-05-22 NOTE — Telephone Encounter (Signed)
Faxed hardcopy to pharmacy. 

## 2012-05-29 ENCOUNTER — Ambulatory Visit (INDEPENDENT_AMBULATORY_CARE_PROVIDER_SITE_OTHER): Payer: Medicare Other | Admitting: Infectious Disease

## 2012-05-29 ENCOUNTER — Encounter: Payer: Self-pay | Admitting: Infectious Disease

## 2012-05-29 VITALS — BP 138/85 | HR 82 | Ht 63.0 in | Wt 132.0 lb

## 2012-05-29 DIAGNOSIS — K750 Abscess of liver: Secondary | ICD-10-CM

## 2012-05-29 MED ORDER — LEVOFLOXACIN 750 MG PO TABS: 750.0000 mg | ORAL_TABLET | Freq: Every day | ORAL | Status: AC

## 2012-05-29 MED ORDER — METRONIDAZOLE 500 MG PO TABS: 500.0000 mg | ORAL_TABLET | Freq: Three times a day (TID) | ORAL | Status: DC

## 2012-05-29 NOTE — Assessment & Plan Note (Signed)
Check repeat CT of the abdomen and pelvis. I re ordered sufficietn abx to extend therapy if needed. RTC in one month

## 2012-05-29 NOTE — Progress Notes (Signed)
  Subjective:    Patient ID: Alyssa Fernandez, female    DOB: 1940/11/01, 71 y.o.   MRN: [id number]  HPI  71 y.o. female with polycystic disease then developed extreme malaise, anorexia and abdominal bloating. She began to have some right upper quadrant pain and fever and saw Dr. Gery Pray on May 01, 2012. She was started on empiric ciprofloxacin and metronidazole and a CAT scan was ordered which showed enlargement of one of her hepatic cyst worrisome for infection. She was admitted  after having a percutaneous drain placed. Right after the procedure she had a hard shaking chill and spiked a fever to over 102.  About 90 cc of pus were aspirated and the drain was left in. Her antibiotic therapy was changed to piperacillin tazobactam pending cultures. Gram stain of abscess fluid did not reveal any organisms and cultures were no growth. My partner Dr. Orvan Falconer saw her and changed her to levaquin and flagyl. She has had reduction in output to only a few CC EVEN with flushes. No fevers, chills nausea or malaise.   Review of Systems  Constitutional: Negative for fever, chills, diaphoresis, activity change, appetite change, fatigue and unexpected weight change.  HENT: Negative for congestion, sore throat, rhinorrhea, sneezing, trouble swallowing and sinus pressure.   Eyes: Negative for photophobia and visual disturbance.  Respiratory: Negative for cough, chest tightness, shortness of breath, wheezing and stridor.   Cardiovascular: Negative for chest pain, palpitations and leg swelling.  Gastrointestinal: Negative for nausea, vomiting, abdominal pain, diarrhea, constipation, blood in stool, abdominal distention and anal bleeding.  Genitourinary: Negative for dysuria, hematuria, flank pain and difficulty urinating.  Musculoskeletal: Negative for myalgias, back pain, joint swelling, arthralgias and gait problem.  Skin: Negative for color change, pallor, rash and wound.  Neurological: Negative for  dizziness, tremors, weakness and light-headedness.  Hematological: Negative for adenopathy. Does not bruise/bleed easily.  Psychiatric/Behavioral: Negative for behavioral problems, confusion, disturbed wake/sleep cycle, dysphoric mood, decreased concentration and agitation.       Objective:   Physical Exam  Constitutional: She is oriented to person, place, and time. She appears well-developed and well-nourished. No distress.  HENT:  Head: Normocephalic and atraumatic.  Mouth/Throat: Oropharynx is clear and moist. No oropharyngeal exudate.  Eyes: Conjunctivae normal and EOM are normal. Pupils are equal, round, and reactive to light. No scleral icterus.  Neck: Normal range of motion. Neck supple. No JVD present.  Cardiovascular: Normal rate, regular rhythm and normal heart sounds.  Exam reveals no gallop and no friction rub.   No murmur heard. Pulmonary/Chest: Effort normal and breath sounds normal. No respiratory distress. She has no wheezes. She has no rales. She exhibits no tenderness.  Abdominal: She exhibits no distension and no mass. There is tenderness. There is no rebound and no guarding.    Musculoskeletal: She exhibits no edema and no tenderness.  Lymphadenopathy:    She has no cervical adenopathy.  Neurological: She is alert and oriented to person, place, and time. She has normal reflexes. She exhibits normal muscle tone. Coordination normal.  Skin: Skin is warm and dry. She is not diaphoretic. No erythema. No pallor.  Psychiatric: She has a normal mood and affect. Her behavior is normal. Judgment and thought content normal.          Assessment & Plan:  Hepatic abscess Check repeat CT of the abdomen and pelvis. I re ordered sufficietn abx to extend therapy if needed. RTC in one month

## 2012-05-30 ENCOUNTER — Ambulatory Visit (INDEPENDENT_AMBULATORY_CARE_PROVIDER_SITE_OTHER)
Admission: RE | Admit: 2012-05-30 | Discharge: 2012-05-30 | Disposition: A | Discharge status: Home / Self Care | Payer: Medicare Other | Source: Ambulatory Visit

## 2012-05-30 DIAGNOSIS — M949 Disorder of cartilage, unspecified: Secondary | ICD-10-CM

## 2012-05-30 DIAGNOSIS — M899 Disorder of bone, unspecified: Secondary | ICD-10-CM

## 2012-05-31 ENCOUNTER — Other Ambulatory Visit: Payer: Self-pay | Admitting: Infectious Disease

## 2012-05-31 ENCOUNTER — Ambulatory Visit (HOSPITAL_COMMUNITY)
Admission: RE | Admit: 2012-05-31 | Discharge: 2012-05-31 | Disposition: A | Discharge status: Home / Self Care | Payer: Medicare Other | Source: Ambulatory Visit | Attending: Infectious Disease | Admitting: Infectious Disease

## 2012-05-31 DIAGNOSIS — K750 Abscess of liver: Secondary | ICD-10-CM

## 2012-05-31 DIAGNOSIS — Q613 Polycystic kidney, unspecified: Secondary | ICD-10-CM | POA: Insufficient documentation

## 2012-05-31 MED ORDER — IOHEXOL 300 MG/ML  SOLN
100.0000 mL | Freq: Once | INTRAMUSCULAR | Status: AC | PRN
  Administered 2012-05-31: 100 mL via INTRAVENOUS

## 2012-05-31 NOTE — Progress Notes (Signed)
Pt here for follow up CT for hepatic abscess She is about 1 month out from drain placement She has had minimal output and it looks like mostly serous fluid  CT today shows almost complete resolution of collection Drain was flushed and aspirated in CT and then subsequently removed without difficulty after approval of IR MD.  Brayton El PA-C 05/31/2012 9:43 AM

## 2012-06-06 ENCOUNTER — Encounter: Payer: Self-pay | Admitting: Internal Medicine

## 2012-06-11 DIAGNOSIS — K750 Abscess of liver: Secondary | ICD-10-CM

## 2012-06-11 DIAGNOSIS — E039 Hypothyroidism, unspecified: Secondary | ICD-10-CM

## 2012-06-11 DIAGNOSIS — K219 Gastro-esophageal reflux disease without esophagitis: Secondary | ICD-10-CM

## 2012-06-11 DIAGNOSIS — I10 Essential (primary) hypertension: Secondary | ICD-10-CM

## 2012-06-14 ENCOUNTER — Telehealth: Payer: Self-pay | Admitting: *Deleted

## 2012-06-14 NOTE — Telephone Encounter (Signed)
Patient came by the clinic today and advised she is always feeling sick to her stomach and sometimes throws up. She reports she is very weak and does not want to get out of the bed. She has been feeling very depressed and crying a lot. She wants to know when she can stop the medication. Advised her will send her doctor a note and inform him of this and see what he wants her to do I also offered her an earlier appt. She is coming in 06/19/12 at 845 am.

## 2012-06-17 NOTE — Telephone Encounter (Signed)
We can change her to a different antibiotic. Her last CT showed persistence of liver abscess though improved. I can try  To craft a regimen that is less irritating to her

## 2012-06-19 ENCOUNTER — Ambulatory Visit (INDEPENDENT_AMBULATORY_CARE_PROVIDER_SITE_OTHER): Payer: Medicare Other | Admitting: Infectious Disease

## 2012-06-19 ENCOUNTER — Encounter: Payer: Self-pay | Admitting: Infectious Disease

## 2012-06-19 VITALS — BP 86/53 | HR 62 | Temp 97.8°F | Wt 122.0 lb

## 2012-06-19 DIAGNOSIS — I959 Hypotension, unspecified: Secondary | ICD-10-CM

## 2012-06-19 DIAGNOSIS — I10 Essential (primary) hypertension: Secondary | ICD-10-CM

## 2012-06-19 DIAGNOSIS — F329 Major depressive disorder, single episode, unspecified: Secondary | ICD-10-CM

## 2012-06-19 DIAGNOSIS — R5381 Other malaise: Secondary | ICD-10-CM

## 2012-06-19 DIAGNOSIS — F341 Dysthymic disorder: Secondary | ICD-10-CM

## 2012-06-19 DIAGNOSIS — K750 Abscess of liver: Secondary | ICD-10-CM

## 2012-06-19 DIAGNOSIS — R5383 Other fatigue: Secondary | ICD-10-CM

## 2012-06-19 HISTORY — DX: Abscess of liver: K75.0

## 2012-06-19 HISTORY — DX: Hypotension, unspecified: I95.9

## 2012-06-19 NOTE — Patient Instructions (Signed)
WE WILL CHECK SOME STAT LABS TODAY  IF THEY ARE VERY WORRISOME I MAY NEED TO BRING YOU BACK FOR ADMISSION TO THE HOSPITAL  STOP YOUR LEVOFLOXACIN AND FLAGYL  STOP YOUR BLOOD PRESSURE MEDICINES INCLUDING FUROSEMIDE, LISINOPRIL AND AMLODIPINE  RECHECK YOUR BLOOD PRESSURE AT HOME NEXT FEW DAYS AND THEN CALL DR. Jonny Ruiz TO ASK HIM WHICH ONE YOU SHOULD RESTART FIRST  YOU ALSO NEED AN APPT WITH DR. Jonny Ruiz WITHIN THE NEXT WEEK  WE WILL ARRANGE FOR [place] OF YOUR LIVER

## 2012-06-19 NOTE — Progress Notes (Signed)
Subjective:    Patient ID: Alyssa Fernandez, female    DOB: Dec 23, 1940, 71 y.o.   MRN: [id number]  HPI  71 y.o. female with polycystic disease then developed extreme malaise, anorexia and abdominal bloating. She began to have some right upper quadrant pain and fever and saw Dr. Gery Pray on May 01, 2012. She was started on empiric ciprofloxacin and metronidazole and a CAT scan was ordered which showed enlargement of one of her hepatic cyst worrisome for infection. She was admitted after having a percutaneous drain placed. Right after the procedure she had a hard shaking chill and spiked a fever to over 102. About 90 cc of pus were aspirated and the drain was left in. Her antibiotic therapy was changed to piperacillin tazobactam pending cultures. Gram stain of abscess fluid did not reveal any organisms and cultures were no growth. My partner Dr. Orvan Falconer saw her and changed her to levaquin and flagyl. She has had reduction in output to only a few CC EVEN with flushes. Repeat CT scan in Oct showed:  small abscess at the catheter tip measuring 25 x 19 mm  which is decreased from 34 x 24 mm on prior  IR removed her pigtail catheter at that time. She continued on levaquin and flagyl since then but then began to c/o poor appetite, malaise and severe anxiety and depression--all of which she believes are due to her antibiotics. She has been running low and she has been continuing to take her norvasc, lasix and ACEI.  I checked orthostatics in clnic which were negative.   I proposed stopping her abx now, recheking US liver. I also would like her to stop her antiHTNsives. We will check stat labs and admit if necessary. I spent greater than 45 minutes with the patient including greater than 50% of time in face to face counsel of the patient and in coordination of their care.    Review of Systems  Constitutional: Positive for activity change, appetite change and fatigue. Negative for fever,  chills, diaphoresis and unexpected weight change.  HENT: Negative for congestion, sore throat, rhinorrhea, sneezing, trouble swallowing and sinus pressure.   Eyes: Negative for photophobia and visual disturbance.  Respiratory: Negative for cough, chest tightness, shortness of breath, wheezing and stridor.   Cardiovascular: Negative for chest pain, palpitations and leg swelling.  Gastrointestinal: Negative for nausea, vomiting, abdominal pain, diarrhea, constipation, blood in stool, abdominal distention and anal bleeding.  Genitourinary: Negative for dysuria, hematuria, flank pain and difficulty urinating.  Musculoskeletal: Negative for myalgias, back pain, joint swelling, arthralgias and gait problem.  Skin: Negative for color change, pallor, rash and wound.  Neurological: Positive for headaches. Negative for dizziness, tremors, weakness and light-headedness.  Hematological: Negative for adenopathy. Does not bruise/bleed easily.  Psychiatric/Behavioral: Positive for dysphoric mood. Negative for behavioral problems, confusion, disturbed wake/sleep cycle, decreased concentration and agitation. The patient is nervous/anxious.        Objective:   Physical Exam  Constitutional: She is oriented to person, place, and time. No distress.  HENT:  Head: Normocephalic and atraumatic.  Eyes: Conjunctivae normal and EOM are normal.  Neck: Normal range of motion. Neck supple. No JVD present.  Cardiovascular: Normal rate, regular rhythm and normal heart sounds.  Exam reveals no friction rub.   No murmur heard. Pulmonary/Chest: Effort normal and breath sounds normal. No respiratory distress. She has no wheezes. She has no rales. She exhibits no tenderness.  Abdominal: She exhibits no distension and no mass. There is tenderness.  There is no rebound and no guarding.    Musculoskeletal: She exhibits no edema and no tenderness.  Neurological: She is alert and oriented to person, place, and time. She exhibits  normal muscle tone. Coordination normal.  Skin: Skin is warm and dry. She is not diaphoretic.  Psychiatric: Judgment and thought content normal. Her mood appears anxious. She exhibits a depressed mood.          Assessment & Plan:   Liver abscess: radiographically is nearly resolved. Will stop her abx, check [place], follow clinically  Malaise, poor appetite: could be secondary to abx, esp flagyl with re to appetite, will stop abx, her bp is also low. Check stat labs and hold anti-HTNsives  Low blood pressure: orthostatics negative. Check stat bmp, cbc, lactic acid today, hold anti HTNSIves, She needs to monitor bp at home and fu with Dr. Jonny Ruiz  Depression: ? Fluoroquinolone effects, can cause delirium. Could also be secondary to depressed appetite and malaise

## 2012-06-21 ENCOUNTER — Other Ambulatory Visit: Payer: Self-pay | Admitting: Licensed Clinical Social Worker

## 2012-06-21 ENCOUNTER — Other Ambulatory Visit: Payer: Medicare Other

## 2012-06-21 DIAGNOSIS — K750 Abscess of liver: Secondary | ICD-10-CM

## 2012-06-21 LAB — CBC WITH DIFFERENTIAL/PLATELET
Basophils Absolute: 0 10*3/uL (ref 0.0–0.1)
HCT: 37.3 % (ref 36.0–46.0)
Hemoglobin: 12.8 g/dL (ref 12.0–15.0)
Lymphocytes Relative: 25 % (ref 12–46)
Monocytes Absolute: 0.5 10*3/uL (ref 0.1–1.0)
Neutro Abs: 2.3 10*3/uL (ref 1.7–7.7)
RDW: 13.9 % (ref 11.5–15.5)
WBC: 3.8 10*3/uL — ABNORMAL LOW (ref 4.0–10.5)

## 2012-06-21 LAB — BASIC METABOLIC PANEL WITH GFR
BUN: 9 mg/dL (ref 6–23)
Chloride: 94 mEq/L — ABNORMAL LOW (ref 96–112)
GFR, Est Non African American: 72 mL/min
Potassium: 4 mEq/L (ref 3.5–5.3)

## 2012-06-21 LAB — SEDIMENTATION RATE: Sed Rate: 3 mm/hr (ref 0–22)

## 2012-06-24 ENCOUNTER — Ambulatory Visit (HOSPITAL_COMMUNITY)
Admission: RE | Admit: 2012-06-24 | Discharge: 2012-06-24 | Disposition: A | Discharge status: Home / Self Care | Payer: Medicare Other | Source: Ambulatory Visit | Attending: Infectious Disease | Admitting: Infectious Disease

## 2012-06-24 DIAGNOSIS — K7689 Other specified diseases of liver: Secondary | ICD-10-CM | POA: Insufficient documentation

## 2012-06-24 DIAGNOSIS — Q619 Cystic kidney disease, unspecified: Secondary | ICD-10-CM | POA: Insufficient documentation

## 2012-06-24 DIAGNOSIS — K750 Abscess of liver: Secondary | ICD-10-CM

## 2012-06-26 ENCOUNTER — Encounter: Payer: Self-pay | Admitting: Internal Medicine

## 2012-06-26 ENCOUNTER — Ambulatory Visit (INDEPENDENT_AMBULATORY_CARE_PROVIDER_SITE_OTHER): Payer: Medicare Other | Admitting: Internal Medicine

## 2012-06-26 VITALS — BP 120/70 | HR 85 | Temp 99.1°F | Ht 63.0 in | Wt 120.2 lb

## 2012-06-26 DIAGNOSIS — N898 Other specified noninflammatory disorders of vagina: Secondary | ICD-10-CM | POA: Insufficient documentation

## 2012-06-26 DIAGNOSIS — L293 Anogenital pruritus, unspecified: Secondary | ICD-10-CM

## 2012-06-26 DIAGNOSIS — Q613 Polycystic kidney, unspecified: Secondary | ICD-10-CM

## 2012-06-26 DIAGNOSIS — I959 Hypotension, unspecified: Secondary | ICD-10-CM

## 2012-06-26 HISTORY — DX: Other specified noninflammatory disorders of vagina: N89.8

## 2012-06-26 MED ORDER — FLUCONAZOLE 150 MG PO TABS: ORAL_TABLET | ORAL | Status: DC

## 2012-06-26 NOTE — Assessment & Plan Note (Signed)
stable overall by hx and exam, most recent data reviewed with pt, and pt to continue medical treatment as before Lab Results  Component Value Date   CREATININE 0.81 06/21/2012

## 2012-06-26 NOTE — Assessment & Plan Note (Signed)
Likely fungal, for diflucan asd,  to f/u any worsening symptoms or concerns

## 2012-06-26 NOTE — Patient Instructions (Addendum)
Take all new medications as prescribed - the diflucan Continue all other medications as before, but no Blood Pressure medications at this time (metoprolol, amlodipine, and lisinopril) Please check your blood pressure on a regular basis, your goal is to be less than 140/90

## 2012-06-26 NOTE — Progress Notes (Signed)
Subjective:    Patient ID: Alyssa Fernandez, female    DOB: Jan 12, 1941, 71 y.o.   MRN: [id number]  HPI  Here to f/u,  Lost wt from 133 to 120 since sept 11, recent with relative hypotension, 3 BP meds stopped per ID visit, as well as no further antibx and she feels overall remarkably improved today with improved weakness and stamina.  BP at home improved similar to today.   Pt denies chest pain, increased sob or doe, wheezing, orthopnea, PND, increased LE swelling, palpitations, dizziness or syncope.  Pt denies new neurological symptoms such as new headache, or facial or extremity weakness or numbness.  Pt denies polydipsia, polyuria. Denies worsening depressive symptoms, suicidal ideation, or panic.  Pt denies recent night sweats, loss of appetite, or other constitutional symptoms.  Recent labs with normal inflammatory markers, shared and d/w pt today.  Does incidentally have signficant vaginal introitus itching without other pain, swelling, redness or d/c, very unusual for her, just finished extended antibx course. Past Medical History  Diagnosis Date  . Hyperlipidemia   . Hypertension   . Polycystic kidney disease   . Anxiety   . RENAL FAILURE   . 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 HYPOTHYROIDISM   . GERD   . VITAMIN D DEFICIENCY   . OSTEOPENIA   . INSOMNIA   . Cervical spine degeneration     Severe by CT April 2012   Past Surgical History  Procedure Date  . Partial nephrectomy   . Abdominal hysterectomy   . Cervical disc surgery   . Bunionectomy   . Fx rt ankle   . Left temporal bx artery-negative 2    . Bladder operation, but per sling procedure   . 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 Percutaneous drainage of liver abscess 05/06/2012    reports that she has never smoked. She has never used smokeless tobacco. She reports that she does not drink alcohol or use illicit drugs. family history includes Stroke in her mother. Allergies  Allergen Reactions  . Codeine   .  Sulfonamide Derivatives    Current Outpatient Prescriptions on File Prior to Visit  Medication Sig Dispense Refill  . ALPRAZolam (XANAX) 0.5 MG tablet Take 1 tablet (0.5 mg total) by mouth 3 (three) times daily as needed.  90 tablet  5  . Ascorbic Acid (VITAMIN C) 100 MG tablet Take 100 mg by mouth daily.        . calcium carbonate (OS-CAL) 1250 MG tablet Take 1 tablet by mouth daily.        . citalopram (CELEXA) 20 MG tablet TAKE ONE TABLET EACH DAY FOR DEPRESSION-MAY INCREASE TO 2 TABLETS DAILY  30 tablet  5  . Cyanocobalamin (VITAMIN B 12 PO) Take by mouth.        . estrogens, conjugated, (PREMARIN) 0.625 MG tablet Take 1 tablet (0.625 mg total) by mouth daily. Take daily for 21 days then do not take for 7 days.  30 tablet  11  . furosemide (LASIX) 40 MG tablet Take 1 tablet (40 mg total) by mouth daily.  90 tablet  3  . HYDROcodone-acetaminophen (VICODIN) 5-500 MG per tablet Take 1 tablet by mouth every 4 (four) hours as needed for pain. Max of 4 tablets per day.  120 tablet  2  . omeprazole-sodium bicarbonate (ZEGERID) 40-1100 MG per capsule TAKE ONE CAPSULE EACH DAY  30 capsule  11  . potassium chloride (KLOR-CON) 20 MEQ packet Take  20 mEq by mouth 2 (two) times daily.  60 tablet  11  . PREMARIN vaginal cream INSERT 1/3 TO 1/2 APPLICATOR DAILY AT   BEDTIME  30 g  6  . simvastatin (ZOCOR) 80 MG tablet TAKE ONE-HALF TABLET AT BEDTIME  30 tablet  11  . ZOVIRAX 5 % cream APPLY TWICE DAILY  5 g  5   Review of Systems  Constitutional: Negative for diaphoresis and unexpected weight change.  HENT: Negative for tinnitus.   Eyes: Negative for photophobia and visual disturbance.  Respiratory: Negative for choking and stridor.   Gastrointestinal: Negative for vomiting and blood in stool.  Genitourinary: Negative for hematuria and decreased urine volume.  Musculoskeletal: Negative for gait problem.  Skin: Negative for color change and wound.  Neurological: Negative for tremors and numbness.    Psychiatric/Behavioral: Negative for decreased concentration. The patient is not hyperactive.       Objective:   Physical Exam BP 120/70  Pulse 85  Temp 99.1 F (37.3 C) (Oral)  Ht 5\' 3"  (1.6 m)  Wt 120 lb 4 oz (54.545 kg)  BMI 21.30 kg/m2  SpO2 95% Physical Exam  VS noted Constitutional: Pt appears well-developed and well-nourished.  HENT: Head: Normocephalic.  Right Ear: External ear normal.  Left Ear: External ear normal.  Eyes: Conjunctivae and EOM are normal. Pupils are equal, round, and reactive to light.  Neck: Normal range of motion. Neck supple.  Cardiovascular: Normal rate and regular rhythm.   Pulmonary/Chest: Effort normal and breath sounds normal.  GU: deferred Neurological: Pt is alert. Not confused  Skin: Skin is warm. No erythema.  Psychiatric: Pt behavior is normal. Thought content normal.     Assessment & Plan:

## 2012-06-26 NOTE — Assessment & Plan Note (Signed)
Improved BP and symptoms, ok to cont off meds for now, suspect at least in part related to recent wt loss and illness;  To cont to monitor BP at home and call for persistent elev > 140/90;  Suspect may experience higher BP again with wt gain, or more salt in diet and convalescence in general such as 1-2 months, may need med re-start BP Readings from Last 3 Encounters:  06/26/12 120/70  06/19/12 86/53  05/29/12 138/85

## 2012-07-04 ENCOUNTER — Telehealth: Payer: Self-pay

## 2012-07-04 MED ORDER — CITALOPRAM HYDROBROMIDE 20 MG PO TABS: 40.0000 mg | ORAL_TABLET | Freq: Every day | ORAL | Status: DC

## 2012-07-04 NOTE — Telephone Encounter (Signed)
Pharmacy requesting refill on Citalopram.  Patient stating is taking 2 daily, please advise on change and if ok.  Alyssa Fernandez

## 2012-07-04 NOTE — Telephone Encounter (Signed)
Done erx 

## 2012-07-17 ENCOUNTER — Ambulatory Visit (INDEPENDENT_AMBULATORY_CARE_PROVIDER_SITE_OTHER): Payer: Medicare Other | Admitting: Internal Medicine

## 2012-07-17 ENCOUNTER — Encounter: Payer: Self-pay | Admitting: Internal Medicine

## 2012-07-17 VITALS — BP 138/84 | HR 97 | Temp 98.1°F | Ht 63.0 in | Wt 120.0 lb

## 2012-07-17 DIAGNOSIS — K750 Abscess of liver: Secondary | ICD-10-CM

## 2012-07-17 NOTE — Assessment & Plan Note (Signed)
Has had resolution of her abscess and no further treatment indicated. She will follow up on a p.r.n. Basis. She knows call if she has any concerns

## 2012-07-17 NOTE — Progress Notes (Signed)
  Subjective:    Patient ID: Alyssa Fernandez, female    DOB: 01-Oct-1940, 71 y.o.   MRN: [id number]  HPI She comes in for followup of her liver abscess. She completed a course of IV and then oral antibiotics and did have a drain in place which has since been removed. Since her previous visit she had an ultrasound which showed resolution of her abscess. She has no fever or chills or any new complaints. She has remained off of her blood pressure medicines.   Review of Systems  Constitutional: Positive for activity change and fatigue. Negative for fever and chills.  Gastrointestinal: Negative for nausea, abdominal pain, diarrhea and abdominal distention.  Skin: Negative for rash.       Objective:   Physical Exam  Constitutional: She appears well-developed and well-nourished. No distress.  Abdominal: Soft. Bowel sounds are normal. She exhibits no distension. There is no tenderness.          Assessment & Plan:

## 2012-07-29 ENCOUNTER — Telehealth: Payer: Self-pay

## 2012-07-29 MED ORDER — ZOLPIDEM TARTRATE 10 MG PO TABS: 10.0000 mg | ORAL_TABLET | Freq: Every evening | ORAL | Status: AC | PRN

## 2012-07-29 NOTE — Telephone Encounter (Signed)
Pharmacy requesting refill on Zolpidem 10 mg medication is not on current list.

## 2012-07-29 NOTE — Telephone Encounter (Signed)
Done hardcopy to [facility]bin  

## 2012-07-30 NOTE — Telephone Encounter (Signed)
Faxed hardcopy to pharmacy. 

## 2012-08-06 ENCOUNTER — Other Ambulatory Visit: Payer: Self-pay

## 2012-08-06 MED ORDER — ALPRAZOLAM 0.5 MG PO TABS: 0.5000 mg | ORAL_TABLET | Freq: Three times a day (TID) | ORAL | Status: DC | PRN

## 2012-08-06 NOTE — Telephone Encounter (Signed)
Done hardcopy to [facility]bin  

## 2012-08-06 NOTE — Telephone Encounter (Signed)
Faxed hardcopy to pharmacy. 

## 2012-08-20 ENCOUNTER — Telehealth: Payer: Self-pay | Admitting: Internal Medicine

## 2012-08-20 NOTE — Telephone Encounter (Signed)
Returned pt's call and obtained her VM.  Message left to call back if assistance is still needed.

## 2012-11-20 ENCOUNTER — Ambulatory Visit (INDEPENDENT_AMBULATORY_CARE_PROVIDER_SITE_OTHER): Payer: Medicare Other | Admitting: Internal Medicine

## 2012-11-20 ENCOUNTER — Encounter: Payer: Self-pay | Admitting: Internal Medicine

## 2012-11-20 VITALS — BP 120/80 | HR 60 | Temp 97.6°F | Ht 63.0 in | Wt 124.2 lb

## 2012-11-20 DIAGNOSIS — M79609 Pain in unspecified limb: Secondary | ICD-10-CM

## 2012-11-20 DIAGNOSIS — Z0000 Encounter for general adult medical examination without abnormal findings: Secondary | ICD-10-CM

## 2012-11-20 DIAGNOSIS — R609 Edema, unspecified: Secondary | ICD-10-CM

## 2012-11-20 DIAGNOSIS — K219 Gastro-esophageal reflux disease without esophagitis: Secondary | ICD-10-CM

## 2012-11-20 DIAGNOSIS — M79671 Pain in right foot: Secondary | ICD-10-CM | POA: Insufficient documentation

## 2012-11-20 DIAGNOSIS — I10 Essential (primary) hypertension: Secondary | ICD-10-CM

## 2012-11-20 HISTORY — DX: Pain in right foot: M79.671

## 2012-11-20 MED ORDER — FUROSEMIDE 40 MG PO TABS: 40.0000 mg | ORAL_TABLET | Freq: Every day | ORAL | Status: DC | PRN

## 2012-11-20 MED ORDER — POTASSIUM CHLORIDE 20 MEQ PO PACK: 20.0000 meq | PACK | Freq: Every day | ORAL | Status: DC | PRN

## 2012-11-20 MED ORDER — PANTOPRAZOLE SODIUM 40 MG PO TBEC: 40.0000 mg | DELAYED_RELEASE_TABLET | Freq: Every day | ORAL | Status: DC

## 2012-11-20 MED ORDER — ZOLPIDEM TARTRATE 10 MG PO TABS: 10.0000 mg | ORAL_TABLET | Freq: Every evening | ORAL | Status: AC | PRN

## 2012-11-20 NOTE — Assessment & Plan Note (Signed)
zegerid too expensive, to change to generic protonix, +/- tums prn

## 2012-11-20 NOTE — Assessment & Plan Note (Signed)
Has done well of her previous antiHTN tx, to cont same tx stable overall by history and exam, recent data reviewed with pt, and pt to continue medical treatment as before,  to f/u any worsening symptoms or concerns BP Readings from Last 3 Encounters:  11/20/12 120/80  07/17/12 138/84  06/26/12 120/70

## 2012-11-20 NOTE — Progress Notes (Signed)
Subjective:    Patient ID: Alyssa Fernandez, female    DOB: February 03, 1941, 72 y.o.   MRN: [id number]  HPI  Here to f/u; overall doing ok,  Pt denies chest pain, increased sob or doe, wheezing, orthopnea, PND, increased LE swelling, palpitations, dizziness or syncope.  Pt denies polydipsia, polyuria, or low sugar symptoms such as weakness or confusion improved with po intake.  Pt denies new neurological symptoms such as new headache, or facial or extremity weakness or numbness except for 3-6 mo recurring bilat toe pain and numbness, worse at night, last LE art dopplers years ago, no clear claudication.   Pt states overall good compliance with meds, has been trying to follow lower cholesterol, diabetic diet, with wt overall stable,  but little exercise however.  Zegerid too expensive now, asks for change in tx for chronic reflux Past Medical History  Diagnosis Date  . Hyperlipidemia   . Hypertension   . Polycystic kidney disease   . Anxiety   . RENAL FAILURE   . 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 HYPOTHYROIDISM   . GERD   . VITAMIN D DEFICIENCY   . OSTEOPENIA   . INSOMNIA   . 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 Partial nephrectomy    . Abdominal hysterectomy    . Cervical disc surgery    . Bunionectomy    . Fx rt ankle    . Left temporal bx artery-negative 2     . Bladder operation, but per sling procedure    . 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 Percutaneous drainage of liver abscess  05/06/2012    reports that she has never smoked. She has never used smokeless tobacco. She reports that she does not drink alcohol or use illicit drugs. family history includes Stroke in her mother. Allergies  Allergen Reactions  . Codeine   . Sulfonamide Derivatives    Current Outpatient Prescriptions on File Prior to Visit  Medication Sig Dispense Refill  . ALPRAZolam (XANAX) 0.5 MG tablet Take 1 tablet (0.5 mg  total) by mouth 3 (three) times daily as needed.  90 tablet  5  . Ascorbic Acid (VITAMIN C) 100 MG tablet Take 100 mg by mouth daily.        . calcium carbonate (OS-CAL) 1250 MG tablet Take 1 tablet by mouth daily.        . citalopram (CELEXA) 20 MG tablet Take 2 tablets (40 mg total) by mouth daily.  180 tablet  3  . estrogens, conjugated, (PREMARIN) 0.625 MG tablet Take 1 tablet (0.625 mg total) by mouth daily. Take daily for 21 days then do not take for 7 days.  30 tablet  11  . HYDROcodone-acetaminophen (VICODIN) 5-500 MG per tablet Take 1 tablet by mouth every 4 (four) hours as needed for pain. Max of 4 tablets per day.  120 tablet  2  . simvastatin (ZOCOR) 80 MG tablet TAKE ONE-HALF TABLET AT BEDTIME  30 tablet  11  . ZOVIRAX 5 % cream APPLY TWICE DAILY  5 g  5  . fluconazole (DIFLUCAN) 150 MG tablet 1 tab by mouth every 3 days as needed  2 tablet  1   No current facility-administered medications on file prior to visit.   Review of Systems  Constitutional: Negative for unexpected weight change, or unusual diaphoresis  HENT: Negative for tinnitus.  Eyes: Negative for photophobia and visual disturbance.  Respiratory: Negative for choking and stridor.   Gastrointestinal: Negative for vomiting and blood in stool.  Genitourinary: Negative for hematuria and decreased urine volume.  Musculoskeletal: Negative for acute joint swelling Skin: Negative for color change and wound.  Neurological: Negative for tremors and numbness other than noted  Psychiatric/Behavioral: Negative for decreased concentration or  hyperactivity.       Objective:   Physical Exam BP 120/80  Pulse 60  Temp(Src) 97.6 F (36.4 C) (Oral)  Ht 5\' 3"  (1.6 m)  Wt 124 lb 4 oz (56.359 kg)  BMI 22.02 kg/m2  SpO2 97% VS noted,  Constitutional: Pt appears well-developed and well-nourished.  HENT: Head: NCAT.  Right Ear: External ear normal.  Left Ear: External ear normal.  Eyes: Conjunctivae and EOM are normal. Pupils  are equal, round, and reactive to light.  Neck: Normal range of motion. Neck supple.  Cardiovascular: Normal rate and regular rhythm.   Pulmonary/Chest: Effort normal and breath sounds normal.  Abd:  Soft, NT, non-distended, + BS Neurological: Pt is alert. Not confused , motor/dtr/gait intact Skin: Skin is warm. No erythema. No rash, dorsalis pedis trace on left, 1+ on right Psychiatric: Pt behavior is normal. Thought content normal. mild nervous    Assessment & Plan:

## 2012-11-20 NOTE — Patient Instructions (Addendum)
Please continue to monitor your Blood Pressure at home, with the goal being less than 140/90. OK to stop the zegerid  OK to take the Lasix and Potassium pill once daily as needed only Please take all new medication as prescribed- the generic protonix You will be contacted regarding the referral for: Lower extremity arterial dopplers Thank you for enrolling in MyChart. Please follow the instructions below to securely access your online medical record. MyChart allows you to send messages to your doctor, view your test results, renew your prescriptions, schedule appointments, and more. To Log into My Chart online, please go by [employer] or [employer] to [email or web address], or download the MyChart App from the [employer] of [employer] .  Your Username is: [email or web address] (pass [email or web address]) Please send a practice Message on Mychart later today. Please return in 6 months, or sooner if needed, with Lab testing done 3-5 days before

## 2012-11-20 NOTE — Assessment & Plan Note (Signed)
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

## 2012-11-20 NOTE — Assessment & Plan Note (Signed)
None today, to change lasix/K to prn

## 2012-11-26 ENCOUNTER — Encounter (INDEPENDENT_AMBULATORY_CARE_PROVIDER_SITE_OTHER): Payer: Medicare Other

## 2012-11-26 DIAGNOSIS — I739 Peripheral vascular disease, unspecified: Secondary | ICD-10-CM

## 2012-11-26 DIAGNOSIS — M79671 Pain in right foot: Secondary | ICD-10-CM

## 2012-12-31 ENCOUNTER — Telehealth: Payer: Self-pay

## 2012-12-31 NOTE — Telephone Encounter (Signed)
Advise on hydrocodone refill 5/500 no longer available

## 2012-12-31 NOTE — Telephone Encounter (Signed)
The patient would like to change premarin 0.625 to estradiol

## 2012-12-31 NOTE — Telephone Encounter (Signed)
Done erx 

## 2013-01-01 MED ORDER — HYDROCODONE-ACETAMINOPHEN 5-325 MG PO TABS: 1.0000 | ORAL_TABLET | Freq: Four times a day (QID) | ORAL | Status: DC | PRN

## 2013-01-01 NOTE — Telephone Encounter (Signed)
Done hardcopy to [facility]bin  

## 2013-01-01 NOTE — Telephone Encounter (Signed)
Faxed hard copy to [facility].  

## 2013-01-01 NOTE — Telephone Encounter (Signed)
Please advise on hydrocodone request also 5/500 no longer available

## 2013-01-09 ENCOUNTER — Other Ambulatory Visit: Payer: Self-pay | Admitting: Internal Medicine

## 2013-01-09 NOTE — Telephone Encounter (Signed)
Done hardcopy to [facility]bin  

## 2013-01-09 NOTE — Telephone Encounter (Signed)
Faxed hard copy to [facility].  

## 2013-02-04 ENCOUNTER — Encounter: Payer: Self-pay | Admitting: Internal Medicine

## 2013-02-04 ENCOUNTER — Ambulatory Visit (INDEPENDENT_AMBULATORY_CARE_PROVIDER_SITE_OTHER): Payer: Medicare Other | Admitting: Internal Medicine

## 2013-02-04 ENCOUNTER — Other Ambulatory Visit (INDEPENDENT_AMBULATORY_CARE_PROVIDER_SITE_OTHER): Payer: Medicare Other

## 2013-02-04 VITALS — BP 160/100 | HR 78 | Temp 98.1°F | Ht 63.5 in | Wt 126.1 lb

## 2013-02-04 DIAGNOSIS — G609 Hereditary and idiopathic neuropathy, unspecified: Secondary | ICD-10-CM

## 2013-02-04 DIAGNOSIS — E785 Hyperlipidemia, unspecified: Secondary | ICD-10-CM

## 2013-02-04 DIAGNOSIS — R609 Edema, unspecified: Secondary | ICD-10-CM

## 2013-02-04 DIAGNOSIS — Q613 Polycystic kidney, unspecified: Secondary | ICD-10-CM

## 2013-02-04 DIAGNOSIS — I10 Essential (primary) hypertension: Secondary | ICD-10-CM

## 2013-02-04 DIAGNOSIS — G629 Polyneuropathy, unspecified: Secondary | ICD-10-CM

## 2013-02-04 LAB — BASIC METABOLIC PANEL
CO2: 29 mEq/L (ref 19–32)
Chloride: 100 mEq/L (ref 96–112)
Creatinine, Ser: 1 mg/dL (ref 0.4–1.2)
Potassium: 3.6 mEq/L (ref 3.5–5.1)
Sodium: 141 mEq/L (ref 135–145)

## 2013-02-04 MED ORDER — LISINOPRIL 20 MG PO TABS: 20.0000 mg | ORAL_TABLET | Freq: Every day | ORAL | Status: DC

## 2013-02-04 MED ORDER — AMLODIPINE BESYLATE 5 MG PO TABS: 5.0000 mg | ORAL_TABLET | Freq: Every day | ORAL | Status: DC

## 2013-02-04 NOTE — Patient Instructions (Addendum)
Please take all new medication as prescribed Please continue all other medications as before Please continue to monitor your  [facility] and return in 3 wks if overall still > 140/90 Please go to the LAB in the Basement (turn left off the elevator) for the tests to be done today You will be contacted by phone if any changes need to be made immediately.  Otherwise, you will receive a letter about your results with an explanation  Please remember to sign up for My Chart if you have not done so, as this will be important to you in the future with finding out test results, communicating by private email, and scheduling acute appointments online when needed.

## 2013-02-04 NOTE — Assessment & Plan Note (Signed)
None today,  to f/u any worsening symptoms or concerns 

## 2013-02-05 DIAGNOSIS — G629 Polyneuropathy, unspecified: Secondary | ICD-10-CM | POA: Insufficient documentation

## 2013-02-05 HISTORY — DX: Polyneuropathy, unspecified: G62.9

## 2013-02-05 NOTE — Progress Notes (Signed)
Subjective:    Patient ID: Alyssa Fernandez, female    DOB: Sep 19, 1940, 72 y.o.   MRN: [id number]  HPI  Here to f/u; overall doing ok,  Pt denies chest pain, increased sob or doe, wheezing, orthopnea, PND, increased LE swelling, palpitations, dizziness or syncope.  Pt denies polydipsia, polyuria, or low sugar symptoms such as weakness or confusion improved with po intake.  Pt denies new neurological symptoms such as new facial or extremity weakness but cont's to have distal neuropathic pains, better with current meds, and has had several fleeting sharp headaches, and a recent left subconjunctival hemorrhage.   Pt states overall good compliance with meds, has been trying to follow lower cholesterol diet, with wt overall stable,  but little exercise however. Denies worsening depressive symptoms, suicidal ideation, or panic Past Medical History  Diagnosis Date  . Hyperlipidemia   . Hypertension   . Polycystic kidney disease   . Anxiety   . RENAL FAILURE   . 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 HYPOTHYROIDISM   . GERD   . VITAMIN D DEFICIENCY   . OSTEOPENIA   . INSOMNIA   . 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 Partial nephrectomy    . Abdominal hysterectomy    . Cervical disc surgery    . Bunionectomy    . Fx rt ankle    . Left temporal bx artery-negative 2     . Bladder operation, but per sling procedure    . 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 Percutaneous drainage of liver abscess  05/06/2012    reports that she has never smoked. She has never used smokeless tobacco. She reports that she does not drink alcohol or use illicit drugs. family history includes Stroke in her mother. Allergies  Allergen Reactions  . Codeine   . Sulfonamide Derivatives    Current Outpatient Prescriptions on File Prior to Visit  Medication Sig Dispense Refill  . ALPRAZolam (XANAX) 0.5 MG tablet Take 1 tablet (0.5 mg total)  by mouth 3 (three) times daily as needed.  90 tablet  5  . Ascorbic Acid (VITAMIN C) 100 MG tablet Take 100 mg by mouth daily.        . calcium carbonate (OS-CAL) 1250 MG tablet Take 1 tablet by mouth daily.        . citalopram (CELEXA) 20 MG tablet Take 2 tablets (40 mg total) by mouth daily.  180 tablet  3  . fluconazole (DIFLUCAN) 150 MG tablet 1 tab by mouth every 3 days as needed  2 tablet  1  . furosemide (LASIX) 40 MG tablet Take 1 tablet (40 mg total) by mouth daily as needed.  90 tablet  3  . HYDROcodone-acetaminophen (NORCO/VICODIN) 5-325 MG per tablet Take 1 tablet by mouth every 6 (six) hours as needed for pain.  120 tablet  2  . pantoprazole (PROTONIX) 40 MG tablet Take 1 tablet (40 mg total) by mouth daily.  90 tablet  3  . potassium chloride (KLOR-CON) 20 MEQ packet Take 20 mEq by mouth daily as needed.  60 tablet  11  . simvastatin (ZOCOR) 80 MG tablet TAKE ONE-HALF TABLET AT BEDTIME  30 tablet  11  . zolpidem (AMBIEN) 10 MG tablet TAKE ONE TABLET AT BEDTIME AS NEEDED FOR SLEEP  90 tablet  1  . ZOVIRAX 5 % cream APPLY TWICE DAILY  5 g  5   No current facility-administered medications on file prior to visit.   Review of Systems  Constitutional: Negative for unexpected weight change, or unusual diaphoresis  HENT: Negative for tinnitus.   Eyes: Negative for photophobia and visual disturbance.  Respiratory: Negative for choking and stridor.   Gastrointestinal: Negative for vomiting and blood in stool.  Genitourinary: Negative for hematuria and decreased urine volume.  Musculoskeletal: Negative for acute joint swelling Skin: Negative for color change and wound.  Neurological: Negative for tremors and numbness other than noted  Psychiatric/Behavioral: Negative for decreased concentration or  hyperactivity.       Objective:   Physical Exam BP 160/100  Pulse 78  Temp(Src) 98.1 F (36.7 C) (Oral)  Ht 5' 3.5" (1.613 m)  Wt 126 lb 2 oz (57.21 kg)  BMI 21.99 kg/m2  SpO2  97% VS noted,  Constitutional: Pt appears well-developed and well-nourished.  HENT: Head: NCAT.  Right Ear: External ear normal.  Left Ear: External ear normal.  Eyes: Conjunctivae and EOM are normal. Pupils are equal, round, and reactive to light.  Neck: Normal range of motion. Neck supple.  Cardiovascular: Normal rate and regular rhythm.   Pulmonary/Chest: Effort normal and breath sounds normal.  Abd:  Soft, NT, non-distended, + BS Neurological: Pt is alert. Not confused , cn 2-12 intact, motor 5/5, gait intact Skin: Skin is warm. No erythema. No LE edema Psychiatric: Pt behavior is normal. Thought content normal. not depressed affect, mild nervous    Assessment & Plan:

## 2013-02-05 NOTE — Assessment & Plan Note (Signed)
stable overall by history and exam, recent data reviewed with pt,   to f/u any worsening symptoms or concerns BP Readings from Last 3 Encounters:  02/04/13 160/100  11/20/12 120/80  07/17/12 138/84   To add norvasc 5 qd, cont all other meds,

## 2013-02-05 NOTE — Assessment & Plan Note (Signed)
Cont current tx, stable

## 2013-02-05 NOTE — Assessment & Plan Note (Signed)
stable overall by history and exam, recent data reviewed with pt, and pt to continue medical treatment as before,  to f/u any worsening symptoms or concerns Lab Results  Component Value Date   LDLCALC 77 11/20/2011

## 2013-02-05 NOTE — Assessment & Plan Note (Signed)
stable overall by history and exam, recent data reviewed with pt, and pt to continue medical treatment as before,  to f/u any worsening symptoms or concerns Lab Results  Component Value Date   CREATININE 1.0 02/04/2013

## 2013-02-12 ENCOUNTER — Other Ambulatory Visit: Payer: Self-pay | Admitting: Internal Medicine

## 2013-05-16 ENCOUNTER — Other Ambulatory Visit: Payer: Self-pay | Admitting: Internal Medicine

## 2013-05-19 ENCOUNTER — Other Ambulatory Visit: Payer: Self-pay | Admitting: Internal Medicine

## 2013-05-19 NOTE — Telephone Encounter (Signed)
Ok to refill? Last OV 6.17.14 Last filled 12.17.13

## 2013-05-19 NOTE — Telephone Encounter (Signed)
Done hardcopy to [facility]bin  

## 2013-05-20 NOTE — Telephone Encounter (Signed)
Faxed hard copy to [facility].  

## 2013-05-22 ENCOUNTER — Ambulatory Visit (INDEPENDENT_AMBULATORY_CARE_PROVIDER_SITE_OTHER): Payer: Medicare Other | Admitting: Internal Medicine

## 2013-05-22 ENCOUNTER — Other Ambulatory Visit (INDEPENDENT_AMBULATORY_CARE_PROVIDER_SITE_OTHER): Payer: Medicare Other

## 2013-05-22 ENCOUNTER — Encounter: Payer: Self-pay | Admitting: Internal Medicine

## 2013-05-22 ENCOUNTER — Telehealth: Payer: Self-pay | Admitting: Internal Medicine

## 2013-05-22 VITALS — BP 152/82 | HR 62 | Temp 97.7°F | Ht 62.0 in | Wt 127.2 lb

## 2013-05-22 DIAGNOSIS — M79609 Pain in unspecified limb: Secondary | ICD-10-CM

## 2013-05-22 DIAGNOSIS — M79671 Pain in right foot: Secondary | ICD-10-CM

## 2013-05-22 DIAGNOSIS — Z0000 Encounter for general adult medical examination without abnormal findings: Secondary | ICD-10-CM

## 2013-05-22 DIAGNOSIS — Z23 Encounter for immunization: Secondary | ICD-10-CM

## 2013-05-22 DIAGNOSIS — I10 Essential (primary) hypertension: Secondary | ICD-10-CM

## 2013-05-22 DIAGNOSIS — G47429 Narcolepsy in conditions classified elsewhere without cataplexy: Secondary | ICD-10-CM

## 2013-05-22 LAB — URINALYSIS, ROUTINE W REFLEX MICROSCOPIC
Bilirubin Urine: NEGATIVE
Hgb urine dipstick: NEGATIVE
Ketones, ur: NEGATIVE
Nitrite: NEGATIVE
Urine Glucose: NEGATIVE
Urobilinogen, UA: 0.2 (ref 0.0–1.0)
pH: 7 (ref 5.0–8.0)

## 2013-05-22 LAB — CBC WITH DIFFERENTIAL/PLATELET
Basophils Relative: 0.6 % (ref 0.0–3.0)
Eosinophils Absolute: 0.2 10*3/uL (ref 0.0–0.7)
Eosinophils Relative: 3.6 % (ref 0.0–5.0)
Hemoglobin: 13.6 g/dL (ref 12.0–15.0)
Lymphocytes Relative: 23.5 % (ref 12.0–46.0)
MCHC: 33.7 g/dL (ref 30.0–36.0)
MCV: 90.1 fl (ref 78.0–100.0)
Monocytes Absolute: 0.6 10*3/uL (ref 0.1–1.0)
Neutro Abs: 4.4 10*3/uL (ref 1.4–7.7)
RBC: 4.47 Mil/uL (ref 3.87–5.11)
WBC: 6.9 10*3/uL (ref 4.5–10.5)

## 2013-05-22 MED ORDER — AMPHETAMINE-DEXTROAMPHETAMINE 10 MG PO TABS: 10.0000 mg | ORAL_TABLET | Freq: Two times a day (BID) | ORAL | Status: DC

## 2013-05-22 MED ORDER — LISINOPRIL 40 MG PO TABS: 40.0000 mg | ORAL_TABLET | Freq: Every day | ORAL | Status: DC

## 2013-05-22 MED ORDER — ESTROGENS CONJUGATED 0.625 MG PO TABS: 0.6250 mg | ORAL_TABLET | Freq: Every day | ORAL | Status: DC

## 2013-05-22 NOTE — Telephone Encounter (Signed)
Called left message to call back 

## 2013-05-22 NOTE — Assessment & Plan Note (Signed)
Mild uncontrolled, for incr lisionpril to 40 mg, with f/u bmet in 2 wks

## 2013-05-22 NOTE — Telephone Encounter (Signed)
This is not normally done as the pt is already on premarin oral pills

## 2013-05-22 NOTE — Telephone Encounter (Signed)
Patient informed. 

## 2013-05-22 NOTE — Assessment & Plan Note (Signed)
For re-start adderall

## 2013-05-22 NOTE — Progress Notes (Addendum)
Subjective:    Patient ID: Alyssa Fernandez, female    DOB: 12-10-1940, 71 y.o.   MRN: [id number]  HPI  Here for wellness and f/u;  Overall doing ok;  Pt denies CP, worsening SOB, DOE, wheezing, orthopnea, PND, worsening LE edema, palpitations, dizziness or syncope.  Pt denies neurological change such as new headache, facial or extremity weakness.  Pt denies polydipsia, polyuria, or low sugar symptoms. Pt states overall good compliance with treatment and medications, good tolerability, and has been trying to follow lower cholesterol diet.  Pt denies worsening depressive symptoms, suicidal ideation or panic. No fever, night sweats, wt loss, loss of appetite, or other constitutional symptoms.  Pt states good ability with ADL's, has low fall risk, home safety reviewed and adequate, no other significant changes in hearing or vision, and only occasionally active with exercise.  Not taking B12 pills recently.   [facility], BP had been quite high before last visit, since then BP's have been similar to today.  Had some minor swelling to legs with taking the amlod 5 mg in the AM, better when takes in the PM.  Stopped her K 20 meq a few months ago as seemed to cause GI upset.  Doing research, driving more lately, has not been taking the adderall in past 2 yrs as has been driving less.   Past Medical History  Diagnosis Date  . Hyperlipidemia   . Hypertension   . Polycystic kidney disease   . Anxiety   . RENAL FAILURE   . 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 HYPOTHYROIDISM   . GERD   . VITAMIN D DEFICIENCY   . OSTEOPENIA   . INSOMNIA   . 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 Partial nephrectomy    . Abdominal hysterectomy    . Cervical disc surgery    . Bunionectomy    . Fx rt ankle    . Left temporal bx artery-negative 2     . Bladder operation, but per sling procedure    . 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 Percutaneous drainage of liver abscess  05/06/2012    reports that she has never smoked. She has never used smokeless tobacco. She reports that she does not drink alcohol or use illicit drugs. family history includes Stroke in her mother. Allergies  Allergen Reactions  . Codeine   . Sulfonamide Derivatives    Current Outpatient Prescriptions on File Prior to Visit  Medication Sig Dispense Refill  . ALPRAZolam (XANAX) 0.5 MG tablet TAKE ONE TABLET THREE TIMES DAILY AS NEEDED  90 tablet  2  . amLODipine (NORVASC) 5 MG tablet Take 1 tablet (5 mg total) by mouth daily.  90 tablet  3  . Ascorbic Acid (VITAMIN C) 100 MG tablet Take 100 mg by mouth daily.        . calcium carbonate (OS-CAL) 1250 MG tablet Take 1 tablet by mouth daily.        . fluconazole (DIFLUCAN) 150 MG tablet 1 tab by mouth every 3 days as needed  2 tablet  1  . furosemide (LASIX) 40 MG tablet Take 1 tablet (40 mg total) by mouth daily as needed.  90 tablet  3  . HYDROcodone-acetaminophen (NORCO/VICODIN) 5-325 MG per tablet Take 1 tablet by mouth every 6 (six) hours as needed for pain.  120 tablet  2  . pantoprazole (PROTONIX) 40 MG tablet Take 1 tablet (40 mg total) by mouth daily.  90 tablet  3  . sertraline (ZOLOFT) 100 MG tablet TAKE ONE TABLET TWICE DAILY  60 tablet  6  . simvastatin (ZOCOR) 80 MG tablet TAKE ONE-HALF TABLET AT BEDTIME  30 tablet  11  . zolpidem (AMBIEN) 10 MG tablet TAKE ONE TABLET AT BEDTIME AS NEEDED FOR SLEEP  90 tablet  1  . ZOVIRAX 5 % cream APPLY TWICE DAILY  5 g  5   No current facility-administered medications on file prior to visit.    Review of Systems Constitutional: Negative for diaphoresis, activity change, appetite change or unexpected weight change.  HENT: Negative for hearing loss, ear pain, facial swelling, mouth sores and neck stiffness.   Eyes: Negative for pain, redness and visual disturbance.  Respiratory: Negative for shortness of breath and wheezing.   Cardiovascular:  Negative for chest pain and palpitations.  Gastrointestinal: Negative for diarrhea, blood in stool, abdominal distention or other pain Genitourinary: Negative for hematuria, flank pain or change in urine volume.  Musculoskeletal: Negative for myalgias and joint swelling.  Skin: Negative for color change and wound.  Neurological: Negative for syncope and numbness. other than noted Hematological: Negative for adenopathy.  Psychiatric/Behavioral: Negative for hallucinations, self-injury, decreased concentration and agitation.      Objective:   Physical Exam BP 152/82  Pulse 62  Temp(Src) 97.7 F (36.5 C) (Oral)  Ht 5\' 2"  (1.575 m)  Wt 127 lb 4 oz (57.72 kg)  BMI 23.27 kg/m2  SpO2 96% VS noted,  Constitutional: Pt is oriented to person, place, and time. Appears well-developed and well-nourished.  Head: Normocephalic and atraumatic.  Right Ear: External ear normal.  Left Ear: External ear normal.  Nose: Nose normal.  Mouth/Throat: Oropharynx is clear and moist.  Eyes: Conjunctivae and EOM are normal. Pupils are equal, round, and reactive to light.  Neck: Normal range of motion. Neck supple. No JVD present. No tracheal deviation present.  Cardiovascular: Normal rate, regular rhythm, normal heart sounds and intact distal pulses.   Pulmonary/Chest: Effort normal and breath sounds normal.  Abdominal: Soft. Bowel sounds are normal. There is no tenderness. No HSM  Musculoskeletal: Normal range of motion. Exhibits no edema.  Lymphadenopathy:  Has no cervical adenopathy.  Neurological: Pt is alert and oriented to person, place, and time. Pt has normal reflexes. No cranial nerve deficit.  Skin: Skin is warm and dry. No rash noted.  Psychiatric:  Has  normal mood and affect. Behavior is normal.     Assessment & Plan:  Quality Measures addressed:  Mammogram:  pt declines and will self-refer

## 2013-05-22 NOTE — Patient Instructions (Addendum)
You had the flu shot today, and the Prevnar pneumonia shot Please take all new medication as prescribed - the adderall as needed, and the increased lisinopril to 40 mg per day Please continue all other medications as before, and refills have been done if requested. Please continue your efforts at being more active, low cholesterol diet, and weight control. You are otherwise up to date with prevention measures today. Please keep your appointments with your specialists as you may have planned  Please go to the LAB in the Basement (turn left off the elevator) for the tests to be done today  You will be contacted by phone if any changes need to be made immediately.  Otherwise, you will receive a letter about your results with an explanation, but please check with MyChart first.  Please return in 2 weeks for a second BMET to make sure the increased BP pill is not hurting you.  Please return in 1 year for your yearly visit, or sooner if needed

## 2013-05-22 NOTE — Addendum Note (Signed)
Addended by: Corwin Levins on: 05/22/2013 01:13 PM   Modules accepted: Orders

## 2013-05-22 NOTE — Addendum Note (Signed)
Addended by: Scharlene Gloss B on: 05/22/2013 10:51 AM   Modules accepted: Orders

## 2013-05-22 NOTE — Telephone Encounter (Signed)
Message copied by Corwin Levins on Thu May 22, 2013 12:22 PM ------      Message from: Scharlene Gloss B      Created: Thu May 22, 2013 10:53 AM       The patient wanted premarin cream to the pharmacy, I did not see on current list. ------

## 2013-05-22 NOTE — Assessment & Plan Note (Signed)

## 2013-05-23 LAB — BASIC METABOLIC PANEL
Calcium: 8.9 mg/dL (ref 8.4–10.5)
Creatinine, Ser: 0.9 mg/dL (ref 0.4–1.2)
GFR: 67.63 mL/min (ref >60.00)

## 2013-05-23 LAB — TSH: TSH: 1.04 u[IU]/mL (ref 0.35–5.50)

## 2013-05-23 LAB — LIPID PANEL
HDL: 60.1 mg/dL (ref >39.00)
LDL Cholesterol: 91 mg/dL (ref 0–99)
Total CHOL/HDL Ratio: 3
Triglycerides: 115 mg/dL (ref 0.0–149.0)
VLDL: 23 mg/dL (ref 0.0–40.0)

## 2013-05-23 LAB — HEPATIC FUNCTION PANEL
Bilirubin, Direct: 0.1 mg/dL (ref 0.0–0.3)
Total Bilirubin: 0.6 mg/dL (ref 0.3–1.2)

## 2013-06-12 ENCOUNTER — Other Ambulatory Visit: Payer: Self-pay | Admitting: Internal Medicine

## 2013-07-21 ENCOUNTER — Other Ambulatory Visit: Payer: Self-pay | Admitting: Internal Medicine

## 2013-11-24 ENCOUNTER — Other Ambulatory Visit: Payer: Self-pay | Admitting: Internal Medicine

## 2013-11-25 NOTE — Telephone Encounter (Signed)
Done hardcopy to [facility]bin  

## 2013-11-25 NOTE — Telephone Encounter (Signed)
Faxed hard copy to [facility].  

## 2013-12-02 ENCOUNTER — Other Ambulatory Visit: Payer: Self-pay | Admitting: Internal Medicine

## 2014-01-05 ENCOUNTER — Telehealth: Payer: Self-pay | Admitting: Internal Medicine

## 2014-01-05 NOTE — Telephone Encounter (Signed)
Ok to work in for [employer]

## 2014-01-05 NOTE — Telephone Encounter (Signed)
Patient Information:  Caller Name: Cacie  Phone: [phone]  Patient: Alyssa Fernandez, Alyssa Fernandez  Gender: Female  DOB: Aug 07, 1941  Age: 73 Years  PCP: Cathlean Cower (Adults only)  Office Follow Up:  Does the office need to follow up with this patient?: Yes  Instructions For The Office: No appts available, 'see today' disposition.  Pt had elevated BP at dentist earlier today (see note below).  Has questions if med's need to be changed.  Please follow up with pt with further guidance since no appts.   Symptoms  Reason For Call & Symptoms: 12/29/13 pt was seen for cataracts and BP was high while at appt.   01/05/14  pt was at dentist this morning,  BP 198/84 pulse 58 , had them recheck 30 minutes later BP  226/105 pulse 79.  Pt is on Norvasc and Lisinopril qhs.  Pt cannot check BP at home, [facility]   Mild ankle swelling at night.   Periodic sharp headaches, periodic dizziness, no chest pain.   The headache and dizziness usually happen before bed at night or when getting up throughout the night.   Pt has questions if meds need to be changed and she is worried that polycystic kidney disease could be causing issues with BP, as well.  Reviewed Health History In EMR: Yes  Reviewed Medications In EMR: Yes  Reviewed Allergies In EMR: Yes  Reviewed Surgeries / Procedures: Yes  Date of Onset of Symptoms: 01/05/2014  Guideline(s) Used:  High Blood Pressure  Disposition Per Guideline:   See Today in Office  Reason For Disposition Reached:   BP > 180/110  Advice Given:  N/A  Patient Will Follow Care Advice:  YES

## 2014-01-06 ENCOUNTER — Other Ambulatory Visit (INDEPENDENT_AMBULATORY_CARE_PROVIDER_SITE_OTHER): Payer: Medicare Other

## 2014-01-06 ENCOUNTER — Ambulatory Visit (INDEPENDENT_AMBULATORY_CARE_PROVIDER_SITE_OTHER): Payer: Medicare Other | Admitting: Internal Medicine

## 2014-01-06 ENCOUNTER — Encounter: Payer: Self-pay | Admitting: Internal Medicine

## 2014-01-06 ENCOUNTER — Telehealth: Payer: Self-pay | Admitting: Internal Medicine

## 2014-01-06 VITALS — BP 200/100 | HR 70 | Temp 97.0°F | Ht 63.0 in | Wt 137.0 lb

## 2014-01-06 DIAGNOSIS — I10 Essential (primary) hypertension: Secondary | ICD-10-CM

## 2014-01-06 LAB — CBC WITH DIFFERENTIAL/PLATELET
Basophils Absolute: 0 10*3/uL (ref 0.0–0.1)
Basophils Relative: 0.4 % (ref 0.0–3.0)
EOS ABS: 0.1 10*3/uL (ref 0.0–0.7)
Eosinophils Relative: 1.7 % (ref 0.0–5.0)
HEMATOCRIT: 42.1 % (ref 36.0–46.0)
Hemoglobin: 14.2 g/dL (ref 12.0–15.0)
Lymphocytes Relative: 21.7 % (ref 12.0–46.0)
Lymphs Abs: 1.3 10*3/uL (ref 0.7–4.0)
MCHC: 33.8 g/dL (ref 30.0–36.0)
MCV: 89 fl (ref 78.0–100.0)
Monocytes Absolute: 0.4 10*3/uL (ref 0.1–1.0)
Monocytes Relative: 6.7 % (ref 3.0–12.0)
NEUTROS PCT: 69.5 % (ref 43.0–77.0)
Neutro Abs: 4.1 10*3/uL (ref 1.4–7.7)
PLATELETS: 251 10*3/uL (ref 150.0–400.0)
RBC: 4.73 Mil/uL (ref 3.87–5.11)
RDW: 12.9 % (ref 11.5–15.5)
WBC: 5.9 10*3/uL (ref 4.0–10.5)

## 2014-01-06 LAB — LIPID PANEL
CHOLESTEROL: 240 mg/dL — AB (ref 0–200)
HDL: 57.5 mg/dL (ref >39.00)
LDL CALC: 154 mg/dL — AB (ref 0–99)
Total CHOL/HDL Ratio: 4
Triglycerides: 142 mg/dL (ref 0.0–149.0)
VLDL: 28.4 mg/dL (ref 0.0–40.0)

## 2014-01-06 LAB — URINALYSIS, ROUTINE W REFLEX MICROSCOPIC
Bilirubin Urine: NEGATIVE
HGB URINE DIPSTICK: NEGATIVE
Ketones, ur: NEGATIVE
LEUKOCYTES UA: NEGATIVE
NITRITE: NEGATIVE
PH: 7 (ref 5.0–8.0)
RBC / HPF: NONE SEEN (ref 0–?)
Specific Gravity, Urine: 1.015 (ref 1.000–1.030)
Total Protein, Urine: NEGATIVE
Urine Glucose: NEGATIVE
Urobilinogen, UA: 0.2 (ref 0.0–1.0)

## 2014-01-06 LAB — HEPATIC FUNCTION PANEL
ALK PHOS: 53 U/L (ref 39–117)
ALT: 13 U/L (ref 0–35)
AST: 19 U/L (ref 0–37)
Albumin: 4 g/dL (ref 3.5–5.2)
BILIRUBIN DIRECT: 0.1 mg/dL (ref 0.0–0.3)
BILIRUBIN TOTAL: 0.6 mg/dL (ref 0.2–1.2)
Total Protein: 6.9 g/dL (ref 6.0–8.3)

## 2014-01-06 LAB — BASIC METABOLIC PANEL
BUN: 18 mg/dL (ref 6–23)
CHLORIDE: 100 meq/L (ref 96–112)
CO2: 31 mEq/L (ref 19–32)
Calcium: 9.8 mg/dL (ref 8.4–10.5)
Creatinine, Ser: 0.9 mg/dL (ref 0.4–1.2)
GFR: 65.76 mL/min (ref >60.00)
Glucose, Bld: 90 mg/dL (ref 70–99)
POTASSIUM: 3.6 meq/L (ref 3.5–5.1)
SODIUM: 139 meq/L (ref 135–145)

## 2014-01-06 MED ORDER — HYDROCHLOROTHIAZIDE 12.5 MG PO CAPS: 12.5000 mg | ORAL_CAPSULE | Freq: Every day | ORAL | Status: DC

## 2014-01-06 MED ORDER — LABETALOL HCL 200 MG PO TABS: 200.0000 mg | ORAL_TABLET | Freq: Two times a day (BID) | ORAL | Status: DC

## 2014-01-06 MED ORDER — AMPHETAMINE-DEXTROAMPHETAMINE 10 MG PO TABS: 10.0000 mg | ORAL_TABLET | Freq: Two times a day (BID) | ORAL | Status: DC

## 2014-01-06 MED ORDER — LISINOPRIL 40 MG PO TABS: 40.0000 mg | ORAL_TABLET | Freq: Every day | ORAL | Status: DC

## 2014-01-06 MED ORDER — HYDROCODONE-ACETAMINOPHEN 5-325 MG PO TABS: 1.0000 | ORAL_TABLET | Freq: Four times a day (QID) | ORAL | Status: DC | PRN

## 2014-01-06 NOTE — Progress Notes (Signed)
Pre visit review using our clinic review tool, if applicable. No additional management support is needed unless otherwise documented below in the visit note. 

## 2014-01-06 NOTE — Patient Instructions (Signed)
Your EKG was Ok today  OK to stop the norvasc (amlodipine)  Please take all new medication as prescribed- the labetolol 200 mg twice per day, and the fluid pill  Please continue all other medications as before, including the lisinopril 40 mg, and the refills of the adderall and the hydrocodone  Please have the pharmacy call with any other refills you may need.  Please go to the LAB in the Basement (turn left off the elevator) for the tests to be done today  You will be contacted by phone if any changes need to be made immediately.  Otherwise, you will receive a letter about your results with an explanation, but please check with MyChart first.  You will be contacted regarding the referral for: ultrasound to check for kidney artery blockage  Please return in 1 week, or sooner if needed

## 2014-01-06 NOTE — Telephone Encounter (Signed)
Relevant patient education assigned to patient using Emmi. ° °

## 2014-01-06 NOTE — Progress Notes (Signed)
Subjective:    Patient ID: Alyssa Fernandez, female    DOB: 04/13/1941, 73 y.o.   MRN: [id number]  HPI  Here to f/u; overall doing ok,  Pt denies chest pain, increased sob or doe, wheezing, orthopnea, PND, increased LE swelling, palpitations, dizziness or syncope.  Pt denies polydipsia, polyuria, or low sugar symptoms such as weakness or confusion improved with po intake.   Pt states overall good compliance with meds. But BP has been whereas often about 150 sbp, but yest at dental with 226/105. Has known hx of polycystic kidney dz, no prior elev cr. No protein on UA oct 2014.  Only takes the adderall prn, usually with driving with hx of narcolepsy.  Pt continues to have recurring LBP without change in severity, some radiation to bilat buttocks, but no bowel or bladder change, fever, wt loss,  worsening LE pain/numbness/weakness, gait change or falls, despite her hx of neuropathy.  Legs can hurt worse with walking more than 2 miles, tries to walk 3 times per wk, also goes to the gyms several days with a trainer (usually noncardio at the gym except for stat bike). Mother with hx of stroke, noncompliant with meds, except for xanax. Pt with echo July 2013, normal EF.  BP has been severely high in the past as well, on 4 meds prior to fairly recent liver abscess episode Past Medical History  Diagnosis Date  . Hyperlipidemia   . Hypertension   . Polycystic kidney disease   . Anxiety   . RENAL FAILURE   . 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 HYPOTHYROIDISM   . GERD   . VITAMIN D DEFICIENCY   . OSTEOPENIA   . INSOMNIA   . 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 Partial nephrectomy    . Abdominal hysterectomy    . Cervical disc surgery    . Bunionectomy    . Fx rt ankle    . Left temporal bx artery-negative 2     . Bladder operation, but per sling procedure    . 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 Percutaneous  drainage of liver abscess  05/06/2012    reports that she has never smoked. She has never used smokeless tobacco. She reports that she does not drink alcohol or use illicit drugs. family history includes Stroke in her mother. Allergies  Allergen Reactions  . Codeine   . Sulfonamide Derivatives    Current Outpatient Prescriptions on File Prior to Visit  Medication Sig Dispense Refill  . ALPRAZolam (XANAX) 0.5 MG tablet TAKE ONE TABLET THREE TIMES DAILY AS NEEDED  90 tablet  2  . amLODipine (NORVASC) 5 MG tablet Take 1 tablet (5 mg total) by mouth daily.  90 tablet  3  . amphetamine-dextroamphetamine (ADDERALL) 10 MG tablet Take 1 tablet (10 mg total) by mouth 2 (two) times daily.  60 tablet  0  . calcium carbonate (OS-CAL) 1250 MG tablet Take 1 tablet by mouth daily.        Marland Kitchen DENAVIR 1 % cream AS DIRECTED  5 g  1  . estrogens, conjugated, (PREMARIN) 0.625 MG tablet Take 1 tablet (0.625 mg total) by mouth daily. Take daily for 21 days then do not take for 7 days.  63 tablet  1  . HYDROcodone-acetaminophen (NORCO/VICODIN) 5-325 MG per tablet Take 1 tablet by mouth every 6 (six) hours  as needed for pain.  120 tablet  2  . lisinopril (PRINIVIL,ZESTRIL) 40 MG tablet Take 1 tablet (40 mg total) by mouth daily.  90 tablet  3  . pantoprazole (PROTONIX) 40 MG tablet Take 1 tablet (40 mg total) by mouth daily.  90 tablet  3  . sertraline (ZOLOFT) 100 MG tablet TAKE ONE TABLET TWICE DAILY  60 tablet  6  . simvastatin (ZOCOR) 80 MG tablet TAKE ONE-HALF TABLET AT BEDTIME  30 tablet  11  . potassium chloride SA (K-DUR,KLOR-CON) 20 MEQ tablet TAKE ONE TABLET TWICE DAILY  60 tablet  11  . zolpidem (AMBIEN) 10 MG tablet TAKE ONE TABLET AT BEDTIME AS NEEDED FOR SLEEP  90 tablet  1  . ZOVIRAX 5 % cream APPLY TWICE DAILY  5 g  5   No current facility-administered medications on file prior to visit.   Review of Systems  Constitutional: Negative for unusual diaphoresis or other sweats  HENT: Negative for  ringing in ear Eyes: Negative for double vision or worsening visual disturbance.  Respiratory: Negative for choking and stridor.   Gastrointestinal: Negative for vomiting or other signifcant bowel change Genitourinary: Negative for hematuria or decreased urine volume.  Musculoskeletal: Negative for other MSK pain or swelling Skin: Negative for color change and worsening wound.  Neurological: Negative for tremors and numbness other than noted  Psychiatric/Behavioral: Negative for decreased concentration or agitation other than above       Objective:   Physical Exam BP 200/100  Pulse 70  Temp(Src) 97 F (36.1 C) (Oral)  Ht 5\' 3"  (1.6 m)  Wt 137 lb (62.143 kg)  BMI 24.27 kg/m2  SpO2 97% VS noted,  Constitutional: Pt appears well-developed, well-nourished.  HENT: Head: NCAT.  Right Ear: External ear normal.  Left Ear: External ear normal.  Eyes: . Pupils are equal, round, and reactive to light. Conjunctivae and EOM are normal Neck: Normal range of motion. Neck supple.  Cardiovascular: Normal rate and regular rhythm.   Pulmonary/Chest: Effort normal and breath sounds normal.  Abd:  Soft, NT, ND, + BS Neurological: Pt is alert. Not confused , motor grossly intact Skin: Skin is warm. No rash, trace bilat LE edema Psychiatric: Pt behavior is normal. No agitation.     Assessment & Plan:

## 2014-01-06 NOTE — Telephone Encounter (Signed)
Patient informed. 

## 2014-01-06 NOTE — Assessment & Plan Note (Signed)
Severe elevated, asympt, for ecg today and labs including UA for protein and bun/cr, but also for renal artery u/s/abd u/s, and r/o pheo labs; to stop norvasc in light of severity and trace edema today, cont lisinopril 40, add labetolol 200 bid, and hct 12.5; f/u 1 wk

## 2014-01-06 NOTE — Telephone Encounter (Signed)
hctz refilled

## 2014-01-07 ENCOUNTER — Telehealth: Payer: Self-pay | Admitting: Internal Medicine

## 2014-01-07 MED ORDER — ATORVASTATIN CALCIUM 20 MG PO TABS: 20.0000 mg | ORAL_TABLET | Freq: Every day | ORAL | Status: DC

## 2014-01-07 NOTE — Telephone Encounter (Signed)
Robin to let pt know - ok to stop the zocor 40 qhs, as does not appear to be working  To start lipitor 10 qd

## 2014-01-08 ENCOUNTER — Other Ambulatory Visit: Payer: Medicare Other

## 2014-01-08 DIAGNOSIS — I10 Essential (primary) hypertension: Secondary | ICD-10-CM

## 2014-01-08 NOTE — Telephone Encounter (Signed)
Called left message to call back 

## 2014-01-08 NOTE — Telephone Encounter (Signed)
Called informed the patient of change in medication.

## 2014-01-12 LAB — CATECHOLAMINES, FRACTIONATED, URINE, 24 HOUR
CREATININE, URINE MG/DAY-CATEUR: 1.08 g/(24.h) (ref 0.63–2.50)
Calculated Total (E+NE): 101 mcg/24 h (ref 26–121)
Dopamine, 24 hr Urine: 130 mcg/24 h (ref 52–480)
Norepinephrine, 24 hr Ur: 101 mcg/24 h — ABNORMAL HIGH (ref 15–100)
Total Volume - CF 24Hr U: 2300 mL

## 2014-01-13 ENCOUNTER — Encounter: Payer: Self-pay | Admitting: Internal Medicine

## 2014-01-13 ENCOUNTER — Ambulatory Visit (INDEPENDENT_AMBULATORY_CARE_PROVIDER_SITE_OTHER): Payer: Medicare Other | Admitting: Internal Medicine

## 2014-01-13 VITALS — BP 162/82 | HR 65 | Temp 98.0°F | Ht 63.0 in | Wt 138.2 lb

## 2014-01-13 DIAGNOSIS — E039 Hypothyroidism, unspecified: Secondary | ICD-10-CM

## 2014-01-13 DIAGNOSIS — I10 Essential (primary) hypertension: Secondary | ICD-10-CM

## 2014-01-13 DIAGNOSIS — E785 Hyperlipidemia, unspecified: Secondary | ICD-10-CM

## 2014-01-13 MED ORDER — LISINOPRIL-HYDROCHLOROTHIAZIDE 20-12.5 MG PO TABS: 2.0000 | ORAL_TABLET | Freq: Every day | ORAL | Status: DC

## 2014-01-13 MED ORDER — AMLODIPINE BESYLATE 10 MG PO TABS: 10.0000 mg | ORAL_TABLET | Freq: Every day | ORAL | Status: DC

## 2014-01-13 NOTE — Progress Notes (Signed)
Pre visit review using our clinic review tool, if applicable. No additional management support is needed unless otherwise documented below in the visit note. 

## 2014-01-13 NOTE — Patient Instructions (Signed)
OK to stop the lisinopril, and the hydrochlorothiazide  Please take all new medication as prescribed - the the lisinopril-HCT 20/12.5mg  (2 pills per day), and the amlodipine 10 mg per day  Please check your blood pressure regularly, with the goal being less than 140/90  Please continue all other medications as before, and refills have been done if requested. Please have the pharmacy call with any other refills you may need.  Please return in 3 wks, or sooner if needed  Please see [facility] regarding the abd u/s before leaving otday

## 2014-01-13 NOTE — Assessment & Plan Note (Signed)
Now started on liptior, tolerating ok,  to f/u any worsening symptoms or concerns, for lower chol diet

## 2014-01-13 NOTE — Assessment & Plan Note (Signed)
stable overall by history and exam, recent data reviewed with pt, and pt to continue medical treatment as before,  to f/u any worsening symptoms or concerns Lab Results  Component Value Date   TSH 1.04 05/22/2013

## 2014-01-13 NOTE — Assessment & Plan Note (Signed)
Improved but uncontrolled, to change lisin 40 and hct 12.5 to lis-hct 20/12.5 - 2 per day, and add back the amlod 10 qd, cont labetolol, check BP at local [facility] reguarly, and f/u here in 3 wks BP Readings from Last 3 Encounters:  01/13/14 162/82  01/06/14 200/100  05/22/13 152/82   ]

## 2014-01-13 NOTE — Progress Notes (Signed)
Subjective:    Patient ID: Alyssa Fernandez, female    DOB: 11/22/1940, 73 y.o.   MRN: [id number]  HPI  Here to f/u; overall doing ok,  Pt denies chest pain, increased sob or doe, wheezing, orthopnea, PND, increased LE swelling, palpitations, dizziness or syncope.  Pt denies polydipsia, polyuria, or low sugar symptoms such as weakness or confusion improved with po intake.  Pt denies new neurological symptoms such as new headache, or facial or extremity weakness or numbness.   Pt states overall good compliance with meds, has been trying to follow lower cholesterol diet, with wt overall stable,  but little exercise however. Going to the gym more often, has gained over 10 lbs in the past yr.  Admits to some increased salt and chol in diet recently.  Denies hyper or hypo thyroid symptoms such as voice, skin or hair change.  Tolerateing lipitor well so far without myalgias Past Medical History  Diagnosis Date  . Hyperlipidemia   . Hypertension   . Polycystic kidney disease   . Anxiety   . RENAL FAILURE   . 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 HYPOTHYROIDISM   . GERD   . VITAMIN D DEFICIENCY   . OSTEOPENIA   . INSOMNIA   . 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 Partial nephrectomy    . Abdominal hysterectomy    . Cervical disc surgery    . Bunionectomy    . Fx rt ankle    . Left temporal bx artery-negative 2     . Bladder operation, but per sling procedure    . 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 Percutaneous drainage of liver abscess  05/06/2012    reports that she has never smoked. She has never used smokeless tobacco. She reports that she does not drink alcohol or use illicit drugs. family history includes Stroke in her mother. Allergies  Allergen Reactions  . Codeine   . Sulfonamide Derivatives    Current Outpatient Prescriptions on File Prior to Visit  Medication Sig Dispense Refill  . ALPRAZolam  (XANAX) 0.5 MG tablet TAKE ONE TABLET THREE TIMES DAILY AS NEEDED  90 tablet  2  . amphetamine-dextroamphetamine (ADDERALL) 10 MG tablet Take 1 tablet (10 mg total) by mouth 2 (two) times daily.  60 tablet  0  . atorvastatin (LIPITOR) 20 MG tablet Take 1 tablet (20 mg total) by mouth daily.  90 tablet  3  . calcium carbonate (OS-CAL) 1250 MG tablet Take 1 tablet by mouth daily.        Marland Kitchen DENAVIR 1 % cream AS DIRECTED  5 g  1  . estrogens, conjugated, (PREMARIN) 0.625 MG tablet Take 1 tablet (0.625 mg total) by mouth daily. Take daily for 21 days then do not take for 7 days.  63 tablet  1  . hydrochlorothiazide (MICROZIDE) 12.5 MG capsule Take 1 capsule (12.5 mg total) by mouth daily.  90 capsule  3  . HYDROcodone-acetaminophen (NORCO/VICODIN) 5-325 MG per tablet Take 1 tablet by mouth every 6 (six) hours as needed.  120 tablet  0  . labetalol (NORMODYNE) 200 MG tablet Take 1 tablet (200 mg total) by mouth 2 (two) times daily.  180 tablet  3  . lisinopril (PRINIVIL,ZESTRIL) 40 MG tablet Take 1 tablet (40 mg total) by mouth daily.  90 tablet  3  . pantoprazole (PROTONIX)  40 MG tablet Take 1 tablet (40 mg total) by mouth daily.  90 tablet  3  . potassium chloride SA (K-DUR,KLOR-CON) 20 MEQ tablet TAKE ONE TABLET TWICE DAILY  60 tablet  11  . sertraline (ZOLOFT) 100 MG tablet TAKE ONE TABLET TWICE DAILY  60 tablet  6  . zolpidem (AMBIEN) 10 MG tablet TAKE ONE TABLET AT BEDTIME AS NEEDED FOR SLEEP  90 tablet  1  . ZOVIRAX 5 % cream APPLY TWICE DAILY  5 g  5   No current facility-administered medications on file prior to visit.   Review of Systems  Constitutional: Negative for unusual diaphoresis or other sweats  HENT: Negative for ringing in ear Eyes: Negative for double vision or worsening visual disturbance.  Respiratory: Negative for choking and stridor.   Gastrointestinal: Negative for vomiting or other signifcant bowel change Genitourinary: Negative for hematuria or decreased urine volume.    Musculoskeletal: Negative for other MSK pain or swelling Skin: Negative for color change and worsening wound.  Neurological: Negative for tremors and numbness other than noted  Psychiatric/Behavioral: Negative for decreased concentration or agitation other than above       Objective:   Physical Exam BP 162/82  Pulse 65  Temp(Src) 98 F (36.7 C) (Oral)  Ht 5\' 3"  (1.6 m)  Wt 138 lb 4 oz (62.71 kg)  BMI 24.50 kg/m2  SpO2 92% VS noted,  Constitutional: Pt appears well-developed, well-nourished.  HENT: Head: NCAT.  Right Ear: External ear normal.  Left Ear: External ear normal.  Eyes: . Pupils are equal, round, and reactive to light. Conjunctivae and EOM are normal Neck: Normal range of motion. Neck supple.  Cardiovascular: Normal rate and regular rhythm.   Pulmonary/Chest: Effort normal and breath sounds normal.  Abd:  Soft, NT, ND, + BS Neurological: Pt is alert. Not confused , motor grossly intact Skin: Skin is warm. No rash Psychiatric: Pt behavior is normal. No agitation. Not depressed affect    Assessment & Plan:

## 2014-01-14 ENCOUNTER — Other Ambulatory Visit: Payer: Self-pay | Admitting: Internal Medicine

## 2014-01-14 DIAGNOSIS — I10 Essential (primary) hypertension: Secondary | ICD-10-CM

## 2014-01-14 LAB — METANEPHRINES, URINE, 24 HOUR
METANEPHRINES UR: 64 ug/(24.h) — AB (ref 90–315)
Metaneph Total, Ur: 387 mcg/24 h (ref 224–832)
NORMETANEPHRINE 24H UR: 323 ug/(24.h) (ref 122–676)

## 2014-01-21 ENCOUNTER — Ambulatory Visit
Admission: RE | Admit: 2014-01-21 | Discharge: 2014-01-21 | Disposition: A | Discharge status: Home / Self Care | Payer: Medicare Other | Source: Ambulatory Visit | Attending: Internal Medicine | Admitting: Internal Medicine

## 2014-01-21 DIAGNOSIS — I10 Essential (primary) hypertension: Secondary | ICD-10-CM

## 2014-01-24 ENCOUNTER — Other Ambulatory Visit: Payer: Self-pay | Admitting: Internal Medicine

## 2014-01-27 MED ORDER — ALPRAZOLAM 0.5 MG PO TABS: ORAL_TABLET | ORAL | Status: DC

## 2014-01-27 NOTE — Telephone Encounter (Signed)
Done hardcopy to [facility]bin  

## 2014-01-27 NOTE — Telephone Encounter (Signed)
Faxed hard copy to [facility].  

## 2014-02-03 ENCOUNTER — Encounter: Payer: Self-pay | Admitting: Internal Medicine

## 2014-02-03 ENCOUNTER — Ambulatory Visit (INDEPENDENT_AMBULATORY_CARE_PROVIDER_SITE_OTHER): Payer: Medicare Other | Admitting: Internal Medicine

## 2014-02-03 VITALS — BP 98/62 | HR 67 | Temp 98.2°F | Wt 137.5 lb

## 2014-02-03 DIAGNOSIS — R0609 Other forms of dyspnea: Secondary | ICD-10-CM

## 2014-02-03 DIAGNOSIS — I10 Essential (primary) hypertension: Secondary | ICD-10-CM

## 2014-02-03 DIAGNOSIS — R0989 Other specified symptoms and signs involving the circulatory and respiratory systems: Secondary | ICD-10-CM

## 2014-02-03 DIAGNOSIS — R0600 Dyspnea, unspecified: Secondary | ICD-10-CM

## 2014-02-03 DIAGNOSIS — R609 Edema, unspecified: Secondary | ICD-10-CM

## 2014-02-03 HISTORY — DX: Dyspnea, unspecified: R06.00

## 2014-02-03 NOTE — Assessment & Plan Note (Signed)
Likely overcontrolled, to d/c amlodipine 10 qd, cont all other meds, neg u/s for ras d/w pt

## 2014-02-03 NOTE — Patient Instructions (Signed)
OK to stop the amlodipine (norvasc) 10 mg per day  Please continue all other medications as before, and refills have been done if requested.  Please have the pharmacy call with any other refills you may need.  Please continue your efforts at being more active, low cholesterol diet, and weight control.  Please keep your appointments with your specialists as you may have planned  Please return in 6 months, or sooner if needed

## 2014-02-03 NOTE — Progress Notes (Signed)
Pre visit review using our clinic review tool, if applicable. No additional management support is needed unless otherwise documented below in the visit note. 

## 2014-02-03 NOTE — Progress Notes (Signed)
Subjective:    Patient ID: Alyssa Fernandez, female    DOB: [birth year], 73 y.o.   MRN: [id number]  HPI  Here to f/u BP, overall doing well, no complaints except for fatigue/sleepiness last few days on 4 meds, also some trace swelling to distal LE's.  Pt denies chest pain, increased sob or doe, wheezing, orthopnea, PND, increased LE swelling, palpitations, dizziness or syncope, except did have an episode getting out of the car in the heat to go to the smoothie store after being at the gym with a few minutes of sudden sob, hard to catch her breath, last 2-3 min possibly, resolved, no recurrence, seemed better with going into the cool store.   Pt denies new neurological symptoms such as new headache, or facial or extremity weakness or numbness  Recent abd u/s neg for RAS.Denies worsening depressive symptoms, suicidal ideation, or panic; has ongoing anxiety, not increased recently Past Medical History  Diagnosis Date  . Hyperlipidemia   . Hypertension   . Polycystic kidney disease   . Anxiety   . RENAL FAILURE   . 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 HYPOTHYROIDISM   . GERD   . VITAMIN D DEFICIENCY   . OSTEOPENIA   . INSOMNIA   . 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 Partial nephrectomy    . Abdominal hysterectomy    . Cervical disc surgery    . Bunionectomy    . Fx rt ankle    . Left temporal bx artery-negative 2     . Bladder operation, but per sling procedure    . 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 Percutaneous drainage of liver abscess  05/06/2012    reports that she has never smoked. She has never used smokeless tobacco. She reports that she does not drink alcohol or use illicit drugs. family history includes Stroke in her mother. Allergies  Allergen Reactions  . Codeine   . Sulfonamide Derivatives    Current Outpatient Prescriptions on File Prior to Visit  Medication Sig Dispense Refill  .  ALPRAZolam (XANAX) 0.5 MG tablet TAKE ONE TABLET THREE TIMES DAILY AS NEEDED  90 tablet  2  . amphetamine-dextroamphetamine (ADDERALL) 10 MG tablet Take 1 tablet (10 mg total) by mouth 2 (two) times daily.  60 tablet  0  . atorvastatin (LIPITOR) 20 MG tablet Take 1 tablet (20 mg total) by mouth daily.  90 tablet  3  . calcium carbonate (OS-CAL) 1250 MG tablet Take 1 tablet by mouth daily.        Marland Kitchen DENAVIR 1 % cream AS DIRECTED  5 g  1  . HYDROcodone-acetaminophen (NORCO/VICODIN) 5-325 MG per tablet Take 1 tablet by mouth every 6 (six) hours as needed.  120 tablet  0  . labetalol (NORMODYNE) 200 MG tablet Take 1 tablet (200 mg total) by mouth 2 (two) times daily.  180 tablet  3  . lisinopril-hydrochlorothiazide (PRINZIDE,ZESTORETIC) 20-12.5 MG per tablet Take 2 tablets by mouth daily.  180 tablet  3  . pantoprazole (PROTONIX) 40 MG tablet Take 1 tablet (40 mg total) by mouth daily.  90 tablet  3  . potassium chloride SA (K-DUR,KLOR-CON) 20 MEQ tablet TAKE ONE TABLET TWICE DAILY  60 tablet  11  . PREMARIN 0.625 MG tablet TAKE DAILY FOR 21 DAYS THEN DO NOT TAKE FOR 7 DAYS  63 tablet  0  . sertraline (ZOLOFT) 100 MG tablet TAKE ONE TABLET TWICE DAILY  60 tablet  5  . zolpidem (AMBIEN) 10 MG tablet TAKE ONE TABLET AT BEDTIME AS NEEDED FOR SLEEP  90 tablet  1   No current facility-administered medications on file prior to visit.    Review of Systems  Constitutional: Negative for unusual diaphoresis or other sweats  HENT: Negative for ringing in ear Eyes: Negative for double vision or worsening visual disturbance.  Respiratory: Negative for choking and stridor.   Gastrointestinal: Negative for vomiting or other signifcant bowel change Genitourinary: Negative for hematuria or decreased urine volume.  Musculoskeletal: Negative for other MSK pain or swelling Skin: Negative for color change and worsening wound.  Neurological: Negative for tremors and numbness other than noted  Psychiatric/Behavioral:  Negative for decreased concentration or agitation other than above        Objective:   Physical Exam BP 98/62  Pulse 67  Temp(Src) 98.2 F (36.8 C) (Oral)  Wt 137 lb 8 oz (62.37 kg)  SpO2 97% VS noted,  Constitutional: Pt appears well-developed, well-nourished.  HENT: Head: NCAT.  Right Ear: External ear normal.  Left Ear: External ear normal.  Eyes: . Pupils are equal, round, and reactive to light. Conjunctivae and EOM are normal Neck: Normal range of motion. Neck supple.  Cardiovascular: Normal rate and regular rhythm.   Pulmonary/Chest: Effort normal and breath sounds normal.  Neurological: Pt is alert. Not confused , motor grossly intact Skin: Skin is warm. No rash, trace puffiness to both legs today below knees Psychiatric: Pt behavior is normal. No agitation. mild nervous    Assessment & Plan:

## 2014-02-03 NOTE — Assessment & Plan Note (Signed)
?   Amlodipine related - to d/c amlodipine as above, follow

## 2014-02-03 NOTE — Assessment & Plan Note (Signed)
One breif episode in the hot weather last wk, lasted few minutes, resolved, not recurred, exam benign, etiology unclear,  to f/u any worsening symptoms or concerns

## 2014-04-08 ENCOUNTER — Ambulatory Visit (INDEPENDENT_AMBULATORY_CARE_PROVIDER_SITE_OTHER): Payer: Medicare Other | Admitting: Internal Medicine

## 2014-04-08 ENCOUNTER — Encounter: Payer: Self-pay | Admitting: Internal Medicine

## 2014-04-08 VITALS — BP 172/92 | HR 62 | Temp 98.3°F | Wt 135.5 lb

## 2014-04-08 DIAGNOSIS — L509 Urticaria, unspecified: Secondary | ICD-10-CM

## 2014-04-08 DIAGNOSIS — Z23 Encounter for immunization: Secondary | ICD-10-CM

## 2014-04-08 DIAGNOSIS — F411 Generalized anxiety disorder: Secondary | ICD-10-CM

## 2014-04-08 DIAGNOSIS — F419 Anxiety disorder, unspecified: Secondary | ICD-10-CM

## 2014-04-08 HISTORY — DX: Urticaria, unspecified: L50.9

## 2014-04-08 MED ORDER — AMLODIPINE BESYLATE 5 MG PO TABS: 5.0000 mg | ORAL_TABLET | Freq: Every day | ORAL | Status: DC

## 2014-04-08 MED ORDER — HYDROCHLOROTHIAZIDE 12.5 MG PO TABS: 12.5000 mg | ORAL_TABLET | Freq: Every day | ORAL | Status: DC

## 2014-04-08 MED ORDER — METHYLPREDNISOLONE ACETATE 80 MG/ML IJ SUSP
80.0000 mg | Freq: Once | INTRAMUSCULAR | Status: AC
  Administered 2014-04-08: 80 mg via INTRAMUSCULAR

## 2014-04-08 NOTE — Assessment & Plan Note (Signed)
stable overall by history and exam,  and pt to continue medical treatment as before,  to f/u any worsening symptoms or concerns, delcines need for counseling

## 2014-04-08 NOTE — Progress Notes (Signed)
   Subjective:    Patient ID: Alyssa Fernandez, female    DOB: 1941-07-02, 73 y.o.   MRN: [id number]  HPI   Here with co rash x 2 wks, hive liek with itch, improved breifly with benadryl, but always seems to come back.  Pt denies chest pain, increased sob or doe, wheezing, orthopnea, PND, increased LE swelling, palpitations, dizziness or syncope.  No lip or tongue swelling.  Has been ACE for at least one yr.  Takes her BP daily with wrist cuff at home (has arm cuff but harder to use) and has had elev bp x 2 wks (143/110 sometimes or less) despite good med compliance.  Pt denies new neurological symptoms such as new headache, or facial or extremity weakness or numbness   Pt denies polydipsia, polyuria, Has f/u appt oct 2015.  Husband chronic ill, lots of stress for her. Denies worsening depressive symptoms, suicidal ideation, or panic  BP Readings from Last 3 Encounters:  04/08/14 172/92  02/03/14 98/62  01/13/14 162/82    Review of Systems  Constitutional: Negative for unusual diaphoresis or other sweats  HENT: Negative for ringing in ear Eyes: Negative for double vision or worsening visual disturbance.  Respiratory: Negative for choking and stridor.   Gastrointestinal: Negative for vomiting or other signifcant bowel change Genitourinary: Negative for hematuria or decreased urine volume.  Musculoskeletal: Negative for other MSK pain or swelling Skin: Negative for color change and worsening wound.  Neurological: Negative for tremors and numbness other than noted  Psychiatric/Behavioral: Negative for decreased concentration or agitation other than above       Objective:   Physical Exam BP 172/92  Pulse 62  Temp(Src) 98.3 F (36.8 C) (Oral)  Wt 135 lb 8 oz (61.462 kg)  SpO2 96% VS noted,  Constitutional: Pt appears well-developed, well-nourished.  HENT: Head: NCAT.  Right Ear: External ear normal.  Left Ear: External ear normal.  Eyes: . Pupils are equal, round, and reactive to light.  Conjunctivae and EOM are normal Neck: Normal range of motion. Neck supple.  Cardiovascular: Normal rate and regular rhythm.   Pulmonary/Chest: Effort normal and breath sounds normal.  Abd:  Soft, NT, ND, + BS Neurological: Pt is alert. Not confused , motor grossly intact Skin: Skin is warm. With few hive like lesions to arms and abdomen, nontender, raised, itchy Psychiatric: Pt behavior is normal. No agitation. mild nervous only appearing    Assessment & Plan:

## 2014-04-08 NOTE — Progress Notes (Signed)
Pre visit review using our clinic review tool, if applicable. No additional management support is needed unless otherwise documented below in the visit note. 

## 2014-04-08 NOTE — Patient Instructions (Signed)
You had the steroid shot today, and the flu shot  OK to stop the lisinopril-HCT  Please take all new medication as prescribed - the HCT (fluid pill) at 12.5 mg per day, and the amlodipine 5 mg per day  You can also take OTC benadryl 50 mg every 6 hrs as needed for rash  Please continue all other medications as before, and refills have been done if requested.  Please have the pharmacy call with any other refills you may need.  Please keep your appointments with your specialists as you may have planned  Please continue to monitor your blood pressure as you do, but remember the amlodipine part takes up to 4 wks to get the full effect; your goal is to be less than 140/90

## 2014-04-08 NOTE — Assessment & Plan Note (Signed)
Etiology unclear, but cant r/o ACE;  For depomedrol IM today, d/c ACE

## 2014-04-09 ENCOUNTER — Ambulatory Visit: Payer: Medicare Other | Admitting: Internal Medicine

## 2014-04-21 ENCOUNTER — Other Ambulatory Visit: Payer: Self-pay | Admitting: Internal Medicine

## 2014-05-26 ENCOUNTER — Encounter: Payer: Self-pay | Admitting: Internal Medicine

## 2014-05-26 ENCOUNTER — Other Ambulatory Visit (INDEPENDENT_AMBULATORY_CARE_PROVIDER_SITE_OTHER): Payer: Medicare Other

## 2014-05-26 ENCOUNTER — Ambulatory Visit (INDEPENDENT_AMBULATORY_CARE_PROVIDER_SITE_OTHER): Payer: Medicare Other | Admitting: Internal Medicine

## 2014-05-26 VITALS — BP 140/86 | HR 54 | Temp 97.7°F | Ht 63.0 in | Wt 134.0 lb

## 2014-05-26 DIAGNOSIS — I10 Essential (primary) hypertension: Secondary | ICD-10-CM

## 2014-05-26 DIAGNOSIS — Z0000 Encounter for general adult medical examination without abnormal findings: Secondary | ICD-10-CM

## 2014-05-26 DIAGNOSIS — M1611 Unilateral primary osteoarthritis, right hip: Secondary | ICD-10-CM

## 2014-05-26 DIAGNOSIS — M1990 Unspecified osteoarthritis, unspecified site: Secondary | ICD-10-CM | POA: Insufficient documentation

## 2014-05-26 DIAGNOSIS — E785 Hyperlipidemia, unspecified: Secondary | ICD-10-CM

## 2014-05-26 HISTORY — DX: Unspecified osteoarthritis, unspecified site: M19.90

## 2014-05-26 LAB — CBC WITH DIFFERENTIAL/PLATELET
BASOS ABS: 0 10*3/uL (ref 0.0–0.1)
BASOS PCT: 0.4 % (ref 0.0–3.0)
EOS ABS: 0.4 10*3/uL (ref 0.0–0.7)
Eosinophils Relative: 7.3 % — ABNORMAL HIGH (ref 0.0–5.0)
HCT: 40.1 % (ref 36.0–46.0)
Hemoglobin: 13.6 g/dL (ref 12.0–15.0)
LYMPHS PCT: 20.1 % (ref 12.0–46.0)
Lymphs Abs: 1.2 10*3/uL (ref 0.7–4.0)
MCHC: 34 g/dL (ref 30.0–36.0)
MCV: 91.2 fl (ref 78.0–100.0)
Monocytes Absolute: 0.5 10*3/uL (ref 0.1–1.0)
Monocytes Relative: 9.2 % (ref 3.0–12.0)
Neutro Abs: 3.8 10*3/uL (ref 1.4–7.7)
Neutrophils Relative %: 63 % (ref 43.0–77.0)
Platelets: 233 10*3/uL (ref 150.0–400.0)
RBC: 4.4 Mil/uL (ref 3.87–5.11)
RDW: 13.1 % (ref 11.5–15.5)
WBC: 6 10*3/uL (ref 4.0–10.5)

## 2014-05-26 LAB — URINALYSIS, ROUTINE W REFLEX MICROSCOPIC
Bilirubin Urine: NEGATIVE
HGB URINE DIPSTICK: NEGATIVE
Ketones, ur: NEGATIVE
Leukocytes, UA: NEGATIVE
NITRITE: NEGATIVE
RBC / HPF: NONE SEEN (ref 0–?)
Specific Gravity, Urine: 1.015 (ref 1.000–1.030)
Total Protein, Urine: NEGATIVE
UROBILINOGEN UA: 0.2 (ref 0.0–1.0)
Urine Glucose: NEGATIVE
pH: 6.5 (ref 5.0–8.0)

## 2014-05-26 MED ORDER — AMPHETAMINE-DEXTROAMPHETAMINE 10 MG PO TABS: 10.0000 mg | ORAL_TABLET | Freq: Two times a day (BID) | ORAL | Status: DC

## 2014-05-26 MED ORDER — HYDROCODONE-ACETAMINOPHEN 5-325 MG PO TABS: 1.0000 | ORAL_TABLET | Freq: Four times a day (QID) | ORAL | Status: DC | PRN

## 2014-05-26 MED ORDER — PANTOPRAZOLE SODIUM 40 MG PO TBEC: 40.0000 mg | DELAYED_RELEASE_TABLET | Freq: Every day | ORAL | Status: DC

## 2014-05-26 NOTE — Assessment & Plan Note (Signed)
?   overtreated at times, BP somewhat labile by home measures, pt to cont as is for now and cont to monitor for lower BP ? Assoc with fatigue, weakness

## 2014-05-26 NOTE — Patient Instructions (Addendum)
Please continue all other medications as before, and refills have been done if requested.  Please have the pharmacy call with any other refills you may need.  Please continue your efforts at being more active, low cholesterol diet, and weight control.  You are otherwise up to date with prevention measures today.  Please keep your appointments with your specialists as you may have planned  You will be contacted regarding the referral for: mammogram  Please go to the LAB in the Basement (turn left off the elevator) for the tests to be done today  You will be contacted by phone if any changes need to be made immediately.  Otherwise, you will receive a letter about your results with an explanation, but please check with MyChart first.  Please remember to sign up for MyChart if you have not done so, as this will be important to you in the future with finding out test results, communicating by private email, and scheduling acute appointments online when needed.  Please return in 6 months, or sooner if needed 

## 2014-05-26 NOTE — Progress Notes (Signed)
Subjective:    Patient ID: Alyssa Fernandez, female    DOB: 02/18/1941, 73 y.o.   MRN: [id number]  HPI  Here for wellness and f/u;  Overall doing ok;  Pt denies CP, worsening SOB, DOE, wheezing, orthopnea, PND, worsening LE edema, palpitations, dizziness or syncope.  Pt denies neurological change such as new headache, facial or extremity weakness.  Pt denies polydipsia, polyuria, or low sugar symptoms. Pt states overall good compliance with treatment and medications, good tolerability, and has been trying to follow lower cholesterol diet.  Pt denies worsening depressive symptoms, suicidal ideation or panic. No fever, night sweats, wt loss, loss of appetite, or other constitutional symptoms.  Pt states good ability with ADL's, has low fall risk, home safety reviewed and adequate, no other significant changes in hearing or vision, and only occasionally active with exercise. Ave BP over 9 days prior to visit - 137/83.  Has significant weakness and fatigue with BP less than 120/80, but not necessarily when BP is higher.  Has occas cramps to hands and legs. Sees [facility] [facility] for right shoulder rot cuff issue. Has a sensation like "blood rushing up" or "fullness" to the left post neck and head, and sometimes radiates both temples, chronic for some time., has seen neuro in the past.. Also recurrent soreness to the right lower neck on occasion, has known TMJ as well, worse on the right, eats soft foods only, chronic.  No recent narcolepsy episidoes on the adderal.  Chronic pain overall controlled, needs refill.  Also with dry cough, worse at night, ? Allgergies vs other.  Past Medical History  Diagnosis Date  . Hyperlipidemia   . Hypertension   . Polycystic kidney disease   . Anxiety   . RENAL FAILURE   . 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 HYPOTHYROIDISM   . GERD   . VITAMIN D DEFICIENCY   . OSTEOPENIA   . INSOMNIA   . Cervical spine degeneration     Severe by CT April 2012   Past Surgical  History  Procedure Laterality Date  . Partial nephrectomy    . Abdominal hysterectomy    . Cervical disc surgery    . Bunionectomy    . Fx rt ankle    . Left temporal bx artery-negative 2     . Bladder operation, but per sling procedure    . 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 Percutaneous drainage of liver abscess  05/06/2012    reports that she has never smoked. She has never used smokeless tobacco. She reports that she does not drink alcohol or use illicit drugs. family history includes Stroke in her mother. Allergies  Allergen Reactions  . Codeine   . Sulfonamide Derivatives    Current Outpatient Prescriptions on File Prior to Visit  Medication Sig Dispense Refill  . amLODipine (NORVASC) 5 MG tablet Take 1 tablet (5 mg total) by mouth daily.  90 tablet  3  . amphetamine-dextroamphetamine (ADDERALL) 10 MG tablet Take 1 tablet (10 mg total) by mouth 2 (two) times daily.  60 tablet  0  . atorvastatin (LIPITOR) 20 MG tablet Take 1 tablet (20 mg total) by mouth daily.  90 tablet  3  . calcium carbonate (OS-CAL) 1250 MG tablet Take 1 tablet by mouth daily.        Marland Kitchen DENAVIR 1 % cream AS DIRECTED  5 g  1  . hydrochlorothiazide (HYDRODIURIL) 12.5 MG tablet Take 1 tablet (12.5 mg  total) by mouth daily.  90 tablet  3  . HYDROcodone-acetaminophen (NORCO/VICODIN) 5-325 MG per tablet Take 1 tablet by mouth every 6 (six) hours as needed.  120 tablet  0  . labetalol (NORMODYNE) 200 MG tablet Take 1 tablet (200 mg total) by mouth 2 (two) times daily.  180 tablet  3  . potassium chloride SA (K-DUR,KLOR-CON) 20 MEQ tablet TAKE ONE TABLET TWICE DAILY  60 tablet  11  . PREMARIN 0.625 MG tablet TAKE 1 TABLET DAILY FOR 21 DAYS THEN DO NOT TAKE FOR 7 DAYS  63 tablet  0  . sertraline (ZOLOFT) 100 MG tablet TAKE ONE TABLET TWICE DAILY  60 tablet  5   No current facility-administered medications on file prior to visit.    Review of Systems Constitutional: Negative for increased diaphoresis, other activity,  appetite or other siginficant weight change  HENT: Negative for worsening hearing loss, ear pain, facial swelling, mouth sores and neck stiffness.   Eyes: Negative for other worsening pain, redness or visual disturbance.  Respiratory: Negative for shortness of breath and wheezing.   Cardiovascular: Negative for chest pain and palpitations.  Gastrointestinal: Negative for diarrhea, blood in stool, abdominal distention or other pain Genitourinary: Negative for hematuria, flank pain or change in urine volume.  Musculoskeletal: Negative for myalgias or other joint complaints.  Skin: Negative for color change and wound.  Neurological: Negative for syncope and numbness. other than noted Hematological: Negative for adenopathy. or other swelling Psychiatric/Behavioral: Negative for hallucinations, self-injury, decreased concentration or other worsening agitation.      Objective:   Physical Exam BP 140/86  Pulse 54  Temp(Src) 97.7 F (36.5 C) (Oral)  Ht 5\' 3"  (1.6 m)  Wt 134 lb (60.782 kg)  BMI 23.74 kg/m2  SpO2 97% VS noted,  Constitutional: Pt is oriented to person, place, and time. Appears well-developed and well-nourished.  Head: Normocephalic and atraumatic.  Right Ear: External ear normal.  Left Ear: External ear normal.  Nose: Nose normal.  Mouth/Throat: Oropharynx is clear and moist.  Eyes: Conjunctivae and EOM are normal. Pupils are equal, round, and reactive to light.  Neck: Normal range of motion. Neck supple. No JVD present. No tracheal deviation present.  Cardiovascular: Normal rate, regular rhythm, normal heart sounds and intact distal pulses.   Pulmonary/Chest: Effort normal and breath sounds without rales or wheezing  Abdominal: Soft. Bowel sounds are normal. NT. No HSM  Musculoskeletal: Normal range of motion. Exhibits no edema.  Lymphadenopathy:  Has no cervical adenopathy.  Neurological: Pt is alert and oriented to person, place, and time. Pt has normal reflexes. No  cranial nerve deficit. Motor grossly intact Skin: Skin is warm and dry. No rash noted.  Psychiatric:  Has normal mood and affect. Behavior is normal.     Assessment & Plan:

## 2014-05-26 NOTE — Progress Notes (Signed)
Pre visit review using our clinic review tool, if applicable. No additional management support is needed unless otherwise documented below in the visit note. 

## 2014-05-26 NOTE — Assessment & Plan Note (Signed)
?   Statin related to cramps, overall mild, ok to cont statin as is,  to f/u any worsening symptoms or concerns

## 2014-05-26 NOTE — Assessment & Plan Note (Addendum)
For limited hydrocodone prn, stable overall by history and exam, recent data reviewed with pt, and pt to continue medical treatment as before,  to f/u any worsening symptoms or concerns

## 2014-05-27 LAB — BASIC METABOLIC PANEL
BUN: 23 mg/dL (ref 6–23)
CALCIUM: 9.6 mg/dL (ref 8.4–10.5)
CO2: 26 meq/L (ref 19–32)
CREATININE: 0.9 mg/dL (ref 0.4–1.2)
Chloride: 99 mEq/L (ref 96–112)
GFR: 63.23 mL/min (ref >60.00)
Glucose, Bld: 74 mg/dL (ref 70–99)
Potassium: 3.9 mEq/L (ref 3.5–5.1)
Sodium: 137 mEq/L (ref 135–145)

## 2014-05-27 LAB — HEPATIC FUNCTION PANEL
ALBUMIN: 3.8 g/dL (ref 3.5–5.2)
ALK PHOS: 52 U/L (ref 39–117)
ALT: 14 U/L (ref 0–35)
AST: 21 U/L (ref 0–37)
Bilirubin, Direct: 0.1 mg/dL (ref 0.0–0.3)
TOTAL PROTEIN: 7.3 g/dL (ref 6.0–8.3)
Total Bilirubin: 0.5 mg/dL (ref 0.2–1.2)

## 2014-05-27 LAB — LIPID PANEL
Cholesterol: 196 mg/dL (ref 0–200)
HDL: 49.5 mg/dL (ref >39.00)
LDL Cholesterol: 124 mg/dL — ABNORMAL HIGH (ref 0–99)
NONHDL: 146.5
TRIGLYCERIDES: 112 mg/dL (ref 0.0–149.0)
Total CHOL/HDL Ratio: 4
VLDL: 22.4 mg/dL (ref 0.0–40.0)

## 2014-05-27 LAB — TSH: TSH: 2.36 u[IU]/mL (ref 0.35–4.50)

## 2014-06-01 NOTE — Assessment & Plan Note (Signed)

## 2014-06-09 ENCOUNTER — Ambulatory Visit
Admission: RE | Admit: 2014-06-09 | Discharge: 2014-06-09 | Disposition: A | Discharge status: Home / Self Care | Payer: Medicare Other | Source: Ambulatory Visit | Attending: Internal Medicine | Admitting: Internal Medicine

## 2014-06-09 DIAGNOSIS — Z0000 Encounter for general adult medical examination without abnormal findings: Secondary | ICD-10-CM

## 2014-06-10 LAB — HM PAP SMEAR

## 2014-07-20 ENCOUNTER — Other Ambulatory Visit: Payer: Self-pay | Admitting: Internal Medicine

## 2014-07-22 IMAGING — CT CT ABDOMEN LIMITED W/ CM
2 of 5 series · 16 of 46 positions shown, 18 images · IV contrast (OMNIPAQUE)
Comparison: CT images at the time of hepatic abscess drainage
05/06/2012.  CT abdomen 05/03/2012.

CLINICAL DATA: Follow up hepatic abscess drainage.

CT ABDOMEN WITH CONTRAST
TECHNIQUE: Multidetector CT imaging of the abdomen was performed
using the standard protocol following bolus administration of
intravenous contrast.
Contrast: 100mL OMNIPAQUE IOHEXOL 300 MG/ML. Oral contrast was also
administered.

[Series 2: rtn a/p with · axial · 0.74mm/px · z∈[-246,-61]mm · 13 of 45 slices shown, 15 images]
[im 4/45  soft-tissue]
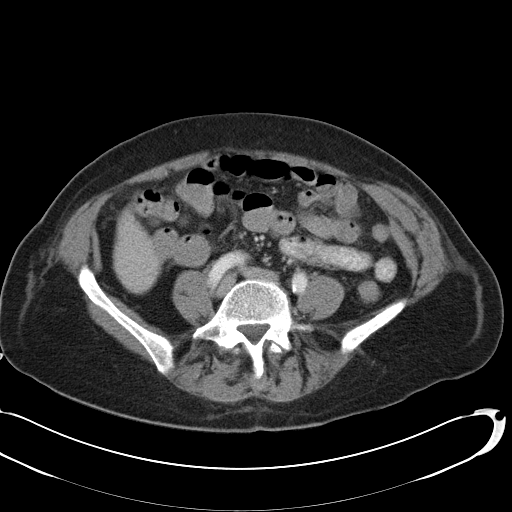
[im 4/45  bone]
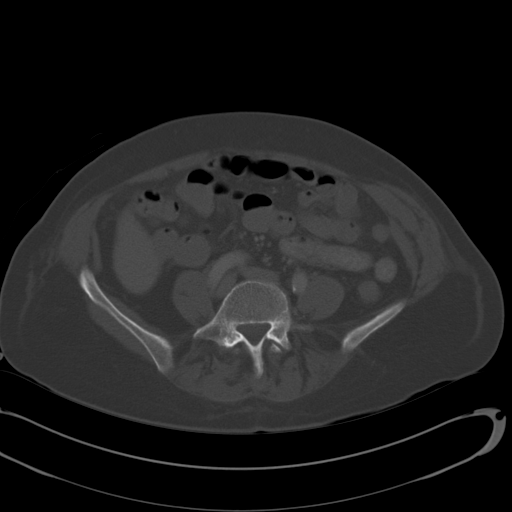
[im 7/45  soft-tissue]
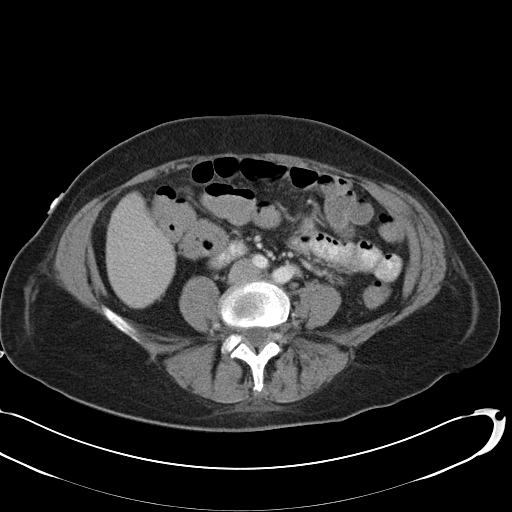
[im 10/45  soft-tissue]
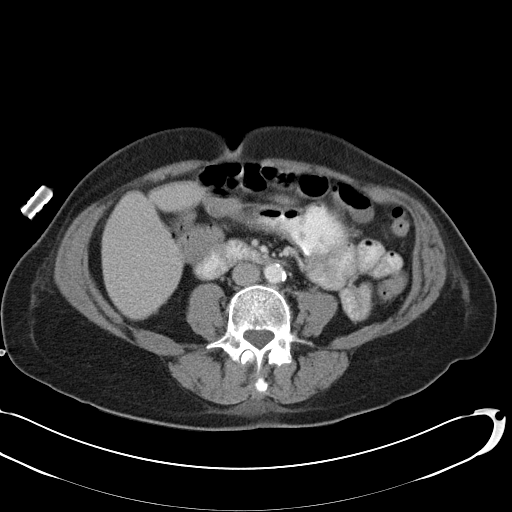
[im 13/45  soft-tissue]
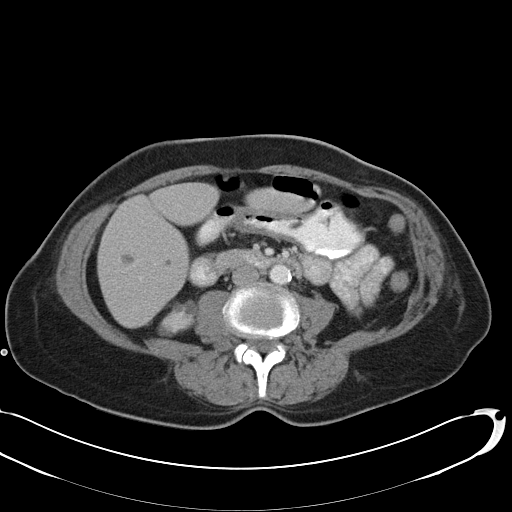
[im 16/45  soft-tissue]
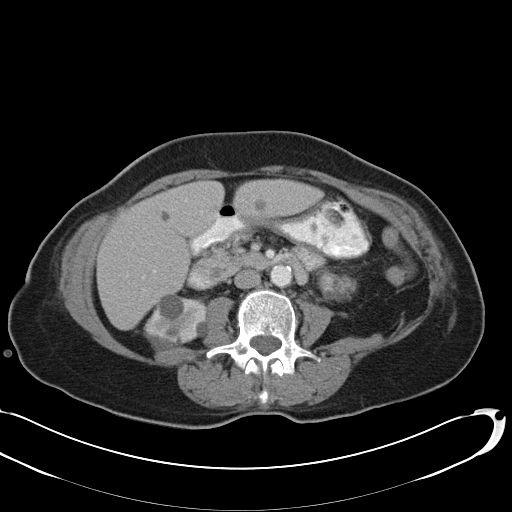
[im 19/45  soft-tissue]
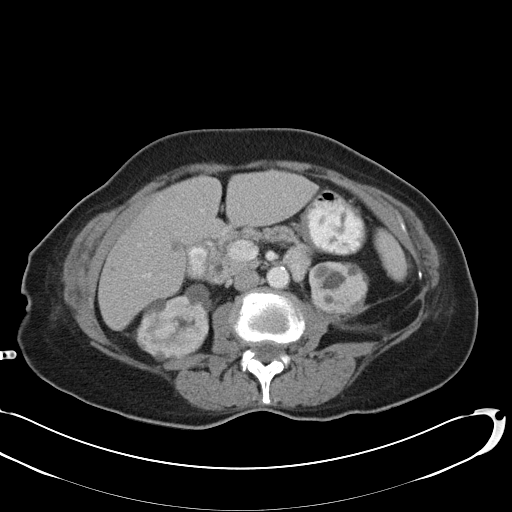
[im 23/45  soft-tissue]
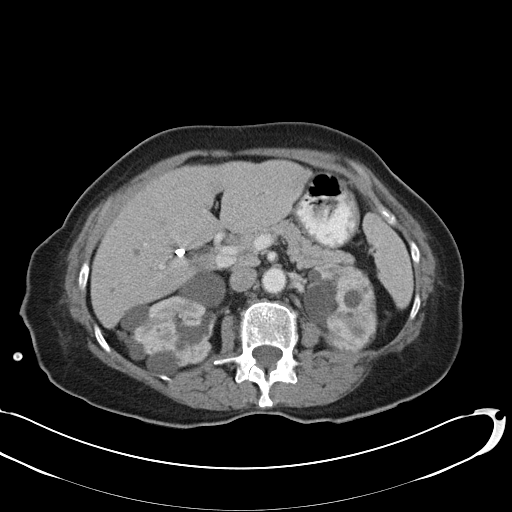
[im 26/45  soft-tissue]
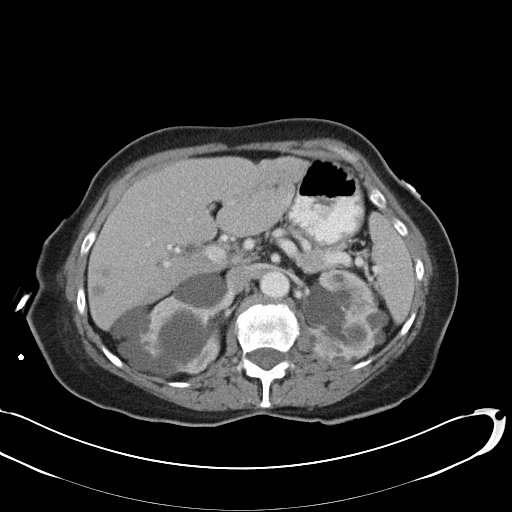
[im 29/45  soft-tissue]
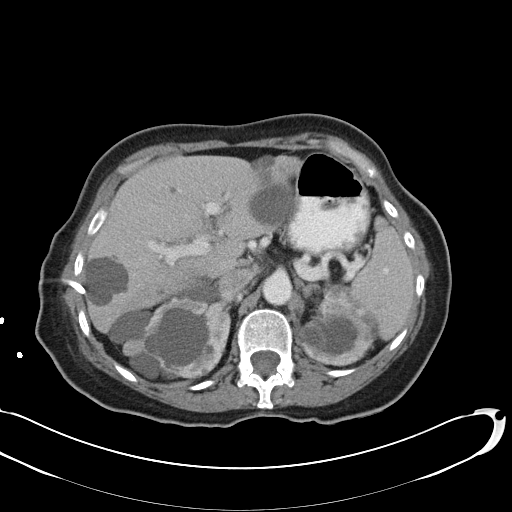
[im 29/45  bone]
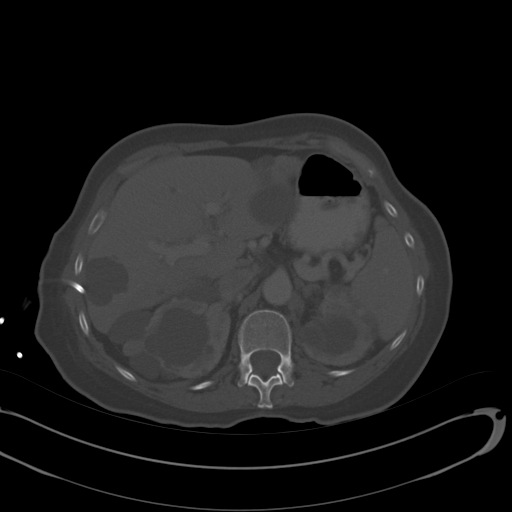
[im 32/45  soft-tissue]
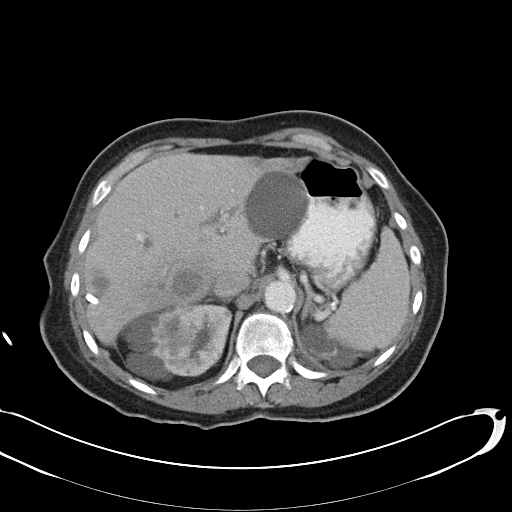
[im 35/45  soft-tissue]
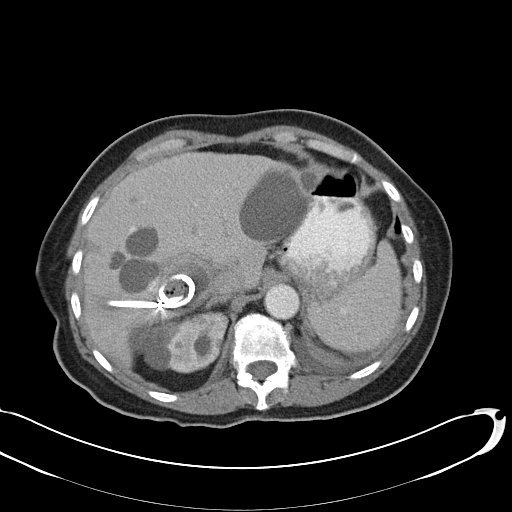
[im 38/45  soft-tissue]
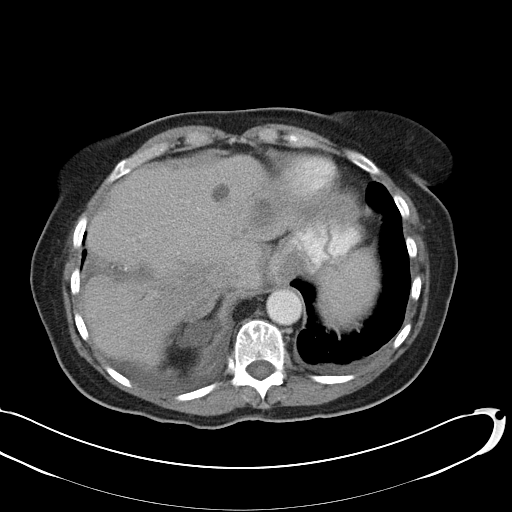
[im 41/45  soft-tissue]
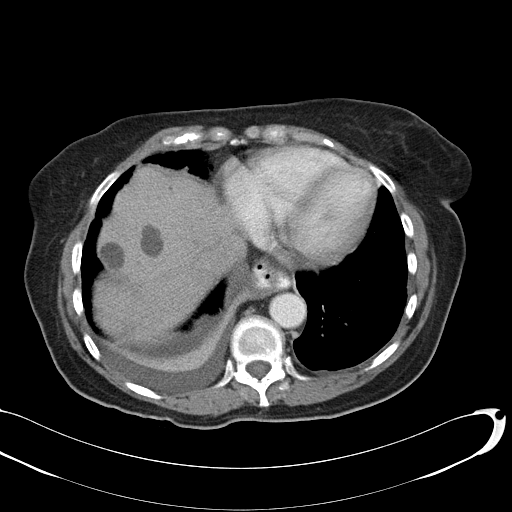

[Series 602: <mpr thick range> · coronal · 0.74mm/px · 3 of 73 slices shown]
[im 25/73  soft-tissue]
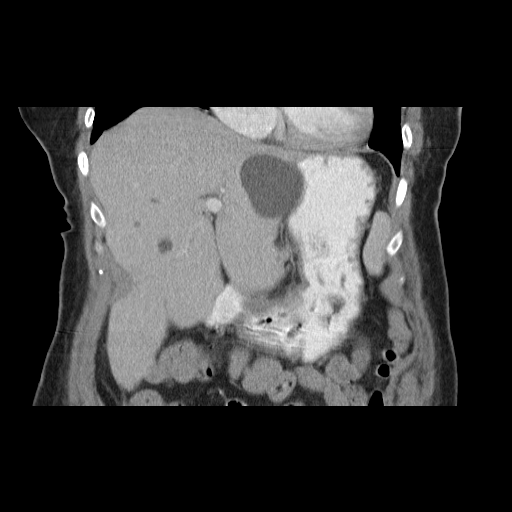
[im 33/73  soft-tissue]
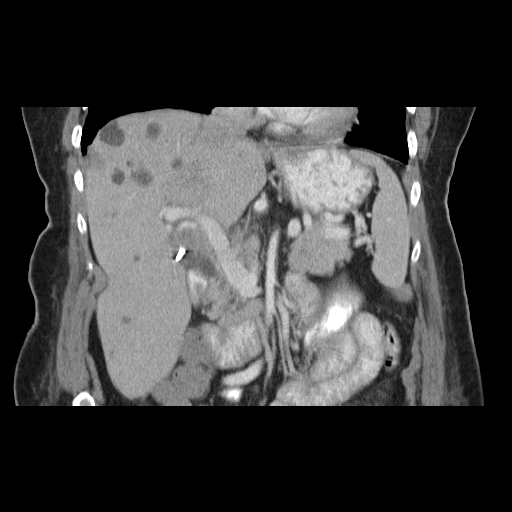
[im 41/73  soft-tissue]
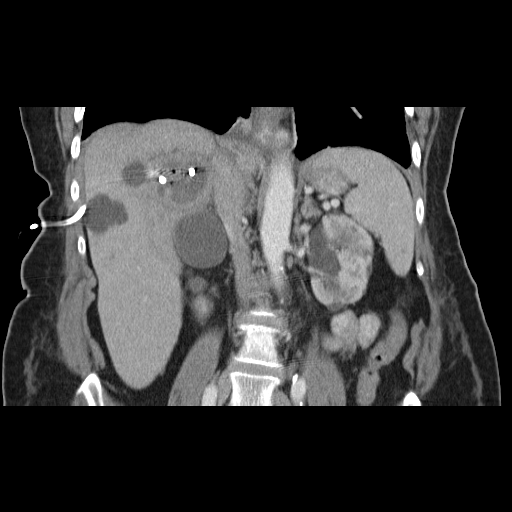

[16 of 46 positions shown; findings below may reference images not displayed]

FINDINGS: Interval decrease in size of the hepatic abscess post
catheter drainage, the fluid collection currently measuring
approximately 4.2 x 3.5 cm (image 11), previously 7.2 x 5.3 cm.
Abscess catheter appropriately positioned within the abscess.

Stable innumerable hepatic and renal cysts.  Stable small hiatal
hernia.  No new abnormalities in the abdomen.

Interval development of a small right pleural effusion and
associated consolidation in the right lower lobe.  Visualized left
lung base clear.  Bone window images again demonstrate degenerative
changes involving the lower thoracic and lumbar spine, with a
chronic disc extrusion at L1-2, containing calcification in the
posterior annular fibers.
IMPRESSION: 1.  Reduction in size of the hepatic abscess post catheter
drainage, measurements given above.  As there is still fluid within
the collection, catheter flushing should be continued.
2.  No new abnormalities in the abdomen since the examination 1
week ago.
3.  Interval small right pleural effusion and associated dense
passive atelectasis or pneumonia in the right lower lobe.
4.  Stable small hiatal hernia.
5.  Polycystic kidney disease with innumerable hepatic and renal
cysts.

## 2014-07-30 ENCOUNTER — Other Ambulatory Visit: Payer: Self-pay | Admitting: Internal Medicine

## 2014-07-30 NOTE — Telephone Encounter (Signed)
Done hardcopy to D

## 2014-07-30 NOTE — Telephone Encounter (Signed)
Notified pt rx ready for pick-up.../lmb 

## 2014-08-04 ENCOUNTER — Other Ambulatory Visit: Payer: Self-pay | Admitting: Internal Medicine

## 2014-08-04 NOTE — Telephone Encounter (Signed)
Faxed hardcopy for Alprazolam to [facility]

## 2014-08-04 NOTE — Telephone Encounter (Signed)
Done hardcopy to [facility]bin  

## 2014-08-24 ENCOUNTER — Encounter: Payer: Self-pay | Admitting: Family

## 2014-08-24 ENCOUNTER — Ambulatory Visit (INDEPENDENT_AMBULATORY_CARE_PROVIDER_SITE_OTHER): Payer: Medicare Other | Admitting: Family

## 2014-08-24 ENCOUNTER — Other Ambulatory Visit: Payer: Self-pay | Admitting: Internal Medicine

## 2014-08-24 VITALS — BP 142/90 | HR 66 | Temp 97.6°F | Resp 16 | Ht 63.0 in | Wt 140.8 lb

## 2014-08-24 DIAGNOSIS — J069 Acute upper respiratory infection, unspecified: Secondary | ICD-10-CM | POA: Insufficient documentation

## 2014-08-24 NOTE — Assessment & Plan Note (Signed)
Symptoms and exam consistent with upper respiratory infection. Continue over-the-counter medications as needed for supportive care and symptom relief. Discussed over-the-counter medications with patient. Follow up if symptoms worsen or fail to improve.

## 2014-08-24 NOTE — Progress Notes (Signed)
Pre visit review using our clinic review tool, if applicable. No additional management support is needed unless otherwise documented below in the visit note. 

## 2014-08-24 NOTE — Progress Notes (Signed)
Subjective:    Patient ID: Alyssa Fernandez, female    DOB: [birth year], 74 y.o.   MRN: [id number]  Chief Complaint  Patient presents with  . Nasal Congestion    x3 days, cough, congestion, and fatigue    HPI:  Alyssa Fernandez is a 74 y.o. female who presents today for an acute visit.  Acute symptoms of cough, congestion and fatigue have been going on for approximately 3 days. Denies any fevers. Pharmacy gave her some mucinex to take which has helped some. Denies any recent antibiotic use.   Allergies  Allergen Reactions  . Codeine   . Sulfonamide Derivatives     Current Outpatient Prescriptions on File Prior to Visit  Medication Sig Dispense Refill  . ALPRAZolam (XANAX) 0.5 MG tablet TAKE ONE TABLET THREE TIMES DAILY AS NEEDED 90 tablet 5  . amLODipine (NORVASC) 5 MG tablet Take 1 tablet (5 mg total) by mouth daily. 90 tablet 3  . amphetamine-dextroamphetamine (ADDERALL) 10 MG tablet Take 1 tablet (10 mg total) by mouth 2 (two) times daily. 60 tablet 0  . atorvastatin (LIPITOR) 20 MG tablet Take 1 tablet (20 mg total) by mouth daily. 90 tablet 3  . calcium carbonate (OS-CAL) 1250 MG tablet Take 1 tablet by mouth daily.      Marland Kitchen DENAVIR 1 % cream AS DIRECTED 5 g 1  . hydrochlorothiazide (HYDRODIURIL) 12.5 MG tablet Take 1 tablet (12.5 mg total) by mouth daily. 90 tablet 3  . HYDROcodone-acetaminophen (NORCO/VICODIN) 5-325 MG per tablet TAKE ONE TABLET EVERY 6 HOURS AS NEEDED 20 tablet 0  . labetalol (NORMODYNE) 200 MG tablet Take 1 tablet (200 mg total) by mouth 2 (two) times daily. 180 tablet 3  . pantoprazole (PROTONIX) 40 MG tablet Take 1 tablet (40 mg total) by mouth daily. 90 tablet 3  . potassium chloride SA (K-DUR,KLOR-CON) 20 MEQ tablet TAKE ONE TABLET TWICE DAILY 60 tablet 11  . PREMARIN 0.625 MG tablet TAKE ONE TABLET EACH DAY FOR 21 DAYS THEN DO NOT TAKE FOR 7 DAYS 63 tablet 0  . sertraline (ZOLOFT) 100 MG tablet TAKE ONE TABLET TWICE DAILY 60 tablet 5   No current  facility-administered medications on file prior to visit.    Review of Systems  Constitutional: Positive for fatigue. Negative for fever and chills.  HENT: Positive for congestion. Negative for sinus pressure and sore throat.   Respiratory: Positive for cough. Negative for shortness of breath.       Objective:    BP 142/90 mmHg  Pulse 66  Temp(Src) 97.6 F (36.4 C) (Oral)  Resp 16  Ht 5\' 3"  (1.6 m)  Wt 140 lb 12.8 oz (63.866 kg)  BMI 24.95 kg/m2  SpO2 97% Nursing note and vital signs reviewed.  Physical Exam  Constitutional: She is oriented to person, place, and time. She appears well-developed and well-nourished. No distress.  HENT:  Right Ear: Hearing, tympanic membrane, external ear and ear canal normal.  Left Ear: Hearing, tympanic membrane, external ear and ear canal normal.  Nose: Nose normal. Right sinus exhibits no maxillary sinus tenderness and no frontal sinus tenderness. Left sinus exhibits no maxillary sinus tenderness and no frontal sinus tenderness.  Mouth/Throat: Uvula is midline, oropharynx is clear and moist and mucous membranes are normal.  Neck: Neck supple.  Cardiovascular: Normal rate, regular rhythm, normal heart sounds and intact distal pulses.   Pulmonary/Chest: Effort normal and breath sounds normal.  Lymphadenopathy:    She has no cervical adenopathy.  Neurological: She is alert and oriented to person, place, and time.  Skin: Skin is warm and dry.  Psychiatric: She has a normal mood and affect. Her behavior is normal. Judgment and thought content normal.       Assessment & Plan:

## 2014-08-24 NOTE — Patient Instructions (Signed)
Thank you for choosing [facility].  Summary/Instructions:  If your symptoms worsen or fail to improve, please contact our office for further instruction, or in case of emergency go directly to the emergency room at the closest medical facility.    General Recommendations:    Please drink plenty of fluids.  Get plenty of rest   Sleep in humidified air  Use saline nasal sprays  Netti pot   OTC Medications:  Decongestants - helps relieve congestion   Flonase (generic fluticasone) or Nasacort (generic triamcinolone) - please make sure to use the "cross-over" technique at a 45 degree angle towards the opposite eye as opposed to straight up the nasal passageway.   Sudafed (generic pseudoephedrine - Note this is the one that is available behind the pharmacy counter); Products with phenylephrine (-PE) may also be used but is often not as effective as pseudoephedrine.   If you have HIGH BLOOD PRESSURE - Coricidin HBP; AVOID any product that is -D as this contains pseudoephedrine which may increase your blood pressure.  Afrin (oxymetazoline) every 6-8 hours for up to 3 days.   Allergies - helps relieve runny nose, itchy eyes and sneezing   Claritin (generic loratidine), Allegra (fexofenidine), or Zyrtec (generic cyrterizine) for runny nose. These medications should not cause drowsiness.  Note - Benadryl (generic diphenhydramine) may be used however may cause drowsiness  Cough -   Delsym or Robitussin (generic dextromethorphan)  Expectorants - helps loosen mucus to ease removal   Mucinex (generic guaifenesin) as directed on the package.  Headaches / General Aches   Tylenol (generic acetaminophen) - DO NOT EXCEED 3 grams (3,000 mg) in a 24 hour time period  Advil/Motrin (generic ibuprofen)   Sore Throat -   Salt water gargle   Chloraseptic (generic benzocaine) spray or lozenges / Sucrets (generic dyclonine)   Upper Respiratory Infection, Adult An upper  respiratory infection (URI) is also sometimes known as the common cold. The upper respiratory tract includes the nose, sinuses, throat, trachea, and bronchi. Bronchi are the airways leading to the lungs. Most people improve within 1 week, but symptoms can last up to 2 weeks. A residual cough may last even longer.  CAUSES Many different viruses can infect the tissues lining the upper respiratory tract. The tissues become irritated and inflamed and often become very moist. Mucus production is also common. A cold is contagious. You can easily spread the virus to others by oral contact. This includes kissing, sharing a glass, coughing, or sneezing. Touching your mouth or nose and then touching a surface, which is then touched by another person, can also spread the virus. SYMPTOMS  Symptoms typically develop 1 to 3 days after you come in contact with a cold virus. Symptoms vary from person to person. They may include:  Runny nose.  Sneezing.  Nasal congestion.  Sinus irritation.  Sore throat.  Loss of voice (laryngitis).  Cough.  Fatigue.  Muscle aches.  Loss of appetite.  Headache.  Low-grade fever. DIAGNOSIS  You might diagnose your own cold based on familiar symptoms, since most people get a cold 2 to 3 times a year. Your caregiver can confirm this based on your exam. Most importantly, your caregiver can check that your symptoms are not due to another disease such as strep throat, sinusitis, pneumonia, asthma, or epiglottitis. Blood tests, throat tests, and X-rays are not necessary to diagnose a common cold, but they may sometimes be helpful in excluding other more serious diseases. Your caregiver will decide if  any further tests are required. RISKS AND COMPLICATIONS  You may be at risk for a more severe case of the common cold if you smoke cigarettes, have chronic heart disease (such as heart failure) or lung disease (such as asthma), or if you have a weakened immune system. The very  young and very old are also at risk for more serious infections. Bacterial sinusitis, middle ear infections, and bacterial pneumonia can complicate the common cold. The common cold can worsen asthma and chronic obstructive pulmonary disease (COPD). Sometimes, these complications can require emergency medical care and may be life-threatening. PREVENTION  The best way to protect against getting a cold is to practice good hygiene. Avoid oral or hand contact with people with cold symptoms. Wash your hands often if contact occurs. There is no clear evidence that vitamin C, vitamin E, echinacea, or exercise reduces the chance of developing a cold. However, it is always recommended to get plenty of rest and practice good nutrition. TREATMENT  Treatment is directed at relieving symptoms. There is no cure. Antibiotics are not effective, because the infection is caused by a virus, not by bacteria. Treatment may include:  Increased fluid intake. Sports drinks offer valuable electrolytes, sugars, and fluids.  Breathing heated mist or steam (vaporizer or shower).  Eating chicken soup or other clear broths, and maintaining good nutrition.  Getting plenty of rest.  Using gargles or lozenges for comfort.  Controlling fevers with ibuprofen or acetaminophen as directed by your caregiver.  Increasing usage of your inhaler if you have asthma. Zinc gel and zinc lozenges, taken in the first 24 hours of the common cold, can shorten the duration and lessen the severity of symptoms. Pain medicines may help with fever, muscle aches, and throat pain. A variety of non-prescription medicines are available to treat congestion and runny nose. Your caregiver can make recommendations and may suggest nasal or lung inhalers for other symptoms.  HOME CARE INSTRUCTIONS   Only take over-the-counter or prescription medicines for pain, discomfort, or fever as directed by your caregiver.  Use a warm mist humidifier or inhale steam  from a shower to increase air moisture. This may keep secretions moist and make it easier to breathe.  Drink enough water and fluids to keep your urine clear or pale yellow.  Rest as needed.  Return to work when your temperature has returned to normal or as your caregiver advises. You may need to stay home longer to avoid infecting others. You can also use a face mask and careful hand washing to prevent spread of the virus. SEEK MEDICAL CARE IF:   After the first few days, you feel you are getting worse rather than better.  You need your caregiver's advice about medicines to control symptoms.  You develop chills, worsening shortness of breath, or Driggers or red sputum. These may be signs of pneumonia.  You develop yellow or Fasig nasal discharge or pain in the face, especially when you bend forward. These may be signs of sinusitis.  You develop a fever, swollen neck glands, pain with swallowing, or white areas in the back of your throat. These may be signs of strep throat. SEEK IMMEDIATE MEDICAL CARE IF:   You have a fever.  You develop severe or persistent headache, ear pain, sinus pain, or chest pain.  You develop wheezing, a prolonged cough, cough up blood, or have a change in your usual mucus (if you have chronic lung disease).  You develop sore muscles or a stiff neck.  Document Released: 01/31/2001 Document Revised: 10/30/2011 Document Reviewed: 11/12/2013 [facility] Patient Information 2015 [place], [place]. This information is not intended to replace advice given to you by your health care provider. Make sure you discuss any questions you have with your health care provider.

## 2014-09-26 ENCOUNTER — Other Ambulatory Visit: Payer: Self-pay | Admitting: Internal Medicine

## 2014-10-07 ENCOUNTER — Telehealth: Payer: Self-pay | Admitting: Internal Medicine

## 2014-10-07 NOTE — Telephone Encounter (Signed)
Ok to [employer]

## 2014-10-07 NOTE — Telephone Encounter (Signed)
Patient states Fambrough gardiner has sent PA over for the two weeks for premarin.  She is requesting to be completed as soon as possible.

## 2014-10-07 NOTE — Telephone Encounter (Signed)
Prior auth started

## 2014-11-18 ENCOUNTER — Telehealth: Payer: Self-pay | Admitting: Internal Medicine

## 2014-11-18 MED ORDER — ESTROGENS CONJUGATED 0.625 MG PO TABS: ORAL_TABLET | ORAL | Status: DC

## 2014-11-18 MED ORDER — HYDROCODONE-ACETAMINOPHEN 5-325 MG PO TABS: 1.0000 | ORAL_TABLET | Freq: Every day | ORAL | Status: DC | PRN

## 2014-11-18 NOTE — Telephone Encounter (Signed)
Pain med Done hardcopy to [facility] premarin - done erx

## 2014-11-18 NOTE — Telephone Encounter (Signed)
RX is at the front desk for pickup.

## 2014-11-25 ENCOUNTER — Ambulatory Visit (INDEPENDENT_AMBULATORY_CARE_PROVIDER_SITE_OTHER): Payer: Medicare Other | Admitting: Internal Medicine

## 2014-11-25 ENCOUNTER — Encounter: Payer: Self-pay | Admitting: Internal Medicine

## 2014-11-25 VITALS — BP 114/64 | HR 58 | Temp 97.7°F | Resp 18 | Ht 63.0 in | Wt 137.0 lb

## 2014-11-25 DIAGNOSIS — E785 Hyperlipidemia, unspecified: Secondary | ICD-10-CM

## 2014-11-25 DIAGNOSIS — R5383 Other fatigue: Secondary | ICD-10-CM | POA: Insufficient documentation

## 2014-11-25 DIAGNOSIS — E039 Hypothyroidism, unspecified: Secondary | ICD-10-CM | POA: Diagnosis not present

## 2014-11-25 DIAGNOSIS — Z0000 Encounter for general adult medical examination without abnormal findings: Secondary | ICD-10-CM

## 2014-11-25 DIAGNOSIS — Z0189 Encounter for other specified special examinations: Secondary | ICD-10-CM

## 2014-11-25 DIAGNOSIS — I10 Essential (primary) hypertension: Secondary | ICD-10-CM | POA: Diagnosis not present

## 2014-11-25 DIAGNOSIS — F419 Anxiety disorder, unspecified: Secondary | ICD-10-CM

## 2014-11-25 HISTORY — DX: Other fatigue: R53.83

## 2014-11-25 NOTE — Progress Notes (Signed)
Pre visit review using our clinic review tool, if applicable. No additional management support is needed unless otherwise documented below in the visit note. 

## 2014-11-25 NOTE — Patient Instructions (Signed)
Please continue all other medications as before, and refills have been done if requested.  Please have the pharmacy call with any other refills you may need.  Please continue your efforts at being more active, low cholesterol diet, and weight control.  You are otherwise up to date with prevention measures today.  Please keep your appointments with your specialists as you may have planned  Please return in 6 months, or sooner if needed, with Lab testing done 3-5 days before  

## 2014-11-25 NOTE — Assessment & Plan Note (Signed)
Somewhat labile, states most BP's in the 140/s, today borderline low, not clear how often this occurs,  to cont to monitor without change,  BP Readings from Last 3 Encounters:  11/25/14 114/64  08/24/14 142/90  05/26/14 140/86

## 2014-11-25 NOTE — Assessment & Plan Note (Signed)
With ongoing stress over primary caretaking of her 76yo husband, but remains supportive

## 2014-11-25 NOTE — Assessment & Plan Note (Signed)
stable overall by history and exam, recent data reviewed with pt, and pt to continue medical treatment as before,  to f/u any worsening symptoms or concerns Lab Results  Component Value Date   LDLCALC 124* 05/26/2014   Encourage med compliacne

## 2014-11-25 NOTE — Progress Notes (Signed)
Subjective:    Patient ID: Alyssa Fernandez, female    DOB: 08-03-1941, 74 y.o.   MRN: [id number]  HPI    C/o fatigue, "tired allover"  Pt denies chest pain, increased sob or doe, wheezing, orthopnea, PND, increased LE swelling, palpitations, dizziness or syncope.  Pt denies new neurological symptoms such as new headache, or facial or extremity weakness or numbness.   Pt denies polydipsia, polyuria,.  More stress lately, husband with dementia and feels she does tend to go to bed at night later than otherwise would, not getting enough sleep.  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  Maybe snores at night, not sure, but no worsening hypersomnolence.  No overt bleeding.  Denies worsening depressive symptoms, suicidal ideation, or panic; has ongoing anxiety, not increased recently. Has known right rot cuff issue s/p PT, thinks left may be starting up, not really taking the simvastatin. Has declinesd surgury for shoulder so far.  Pt continues to have recurring LBP without change in severity, bowel or bladder change, fever, wt loss,  worsening LE pain/numbness/weakness, gait change or falls. Denies hyper or hypo thyroid symptoms such as voice, skin or hair change.  Past Medical History  Diagnosis Date  . Hyperlipidemia   . Hypertension   . Polycystic kidney disease   . Anxiety   . RENAL FAILURE   . 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 HYPOTHYROIDISM   . GERD   . VITAMIN D DEFICIENCY   . OSTEOPENIA   . INSOMNIA   . 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 Partial nephrectomy    . Abdominal  hysterectomy    . Cervical disc surgery    . Bunionectomy    . Fx rt ankle    . Left temporal bx artery-negative 2     . Bladder operation, but per sling procedure    . 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 Percutaneous drainage of liver abscess  05/06/2012    reports that she has never smoked. She has never used smokeless tobacco. She reports that she does not drink alcohol or use illicit drugs. family history includes Stroke in her mother. Allergies  Allergen Reactions  . Codeine   . Sulfonamide Derivatives    Current Outpatient Prescriptions on File Prior to Visit  Medication Sig Dispense Refill  . ALPRAZolam (XANAX) 0.5 MG tablet TAKE ONE TABLET THREE TIMES DAILY AS NEEDED 90 tablet 5  . amLODipine (NORVASC) 5 MG tablet Take 1 tablet (5 mg total) by mouth daily. 90 tablet 3  . amphetamine-dextroamphetamine (ADDERALL) 10 MG tablet Take 1 tablet (10 mg total) by mouth 2 (two) times daily. 60 tablet 0  . atorvastatin (LIPITOR) 20 MG tablet Take 1 tablet (20 mg total) by mouth daily. 90 tablet 3  . calcium carbonate (OS-CAL) 1250 MG tablet Take 1 tablet by mouth daily.      Marland Kitchen DENAVIR 1 % cream AS DIRECTED 5 g 1  . estrogens, conjugated, (PREMARIN) 0.625 MG tablet Take daily for 21  days then do not take for 7 days. 63 tablet 3  . hydrochlorothiazide (HYDRODIURIL) 12.5 MG tablet Take 1 tablet (12.5 mg total) by mouth daily. 90 tablet 3  . HYDROcodone-acetaminophen (NORCO/VICODIN) 5-325 MG per tablet Take 1 tablet by mouth daily as needed for moderate pain. 20 tablet 0  . labetalol (NORMODYNE) 200 MG tablet Take 1 tablet (200 mg total) by mouth 2 (two) times daily. 180 tablet 3  . pantoprazole (PROTONIX) 40 MG tablet Take 1 tablet (40 mg total) by mouth daily. 90 tablet 3  . potassium chloride SA (K-DUR,KLOR-CON) 20 MEQ tablet TAKE ONE TABLET TWICE DAILY 60 tablet 10  . sertraline (ZOLOFT) 100 MG tablet TAKE ONE TABLET TWICE DAILY 60 tablet 10   No current facility-administered medications on  file prior to visit.    Review of Systems  Constitutional: Negative for unusual diaphoresis or night sweats HENT: Negative for ringing in ear or discharge Eyes: Negative for double vision or worsening visual disturbance.  Respiratory: Negative for choking and stridor.   Gastrointestinal: Negative for vomiting or other signifcant bowel change Genitourinary: Negative for hematuria or change in urine volume.  Musculoskeletal: Negative for other MSK pain or swelling Skin: Negative for color change and worsening wound.  Neurological: Negative for tremors and numbness other than noted  Psychiatric/Behavioral: Negative for decreased concentration or agitation other than above       Objective:   Physical Exam BP 114/64 mmHg  Pulse 58  Temp(Src) 97.7 F (36.5 C) (Oral)  Resp 18  Ht 5\' 3"  (1.6 m)  Wt 137 lb (62.143 kg)  BMI 24.27 kg/m2  SpO2 98% VS noted,  Constitutional: Pt appears in no significant distress HENT: Head: NCAT.  Right Ear: External ear normal.  Left Ear: External ear normal.  Eyes: . Pupils are equal, round, and reactive to light. Conjunctivae and EOM are normal Neck: Normal range of motion. Neck supple.  Cardiovascular: Normal rate and regular rhythm.   Pulmonary/Chest: Effort normal and breath sounds without rales or wheezing.  Abd:  Soft, NT, ND, + BS Neurological: Pt is alert. Not confused , motor grossly intact Skin: Skin is warm. No rash, no LE edema Psychiatric: Pt behavior is normal. No agitation.      Assessment & Plan:

## 2014-11-25 NOTE — Assessment & Plan Note (Signed)
stable overall by history and exam, recent data reviewed with pt, and pt to continue medical treatment as before,  to f/u any worsening symptoms or concerns Lab Results  Component Value Date   TSH 2.36 05/26/2014

## 2014-11-25 NOTE — Assessment & Plan Note (Signed)
Etiology unclear, exam benign except for ongoing home stress, and cant r/o borderline lower BP today, Mild to mod, stable overall by history and exam, recent data reviewed with pt, and pt to continue medical treatment as before,  to f/u any worsening symptoms or concerns Lab Results  Component Value Date   WBC 6.0 05/26/2014   HGB 13.6 05/26/2014   HCT 40.1 05/26/2014   PLT 233.0 05/26/2014   GLUCOSE 74 05/26/2014   CHOL 196 05/26/2014   TRIG 112.0 05/26/2014   HDL 49.50 05/26/2014   LDLCALC 124* 05/26/2014   ALT 14 05/26/2014   AST 21 05/26/2014   NA 137 05/26/2014   K 3.9 05/26/2014   CL 99 05/26/2014   CREATININE 0.9 05/26/2014   BUN 23 05/26/2014   CO2 26 05/26/2014   TSH 2.36 05/26/2014   INR 1.21 05/06/2012   HGBA1C 5.9 05/19/2008

## 2015-02-16 ENCOUNTER — Other Ambulatory Visit: Payer: Self-pay | Admitting: Internal Medicine

## 2015-02-17 NOTE — Telephone Encounter (Signed)
Done hardcopy to Dahlia  

## 2015-02-17 NOTE — Telephone Encounter (Signed)
Rx faxed to pharmacy  

## 2015-03-19 ENCOUNTER — Other Ambulatory Visit: Payer: Self-pay | Admitting: Internal Medicine

## 2015-03-24 ENCOUNTER — Other Ambulatory Visit: Payer: Self-pay | Admitting: Internal Medicine

## 2015-04-05 ENCOUNTER — Other Ambulatory Visit: Payer: Self-pay | Admitting: Internal Medicine

## 2015-06-03 ENCOUNTER — Other Ambulatory Visit (INDEPENDENT_AMBULATORY_CARE_PROVIDER_SITE_OTHER): Payer: Medicare Other

## 2015-06-03 ENCOUNTER — Ambulatory Visit (INDEPENDENT_AMBULATORY_CARE_PROVIDER_SITE_OTHER): Payer: Medicare Other | Admitting: Internal Medicine

## 2015-06-03 ENCOUNTER — Encounter: Payer: Self-pay | Admitting: Internal Medicine

## 2015-06-03 VITALS — BP 172/104 | HR 60 | Temp 97.7°F | Ht 63.0 in | Wt 138.0 lb

## 2015-06-03 DIAGNOSIS — F419 Anxiety disorder, unspecified: Secondary | ICD-10-CM | POA: Diagnosis not present

## 2015-06-03 DIAGNOSIS — E785 Hyperlipidemia, unspecified: Secondary | ICD-10-CM | POA: Diagnosis not present

## 2015-06-03 DIAGNOSIS — Z0001 Encounter for general adult medical examination with abnormal findings: Secondary | ICD-10-CM | POA: Diagnosis not present

## 2015-06-03 DIAGNOSIS — Z0000 Encounter for general adult medical examination without abnormal findings: Secondary | ICD-10-CM

## 2015-06-03 DIAGNOSIS — Z23 Encounter for immunization: Secondary | ICD-10-CM | POA: Insufficient documentation

## 2015-06-03 DIAGNOSIS — I10 Essential (primary) hypertension: Secondary | ICD-10-CM | POA: Diagnosis not present

## 2015-06-03 LAB — BASIC METABOLIC PANEL
BUN: 21 mg/dL (ref 6–23)
CALCIUM: 9.3 mg/dL (ref 8.4–10.5)
CO2: 31 meq/L (ref 19–32)
CREATININE: 1.07 mg/dL (ref 0.40–1.20)
Chloride: 105 mEq/L (ref 96–112)
GFR: 52.97 mL/min — AB (ref >60.00)
Glucose, Bld: 101 mg/dL — ABNORMAL HIGH (ref 70–99)
Potassium: 4.9 mEq/L (ref 3.5–5.1)
SODIUM: 144 meq/L (ref 135–145)

## 2015-06-03 MED ORDER — ALPRAZOLAM 0.5 MG PO TABS: ORAL_TABLET | ORAL | Status: DC

## 2015-06-03 MED ORDER — SERTRALINE HCL 100 MG PO TABS: 100.0000 mg | ORAL_TABLET | Freq: Two times a day (BID) | ORAL | Status: DC

## 2015-06-03 MED ORDER — TELMISARTAN 40 MG PO TABS: 40.0000 mg | ORAL_TABLET | Freq: Every day | ORAL | Status: DC

## 2015-06-03 MED ORDER — ESTROGENS CONJUGATED 0.625 MG PO TABS: ORAL_TABLET | ORAL | Status: DC

## 2015-06-03 MED ORDER — AMPHETAMINE-DEXTROAMPHETAMINE 10 MG PO TABS: 10.0000 mg | ORAL_TABLET | Freq: Two times a day (BID) | ORAL | Status: DC

## 2015-06-03 MED ORDER — LABETALOL HCL 200 MG PO TABS: 200.0000 mg | ORAL_TABLET | Freq: Two times a day (BID) | ORAL | Status: DC

## 2015-06-03 NOTE — Assessment & Plan Note (Signed)
[facility] for off statin for now, consider different statin

## 2015-06-03 NOTE — Patient Instructions (Addendum)
You had the flu shot today  Ok to stop the HCTZ fluid pill, protonix, and potassium, and lipitor  Please take all new medication as prescribed - the micardis 40 mg per day  Please continue all other medications as before, and refills have been done if requested, including the adderall , and xanax  Depending on the cholesterol, we may need to provide a different statin  Please have the pharmacy call with any other refills you may need.  Please continue your efforts at being more active, low cholesterol diet, and weight control.  You are otherwise up to date with prevention measures today.  Please keep your appointments with your specialists as you may have planned  Please go to the LAB in the Basement (turn left off the elevator) for the tests to be done today  You will be contacted by phone if any changes need to be made immediately.  Otherwise, you will receive a letter about your results with an explanation, but please check with MyChart first.  Please also go to the LAB in the Basement (turn left off the elevator) for the tests to be done in 2 weeks to check the kidney function  Please remember to sign up for MyChart if you have not done so, as this will be important to you in the future with finding out test results, communicating by private email, and scheduling acute appointments online when needed.  Please return in 6 months, or sooner if needed

## 2015-06-03 NOTE — Assessment & Plan Note (Signed)

## 2015-06-03 NOTE — Assessment & Plan Note (Signed)
Mild uncontrolled, to check cr today with labs, add micardis 40 qd, cont all other meds, f/u BP at home twice weekly as she does, call for persistnet > 140/90, also for fu BMP in 2 wks

## 2015-06-03 NOTE — Assessment & Plan Note (Signed)
stable overall by history and exam, and pt to continue medical treatment as before,  to f/u any worsening symptoms or concerns 

## 2015-06-03 NOTE — Progress Notes (Signed)
Pre visit review using our clinic review tool, if applicable. No additional management support is needed unless otherwise documented below in the visit note. 

## 2015-06-03 NOTE — Progress Notes (Signed)
Subjective:    Patient ID: Alyssa Fernandez, female    DOB: 18-May-1941, 74 y.o.   MRN: [id number]  HPI  Here for wellness and f/u;  Overall doing ok;  Pt denies Chest pain, worsening SOB, DOE, wheezing, orthopnea, PND, worsening LE edema, palpitations, dizziness or syncope.  Pt denies neurological change such as new headache, facial or extremity weakness.  Pt denies polydipsia, polyuria, or low sugar symptoms. Pt states overall good compliance with treatment and medications, good tolerability, and has been trying to follow appropriate diet.  Pt denies worsening depressive symptoms, suicidal ideation or panic. No fever, night sweats, wt loss, loss of appetite, or other constitutional symptoms.  Pt states good ability with ADL's, has low fall risk, home safety reviewed and adequate, no other significant changes in hearing or vision, and only occasionally active with exercise. Pt continues to have recurring LBP without change in severity, bowel or bladder change, fever, wt loss,  worsening LE pain/numbness/weakness, gait change or falls - sees Dr Nelva Bush. BP's tend to be labile, checks infreq at home, but seems for the most part ave in the last 2 mo - ave 149/90, with occasions of marked elevated. BP Readings from Last 3 Encounters:  06/03/15 172/104  11/25/14 114/64  08/24/14 142/90  Has not been taking the diuretic since it seemed to make her edema worse. Has been taking protonix rarely , using tums only every week or so.  Not taking the K supplement as well or rarely.  Only takes adderall prn driving longer distances to reduce narcolepsy risk. Does well with ADL's, needs help with lifting and heavy chores at home - has housekeeper, and handyman/landscaper.  Not taking statin due to less cognitive ability Past Medical History  Diagnosis Date  . Hyperlipidemia   . Hypertension   . Polycystic kidney disease   . Anxiety   . RENAL FAILURE   . 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  HYPOTHYROIDISM   . GERD   . VITAMIN D DEFICIENCY   . OSTEOPENIA   . INSOMNIA   . 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 Partial nephrectomy    . Abdominal hysterectomy    . Cervical disc surgery    . Bunionectomy    . Fx rt ankle    . Left temporal bx artery-negative 2     . Bladder operation, but per sling procedure    . 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 Percutaneous drainage of liver abscess  05/06/2012    reports that she has never smoked. She has never used smokeless tobacco. She reports that she does not drink alcohol or use illicit drugs. family history includes Stroke in her mother. Allergies  Allergen Reactions  . Codeine   . Sulfonamide Derivatives     Current Outpatient Prescriptions on File Prior to Visit  Medication Sig Dispense Refill  . ALPRAZolam (XANAX) 0.5 MG tablet TAKE ONE TABLET THREE TIMES DAILY AS NEEDED 90 tablet 2  . amphetamine-dextroamphetamine (ADDERALL) 10 MG tablet Take 1 tablet (10 mg total) by mouth 2 (two) times daily. 60 tablet 0  . atorvastatin (LIPITOR) 20 MG tablet TAKE ONE TABLET EACH DAY 90 tablet 3  . estrogens, conjugated, (PREMARIN) 0.625 MG tablet Take daily for 21 days then do not take for 7 days. 63 tablet 3  . HYDROcodone-acetaminophen (NORCO/VICODIN) 5-325 MG per tablet Take 1  tablet by mouth daily as needed for moderate pain. 20 tablet 0  . labetalol (NORMODYNE) 200 MG tablet TAKE ONE TABLET TWICE DAILY 180 tablet 0  . pantoprazole (PROTONIX) 40 MG tablet Take 1 tablet (40 mg total) by mouth daily. 90 tablet 3  . sertraline (ZOLOFT) 100 MG tablet TAKE ONE TABLET TWICE DAILY 60 tablet 10  . calcium carbonate (OS-CAL) 1250 MG tablet Take 1 tablet by mouth daily.       No current facility-administered medications on file prior to visit.    Review of Systems Constitutional: Negative for increased diaphoresis, other activity, appetite or siginficant weight change  other than noted HENT: Negative for worsening hearing loss, ear pain, facial swelling, mouth sores and neck stiffness.   Eyes: Negative for other worsening pain, redness or visual disturbance.  Respiratory: Negative for shortness of breath and wheezing  Cardiovascular: Negative for chest pain and palpitations.  Gastrointestinal: Negative for diarrhea, blood in stool, abdominal distention or other pain Genitourinary: Negative for hematuria, flank pain or change in urine volume.  Musculoskeletal: Negative for myalgias or other joint complaints.  Skin: Negative for color change and wound or drainage.  Neurological: Negative for syncope and numbness. other than noted Hematological: Negative for adenopathy. or other swelling Psychiatric/Behavioral: Negative for hallucinations, SI, self-injury, decreased concentration or other worsening agitation.      Objective:   Physical Exam BP 172/104 mmHg  Pulse 60  Temp(Src) 97.7 F (36.5 C) (Oral)  Ht 5\' 3"  (1.6 m)  Wt 138 lb (62.596 kg)  BMI 24.45 kg/m2  SpO2 96% VS noted,  Constitutional: Pt is oriented to person, place, and time. Appears well-developed and well-nourished, in no significant distress Head: Normocephalic and atraumatic.  Right Ear: External ear normal.  Left Ear: External ear normal.  Nose: Nose normal.  Mouth/Throat: Oropharynx is clear and moist.  Eyes: Conjunctivae and EOM are normal. Pupils are equal, round, and reactive to light.  Neck: Normal range of motion. Neck supple. No JVD present. No tracheal deviation present or significant neck LA or mass Cardiovascular: Normal rate, regular rhythm, normal heart sounds and intact distal pulses.   Pulmonary/Chest: Effort normal and breath sounds without rales or wheezing  Abdominal: Soft. Bowel sounds are normal. NT. No HSM  Musculoskeletal: Normal range of motion. Exhibits no edema.  Lymphadenopathy:  Has no cervical adenopathy.  Neurological: Pt is alert and oriented to person,  place, and time. Pt has normal reflexes. No cranial nerve deficit. Motor grossly intact Skin: Skin is warm and dry. No rash noted.  Psychiatric:  Has mild nervous mood and affect. Behavior is normal.     Assessment & Plan:

## 2015-06-10 ENCOUNTER — Telehealth: Payer: Self-pay | Admitting: Internal Medicine

## 2015-06-10 ENCOUNTER — Telehealth: Payer: Self-pay | Admitting: Emergency Medicine

## 2015-06-10 NOTE — Telephone Encounter (Signed)
As [place] approval for adderall has been approved for 1 year as of today and patient will received a letter for her records.

## 2015-06-10 NOTE — Telephone Encounter (Signed)
[employer] called to finish prior auth. Information was given.

## 2015-06-23 ENCOUNTER — Other Ambulatory Visit (INDEPENDENT_AMBULATORY_CARE_PROVIDER_SITE_OTHER): Payer: Medicare Other

## 2015-06-23 DIAGNOSIS — Z0189 Encounter for other specified special examinations: Secondary | ICD-10-CM | POA: Diagnosis not present

## 2015-06-23 DIAGNOSIS — Z0000 Encounter for general adult medical examination without abnormal findings: Secondary | ICD-10-CM

## 2015-06-23 LAB — LIPID PANEL
CHOLESTEROL: 237 mg/dL — AB (ref 0–200)
HDL: 53.9 mg/dL (ref >39.00)
LDL CALC: 161 mg/dL — AB (ref 0–99)
NonHDL: 183.1
TRIGLYCERIDES: 109 mg/dL (ref 0.0–149.0)
Total CHOL/HDL Ratio: 4
VLDL: 21.8 mg/dL (ref 0.0–40.0)

## 2015-06-23 LAB — CBC WITH DIFFERENTIAL/PLATELET
Basophils Absolute: 0 10*3/uL (ref 0.0–0.1)
Basophils Relative: 0.5 % (ref 0.0–3.0)
EOS PCT: 5.5 % — AB (ref 0.0–5.0)
Eosinophils Absolute: 0.3 10*3/uL (ref 0.0–0.7)
HCT: 40.7 % (ref 36.0–46.0)
HEMOGLOBIN: 13.5 g/dL (ref 12.0–15.0)
LYMPHS PCT: 21.2 % (ref 12.0–46.0)
Lymphs Abs: 1.1 10*3/uL (ref 0.7–4.0)
MCHC: 33.2 g/dL (ref 30.0–36.0)
MCV: 88.4 fl (ref 78.0–100.0)
MONO ABS: 0.4 10*3/uL (ref 0.1–1.0)
Monocytes Relative: 7.2 % (ref 3.0–12.0)
Neutro Abs: 3.5 10*3/uL (ref 1.4–7.7)
Neutrophils Relative %: 65.6 % (ref 43.0–77.0)
Platelets: 224 10*3/uL (ref 150.0–400.0)
RBC: 4.6 Mil/uL (ref 3.87–5.11)
RDW: 13.9 % (ref 11.5–15.5)
WBC: 5.4 10*3/uL (ref 4.0–10.5)

## 2015-06-23 LAB — URINALYSIS, ROUTINE W REFLEX MICROSCOPIC
Bilirubin Urine: NEGATIVE
Hgb urine dipstick: NEGATIVE
Ketones, ur: NEGATIVE
LEUKOCYTES UA: NEGATIVE
Nitrite: NEGATIVE
RBC / HPF: NONE SEEN (ref 0–?)
Specific Gravity, Urine: 1.02 (ref 1.000–1.030)
TOTAL PROTEIN, URINE-UPE24: NEGATIVE
Urine Glucose: NEGATIVE
Urobilinogen, UA: 0.2 (ref 0.0–1.0)
pH: 6 (ref 5.0–8.0)

## 2015-06-23 LAB — HEPATIC FUNCTION PANEL
ALBUMIN: 3.9 g/dL (ref 3.5–5.2)
ALT: 10 U/L (ref 0–35)
AST: 15 U/L (ref 0–37)
Alkaline Phosphatase: 57 U/L (ref 39–117)
Bilirubin, Direct: 0.1 mg/dL (ref 0.0–0.3)
Total Bilirubin: 0.6 mg/dL (ref 0.2–1.2)
Total Protein: 6.4 g/dL (ref 6.0–8.3)

## 2015-06-23 LAB — BASIC METABOLIC PANEL
BUN: 23 mg/dL (ref 6–23)
CALCIUM: 8.9 mg/dL (ref 8.4–10.5)
CO2: 27 mEq/L (ref 19–32)
CREATININE: 1.03 mg/dL (ref 0.40–1.20)
Chloride: 105 mEq/L (ref 96–112)
GFR: 55.34 mL/min — AB (ref >60.00)
GLUCOSE: 108 mg/dL — AB (ref 70–99)
POTASSIUM: 4 meq/L (ref 3.5–5.1)
Sodium: 141 mEq/L (ref 135–145)

## 2015-06-23 LAB — TSH: TSH: 1.97 u[IU]/mL (ref 0.35–4.50)

## 2015-06-24 ENCOUNTER — Telehealth: Payer: Self-pay | Admitting: Internal Medicine

## 2015-06-24 MED ORDER — ROSUVASTATIN CALCIUM 20 MG PO TABS: 20.0000 mg | ORAL_TABLET | Freq: Every day | ORAL | Status: DC

## 2015-06-24 NOTE — Telephone Encounter (Signed)
[facility] done erx 

## 2015-09-10 ENCOUNTER — Ambulatory Visit (INDEPENDENT_AMBULATORY_CARE_PROVIDER_SITE_OTHER): Payer: Medicare Other | Admitting: Nurse Practitioner

## 2015-09-10 ENCOUNTER — Encounter: Payer: Self-pay | Admitting: Nurse Practitioner

## 2015-09-10 VITALS — BP 220/110 | HR 64 | Temp 98.0°F | Resp 16 | Ht 63.0 in | Wt 139.0 lb

## 2015-09-10 DIAGNOSIS — I10 Essential (primary) hypertension: Secondary | ICD-10-CM

## 2015-09-10 MED ORDER — AMLODIPINE BESYLATE 5 MG PO TABS: 5.0000 mg | ORAL_TABLET | Freq: Every day | ORAL | Status: DC

## 2015-09-10 NOTE — Assessment & Plan Note (Addendum)
BP Readings from Last 3 Encounters:  09/10/15 220/110  06/03/15 172/104  11/25/14 114/64   Very high today  EKG normal. Called in Amlodipine 5 mg  Asymptomatic today, Pt has had several of these in the past and 1 on 09/03/15 with similar findings and a headache- did not seek care. I feel confident that she can safely be lowered outpatient with amlodipine 5 mg and not clonidine to bring it down in a few hours. Pt was advised to avoid stress, take the medication ASAP and continue recording blood pressures.  UpToDate does not recommend admission for asymptomatic urgency HTN. She is to follow up next week with her primary care doctor and to seek emergency care via 911 if any symptoms occur. Do not attempt to drive self to hospital due to high possibility of stroke. Pt verbalized understanding

## 2015-09-10 NOTE — Progress Notes (Signed)
Patient ID: Alyssa Fernandez, female    DOB: November 09, 1940  Age: 75 y.o. MRN: [id number]  CC: Hypertension   HPI Alyssa Fernandez presents for CC of HTN.   1) Pt has had HTN uncontrolled in spurts for years  BP since Jan. 13th- Bad headache 217/101 57 pulse Normal on Jan 14th 136/72 60-pulse Jan 18th 162/99 65 Jan. 20th 185/116 61 dizzy this morning for a few minutes.   Husband has dementia and is at home- very stressed due to caring for him around the clock  Recently found out sister has cancer   30 oz water daily- urinating normally  Was on Amlodipine in the past, but stopped when 10 mg dosage gave her LE edema   History Alyssa Fernandez has a past medical history of Hyperlipidemia; Hypertension; Polycystic kidney disease; Anxiety; RENAL FAILURE; 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; HYPOTHYROIDISM; GERD; VITAMIN D DEFICIENCY; OSTEOPENIA; INSOMNIA; and Cervical spine degeneration.   She has past surgical history that includes Partial nephrectomy; Abdominal hysterectomy; Cervical disc surgery; Bunionectomy; fx rt ankle; left temporal bx artery-negative 2 ; bladder operation, but per sling procedure; 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; and percutaneous drainage of liver abscess (05/06/2012).   Her family history includes Stroke in her mother.She reports that she has never smoked. She has never used smokeless tobacco. She reports that she does not drink alcohol or use illicit drugs.  Outpatient Prescriptions Prior to Visit  Medication Sig Dispense Refill  . ALPRAZolam (XANAX) 0.5 MG tablet TAKE ONE TABLET THREE TIMES DAILY AS NEEDED 90 tablet 2  . amphetamine-dextroamphetamine (ADDERALL) 10 MG tablet Take 1 tablet (10 mg total) by mouth 2 (two) times daily. 60 tablet 0  . estrogens, conjugated, (PREMARIN) 0.625 MG tablet Take daily for 21 days then do not take for 7 days. 63 tablet 3  . HYDROcodone-acetaminophen (NORCO/VICODIN) 5-325 MG per tablet Take 1 tablet by mouth daily as needed for moderate  pain. 20 tablet 0  . labetalol (NORMODYNE) 200 MG tablet Take 1 tablet (200 mg total) by mouth 2 (two) times daily. 180 tablet 3  . rosuvastatin (CRESTOR) 20 MG tablet Take 1 tablet (20 mg total) by mouth daily. 90 tablet 3  . sertraline (ZOLOFT) 100 MG tablet Take 1 tablet (100 mg total) by mouth 2 (two) times daily. 60 tablet 11  . telmisartan (MICARDIS) 40 MG tablet Take 1 tablet (40 mg total) by mouth daily. 90 tablet 3  . calcium carbonate (OS-CAL) 1250 MG tablet Take 1 tablet by mouth daily.       No facility-administered medications prior to visit.    ROS Review of Systems  Constitutional: Negative for fever, chills, diaphoresis and fatigue.  Respiratory: Negative for chest tightness, shortness of breath and wheezing.   Cardiovascular: Negative for chest pain, palpitations and leg swelling.  Gastrointestinal: Negative for nausea, vomiting and diarrhea.  Skin: Negative for rash.  Neurological: Positive for headaches. Negative for dizziness, weakness and numbness.       Resolved today  Psychiatric/Behavioral: Negative for sleep disturbance. The patient is nervous/anxious.     Objective:  BP 220/110 mmHg  Pulse 64  Temp(Src) 98 F (36.7 C) (Oral)  Resp 16  Ht 5\' 3"  (1.6 m)  Wt 139 lb (63.05 kg)  BMI 24.63 kg/m2  SpO2 97%  Physical Exam  Constitutional: She is oriented to person, place, and time. She appears well-developed and well-nourished. No distress.  HENT:  Head: Normocephalic and atraumatic.  Right Ear: External ear normal.  Left Ear:  External ear normal.  Eyes: EOM are normal. Pupils are equal, round, and reactive to light. Right eye exhibits no discharge. Left eye exhibits no discharge. No scleral icterus.  Cardiovascular: Normal rate, regular rhythm and normal heart sounds.  Exam reveals no gallop and no friction rub.   No murmur heard. Bradycardia when lying for EKG No edema of lower extremities  Pulmonary/Chest: Effort normal and breath sounds normal. No  respiratory distress. She has no wheezes. She has no rales. She exhibits no tenderness.  Neurological: She is alert and oriented to person, place, and time. No cranial nerve deficit. She exhibits normal muscle tone. Coordination normal.  Skin: Skin is warm and dry. No rash noted. She is not diaphoretic.  Psychiatric: She has a normal mood and affect. Her behavior is normal. Judgment and thought content normal.   Assessment & Plan:   Alyssa Fernandez was seen today for hypertension.  Diagnoses and all orders for this visit:  Essential hypertension -     EKG 12-Lead  Other orders -     amLODipine (NORVASC) 5 MG tablet; Take 1 tablet (5 mg total) by mouth daily.   I have discontinued Alyssa Fernandez's calcium carbonate and amLODipine. I am also having her start on amLODipine. Additionally, I am having her maintain her HYDROcodone-acetaminophen, sertraline, labetalol, estrogens (conjugated), amphetamine-dextroamphetamine, ALPRAZolam, telmisartan, and rosuvastatin.  Meds ordered this encounter  Medications  . amLODipine (NORVASC) 5 MG tablet    Sig: Take 1 tablet (5 mg total) by mouth daily.    Dispense:  30 tablet    Refill:  0    Order Specific Question:  Supervising Provider    Answer:  Crecencio Mc [[id number]     Follow-up: Return in about 1 week (around 09/17/2015) for HTN follow up.

## 2015-09-10 NOTE — Progress Notes (Signed)
Pre visit review using our clinic review tool, if applicable. No additional management support is needed unless otherwise documented below in the visit note. 

## 2015-09-10 NOTE — Patient Instructions (Signed)
Take the Amlodipine 5 mg once daily (starting today), take only if blood pressure is 150/90 or above. Follow up with your physician.

## 2015-09-17 ENCOUNTER — Encounter: Payer: Self-pay | Admitting: Internal Medicine

## 2015-09-17 ENCOUNTER — Ambulatory Visit (INDEPENDENT_AMBULATORY_CARE_PROVIDER_SITE_OTHER): Payer: Medicare Other | Admitting: Internal Medicine

## 2015-09-17 VITALS — BP 138/86 | HR 60 | Temp 98.6°F | Resp 20 | Ht 63.0 in | Wt 139.0 lb

## 2015-09-17 DIAGNOSIS — R413 Other amnesia: Secondary | ICD-10-CM

## 2015-09-17 DIAGNOSIS — E039 Hypothyroidism, unspecified: Secondary | ICD-10-CM | POA: Diagnosis not present

## 2015-09-17 DIAGNOSIS — I10 Essential (primary) hypertension: Secondary | ICD-10-CM | POA: Diagnosis not present

## 2015-09-17 DIAGNOSIS — G300 Alzheimer's disease with early onset: Secondary | ICD-10-CM | POA: Insufficient documentation

## 2015-09-17 DIAGNOSIS — F0390 Unspecified dementia without behavioral disturbance: Secondary | ICD-10-CM | POA: Insufficient documentation

## 2015-09-17 DIAGNOSIS — G3184 Mild cognitive impairment, so stated: Secondary | ICD-10-CM | POA: Insufficient documentation

## 2015-09-17 MED ORDER — LABETALOL HCL 300 MG PO TABS: 300.0000 mg | ORAL_TABLET | Freq: Two times a day (BID) | ORAL | Status: DC

## 2015-09-17 MED ORDER — DONEPEZIL HCL 5 MG PO TABS: 5.0000 mg | ORAL_TABLET | Freq: Every day | ORAL | Status: DC

## 2015-09-17 NOTE — Progress Notes (Signed)
Pre visit review using our clinic review tool, if applicable. No additional management support is needed unless otherwise documented below in the visit note. 

## 2015-09-17 NOTE — Patient Instructions (Addendum)
Ok to stop the labetolol 200 mg  OK to increase the labetolol to 300 mg twice per day  Please take all new medication as prescribed - the aricept at 5 mg per day  Please continue all other medications as before, including the new amlodipine at 5 mg per day  (no need to take 10 mg for now)  Please have the pharmacy call with any other refills you may need.  Please continue your efforts at being more active, low cholesterol diet, and weight control.  Please keep your appointments with your specialists as you may have planned

## 2015-09-17 NOTE — Progress Notes (Signed)
Subjective:    Patient ID: Alyssa Fernandez, female    DOB: 1940/10/06, 75 y.o.   MRN: [id number]  HPI  Here to f/u 1 wk after started amlod 5 mg for severe recent htn, tolerating ok.  Did take 2 yestserday as BP seemed to stay > 150/90.  However, recalls she likely had swelling when took amlod in the past, thinks may have been the 10 mg.  Pt denies chest pain, increased sob or doe, wheezing, orthopnea, PND, increased LE swelling, palpitations, dizziness or syncope.  Pt denies new neurological symptoms such as new headache, or facial or extremity weakness or numbness   Pt denies polydipsia, polyuria,   Also, much more stress lately due to family health issues, and husband with dementia.  Only takes the xanax prn, tends to make her tired.  Uses the adderall only with driving and has to drive her husband to [place] for visits sometimes. 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  Asks for aricept due to worsening memory issues, more noticeable probably a bit worse then benign forgetfulness in last 6 mo.  Declines MRI or referral due to husband dementia.  Denies hyper or hypo thyroid symptoms such as voice, skin or hair change. Past Medical History  Diagnosis Date  . Hyperlipidemia   . Hypertension   . Polycystic kidney disease   . Anxiety   . RENAL FAILURE   . 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 HYPOTHYROIDISM   . GERD   . VITAMIN D DEFICIENCY   . OSTEOPENIA   . INSOMNIA   . 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 Partial nephrectomy    . Abdominal hysterectomy    . Cervical disc surgery    . Bunionectomy    . Fx rt ankle    . Left temporal bx artery-negative 2     . Bladder operation, but per sling procedure    . 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 Percutaneous drainage of liver abscess  05/06/2012    reports that she has never smoked. She has never used  smokeless tobacco. She reports that she does not drink alcohol or use illicit drugs. family history includes Stroke in her mother. Allergies  Allergen Reactions  . Codeine   . Sulfonamide Derivatives   . Zocor [Simvastatin] Other (See Comments)    Cognitive decrease   Current Outpatient Prescriptions on File Prior to Visit  Medication Sig Dispense Refill  . ALPRAZolam (XANAX) 0.5 MG tablet TAKE ONE TABLET THREE TIMES DAILY AS NEEDED 90 tablet 2  . amLODipine (NORVASC) 5 MG tablet Take 1 tablet (5 mg total) by mouth daily. 30 tablet 0  . amphetamine-dextroamphetamine (ADDERALL) 10 MG tablet Take 1 tablet (10 mg total) by mouth 2 (two) times daily. 60 tablet 0  . estrogens, conjugated, (PREMARIN) 0.625 MG tablet Take daily for 21 days then do not take for 7 days. 63 tablet 3  . HYDROcodone-acetaminophen (NORCO/VICODIN) 5-325 MG per tablet Take 1 tablet by mouth daily as needed for moderate pain. 20 tablet 0  . rosuvastatin (CRESTOR) 20 MG tablet Take 1 tablet (20 mg total) by mouth daily. 90 tablet 3  . sertraline (ZOLOFT) 100 MG tablet Take 1 tablet (100 mg total) by mouth 2 (two) times daily. 60 tablet 11  .  telmisartan (MICARDIS) 40 MG tablet Take 1 tablet (40 mg total) by mouth daily. 90 tablet 3   No current facility-administered medications on file prior to visit.     Review of Systems  Constitutional: Negative for unusual diaphoresis or night sweats HENT: Negative for ringing in ear or discharge Eyes: Negative for double vision or worsening visual disturbance.  Respiratory: Negative for choking and stridor.   Gastrointestinal: Negative for vomiting or other signifcant bowel change Genitourinary: Negative for hematuria or change in urine volume.  Musculoskeletal: Negative for other MSK pain or swelling Skin: Negative for color change and worsening wound.  Neurological: Negative for tremors and numbness other than noted  Psychiatric/Behavioral: Negative for decreased  concentration or agitation other than above       Objective:   Physical Exam BP 138/86 mmHg  Pulse 60  Temp(Src) 98.6 F (37 C) (Oral)  Resp 20  Ht 5\' 3"  (1.6 m)  Wt 139 lb (63.05 kg)  BMI 24.63 kg/m2  SpO2 95% VS noted,  Constitutional: Pt appears in no significant distress HENT: Head: NCAT.  Right Ear: External ear normal.  Left Ear: External ear normal.  Eyes: . Pupils are equal, round, and reactive to light. Conjunctivae and EOM are normal Neck: Normal range of motion. Neck supple.  Cardiovascular: Normal rate and regular rhythm.   Pulmonary/Chest: Effort normal and breath sounds without rales or wheezing.  Abd:  Soft, NT, ND, + BS Neurological: Pt is alert. Not confused , motor grossly intact Skin: Skin is warm. No rash, no LE edema Psychiatric: Pt behavior is normal. No agitation.      Assessment & Plan:

## 2015-09-18 NOTE — Assessment & Plan Note (Signed)
stable overall by history and exam, recent data reviewed with pt, and pt to continue medical treatment as before,  to f/u any worsening symptoms or concerns Lab Results  Component Value Date   TSH 1.97 06/23/2015

## 2015-09-18 NOTE — Assessment & Plan Note (Signed)
prob mild cognitive impairment, declines further eval, ok for low dose aricept 5 qd,  to f/u any worsening symptoms or concerns

## 2015-09-18 NOTE — Assessment & Plan Note (Addendum)
Mild persistent elevated, o/w stable overall by history and exam, recent data reviewed with pt, and pt to continue medical treatment as before except to increase the labetolol to 300 bid, cont all other tx including the amlodipine at 5 mg,  to f/u any worsening symptoms or concerns BP Readings from Last 3 Encounters:  09/17/15 138/86  09/10/15 220/110  06/03/15 172/104

## 2015-10-05 ENCOUNTER — Other Ambulatory Visit: Payer: Self-pay

## 2015-10-05 MED ORDER — AMLODIPINE BESYLATE 5 MG PO TABS: 5.0000 mg | ORAL_TABLET | Freq: Every day | ORAL | Status: DC

## 2015-10-05 NOTE — Telephone Encounter (Signed)
Fax came to our office for refill, please advise for patient.  Thanks, Lavella Lemons, RN

## 2015-10-29 ENCOUNTER — Other Ambulatory Visit: Payer: Self-pay | Admitting: Internal Medicine

## 2015-11-10 ENCOUNTER — Other Ambulatory Visit: Payer: Self-pay | Admitting: Internal Medicine

## 2015-11-10 NOTE — Telephone Encounter (Signed)
Please advise, thanks.

## 2015-11-10 NOTE — Telephone Encounter (Signed)
Done hardcopy to Corinne  

## 2015-11-23 ENCOUNTER — Other Ambulatory Visit: Payer: Self-pay | Admitting: Internal Medicine

## 2015-12-02 ENCOUNTER — Ambulatory Visit (INDEPENDENT_AMBULATORY_CARE_PROVIDER_SITE_OTHER): Payer: Medicare Other | Admitting: Internal Medicine

## 2015-12-02 ENCOUNTER — Encounter: Payer: Self-pay | Admitting: Internal Medicine

## 2015-12-02 VITALS — BP 138/104 | HR 81 | Temp 98.3°F | Ht 63.0 in | Wt 137.0 lb

## 2015-12-02 DIAGNOSIS — R238 Other skin changes: Secondary | ICD-10-CM

## 2015-12-02 DIAGNOSIS — R413 Other amnesia: Secondary | ICD-10-CM

## 2015-12-02 DIAGNOSIS — E785 Hyperlipidemia, unspecified: Secondary | ICD-10-CM | POA: Diagnosis not present

## 2015-12-02 DIAGNOSIS — E039 Hypothyroidism, unspecified: Secondary | ICD-10-CM | POA: Diagnosis not present

## 2015-12-02 DIAGNOSIS — R233 Spontaneous ecchymoses: Secondary | ICD-10-CM

## 2015-12-02 DIAGNOSIS — I10 Essential (primary) hypertension: Secondary | ICD-10-CM

## 2015-12-02 HISTORY — DX: Spontaneous ecchymoses: R23.3

## 2015-12-02 MED ORDER — ALPRAZOLAM 0.5 MG PO TABS: 0.5000 mg | ORAL_TABLET | Freq: Every evening | ORAL | Status: DC | PRN

## 2015-12-02 NOTE — Progress Notes (Addendum)
Subjective:    Patient ID: Alyssa Fernandez, female    DOB: 1941-07-21, 75 y.o.   MRN: [id number]  HPI    Here to f/u; overall doing ok,  Pt denies chest pain, increasing sob or doe, wheezing, orthopnea, PND, increased LE swelling, palpitations, dizziness or syncope.  Pt denies new neurological symptoms such as new headache, or facial or extremity weakness or numbness.  Pt denies polydipsia, polyuria, or low sugar episode.   Pt denies new neurological symptoms such as new headache, or facial or extremity weakness or numbness.   Pt states overall good compliance with meds, mostly trying to follow appropriate diet, with wt overall stable,  but little exercise however.   BP Readings from Last 3 Encounters:  12/02/15 138/104  09/17/15 138/86  09/10/15 220/110  mentions only takes the xanax prn at bedtime and not very often, does not want rx today.  Very miuch wants to cont the premarin and understands risk of clotting and malignancy. Wants to try off the aricept as no longer wants this type of tx, doesn't see memory as a more signifcant issue.  Has bilat shoulder surgury, pt trying to avoid surgury, has pain management per Dr Nelva Bush.  Did have a picture frame fall with a glancing blow to the right temple /right upper eye area without significant laceration or bleeding or swelling, but has a "raccoon eye" where the bruising seems to have moved around the eye when she has been up and around,  Has stopped ALL of her meds for the last 5 days in case any of them was making that and her recurring arm bruising worse. No longer has significant leg cramps, wants to try off the K supplement.  Has o/w been takiing the crestor. Denies hyper or hypo thyroid symptoms such as voice, skin or hair change. Past Medical History  Diagnosis Date  . Hyperlipidemia   . Hypertension   . Polycystic kidney disease   . Anxiety   . RENAL FAILURE   . 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 HYPOTHYROIDISM   . GERD   .  VITAMIN D DEFICIENCY   . OSTEOPENIA   . INSOMNIA   . 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 Partial nephrectomy    . Abdominal hysterectomy    . Cervical disc surgery    . Bunionectomy    . Fx rt ankle    . Left temporal bx artery-negative 2     . Bladder operation, but per sling procedure    . 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 Percutaneous drainage of liver abscess  05/06/2012    reports that she has never smoked. She has never used smokeless tobacco. She reports that she does not drink alcohol or use illicit drugs. family history includes Stroke in her mother. Allergies  Allergen Reactions  . Codeine   . Sulfonamide Derivatives   . Zocor [Simvastatin] Other (See Comments)    Cognitive decrease   Current Outpatient Prescriptions on File Prior to Visit  Medication Sig Dispense Refill  . amLODipine (NORVASC) 5 MG tablet Take 1 tablet (5 mg total) by mouth daily. 90 tablet 3  . amphetamine-dextroamphetamine (ADDERALL) 10 MG tablet Take 1 tablet (10 mg total) by mouth 2 (two) times daily. 60 tablet 0  . donepezil (ARICEPT) 5 MG tablet Take 1 tablet (5 mg total) by mouth daily. [place]  tablet 3  . estrogens, conjugated, (PREMARIN) 0.625 MG tablet Take daily for 21 days then do not take for 7 days. 63 tablet 3  . HYDROcodone-acetaminophen (NORCO/VICODIN) 5-325 MG per tablet Take 1 tablet by mouth daily as needed for moderate pain. 20 tablet 0  . labetalol (NORMODYNE) 300 MG tablet Take 1 tablet (300 mg total) by mouth 2 (two) times daily. 60 tablet 11  . pantoprazole (PROTONIX) 40 MG tablet TAKE ONE TABLET BY MOUTH ONCE DAILY 90 tablet 0  . rosuvastatin (CRESTOR) 20 MG tablet Take 1 tablet (20 mg total) by mouth daily. 90 tablet 3  . sertraline (ZOLOFT) 100 MG tablet Take 1 tablet (100 mg total) by mouth 2 (two) times daily. 60 tablet 11  . telmisartan (MICARDIS) 40 MG tablet Take 1 tablet (40 mg total) by mouth daily. 90  tablet 3   No current facility-administered medications on file prior to visit.     Review of Systems  Constitutional: Negative for unusual diaphoresis or night sweats HENT: Negative for ear swelling or discharge Eyes: Negative for worsening visual haziness  Respiratory: Negative for choking and stridor.   Gastrointestinal: Negative for distension or worsening eructation Genitourinary: Negative for retention or change in urine volume.  Musculoskeletal: Negative for other MSK pain or swelling Skin: Negative for color change and worsening wound Neurological: Negative for tremors and numbness other than noted  Psychiatric/Behavioral: Negative for decreased concentration or agitation other than above       Objective:   Physical Exam BP 138/104 mmHg  Pulse 81  Temp(Src) 98.3 F (36.8 C) (Oral)  Ht 5\' 3"  (1.6 m)  Wt 137 lb (62.143 kg)  BMI 24.27 kg/m2  SpO2 98% VS noted,  Constitutional: Pt appears in no apparent distress HENT: Head: NCAT.  Right Ear: External ear normal.  Left Ear: External ear normal.  Eyes: . Pupils are equal, round, and reactive to light. Conjunctivae and EOM are normal Neck: Normal range of motion. Neck supple.  Cardiovascular: Normal rate and regular rhythm.   Pulmonary/Chest: Effort normal and breath sounds without rales or wheezing.  Abd:  Soft, NT, ND, + BS Neurological: Pt is alert. Not confused , motor grossly intact Skin: Skin is warm. No rash, no LE edema, has several bruises to forearms, also mild to mod diffuse right periocular bruising noted as well Psychiatric: Pt behavior is normal. No agitation.     Assessment & Plan:

## 2015-12-02 NOTE — Progress Notes (Signed)
Pre visit review using our clinic review tool, if applicable. No additional management support is needed unless otherwise documented below in the visit note. 

## 2015-12-02 NOTE — Assessment & Plan Note (Signed)
Encouraged pt to restart asa daily,  to f/u any worsening symptoms or concerns

## 2015-12-02 NOTE — Assessment & Plan Note (Signed)
stable overall by history and exam, recent data reviewed with pt, and pt to continue medical treatment as before,  to f/u any worsening symptoms or concerns Lab Results  Component Value Date   TSH 1.97 06/23/2015

## 2015-12-02 NOTE — Patient Instructions (Signed)
OK to re-start all of the medications we discussed today (see the list as you leave today)  Please have the pharmacy call with any other refills you may need.  Please continue your efforts at being more active, low cholesterol diet, and weight control.  Please keep your appointments with your specialists as you may have planned  Please return for your scheduled 6 mo visit

## 2015-12-02 NOTE — Assessment & Plan Note (Signed)
Likely some mild cognitive impairment, declines to take the aricept for now, ok to cont to [place]ow

## 2015-12-02 NOTE — Assessment & Plan Note (Signed)
On new statin, has some leg cramps but not cleaer is assoc, ok for cont'd use, check lipids next lab draw Lab Results  Component Value Date   LDLCALC 161* 06/23/2015

## 2015-12-02 NOTE — Assessment & Plan Note (Signed)
Uncontrolled,  BP Readings from Last 3 Encounters:  12/02/15 138/104  09/17/15 138/86  09/10/15 220/110   To re-start meds, encouraged compliance

## 2015-12-16 ENCOUNTER — Ambulatory Visit: Payer: Medicare Other | Admitting: Internal Medicine

## 2016-03-06 ENCOUNTER — Other Ambulatory Visit: Payer: Self-pay | Admitting: Internal Medicine

## 2016-04-13 ENCOUNTER — Ambulatory Visit: Payer: Medicare Other

## 2016-04-27 ENCOUNTER — Encounter: Payer: Self-pay | Admitting: Internal Medicine

## 2016-05-04 ENCOUNTER — Ambulatory Visit (INDEPENDENT_AMBULATORY_CARE_PROVIDER_SITE_OTHER): Payer: Medicare Other | Admitting: Internal Medicine

## 2016-05-04 ENCOUNTER — Encounter: Payer: Self-pay | Admitting: Internal Medicine

## 2016-05-04 VITALS — BP 120/68 | HR 74 | Temp 97.9°F | Resp 20 | Wt 141.0 lb

## 2016-05-04 DIAGNOSIS — M545 Low back pain, unspecified: Secondary | ICD-10-CM

## 2016-05-04 DIAGNOSIS — G8929 Other chronic pain: Secondary | ICD-10-CM

## 2016-05-04 DIAGNOSIS — Z23 Encounter for immunization: Secondary | ICD-10-CM | POA: Diagnosis not present

## 2016-05-04 DIAGNOSIS — E039 Hypothyroidism, unspecified: Secondary | ICD-10-CM | POA: Diagnosis not present

## 2016-05-04 DIAGNOSIS — I10 Essential (primary) hypertension: Secondary | ICD-10-CM

## 2016-05-04 DIAGNOSIS — Z0001 Encounter for general adult medical examination with abnormal findings: Secondary | ICD-10-CM

## 2016-05-04 DIAGNOSIS — E785 Hyperlipidemia, unspecified: Secondary | ICD-10-CM

## 2016-05-04 HISTORY — DX: Low back pain, unspecified: M54.50

## 2016-05-04 HISTORY — DX: Low back pain, unspecified: G89.29

## 2016-05-04 MED ORDER — HYDROCODONE-ACETAMINOPHEN 5-325 MG PO TABS: 1.0000 | ORAL_TABLET | Freq: Two times a day (BID) | ORAL | 0 refills | Status: DC | PRN

## 2016-05-04 NOTE — Patient Instructions (Addendum)
You had the flu shot today  Please continue all other medications as before, and refills have been done if requested.  Please have the pharmacy call with any other refills you may need.  Please continue your efforts at being more active, low cholesterol diet, and weight control.  You are otherwise up to date with prevention measures today.  Please keep your appointments with your specialists as you may have planned  Please go to the LAB in the Basement (turn left off the elevator) for the tests to be done tomorrow  You will be contacted by phone if any changes need to be made immediately.  Otherwise, you will receive a letter about your results with an explanation, but please check with MyChart first.  Please remember to sign up for MyChart if you have not done so, as this will be important to you in the future with finding out test results, communicating by private email, and scheduling acute appointments online when needed.  Please return in 1 year for your yearly visit, or sooner if needed, with Lab testing done 3-5 days before

## 2016-05-04 NOTE — Progress Notes (Signed)
Subjective:    Patient ID: Alyssa Fernandez, female    DOB: 1940-10-26, 75 y.o.   MRN: [id number]  HPI  Here for wellness and f/u;  Overall doing ok;  Pt denies Chest pain, worsening SOB, DOE, wheezing, orthopnea, PND, worsening LE edema, palpitations, dizziness or syncope.  Pt denies neurological change such as new headache, facial or extremity weakness.  Pt denies polydipsia, polyuria, or low sugar symptoms. Pt states overall good compliance with treatment and medications, good tolerability, and has been trying to follow appropriate diet.  Pt denies worsening depressive symptoms, suicidal ideation or panic. No fever, night sweats, wt loss, loss of appetite, or other constitutional symptoms.  Pt states good ability with ADL's, has low fall risk, home safety reviewed and adequate, no other significant changes in hearing or vision, and only occasionally active with exercise.  Pt continues to have recurring LBP without change in severity, bowel or bladder change, fever, wt loss,  worsening LE pain/numbness/weakness, gait change or falls.  Has seen Dr Nelva Bush, who also wants to avoid nsaid due to renal risk, has hx of PCKD.  Last rx for hydrocodon was #60, lasted one yr at prn use.  Also has coritsone to right shoulder last yr with improved fxn.  Not seeing him further now unless needs to.  Denies hyper or hypo thyroid symptoms such as voice, skin or hair change. Also has good compliance with statin, no MSK symptoms or joint pain.   Past Medical History:  Diagnosis Date  . Anxiety   . 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 GERD   . Hyperlipidemia   . Hypertension   . HYPOTHYROIDISM   . INSOMNIA   . 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 OSTEOPENIA   . Polycystic kidney disease   . RENAL FAILURE   . VITAMIN D DEFICIENCY    Past Surgical History:  Procedure Laterality Date  . ABDOMINAL HYSTERECTOMY    . 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 bladder operation, but per sling  procedure    . BUNIONECTOMY    . CERVICAL DISC SURGERY    . fx rt ankle    . left temporal bx artery-negative 2     . PARTIAL NEPHRECTOMY    . percutaneous drainage of liver abscess  05/06/2012    reports that she has never smoked. She has never used smokeless tobacco. She reports that she does not drink alcohol or use drugs. family history includes Stroke in her mother. Allergies  Allergen Reactions  . Codeine   . Sulfonamide Derivatives   . Zocor [Simvastatin] Other (See Comments)    Cognitive decrease   Current Outpatient Prescriptions on File Prior to Visit  Medication Sig Dispense Refill  . ALPRAZolam (XANAX) 0.5 MG tablet Take 1 tablet (0.5 mg total) by mouth at bedtime as needed for anxiety. 1 tablet 0  . amLODipine (NORVASC) 5 MG tablet Take 1 tablet (5 mg total) by mouth daily. 90 tablet 3  . amphetamine-dextroamphetamine (ADDERALL) 10 MG tablet Take 1 tablet (10 mg total) by mouth 2 (two) times daily. 60 tablet 0  . donepezil (ARICEPT) 5 MG tablet Take 1 tablet (5 mg total) by mouth daily. 90 tablet 3  . estrogens, conjugated, (PREMARIN) 0.625 MG tablet Take daily for 21 days then do not take for 7 days. 63 tablet 3  . labetalol (NORMODYNE) 300 MG tablet Take 1 tablet (300 mg total) by mouth  2 (two) times daily. 60 tablet 11  . pantoprazole (PROTONIX) 40 MG tablet TAKE ONE TABLET BY MOUTH ONCE DAILY 90 tablet 1  . rosuvastatin (CRESTOR) 20 MG tablet Take 1 tablet (20 mg total) by mouth daily. 90 tablet 3  . sertraline (ZOLOFT) 100 MG tablet Take 1 tablet (100 mg total) by mouth 2 (two) times daily. 60 tablet 11  . telmisartan (MICARDIS) 40 MG tablet Take 1 tablet (40 mg total) by mouth daily. 90 tablet 3   No current facility-administered medications on file prior to visit.    Review of Systems Constitutional: Negative for increased diaphoresis, or other activity, appetite or siginficant weight change other than noted HENT: Negative for worsening hearing loss, ear pain,  facial swelling, mouth sores and neck stiffness.   Eyes: Negative for other worsening pain, redness or visual disturbance.  Respiratory: Negative for choking or stridor Cardiovascular: Negative for other chest pain and palpitations.  Gastrointestinal: Negative for worsening diarrhea, blood in stool, or abdominal distention Genitourinary: Negative for hematuria, flank pain or change in urine volume.  Musculoskeletal: Negative for myalgias or other joint complaints.  Skin: Negative for other color change and wound or drainage.  Neurological: Negative for syncope and numbness. other than noted Hematological: Negative for adenopathy. or other swelling Psychiatric/Behavioral: Negative for hallucinations, SI, self-injury, decreased concentration or other worsening agitation.      Objective:   Physical Exam BP 120/68   Pulse 74   Temp 97.9 F (36.6 C) (Oral)   Resp 20   Wt 141 lb (64 kg)   SpO2 94%   BMI 24.98 kg/m  VS noted,  Constitutional: Pt is oriented to person, place, and time. Appears well-developed and well-nourished, in no significant distress Head: Normocephalic and atraumatic  Eyes: Conjunctivae and EOM are normal. Pupils are equal, round, and reactive to light Right Ear: External ear normal.  Left Ear: External ear normal Nose: Nose normal.  Mouth/Throat: Oropharynx is clear and moist  Neck: Normal range of motion. Neck supple. No JVD present. No tracheal deviation present or significant neck LA or mass Cardiovascular: Normal rate, regular rhythm, normal heart sounds and intact distal pulses.   Pulmonary/Chest: Effort normal and breath sounds without rales or wheezing  Abdominal: Soft. Bowel sounds are normal. NT. No HSM  Musculoskeletal: Normal range of motion. Exhibits no edema, spine nontender midline, has bilat lumbar paravertebral tender Lymphadenopathy: Has no cervical adenopathy.  Neurological: Pt is alert and oriented to person, place, and time. Pt has normal  reflexes. No cranial nerve deficit. Motor grossly intact Skin: Skin is warm and dry. No rash noted or new ulcers Psychiatric:  Has normal mood and affect. Behavior is normal.     Assessment & Plan:

## 2016-05-04 NOTE — Progress Notes (Signed)
Pre visit review using our clinic review tool, if applicable. No additional management support is needed unless otherwise documented below in the visit note. 

## 2016-05-05 ENCOUNTER — Other Ambulatory Visit (INDEPENDENT_AMBULATORY_CARE_PROVIDER_SITE_OTHER): Payer: Medicare Other

## 2016-05-05 DIAGNOSIS — R6889 Other general symptoms and signs: Secondary | ICD-10-CM

## 2016-05-05 DIAGNOSIS — Z0001 Encounter for general adult medical examination with abnormal findings: Secondary | ICD-10-CM | POA: Diagnosis not present

## 2016-05-05 LAB — CBC WITH DIFFERENTIAL/PLATELET
BASOS PCT: 0.4 % (ref 0.0–3.0)
Basophils Absolute: 0 10*3/uL (ref 0.0–0.1)
EOS ABS: 0.3 10*3/uL (ref 0.0–0.7)
EOS PCT: 4.2 % (ref 0.0–5.0)
HEMATOCRIT: 37.8 % (ref 36.0–46.0)
HEMOGLOBIN: 12.7 g/dL (ref 12.0–15.0)
LYMPHS PCT: 16.3 % (ref 12.0–46.0)
Lymphs Abs: 1 10*3/uL (ref 0.7–4.0)
MCHC: 33.6 g/dL (ref 30.0–36.0)
MCV: 87.8 fl (ref 78.0–100.0)
MONOS PCT: 8.3 % (ref 3.0–12.0)
Monocytes Absolute: 0.5 10*3/uL (ref 0.1–1.0)
Neutro Abs: 4.5 10*3/uL (ref 1.4–7.7)
Neutrophils Relative %: 70.8 % (ref 43.0–77.0)
Platelets: 218 10*3/uL (ref 150.0–400.0)
RBC: 4.31 Mil/uL (ref 3.87–5.11)
RDW: 13.7 % (ref 11.5–15.5)
WBC: 6.3 10*3/uL (ref 4.0–10.5)

## 2016-05-05 LAB — URINALYSIS, ROUTINE W REFLEX MICROSCOPIC
BILIRUBIN URINE: NEGATIVE
HGB URINE DIPSTICK: NEGATIVE
Ketones, ur: NEGATIVE
Leukocytes, UA: NEGATIVE
NITRITE: NEGATIVE
RBC / HPF: NONE SEEN (ref 0–?)
Specific Gravity, Urine: 1.01 (ref 1.000–1.030)
TOTAL PROTEIN, URINE-UPE24: NEGATIVE
Urine Glucose: NEGATIVE
Urobilinogen, UA: 0.2 (ref 0.0–1.0)
pH: 6.5 (ref 5.0–8.0)

## 2016-05-05 LAB — LIPID PANEL
CHOL/HDL RATIO: 3
Cholesterol: 168 mg/dL (ref 0–200)
HDL: 57.1 mg/dL (ref >39.00)
LDL Cholesterol: 93 mg/dL (ref 0–99)
NONHDL: 111.1
Triglycerides: 91 mg/dL (ref 0.0–149.0)
VLDL: 18.2 mg/dL (ref 0.0–40.0)

## 2016-05-05 LAB — BASIC METABOLIC PANEL
BUN: 20 mg/dL (ref 6–23)
CALCIUM: 8.7 mg/dL (ref 8.4–10.5)
CHLORIDE: 105 meq/L (ref 96–112)
CO2: 27 meq/L (ref 19–32)
CREATININE: 1 mg/dL (ref 0.40–1.20)
GFR: 57.13 mL/min — AB (ref >60.00)
Glucose, Bld: 93 mg/dL (ref 70–99)
POTASSIUM: 3.8 meq/L (ref 3.5–5.1)
Sodium: 141 mEq/L (ref 135–145)

## 2016-05-05 LAB — HEPATIC FUNCTION PANEL
ALT: 12 U/L (ref 0–35)
AST: 15 U/L (ref 0–37)
Albumin: 4 g/dL (ref 3.5–5.2)
Alkaline Phosphatase: 54 U/L (ref 39–117)
BILIRUBIN TOTAL: 0.6 mg/dL (ref 0.2–1.2)
Bilirubin, Direct: 0.1 mg/dL (ref 0.0–0.3)
Total Protein: 6.5 g/dL (ref 6.0–8.3)

## 2016-05-05 LAB — TSH: TSH: 2.65 u[IU]/mL (ref 0.35–4.50)

## 2016-05-07 NOTE — Assessment & Plan Note (Signed)
stable overall by history and exam, recent data reviewed with pt, and pt to continue medical treatment as before,  to f/u any worsening symptoms or concerns BP Readings from Last 3 Encounters:  05/04/16 120/68  12/02/15 (!) 138/104  09/17/15 138/86

## 2016-05-07 NOTE — Assessment & Plan Note (Signed)
Tolerating new statin, to cont same tx, low chol diet, f/u lipids, goal LDL < 100

## 2016-05-07 NOTE — Assessment & Plan Note (Signed)
stable overall by history and exam, recent data reviewed with pt, and pt to continue medical treatment as before,  to f/u any worsening symptoms or concerns Lab Results  Component Value Date   TSH 2.65 05/05/2016

## 2016-05-07 NOTE — Assessment & Plan Note (Signed)

## 2016-05-07 NOTE — Assessment & Plan Note (Signed)
Stable, for pain med refill,  to f/u any worsening symptoms or concerns  In addition to the time spent performing CPE, I spent an additional 25 minutes face to face,in which greater than 50% of this time was spent in counseling and coordination of care for patient's acute illness as documented.

## 2016-06-05 ENCOUNTER — Other Ambulatory Visit: Payer: Self-pay | Admitting: Internal Medicine

## 2016-07-04 ENCOUNTER — Other Ambulatory Visit: Payer: Self-pay | Admitting: Internal Medicine

## 2016-07-26 ENCOUNTER — Telehealth: Payer: Self-pay | Admitting: Emergency Medicine

## 2016-07-26 MED ORDER — ALPRAZOLAM 0.5 MG PO TABS: 0.5000 mg | ORAL_TABLET | Freq: Every evening | ORAL | 1 refills | Status: DC | PRN

## 2016-07-26 NOTE — Telephone Encounter (Signed)
Pt came in and wants to know if she can get a refill on her ALPRAZolam (XANAX) 0.5 MG tablet. [facility]. Please advise thanks.

## 2016-07-26 NOTE — Telephone Encounter (Signed)
Done hardcopy to Corinne  

## 2016-07-27 NOTE — Telephone Encounter (Signed)
faxed

## 2016-09-14 ENCOUNTER — Other Ambulatory Visit: Payer: Self-pay | Admitting: Internal Medicine

## 2016-09-14 NOTE — Telephone Encounter (Signed)
Done erx 

## 2016-10-12 ENCOUNTER — Other Ambulatory Visit: Payer: Self-pay | Admitting: Internal Medicine

## 2016-10-12 NOTE — Telephone Encounter (Signed)
Routing to dr john, please advise, thanks 

## 2016-10-12 NOTE — Telephone Encounter (Signed)
Done hardcopy to [facility] - xanax  crestor done erx

## 2016-10-13 NOTE — Telephone Encounter (Signed)
RX faxed to [facility] 

## 2016-11-14 DIAGNOSIS — M12811 Other specific arthropathies, not elsewhere classified, right shoulder: Secondary | ICD-10-CM | POA: Diagnosis not present

## 2016-12-04 ENCOUNTER — Other Ambulatory Visit: Payer: Self-pay | Admitting: Internal Medicine

## 2016-12-04 NOTE — Telephone Encounter (Signed)
Please advise, thanks.

## 2016-12-05 NOTE — Telephone Encounter (Signed)
rx done erx and Done hardcopy to [employer]

## 2016-12-06 NOTE — Telephone Encounter (Signed)
Pt pick up script 4/18 at 2:39pm

## 2017-01-31 DIAGNOSIS — Z961 Presence of intraocular lens: Secondary | ICD-10-CM | POA: Diagnosis not present

## 2017-03-08 ENCOUNTER — Encounter (HOSPITAL_COMMUNITY): Payer: Self-pay | Admitting: Emergency Medicine

## 2017-03-08 ENCOUNTER — Ambulatory Visit (HOSPITAL_COMMUNITY)
Admission: EM | Admit: 2017-03-08 | Discharge: 2017-03-08 | Disposition: A | Discharge status: Home / Self Care | Payer: PPO | Attending: Family Medicine | Admitting: Family Medicine

## 2017-03-08 DIAGNOSIS — R300 Dysuria: Secondary | ICD-10-CM

## 2017-03-08 DIAGNOSIS — N8110 Cystocele, unspecified: Secondary | ICD-10-CM

## 2017-03-08 DIAGNOSIS — N390 Urinary tract infection, site not specified: Secondary | ICD-10-CM

## 2017-03-08 DIAGNOSIS — Z87448 Personal history of other diseases of urinary system: Secondary | ICD-10-CM | POA: Diagnosis not present

## 2017-03-08 LAB — POCT URINALYSIS DIP (DEVICE)
Bilirubin Urine: NEGATIVE
GLUCOSE, UA: NEGATIVE mg/dL
Hgb urine dipstick: NEGATIVE
KETONES UR: NEGATIVE mg/dL
Nitrite: POSITIVE — AB
PH: 6.5 (ref 5.0–8.0)
PROTEIN: NEGATIVE mg/dL
SPECIFIC GRAVITY, URINE: 1.01 (ref 1.005–1.030)
UROBILINOGEN UA: 0.2 mg/dL (ref 0.0–1.0)

## 2017-03-08 MED ORDER — CEPHALEXIN 500 MG PO CAPS: 500.0000 mg | ORAL_CAPSULE | Freq: Four times a day (QID) | ORAL | 0 refills | Status: DC

## 2017-03-08 NOTE — ED Provider Notes (Addendum)
CSN: [id number]     Arrival date & time 03/08/17  1203 History   First MD Initiated Contact with Patient 03/08/17 1255     Chief Complaint  Patient presents with  . Dysuria   (Consider location/radiation/quality/duration/timing/severity/associated sxs/prior Treatment) 76 year old female states that for the past couple weeks she has had some pelvic discomfort and the inability to urinate. She has minor dysuria and has an urge to urinate but no frequency. She states that often she will feel the need to void a large amount of urine but is difficult to pass the urine and empty the bladder. She also states that she has some sort of hernia but is unable to explain exactly what kind and wear the hernia is, she points to the pelvis. She went to see her PCP today and was sent to the urgent care.       Past Medical History:  Diagnosis Date  . Anxiety   . 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 GERD   . Hyperlipidemia   . Hypertension   . HYPOTHYROIDISM   . INSOMNIA   . 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 OSTEOPENIA   . Polycystic kidney disease   . RENAL FAILURE   . VITAMIN D DEFICIENCY    Past Surgical History:  Procedure Laterality Date  . ABDOMINAL HYSTERECTOMY    . 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 bladder operation, but per sling procedure    . BUNIONECTOMY    . CERVICAL DISC SURGERY    . fx rt ankle    . left temporal bx artery-negative 2     . PARTIAL NEPHRECTOMY    . percutaneous drainage of liver abscess  05/06/2012   Family History  Problem Relation Age of Onset  . Stroke Mother    Social History  Substance Use Topics  . Smoking status: Never Smoker  . Smokeless tobacco: Never Used  . Alcohol use No   OB History    No data available     Review of Systems  Constitutional: Negative for fatigue and fever.  HENT: Negative.   Respiratory: Negative.   Gastrointestinal: Negative.   Genitourinary: Positive for difficulty urinating,  pelvic pain and urgency. Negative for frequency and vaginal discharge.  Neurological: Negative.   All other systems reviewed and are negative.   Allergies  Codeine; Sulfonamide derivatives; and Zocor [simvastatin]  Home Medications   Prior to Admission medications   Medication Sig Start Date End Date Taking? Authorizing Provider  ALPRAZolam Duanne Moron) 0.5 MG tablet TAKE ONE TABLET AT BEDTIME AS NEEDED FORANXIETY 10/12/16  Yes Biagio Borg, MD  labetalol (NORMODYNE) 300 MG tablet TAKE ONE TABLET TWICE DAILY 09/14/16  Yes Biagio Borg, MD  pantoprazole (PROTONIX) 40 MG tablet TAKE ONE TABLET BY MOUTH ONCE DAILY 12/05/16  Yes Biagio Borg, MD  PREMARIN 0.625 MG tablet TAKE ONE TABLET EACH DAY FOR 21 DAYS THEN DO NOT TAKE FOR 7 DAYS 12/05/16  Yes Biagio Borg, MD  rosuvastatin (CRESTOR) 20 MG tablet TAKE ONE TABLET EVERY DAY 10/12/16  Yes Biagio Borg, MD  sertraline (ZOLOFT) 100 MG tablet TAKE ONE TABLET TWICE DAILY 09/14/16  Yes Biagio Borg, MD  telmisartan (MICARDIS) 40 MG tablet TAKE ONE TABLET EACH DAY 06/05/16  Yes Biagio Borg, MD  amLODipine (NORVASC) 5 MG tablet TAKE ONE TABLET EACH DAY 12/05/16   Biagio Borg, MD  amphetamine-dextroamphetamine (ADDERALL) 10 MG  tablet Take 1 tablet (10 mg total) by mouth 2 (two) times daily. 06/03/15   Biagio Borg, MD  cephALEXin (KEFLEX) 500 MG capsule Take 1 capsule (500 mg total) by mouth 4 (four) times daily. 03/08/17   Janne Napoleon, NP  donepezil (ARICEPT) 5 MG tablet Take 1 tablet (5 mg total) by mouth daily. 09/17/15   Biagio Borg, MD  HYDROcodone-acetaminophen (NORCO/VICODIN) 5-325 MG tablet TAKE ONE TABLET TWICE DAILY AS NEEDED FOR MODERATE PAIN 12/05/16   Biagio Borg, MD   Meds Ordered and Administered this Visit  Medications - No data to display  BP (!) 164/91 (BP Location: Right Arm)   Temp 98.5 F (36.9 C) (Oral)   SpO2 95%  No data found.   Physical Exam  Constitutional: She is oriented to person, place, and time. She appears  well-developed and well-nourished. No distress.  Neck: Neck supple.  Cardiovascular: Normal rate.   Pulmonary/Chest: Effort normal.  Abdominal: She exhibits no mass.  Anterior pelvic tenderness. Abdomen is soft and nontender.  Neurological: She is alert and oriented to person, place, and time.  Skin: Skin is warm.  Psychiatric: She has a normal mood and affect.  Nursing note and vitals reviewed.   Urgent Care Course     Procedures (including critical care time)  Labs Review Labs Reviewed  POCT URINALYSIS DIP (DEVICE) - Abnormal; Notable for the following:       Result Value   Nitrite POSITIVE (*)    Leukocytes, UA LARGE (*)    All other components within normal limits    Imaging Review No results found.   Visual Acuity Review  Right Eye Distance:   Left Eye Distance:   Bilateral Distance:    Right Eye Near:   Left Eye Near:    Bilateral Near:         MDM   1. Lower urinary tract infectious disease   2. Female cystocele   3. History of cystocele     Bladder cath for residual. Proximally 115 cc of urine obtained. Visual and manual inspection of the vagina reveals a cystocele. Urinalysis positive for leukocytes and nitrites. Likely urine stasis is causing a UTI as well as difficulty and urinating. Patient states she has a urologist and will follow up and call for appointment. Meds ordered this encounter  Medications  . cephALEXin (KEFLEX) 500 MG capsule    Sig: Take 1 capsule (500 mg total) by mouth 4 (four) times daily.    Dispense:  28 capsule    Refill:  0    Order Specific Question:   Supervising Provider    Answer:   Vanessa Kick [[id number]]  charlotte,RN present for vaginal/bladder exam.      Janne Napoleon, NP 03/08/17 1343    Janne Napoleon, NP 03/08/17 1344

## 2017-03-08 NOTE — Discharge Instructions (Signed)
Take the antibiotic as directed. Be sure to drink plenty of fluids. You must call your urologist soon to make an appointment. It appears that your bladder has fallen again. This may be contributing to your symptoms.

## 2017-03-08 NOTE — ED Triage Notes (Signed)
Pt states she has had urgency of urination with little or no output along with some pain with urination.  Pt states she has a hernia that protrudes into her uterus.

## 2017-03-14 ENCOUNTER — Encounter: Payer: Self-pay | Admitting: Internal Medicine

## 2017-03-19 DIAGNOSIS — N393 Stress incontinence (female) (male): Secondary | ICD-10-CM | POA: Diagnosis not present

## 2017-03-23 ENCOUNTER — Encounter: Payer: Self-pay | Admitting: Internal Medicine

## 2017-03-29 ENCOUNTER — Encounter: Payer: Self-pay | Admitting: Internal Medicine

## 2017-03-30 NOTE — Telephone Encounter (Signed)
shirron to notify pt  - see above mychart message reply

## 2017-04-02 ENCOUNTER — Encounter: Payer: Self-pay | Admitting: Internal Medicine

## 2017-04-03 ENCOUNTER — Other Ambulatory Visit (INDEPENDENT_AMBULATORY_CARE_PROVIDER_SITE_OTHER): Payer: PPO

## 2017-04-03 DIAGNOSIS — Z0001 Encounter for general adult medical examination with abnormal findings: Secondary | ICD-10-CM

## 2017-04-03 LAB — LIPID PANEL
CHOL/HDL RATIO: 3
CHOLESTEROL: 128 mg/dL (ref 0–200)
HDL: 47.4 mg/dL (ref >39.00)
LDL CALC: 50 mg/dL (ref 0–99)
NonHDL: 80.91
TRIGLYCERIDES: 155 mg/dL — AB (ref 0.0–149.0)
VLDL: 31 mg/dL (ref 0.0–40.0)

## 2017-04-03 LAB — CBC WITH DIFFERENTIAL/PLATELET
Basophils Absolute: 0 10*3/uL (ref 0.0–0.1)
Basophils Relative: 0.7 % (ref 0.0–3.0)
Eosinophils Absolute: 0 10*3/uL (ref 0.0–0.7)
Eosinophils Relative: 0.9 % (ref 0.0–5.0)
HEMATOCRIT: 39.9 % (ref 36.0–46.0)
HEMOGLOBIN: 13.4 g/dL (ref 12.0–15.0)
LYMPHS PCT: 15.2 % (ref 12.0–46.0)
Lymphs Abs: 0.8 10*3/uL (ref 0.7–4.0)
MCHC: 33.6 g/dL (ref 30.0–36.0)
MCV: 89.3 fl (ref 78.0–100.0)
MONO ABS: 0.4 10*3/uL (ref 0.1–1.0)
Monocytes Relative: 8.4 % (ref 3.0–12.0)
Neutro Abs: 4 10*3/uL (ref 1.4–7.7)
Neutrophils Relative %: 74.8 % (ref 43.0–77.0)
Platelets: 201 10*3/uL (ref 150.0–400.0)
RBC: 4.47 Mil/uL (ref 3.87–5.11)
RDW: 13.7 % (ref 11.5–15.5)
WBC: 5.3 10*3/uL (ref 4.0–10.5)

## 2017-04-03 LAB — HEPATIC FUNCTION PANEL
ALBUMIN: 4.4 g/dL (ref 3.5–5.2)
ALT: 15 U/L (ref 0–35)
AST: 22 U/L (ref 0–37)
Alkaline Phosphatase: 56 U/L (ref 39–117)
Bilirubin, Direct: 0.3 mg/dL (ref 0.0–0.3)
Total Bilirubin: 1.2 mg/dL (ref 0.2–1.2)
Total Protein: 6.2 g/dL (ref 6.0–8.3)

## 2017-04-03 LAB — URINALYSIS, ROUTINE W REFLEX MICROSCOPIC
BILIRUBIN URINE: NEGATIVE
Hgb urine dipstick: NEGATIVE
KETONES UR: NEGATIVE
NITRITE: NEGATIVE
PH: 7.5 (ref 5.0–8.0)
RBC / HPF: NONE SEEN (ref 0–?)
Specific Gravity, Urine: 1.015 (ref 1.000–1.030)
Urine Glucose: NEGATIVE
Urobilinogen, UA: 0.2 (ref 0.0–1.0)

## 2017-04-03 LAB — BASIC METABOLIC PANEL
BUN: 16 mg/dL (ref 6–23)
CALCIUM: 9 mg/dL (ref 8.4–10.5)
CO2: 31 mEq/L (ref 19–32)
CREATININE: 1 mg/dL (ref 0.40–1.20)
Chloride: 99 mEq/L (ref 96–112)
GFR: 57 mL/min — AB (ref >60.00)
Glucose, Bld: 101 mg/dL — ABNORMAL HIGH (ref 70–99)
Potassium: 3.7 mEq/L (ref 3.5–5.1)
Sodium: 140 mEq/L (ref 135–145)

## 2017-04-03 LAB — TSH: TSH: 3.72 u[IU]/mL (ref 0.35–4.50)

## 2017-04-03 NOTE — Telephone Encounter (Signed)
Shirron - pt needs reiteration and reinforcment of aug 9 notes regarding the recommended evaluation for her dark stools

## 2017-04-04 ENCOUNTER — Other Ambulatory Visit: Payer: Self-pay | Admitting: Internal Medicine

## 2017-04-04 MED ORDER — AMPHETAMINE-DEXTROAMPHETAMINE 10 MG PO TABS: 10.0000 mg | ORAL_TABLET | Freq: Two times a day (BID) | ORAL | 0 refills | Status: DC

## 2017-04-04 NOTE — Telephone Encounter (Signed)
Done hardcopy to Shirron  Please ask pt to make ROV for furthter refills

## 2017-04-04 NOTE — Telephone Encounter (Signed)
Informed pt Script at front desk  

## 2017-04-13 ENCOUNTER — Emergency Department (HOSPITAL_COMMUNITY): Payer: PPO

## 2017-04-13 ENCOUNTER — Encounter (HOSPITAL_COMMUNITY): Payer: Self-pay | Admitting: Emergency Medicine

## 2017-04-13 ENCOUNTER — Inpatient Hospital Stay (HOSPITAL_COMMUNITY)
Admission: EM | Admit: 2017-04-13 | Discharge: 2017-04-16 | DRG: 690 | Disposition: A | Discharge status: Home / Self Care | Payer: PPO | Attending: Internal Medicine | Admitting: Internal Medicine

## 2017-04-13 DIAGNOSIS — M949 Disorder of cartilage, unspecified: Secondary | ICD-10-CM

## 2017-04-13 DIAGNOSIS — Z905 Acquired absence of kidney: Secondary | ICD-10-CM

## 2017-04-13 DIAGNOSIS — R778 Other specified abnormalities of plasma proteins: Secondary | ICD-10-CM

## 2017-04-13 DIAGNOSIS — D649 Anemia, unspecified: Secondary | ICD-10-CM | POA: Diagnosis present

## 2017-04-13 DIAGNOSIS — N12 Tubulo-interstitial nephritis, not specified as acute or chronic: Secondary | ICD-10-CM | POA: Diagnosis present

## 2017-04-13 DIAGNOSIS — K2970 Gastritis, unspecified, without bleeding: Secondary | ICD-10-CM | POA: Diagnosis not present

## 2017-04-13 DIAGNOSIS — Q613 Polycystic kidney, unspecified: Secondary | ICD-10-CM | POA: Diagnosis not present

## 2017-04-13 DIAGNOSIS — Z885 Allergy status to narcotic agent status: Secondary | ICD-10-CM

## 2017-04-13 DIAGNOSIS — R0789 Other chest pain: Secondary | ICD-10-CM | POA: Diagnosis not present

## 2017-04-13 DIAGNOSIS — K7689 Other specified diseases of liver: Secondary | ICD-10-CM | POA: Diagnosis not present

## 2017-04-13 DIAGNOSIS — G8929 Other chronic pain: Secondary | ICD-10-CM | POA: Diagnosis present

## 2017-04-13 DIAGNOSIS — Z79899 Other long term (current) drug therapy: Secondary | ICD-10-CM

## 2017-04-13 DIAGNOSIS — E039 Hypothyroidism, unspecified: Secondary | ICD-10-CM | POA: Diagnosis not present

## 2017-04-13 DIAGNOSIS — F411 Generalized anxiety disorder: Secondary | ICD-10-CM | POA: Diagnosis present

## 2017-04-13 DIAGNOSIS — Z79891 Long term (current) use of opiate analgesic: Secondary | ICD-10-CM

## 2017-04-13 DIAGNOSIS — N182 Chronic kidney disease, stage 2 (mild): Secondary | ICD-10-CM | POA: Diagnosis not present

## 2017-04-13 DIAGNOSIS — Z780 Asymptomatic menopausal state: Secondary | ICD-10-CM

## 2017-04-13 DIAGNOSIS — R0902 Hypoxemia: Secondary | ICD-10-CM | POA: Diagnosis present

## 2017-04-13 DIAGNOSIS — F419 Anxiety disorder, unspecified: Secondary | ICD-10-CM | POA: Diagnosis present

## 2017-04-13 DIAGNOSIS — K219 Gastro-esophageal reflux disease without esophagitis: Secondary | ICD-10-CM | POA: Diagnosis not present

## 2017-04-13 DIAGNOSIS — N19 Unspecified kidney failure: Secondary | ICD-10-CM | POA: Diagnosis present

## 2017-04-13 DIAGNOSIS — E876 Hypokalemia: Secondary | ICD-10-CM

## 2017-04-13 DIAGNOSIS — B9620 Unspecified Escherichia coli [E. coli] as the cause of diseases classified elsewhere: Secondary | ICD-10-CM | POA: Diagnosis present

## 2017-04-13 DIAGNOSIS — N10 Acute tubulo-interstitial nephritis: Secondary | ICD-10-CM

## 2017-04-13 DIAGNOSIS — I10 Essential (primary) hypertension: Secondary | ICD-10-CM | POA: Diagnosis present

## 2017-04-13 DIAGNOSIS — I248 Other forms of acute ischemic heart disease: Secondary | ICD-10-CM | POA: Diagnosis not present

## 2017-04-13 DIAGNOSIS — R112 Nausea with vomiting, unspecified: Secondary | ICD-10-CM | POA: Diagnosis not present

## 2017-04-13 DIAGNOSIS — I129 Hypertensive chronic kidney disease with stage 1 through stage 4 chronic kidney disease, or unspecified chronic kidney disease: Secondary | ICD-10-CM | POA: Diagnosis not present

## 2017-04-13 DIAGNOSIS — E785 Hyperlipidemia, unspecified: Secondary | ICD-10-CM | POA: Diagnosis present

## 2017-04-13 DIAGNOSIS — G629 Polyneuropathy, unspecified: Secondary | ICD-10-CM | POA: Diagnosis not present

## 2017-04-13 DIAGNOSIS — M899 Disorder of bone, unspecified: Secondary | ICD-10-CM | POA: Diagnosis present

## 2017-04-13 DIAGNOSIS — Z888 Allergy status to other drugs, medicaments and biological substances status: Secondary | ICD-10-CM

## 2017-04-13 DIAGNOSIS — R7989 Other specified abnormal findings of blood chemistry: Secondary | ICD-10-CM

## 2017-04-13 DIAGNOSIS — I4581 Long QT syndrome: Secondary | ICD-10-CM | POA: Diagnosis not present

## 2017-04-13 DIAGNOSIS — R1110 Vomiting, unspecified: Secondary | ICD-10-CM | POA: Diagnosis not present

## 2017-04-13 DIAGNOSIS — G3184 Mild cognitive impairment, so stated: Secondary | ICD-10-CM | POA: Diagnosis present

## 2017-04-13 DIAGNOSIS — R413 Other amnesia: Secondary | ICD-10-CM | POA: Diagnosis present

## 2017-04-13 DIAGNOSIS — M545 Low back pain, unspecified: Secondary | ICD-10-CM | POA: Diagnosis present

## 2017-04-13 DIAGNOSIS — F0390 Unspecified dementia without behavioral disturbance: Secondary | ICD-10-CM | POA: Diagnosis not present

## 2017-04-13 DIAGNOSIS — B961 Klebsiella pneumoniae [K. pneumoniae] as the cause of diseases classified elsewhere: Secondary | ICD-10-CM | POA: Diagnosis not present

## 2017-04-13 DIAGNOSIS — G4700 Insomnia, unspecified: Secondary | ICD-10-CM | POA: Diagnosis not present

## 2017-04-13 DIAGNOSIS — R9431 Abnormal electrocardiogram [ECG] [EKG]: Secondary | ICD-10-CM | POA: Diagnosis not present

## 2017-04-13 DIAGNOSIS — G300 Alzheimer's disease with early onset: Secondary | ICD-10-CM | POA: Diagnosis present

## 2017-04-13 DIAGNOSIS — Z882 Allergy status to sulfonamides status: Secondary | ICD-10-CM

## 2017-04-13 HISTORY — DX: Acute pyelonephritis: N10

## 2017-04-13 LAB — COMPREHENSIVE METABOLIC PANEL
ALT: 11 U/L — AB (ref 14–54)
ANION GAP: 10 (ref 5–15)
AST: 17 U/L (ref 15–41)
Albumin: 3.6 g/dL (ref 3.5–5.0)
Alkaline Phosphatase: 49 U/L (ref 38–126)
BUN: 13 mg/dL (ref 6–20)
CHLORIDE: 103 mmol/L (ref 101–111)
CO2: 26 mmol/L (ref 22–32)
Calcium: 8.8 mg/dL — ABNORMAL LOW (ref 8.9–10.3)
Creatinine, Ser: 1.01 mg/dL — ABNORMAL HIGH (ref 0.44–1.00)
GFR, EST NON AFRICAN AMERICAN: 52 mL/min — AB (ref >60)
Glucose, Bld: 138 mg/dL — ABNORMAL HIGH (ref 65–99)
POTASSIUM: 2.5 mmol/L — AB (ref 3.5–5.1)
SODIUM: 139 mmol/L (ref 135–145)
Total Bilirubin: 1.3 mg/dL — ABNORMAL HIGH (ref 0.3–1.2)
Total Protein: 6.5 g/dL (ref 6.5–8.1)

## 2017-04-13 LAB — URINALYSIS, ROUTINE W REFLEX MICROSCOPIC
Bilirubin Urine: NEGATIVE
Glucose, UA: NEGATIVE mg/dL
Ketones, ur: NEGATIVE mg/dL
Nitrite: POSITIVE — AB
Protein, ur: 30 mg/dL — AB
Specific Gravity, Urine: 1.009 (ref 1.005–1.030)
pH: 6 (ref 5.0–8.0)

## 2017-04-13 LAB — CBC
HEMATOCRIT: 37.8 % (ref 36.0–46.0)
HEMOGLOBIN: 12.4 g/dL (ref 12.0–15.0)
MCH: 29.5 pg (ref 26.0–34.0)
MCHC: 32.8 g/dL (ref 30.0–36.0)
MCV: 90 fL (ref 78.0–100.0)
Platelets: 171 10*3/uL (ref 150–400)
RBC: 4.2 MIL/uL (ref 3.87–5.11)
RDW: 13.9 % (ref 11.5–15.5)
WBC: 10.3 10*3/uL (ref 4.0–10.5)

## 2017-04-13 LAB — I-STAT CG4 LACTIC ACID, ED
LACTIC ACID, VENOUS: 0.62 mmol/L (ref 0.5–1.9)
Lactic Acid, Venous: 0.98 mmol/L (ref 0.5–1.9)

## 2017-04-13 LAB — LIPASE, BLOOD: Lipase: 23 U/L (ref 11–51)

## 2017-04-13 LAB — TROPONIN I
TROPONIN I: 0.05 ng/mL — AB (ref <0.03)
TROPONIN I: 0.05 ng/mL — AB (ref <0.03)
Troponin I: 0.05 ng/mL (ref <0.03)
Troponin I: 0.05 ng/mL (ref <0.03)
Troponin I: 0.04 ng/mL (ref <0.03)

## 2017-04-13 LAB — POC OCCULT BLOOD, ED: FECAL OCCULT BLD: NEGATIVE

## 2017-04-13 LAB — POTASSIUM: Potassium: 3.1 mmol/L — ABNORMAL LOW (ref 3.5–5.1)

## 2017-04-13 LAB — MAGNESIUM: Magnesium: 1.7 mg/dL (ref 1.7–2.4)

## 2017-04-13 MED ORDER — CEFTRIAXONE SODIUM 1 G IJ SOLR: 1.0000 g | Freq: Once | INTRAMUSCULAR | Status: DC

## 2017-04-13 MED ORDER — ACETAMINOPHEN 325 MG PO TABS
650.0000 mg | ORAL_TABLET | Freq: Four times a day (QID) | ORAL | Status: DC | PRN
  Administered 2017-04-13 – 2017-04-15 (×3): 650 mg via ORAL
  Filled 2017-04-13 (×3): qty 2

## 2017-04-13 MED ORDER — ACETAMINOPHEN 325 MG PO TABS
650.0000 mg | ORAL_TABLET | Freq: Once | ORAL | Status: AC
  Administered 2017-04-13: 650 mg via ORAL
  Filled 2017-04-13: qty 2

## 2017-04-13 MED ORDER — BISACODYL 10 MG RE SUPP: 10.0000 mg | Freq: Every day | RECTAL | Status: DC | PRN

## 2017-04-13 MED ORDER — ROSUVASTATIN CALCIUM 20 MG PO TABS
20.0000 mg | ORAL_TABLET | Freq: Every day | ORAL | Status: DC
Start: 2017-04-14 — End: 2017-04-16
  Administered 2017-04-13 – 2017-04-16 (×4): 20 mg via ORAL
  Filled 2017-04-13 (×4): qty 1

## 2017-04-13 MED ORDER — SODIUM CHLORIDE 0.9 % IV SOLN
INTRAVENOUS | Status: DC
  Administered 2017-04-13 – 2017-04-14 (×3): via INTRAVENOUS

## 2017-04-13 MED ORDER — ONDANSETRON HCL 4 MG PO TABS: 4.0000 mg | ORAL_TABLET | Freq: Four times a day (QID) | ORAL | Status: DC | PRN

## 2017-04-13 MED ORDER — IOPAMIDOL (ISOVUE-300) INJECTION 61%
INTRAVENOUS | Status: AC
  Administered 2017-04-13: 80 mL via INTRAVENOUS
  Filled 2017-04-13: qty 100

## 2017-04-13 MED ORDER — ESTROGENS CONJUGATED 0.625 MG PO TABS
0.6250 mg | ORAL_TABLET | Freq: Every day | ORAL | Status: DC
  Administered 2017-04-14 – 2017-04-16 (×3): 0.625 mg via ORAL
  Filled 2017-04-13 (×3): qty 1

## 2017-04-13 MED ORDER — SENNOSIDES-DOCUSATE SODIUM 8.6-50 MG PO TABS: 1.0000 | ORAL_TABLET | Freq: Every evening | ORAL | Status: DC | PRN

## 2017-04-13 MED ORDER — PROMETHAZINE HCL 25 MG/ML IJ SOLN
12.5000 mg | Freq: Four times a day (QID) | INTRAMUSCULAR | Status: DC | PRN
  Administered 2017-04-13: 12.5 mg via INTRAVENOUS

## 2017-04-13 MED ORDER — AMLODIPINE BESYLATE 5 MG PO TABS
5.0000 mg | ORAL_TABLET | Freq: Every day | ORAL | Status: DC
  Administered 2017-04-13 – 2017-04-16 (×4): 5 mg via ORAL
  Filled 2017-04-13 (×4): qty 1

## 2017-04-13 MED ORDER — HEPARIN SODIUM (PORCINE) 5000 UNIT/ML IJ SOLN
5000.0000 [IU] | Freq: Three times a day (TID) | INTRAMUSCULAR | Status: DC
  Administered 2017-04-13 – 2017-04-16 (×8): 5000 [IU] via SUBCUTANEOUS
  Filled 2017-04-13 (×7): qty 1

## 2017-04-13 MED ORDER — POTASSIUM CHLORIDE CRYS ER 20 MEQ PO TBCR
40.0000 meq | EXTENDED_RELEASE_TABLET | Freq: Once | ORAL | Status: AC
  Administered 2017-04-13: 40 meq via ORAL
  Filled 2017-04-13: qty 2

## 2017-04-13 MED ORDER — ONDANSETRON HCL 4 MG/2ML IJ SOLN: 4.0000 mg | Freq: Four times a day (QID) | INTRAMUSCULAR | Status: DC | PRN

## 2017-04-13 MED ORDER — PANTOPRAZOLE SODIUM 40 MG PO TBEC
40.0000 mg | DELAYED_RELEASE_TABLET | Freq: Every day | ORAL | Status: DC
  Administered 2017-04-13 – 2017-04-16 (×4): 40 mg via ORAL
  Filled 2017-04-13 (×4): qty 1

## 2017-04-13 MED ORDER — ACETAMINOPHEN 650 MG RE SUPP: 650.0000 mg | Freq: Four times a day (QID) | RECTAL | Status: DC | PRN

## 2017-04-13 MED ORDER — SERTRALINE HCL 100 MG PO TABS
100.0000 mg | ORAL_TABLET | Freq: Two times a day (BID) | ORAL | Status: DC
  Administered 2017-04-13 – 2017-04-16 (×6): 100 mg via ORAL
  Filled 2017-04-13 (×6): qty 1

## 2017-04-13 MED ORDER — ALPRAZOLAM 0.5 MG PO TABS
0.5000 mg | ORAL_TABLET | Freq: Every evening | ORAL | Status: DC | PRN
  Administered 2017-04-13 – 2017-04-15 (×3): 0.5 mg via ORAL
  Filled 2017-04-13 (×3): qty 1

## 2017-04-13 MED ORDER — DEXTROSE 5 % IV SOLN
1.0000 g | INTRAVENOUS | Status: DC
  Administered 2017-04-13 – 2017-04-16 (×4): 1 g via INTRAVENOUS
  Filled 2017-04-13 (×4): qty 10

## 2017-04-13 MED ORDER — LABETALOL HCL 300 MG PO TABS
300.0000 mg | ORAL_TABLET | Freq: Two times a day (BID) | ORAL | Status: DC
  Administered 2017-04-13 – 2017-04-16 (×6): 300 mg via ORAL
  Filled 2017-04-13 (×6): qty 1

## 2017-04-13 MED ORDER — SODIUM CHLORIDE 0.9 % IV BOLUS (SEPSIS)
1000.0000 mL | Freq: Once | INTRAVENOUS | Status: AC
  Administered 2017-04-13: 1000 mL via INTRAVENOUS

## 2017-04-13 NOTE — ED Notes (Signed)
Patient given water to drink at this time.

## 2017-04-13 NOTE — ED Triage Notes (Signed)
PT reports upper abdominal pain and pressure all night. PT reports a hiatal hernia and has had this pain before. PT reports when she got up this morning she began to vomit every time she sat up. Position changes made nausea significantly worse. No dizziness.

## 2017-04-13 NOTE — ED Notes (Signed)
Care handoff to [facility]

## 2017-04-13 NOTE — ED Notes (Signed)
Claiborne Billings PA made aware of Trop and K+ lab values

## 2017-04-13 NOTE — ED Provider Notes (Signed)
[facility] DEPT Provider Note   CSN: [id number] Arrival date & time: 04/13/17  0932     History   Chief Complaint Chief Complaint  Patient presents with  . Abdominal Pain  . Emesis    HPI Alyssa Fernandez is a 76 y.o. female who presents with epigastric abdominal pain, N/V. PMH significant for polycystic kidney disease, hx of ARF, HTN, HLD, hx of liver abscess. She states she started to have some epigastric abdominal discomfort last night. She had trouble sleeping because she couldn't get comfortable. She was fixing her husband breakfast and ate a little bit. She went to lie down and that several episodes of non-bloody vomiting which was yellowish-clear in color. She also had a fever. EMS was called who transported her here. She also has concerns for GIB due to black stools. She states this has resolved after she stopped drinking red wine. She denies chest pain, SOB, diarrhea, dysuria. She denies sick contacts. Of note she had a UTI a month ago and was diagnosed with a cystocele. She was prescribed Keflex.  HPI  Past Medical History:  Diagnosis Date  . Anxiety   . 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 GERD   . Hyperlipidemia   . Hypertension   . HYPOTHYROIDISM   . INSOMNIA   . 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 OSTEOPENIA   . Polycystic kidney disease   . RENAL FAILURE   . VITAMIN D DEFICIENCY     Patient Active Problem List   Diagnosis Date Noted  . Chronic low back pain 05/04/2016  . Easy bruising 12/02/2015  . Memory loss 09/17/2015  . Encounter for immunization 06/03/2015  . Fatigue 11/25/2014  . Degenerative joint disease 05/26/2014  . Hives 04/08/2014  . Dyspnea 02/03/2014  . Peripheral neuropathy ([place]) 02/05/2013  . Bilateral foot pain 11/20/2012  . Vaginal itching 06/26/2012  . Low blood pressure 06/19/2012  . Liver abscess 06/19/2012  . Normocytic anemia 05/07/2012  . Hypokalemia 05/06/2012  . Fever 05/06/2012  .  Dehydration 05/06/2012  . Hepatic abscess 05/03/2012  . Rash 11/20/2011  . Encounter for well adult exam with abnormal findings 11/16/2011  . Anxiety 11/16/2011  . 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 Essential hypertension 02/24/2010  . DERMATITIS, ATOPIC 01/20/2010  . Edema 12/28/2009  . VITAMIN D DEFICIENCY 11/10/2009  . UNS ADVRS EFF OTH RX MEDICINAL&BIOLOGICAL SBSTNC 11/10/2009  . TACHYCARDIA 10/05/2009  . OTHER POSTSURGICAL STATUS OTHER 07/21/2009  . POSTMENOPAUSAL SYNDROME 03/09/2009  . 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 INSOMNIA 06/17/2008  . Hypothyroidism 02/12/2008  . RENAL FAILURE 02/12/2008  . POLYCYSTIC KIDNEY DISEASE 02/12/2008  . GERD 06/12/2007  . OSTEOPENIA 06/12/2007  . Hyperlipidemia 03/25/2007    Past Surgical History:  Procedure Laterality Date  . ABDOMINAL HYSTERECTOMY    . 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 bladder operation, but per sling procedure    . BUNIONECTOMY    . CERVICAL DISC SURGERY    . fx rt ankle    . left temporal bx artery-negative 2     . PARTIAL NEPHRECTOMY    . percutaneous drainage of liver abscess  05/06/2012    OB History    No data available       Home Medications    Prior to Admission medications   Medication Sig Start Date End Date Taking? Authorizing Provider  ALPRAZolam Duanne Moron) 0.5 MG tablet TAKE ONE TABLET AT  BEDTIME AS NEEDED Dianne Dun 10/12/16   Biagio Borg, MD  amLODipine (NORVASC) 5 MG tablet TAKE ONE TABLET EACH DAY 12/05/16   Biagio Borg, MD  amphetamine-dextroamphetamine (ADDERALL) 10 MG tablet Take 1 tablet (10 mg total) by mouth 2 (two) times daily. 04/04/17   Biagio Borg, MD  cephALEXin (KEFLEX) 500 MG capsule Take 1 capsule (500 mg total) by mouth 4 (four) times daily. 03/08/17   Janne Napoleon, NP  donepezil (ARICEPT) 5 MG tablet Take 1 tablet (5 mg total) by mouth daily. 09/17/15   Biagio Borg, MD  HYDROcodone-acetaminophen (NORCO/VICODIN) 5-325 MG tablet TAKE ONE TABLET  TWICE DAILY AS NEEDED FOR MODERATE PAIN 12/05/16   Biagio Borg, MD  labetalol (NORMODYNE) 300 MG tablet TAKE ONE TABLET TWICE DAILY 09/14/16   Biagio Borg, MD  pantoprazole (PROTONIX) 40 MG tablet TAKE ONE TABLET BY MOUTH ONCE DAILY 12/05/16   Biagio Borg, MD  PREMARIN 0.625 MG tablet TAKE ONE TABLET EACH DAY FOR 21 DAYS THEN DO NOT TAKE FOR 7 DAYS 12/05/16   Biagio Borg, MD  rosuvastatin (CRESTOR) 20 MG tablet TAKE ONE TABLET EVERY DAY 10/12/16   Biagio Borg, MD  sertraline (ZOLOFT) 100 MG tablet TAKE ONE TABLET TWICE DAILY 09/14/16   Biagio Borg, MD  telmisartan (MICARDIS) 40 MG tablet TAKE ONE TABLET EACH DAY 06/05/16   Biagio Borg, MD    Family History Family History  Problem Relation Age of Onset  . Stroke Mother     Social History Social History  Substance Use Topics  . Smoking status: Never Smoker  . Smokeless tobacco: Never Used  . Alcohol use No     Allergies   Codeine; Sulfonamide derivatives; and Zocor [simvastatin]   Review of Systems Review of Systems  Constitutional: Positive for fever.  Respiratory: Negative for shortness of breath.   Cardiovascular: Negative for chest pain.  Gastrointestinal: Positive for abdominal pain, nausea and vomiting. Negative for blood in stool and diarrhea.  Genitourinary: Negative for dysuria, flank pain, frequency and pelvic pain.  All other systems reviewed and are negative.    Physical Exam Updated Vital Signs BP 137/79   Pulse 92   Temp (!) 100.8 F (38.2 C) (Oral)   Resp 14   Ht 5\' 3"  (1.6 m)   Wt 61.2 kg (135 lb)   SpO2 93%   BMI 23.91 kg/m   Physical Exam  Constitutional: She is oriented to person, place, and time. She appears well-developed and well-nourished. No distress.  HENT:  Head: Normocephalic and atraumatic.  Eyes: Pupils are equal, round, and reactive to light. Conjunctivae are normal. Right eye exhibits no discharge. Left eye exhibits no discharge. No scleral icterus.  Neck: Normal range of  motion.  Cardiovascular: Normal rate and regular rhythm.  Exam reveals no gallop and no friction rub.   No murmur heard. Pulmonary/Chest: Effort normal and breath sounds normal. No respiratory distress. She has no wheezes. She has no rales. She exhibits no tenderness.  Abdominal: Soft. Bowel sounds are normal. She exhibits no distension and no mass. There is tenderness (minimally tender in epigastric area). There is no rebound and no guarding. No hernia.  No CVA tenderness  Neurological: She is alert and oriented to person, place, and time.  Skin: Skin is warm. She is diaphoretic (from fever).  Psychiatric: She has a normal mood and affect. Her behavior is normal.  Nursing note and vitals reviewed.    ED Treatments /  Results  Labs (all labs ordered are listed, but only abnormal results are displayed) Labs Reviewed  COMPREHENSIVE METABOLIC PANEL - Abnormal; Notable for the following:       Result Value   Potassium 2.5 (*)    Glucose, Bld 138 (*)    Creatinine, Ser 1.01 (*)    Calcium 8.8 (*)    ALT 11 (*)    Total Bilirubin 1.3 (*)    GFR calc non Af Amer 52 (*)    All other components within normal limits  URINALYSIS, ROUTINE W REFLEX MICROSCOPIC - Abnormal; Notable for the following:    Color, Urine AMBER (*)    APPearance HAZY (*)    Hgb urine dipstick MODERATE (*)    Protein, ur 30 (*)    Nitrite POSITIVE (*)    Leukocytes, UA SMALL (*)    Bacteria, UA MANY (*)    Squamous Epithelial / LPF 0-5 (*)    All other components within normal limits  TROPONIN I - Abnormal; Notable for the following:    Troponin I 0.05 (*)    All other components within normal limits  TROPONIN I - Abnormal; Notable for the following:    Troponin I 0.05 (*)    All other components within normal limits  URINE CULTURE  CULTURE, BLOOD (ROUTINE X 2)  CULTURE, BLOOD (ROUTINE X 2)  LIPASE, BLOOD  CBC  TROPONIN I  TROPONIN I  TROPONIN I  POC OCCULT BLOOD, ED  I-STAT CG4 LACTIC ACID, ED     EKG  EKG Interpretation  Date/Time:  Friday April 13 2017 09:49:56 EDT Ventricular Rate:  90 PR Interval:    QRS Duration: 98 QT Interval:  379 QTC Calculation: 464 R Axis:   50 Text Interpretation:  Sinus rhythm Some ST depressions laterally and inferiorly. No STEMI.  Confirmed by Nanda Quinton [phone]) on 04/13/2017 10:10:42 AM       Radiology Ct Abdomen Pelvis W Contrast  Result Date: 04/13/2017 CLINICAL DATA:  Epigastric discomfort with nausea and vomiting since this morning. EXAM: CT ABDOMEN AND PELVIS WITH CONTRAST TECHNIQUE: Multidetector CT imaging of the abdomen and pelvis was performed using the standard protocol following bolus administration of intravenous contrast. CONTRAST:  80 cc ISOVUE-300 IOPAMIDOL (ISOVUE-300) INJECTION 61% COMPARISON:  CT abdomen and pelvis dated May 31, 2012. FINDINGS: Lower chest: No acute abnormality. Hepatobiliary: Hepatomegaly with innumerable low-density cysts throughout the liver, some of which have increased in size when compared to prior study. Status post cholecystectomy with unchanged prominent intra and extrahepatic bile ducts, likely reflecting post cholecystectomy state. Pancreas: Unremarkable. No pancreatic ductal dilatation or surrounding inflammatory changes. Spleen: Normal in size without focal abnormality. Adrenals/Urinary Tract: The adrenal glands are unremarkable. Innumerable bilateral renal cysts are again seen, some of which have increased in size when compared to prior study. No hydronephrosis. The bladder is unremarkable. Stomach/Bowel: Moderate hiatal hernia. The appendix is not definitively seen, however there are no secondary signs of inflammation in the right lower quadrant. No bowel wall thickening, distention, or surrounding inflammatory change. Vascular/Lymphatic: Aortic atherosclerosis. No enlarged abdominal or pelvic lymph nodes. Reproductive: Status post hysterectomy. No adnexal masses. Other: No abdominal wall hernia or  abnormality. No abdominopelvic ascites. Musculoskeletal: No acute or significant osseous findings. Severe degenerative disc disease at L2-L3. IMPRESSION: 1. No acute intra-abdominal process. 2. Polycystic kidney and liver disease, with slight interval increase in size of innumerable cysts in the liver and kidneys. 3.  Aortic atherosclerosis (ICD10-I70.0). Electronically Signed   By: Gwyndolyn Saxon  Marzella Schlein M.D.   On: 04/13/2017 13:16   Dg Chest Port 1 View  Result Date: 04/13/2017 CLINICAL DATA:  Hypoxia. EXAM: PORTABLE CHEST 1 VIEW COMPARISON:  05/06/2012 FINDINGS: The heart size and mediastinal contours are within normal limits. Both lungs are clear. The visualized skeletal structures are unremarkable. IMPRESSION: No active disease. Electronically Signed   By: Kerby Moors M.D.   On: 04/13/2017 13:47    Procedures Procedures (including critical care time)  Medications Ordered in ED Medications  cefTRIAXone (ROCEPHIN) 1 g in dextrose 5 % 50 mL IVPB (0 g Intravenous Stopped 04/13/17 1230)  amLODipine (NORVASC) tablet 5 mg (not administered)  pantoprazole (PROTONIX) EC tablet 40 mg (not administered)  estrogens (conjugated) (PREMARIN) tablet 0.625 mg (not administered)  ALPRAZolam (XANAX) tablet 0.5 mg (not administered)  rosuvastatin (CRESTOR) tablet 20 mg (not administered)  labetalol (NORMODYNE) tablet 300 mg (not administered)  sertraline (ZOLOFT) tablet 100 mg (not administered)  0.9 %  sodium chloride infusion (not administered)  acetaminophen (TYLENOL) tablet 650 mg (not administered)    Or  acetaminophen (TYLENOL) suppository 650 mg (not administered)  senna-docusate (Senokot-S) tablet 1 tablet (not administered)  bisacodyl (DULCOLAX) suppository 10 mg (not administered)  ondansetron (ZOFRAN) tablet 4 mg (not administered)    Or  ondansetron (ZOFRAN) injection 4 mg (not administered)  heparin injection 5,000 Units (not administered)  acetaminophen (TYLENOL) tablet 650 mg (650 mg Oral  Given 04/13/17 1103)  sodium chloride 0.9 % bolus 1,000 mL (0 mLs Intravenous Stopped 04/13/17 1339)  potassium chloride SA (K-DUR,KLOR-CON) CR tablet 40 mEq (40 mEq Oral Given 04/13/17 1150)  iopamidol (ISOVUE-300) 61 % injection (80 mLs Intravenous Contrast Given 04/13/17 1258)     Initial Impression / Assessment and Plan / ED Course  I have reviewed the triage vital signs and the nursing notes.  Pertinent labs & imaging results that were available during my care of the patient were reviewed by me and considered in my medical decision making (see chart for details).  76 year old female with pyelonephritis and elevated troponin likely due to demand ischemia.   She is febrile on arrival and had a couple episodes of hypoxia and was placed on 1L of O2 via [place]. All other vitals are normal. On exam she is diaphoretic from fever. Abdomen is soft and minimally tender. Will order Tylenol, labs, fluids, CT of abdomen. She also had an episode of chest pain. EKG shows ST depressions. CXR and Troponin ordered  CBC is normal. CMP remarkable for hypokalemia of 2.5 - K was replaced. Lipase is normal. Lactic acid is normal. UA shows obvious UTI with moderate hgb, 30 protein, nitrite positive, small leukocytes, TNTC RBC and WBC. Culture was sent. Rocephin started.   CT of abdomen is unremarkable. CXR is negative. Troponin is elevated at 0.05. Shared visit with Dr. Laverta Baltimore. Will admit for further evaluation and observation. Spoke with Libby Maw NP who will admit.  Final Clinical Impressions(s) / ED Diagnoses   Final diagnoses:  Pyelonephritis  Hypokalemia  Elevated troponin    New Prescriptions New Prescriptions   No medications on file     Iris Pert 04/13/17 1457    Long, Wonda [facility], MD 04/13/17 [phone]

## 2017-04-13 NOTE — H&P (Signed)
History and Physical    Alyssa Fernandez:[id number] DOB: 04/27/1941 DOA: 04/13/2017   PCP: Biagio Borg, MD   Patient coming from:  Home    Chief Complaint: abdominal pain nausea and vomiting  HPI: Alyssa LANGLAIS is a 76 y.o. female with medical history significant for polycystic kidney disease, history of renal insufficiency, hypertension, hyperlipidemia, history of liver abscess in the past, presenting today with epigastric abdominal discomfort since last night. The patient has been trying to lie down, but unable to rest, due to several episodes of nonbloody vomiting, with yellowish clear emesis. He also reported subjective fever. She denies any chest pain shortness of breath diarrhea dysuria or hematuria. Of note, the patient had a recent UTI, one month ago, treated with antibiotics. At the time, according to patient,she  had been diagnosed with cystocele, and rectocele and  was referred to a urologist . [facility] reports that the Urologist told her thathat she is not a candidate for surgery, but unable to find any data on echart.  She denies any issues with urine retention, or incontinence. She denies any lower extremity swelling.    ED Course:  BP 115/70   Pulse 73   Temp (!) 101.5 F (38.6 C) (Rectal)   Resp 19   Ht 5\' 3"  (1.6 m)   Wt 61.2 kg (135 lb)   SpO2 99%   BMI 23.91 kg/m  on arrival, she had a couple of episodes of hypoxia, placed at 1 L of oxygen, with resolution, and she is now on room air. urine shows moderate hemoglobin, small leukocytes, positive nitrite, many bacteria  troponin 0.05. EKG with sinus rhythm, possible ST depression with prolonged QT chest x-ray negative lactic acid 0.60 Hemoccult negative potassium was 2.5, and was replaced at the ED with 40 mEq PO ROcephin  IV was initiated  Review of Systems:  As per HPI otherwise all other systems reviewed and are negative  Past Medical History:  Diagnosis Date  . Anxiety   . 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 GERD   . Hyperlipidemia   . Hypertension   . HYPOTHYROIDISM   . INSOMNIA   . 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 OSTEOPENIA   . Polycystic kidney disease   . RENAL FAILURE   . VITAMIN D DEFICIENCY     Past Surgical History:  Procedure Laterality Date  . ABDOMINAL HYSTERECTOMY    . 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 bladder operation, but per sling procedure    . BUNIONECTOMY    . CERVICAL DISC SURGERY    . fx rt ankle    . left temporal bx artery-negative 2     . PARTIAL NEPHRECTOMY    . percutaneous drainage of liver abscess  05/06/2012    Social History Social History   Social History  . Marital status: Married    Spouse name: N/A  . Number of children: N/A  . Years of education: N/A   Occupational History  . Not on file.   Social History Main Topics  . Smoking status: Never Smoker  . Smokeless tobacco: Never Used  . Alcohol use No  . Drug use: No  . Sexual activity: Not Currently    Birth control/ protection: Post-menopausal   Other Topics Concern  . Not on file   Social History Narrative  . No narrative on file     Allergies  Allergen Reactions  .  Codeine Swelling  . Sulfonamide Derivatives Swelling  . Zocor [Simvastatin] Other (See Comments)    Cognitive decrease    Family History  Problem Relation Age of Onset  . Stroke Mother       Prior to Admission medications   Medication Sig Start Date End Date Taking? Authorizing Provider  ALPRAZolam Duanne Moron) 0.5 MG tablet TAKE ONE TABLET AT BEDTIME AS NEEDED Dianne Dun 10/12/16   Biagio Borg, MD  amLODipine (NORVASC) 5 MG tablet TAKE ONE TABLET EACH DAY 12/05/16   Biagio Borg, MD  amphetamine-dextroamphetamine (ADDERALL) 10 MG tablet Take 1 tablet (10 mg total) by mouth 2 (two) times daily. 04/04/17   Biagio Borg, MD  cephALEXin (KEFLEX) 500 MG capsule Take 1 capsule (500 mg total) by mouth 4 (four) times daily. 03/08/17   Janne Napoleon, NP  donepezil (ARICEPT)  5 MG tablet Take 1 tablet (5 mg total) by mouth daily. 09/17/15   Biagio Borg, MD  HYDROcodone-acetaminophen (NORCO/VICODIN) 5-325 MG tablet TAKE ONE TABLET TWICE DAILY AS NEEDED FOR MODERATE PAIN 12/05/16   Biagio Borg, MD  labetalol (NORMODYNE) 300 MG tablet TAKE ONE TABLET TWICE DAILY 09/14/16   Biagio Borg, MD  pantoprazole (PROTONIX) 40 MG tablet TAKE ONE TABLET BY MOUTH ONCE DAILY 12/05/16   Biagio Borg, MD  PREMARIN 0.625 MG tablet TAKE ONE TABLET EACH DAY FOR 21 DAYS THEN DO NOT TAKE FOR 7 DAYS 12/05/16   Biagio Borg, MD  rosuvastatin (CRESTOR) 20 MG tablet TAKE ONE TABLET EVERY DAY 10/12/16   Biagio Borg, MD  sertraline (ZOLOFT) 100 MG tablet TAKE ONE TABLET TWICE DAILY 09/14/16   Biagio Borg, MD  telmisartan (MICARDIS) 40 MG tablet TAKE ONE TABLET EACH DAY 06/05/16   Biagio Borg, MD    Physical Exam:  Vitals:   04/13/17 1100 04/13/17 1145 04/13/17 1230 04/13/17 1330  BP: 117/70 121/64 121/69 115/70  Pulse: 87 82 78 73  Resp: 16 17 18 19   Temp: (!) 101.5 F (38.6 C)     TempSrc: Rectal     SpO2: 91% 93% 93% 99%  Weight:      Height:       Constitutional: NAD, calm, comfortable  Eyes: PERRL, lids and conjunctivae normal ENMT: Mucous membranes are moist, without exudate or lesions  Neck: normal, supple, no masses, no thyromegaly Respiratory: clear to auscultation bilaterally, no wheezing, no crackles. Normal respiratory effort  Cardiovascular: Regular rate and rhythm, no murmurs, rubs or gallops. No extremity edema. 2+ pedal pulses. No carotid bruits.  Abdomen: Soft,Minimal  Tenderness in the epigastrium, No hepatosplenomegaly. Bowel sounds positive. No CVAT Musculoskeletal: no clubbing / cyanosis. Moves all extremities Skin: no jaundice, No lesions.  Neurologic: Sensation intact  Strength equal in all extremities Psychiatric:   Alert and oriented x 3. Normal mood.     Labs on Admission: I have personally reviewed following labs and imaging  studies  CBC:  Recent Labs Lab 04/13/17 1040  WBC 10.3  HGB 12.4  HCT 37.8  MCV 90.0  PLT 272    Basic Metabolic Panel:  Recent Labs Lab 04/13/17 1040  NA 139  K 2.5*  CL 103  CO2 26  GLUCOSE 138*  BUN 13  CREATININE 1.01*  CALCIUM 8.8*    GFR: Estimated Creatinine Clearance: 38 mL/min (A) (by C-G formula based on SCr of 1.01 mg/dL (H)).  Liver Function Tests:  Recent Labs Lab 04/13/17 1040  AST 17  ALT 11*  ALKPHOS 49  BILITOT 1.3*  PROT 6.5  ALBUMIN 3.6    Recent Labs Lab 04/13/17 1040  LIPASE 23   No results for input(s): AMMONIA in the last 168 hours.  Coagulation Profile: No results for input(s): INR, PROTIME in the last 168 hours.  Cardiac Enzymes:  Recent Labs Lab 04/13/17 1011  TROPONINI 0.05*    BNP (last 3 results) No results for input(s): PROBNP in the last 8760 hours.  HbA1C: No results for input(s): HGBA1C in the last 72 hours.  CBG: No results for input(s): GLUCAP in the last 168 hours.  Lipid Profile: No results for input(s): CHOL, HDL, LDLCALC, TRIG, CHOLHDL, LDLDIRECT in the last 72 hours.  Thyroid Function Tests: No results for input(s): TSH, T4TOTAL, FREET4, T3FREE, THYROIDAB in the last 72 hours.  Anemia Panel: No results for input(s): VITAMINB12, FOLATE, FERRITIN, TIBC, IRON, RETICCTPCT in the last 72 hours.  Urine analysis:    Component Value Date/Time   COLORURINE AMBER (A) 04/13/2017 1040   APPEARANCEUR HAZY (A) 04/13/2017 1040   LABSPEC 1.009 04/13/2017 1040   PHURINE 6.0 04/13/2017 1040   GLUCOSEU NEGATIVE 04/13/2017 1040   GLUCOSEU NEGATIVE 04/03/2017 1013   HGBUR MODERATE (A) 04/13/2017 1040   HGBUR negative 01/31/2010 1644   BILIRUBINUR NEGATIVE 04/13/2017 1040   BILIRUBINUR n 04/12/2011 1413   KETONESUR NEGATIVE 04/13/2017 1040   PROTEINUR 30 (A) 04/13/2017 1040   UROBILINOGEN 0.2 04/03/2017 1013   NITRITE POSITIVE (A) 04/13/2017 1040   LEUKOCYTESUR SMALL (A) 04/13/2017 1040    Sepsis  Labs: @LABRCNTIP (procalcitonin:4,lacticidven:4) )No results found for this or any previous visit (from the past 240 hour(s)).   Radiological Exams on Admission: Ct Abdomen Pelvis W Contrast  Result Date: 04/13/2017 CLINICAL DATA:  Epigastric discomfort with nausea and vomiting since this morning. EXAM: CT ABDOMEN AND PELVIS WITH CONTRAST TECHNIQUE: Multidetector CT imaging of the abdomen and pelvis was performed using the standard protocol following bolus administration of intravenous contrast. CONTRAST:  80 cc ISOVUE-300 IOPAMIDOL (ISOVUE-300) INJECTION 61% COMPARISON:  CT abdomen and pelvis dated May 31, 2012. FINDINGS: Lower chest: No acute abnormality. Hepatobiliary: Hepatomegaly with innumerable low-density cysts throughout the liver, some of which have increased in size when compared to prior study. Status post cholecystectomy with unchanged prominent intra and extrahepatic bile ducts, likely reflecting post cholecystectomy state. Pancreas: Unremarkable. No pancreatic ductal dilatation or surrounding inflammatory changes. Spleen: Normal in size without focal abnormality. Adrenals/Urinary Tract: The adrenal glands are unremarkable. Innumerable bilateral renal cysts are again seen, some of which have increased in size when compared to prior study. No hydronephrosis. The bladder is unremarkable. Stomach/Bowel: Moderate hiatal hernia. The appendix is not definitively seen, however there are no secondary signs of inflammation in the right lower quadrant. No bowel wall thickening, distention, or surrounding inflammatory change. Vascular/Lymphatic: Aortic atherosclerosis. No enlarged abdominal or pelvic lymph nodes. Reproductive: Status post hysterectomy. No adnexal masses. Other: No abdominal wall hernia or abnormality. No abdominopelvic ascites. Musculoskeletal: No acute or significant osseous findings. Severe degenerative disc disease at L2-L3. IMPRESSION: 1. No acute intra-abdominal process. 2.  Polycystic kidney and liver disease, with slight interval increase in size of innumerable cysts in the liver and kidneys. 3.  Aortic atherosclerosis (ICD10-I70.0). Electronically Signed   By: Titus Dubin M.D.   On: 04/13/2017 13:16   Dg Chest Port 1 View  Result Date: 04/13/2017 CLINICAL DATA:  Hypoxia. EXAM: PORTABLE CHEST 1 VIEW COMPARISON:  05/06/2012 FINDINGS: The heart size and mediastinal contours are within normal limits. Both  lungs are clear. The visualized skeletal structures are unremarkable. IMPRESSION: No active disease. Electronically Signed   By: Kerby Moors M.D.   On: 04/13/2017 13:47    EKG: Independently reviewed.  Assessment/Plan Active Problems:   Hypothyroidism   Hyperlipidemia   Essential hypertension   GERD   Renal failure   Disorder of bone and cartilage   Polycystic kidney   INSOMNIA   Anxiety   Normocytic anemia   Peripheral neuropathy (HCC)   Memory loss   Chronic low back pain   Acute pyelonephritis   Acute pyelonephritis in a patient with a history of polycystic kidney disease s/p partial nephrectomy . Had nausea and vomiting prior to admission   UA + nitrites and leukocytes in urine. WBC is 10.3  Lactic normal 0.6  .T max 101.5 Received IV Rocephin  In ED. IVF given . Patient with a h/o cystocele seen  by Urology as OP, not yet a candidate for surgery per her report, but unable to find any data    F/u urine and blood culture Continue Ceftriaxone IV  Follow CBC in am  IVF 100 cc/h  Bladder scan to evaluate for residual, I/O  Will need OP f/u with Urology   Mild elevation of troponin and abnormal EKG. Likely due to demand ischemia secondary to infection ,versus cardiac   Cardiology to see, as requested by EDP Check serial Tn Repeat EKG May need Echo  WIll follow on recommendations  Hypokalemia, may be due to volume loss . EKG  QT prolonged  initial K 2.5  Received  Kdur 40  Oral replenishment prn  Check Mg Repeat CMET in  am   Hypertension BP 115/70   Pulse 73   Controlled Continue home anti-hypertensive medications in am(patient took her morning meds)      Hyperlipidemia Continue home statins  Chronic kidney disease stage 2   baseline creatinine  1  At baseline  Lab Results  Component Value Date   CREATININE 1.01 (H) 04/13/2017   CREATININE 1.00 04/03/2017   CREATININE 1.00 05/05/2016  IVF Repeat CMET in am  GERD, no acute symptoms Continue PPI   Anxiety/ Depression  Continue home Xanax  And Zoloft    Dementia, mild no acute issues Continue Aricept   Menopause Continue Premarin cream    DVT prophylaxis:   Heparin   Code Status:   Full  Family Communication:  Discussed with patient Disposition Plan: Expect patient to be discharged to home after condition improves Consults called:  Cards by EDP    Admission status:Tele  Inpatient     [facility], PA-C [facility]   04/13/2017, 2:22 PM

## 2017-04-13 NOTE — ED Triage Notes (Signed)
PT had 4mg  zofran in route. Nausea is relieved.

## 2017-04-14 DIAGNOSIS — E876 Hypokalemia: Secondary | ICD-10-CM

## 2017-04-14 DIAGNOSIS — E785 Hyperlipidemia, unspecified: Secondary | ICD-10-CM

## 2017-04-14 DIAGNOSIS — Q613 Polycystic kidney, unspecified: Secondary | ICD-10-CM

## 2017-04-14 DIAGNOSIS — I10 Essential (primary) hypertension: Secondary | ICD-10-CM

## 2017-04-14 LAB — BASIC METABOLIC PANEL
Anion gap: 10 (ref 5–15)
BUN: 11 mg/dL (ref 6–20)
CALCIUM: 8 mg/dL — AB (ref 8.9–10.3)
CO2: 24 mmol/L (ref 22–32)
Chloride: 106 mmol/L (ref 101–111)
Creatinine, Ser: 0.9 mg/dL (ref 0.44–1.00)
GFR calc Af Amer: >60 mL/min (ref >60)
GFR, EST NON AFRICAN AMERICAN: 60 mL/min — AB (ref >60)
GLUCOSE: 102 mg/dL — AB (ref 65–99)
Potassium: 2.8 mmol/L — ABNORMAL LOW (ref 3.5–5.1)
Sodium: 140 mmol/L (ref 135–145)

## 2017-04-14 LAB — PROTIME-INR
INR: 1.14
PROTHROMBIN TIME: 14.7 s (ref 11.4–15.2)

## 2017-04-14 MED ORDER — POTASSIUM CHLORIDE CRYS ER 20 MEQ PO TBCR
40.0000 meq | EXTENDED_RELEASE_TABLET | ORAL | Status: AC
  Administered 2017-04-14 (×2): 40 meq via ORAL
  Filled 2017-04-14 (×2): qty 2

## 2017-04-14 MED ORDER — MAGNESIUM SULFATE IN D5W 1-5 GM/100ML-% IV SOLN
1.0000 g | Freq: Once | INTRAVENOUS | Status: AC
  Administered 2017-04-14: 1 g via INTRAVENOUS
  Filled 2017-04-14: qty 100

## 2017-04-14 MED ORDER — SODIUM CHLORIDE 0.9 % IV SOLN: INTRAVENOUS | Status: DC

## 2017-04-14 NOTE — Progress Notes (Signed)
PROGRESS NOTE   Alyssa Fernandez  TKP:[id number]    DOB: 03/09/1941    DOA: 04/13/2017  PCP: Biagio Borg, MD   I have briefly reviewed patients previous medical records in [facility].  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 In ED, febrile to 101.5, no leukocytosis, positive UA, normal lactate. Admitted for presumed acute pyelonephritis. Improving.   Assessment & Plan:   Active Problems:   Hypothyroidism   Hyperlipidemia   Essential hypertension   GERD   Renal failure   Disorder of bone and cartilage   Polycystic kidney   INSOMNIA   Anxiety   Normocytic anemia   Peripheral neuropathy (HCC)   Memory loss   Chronic low back pain   Acute pyelonephritis   Pyelonephritis   1. Possible acute pyelonephritis: Complicating underlying polycystic kidney disease. Urine microscopy on admission showed many bacteria, too numerous to count white blood cells and positive nitrites. Empirically started on IV ceftriaxone. Blood cultures 2: Negative to date. Urine cultures pending. Not sure if her GU anatomy i.e. rectocele/cystocele is contributing to her getting a UTI. CT abdomen and pelvis however does not show the rectocele or cystocele. Continue outpatient urology follow-up. 2. Nausea and vomiting, non-intractable: Likely related to problem #1. Resolved. 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 3. Severe hypokalemia: Secondary to GI losses. Replace aggressively and follow. Magnesium  1.7. 4. Essential hypertension: Mildly uncontrolled at times. Continue amlodipine and labetalol. 5. Hyperlipidemia: Continue statins. 6. Atypical chest pain: Likely GI origin. Very transient and lasted a couple of minutes and has not recurred. Physically quite active and takes care of her husband with dementia. No prior history of chest pain. EKG shows sinus rhythm without acute changes. Minimally elevated troponin with flat trend likely from demand. No further workup at this time. 7. Prolonged QTC: EKG on admission showed QTC of 511 ms. Replace potassium and magnesium and follow EKG in a.m. 8. GERD: Continue PPI. 9. Polycystic kidney and liver disease: As per CT abdomen report, slight interval increase in size of innumerable cysts in the liver and kidneys compared to CT from October 2013.   DVT prophylaxis: Subcutaneous heparin Code Status: Full Family Communication: None at bedside Disposition: DC home when medically improved, possibly in the next 1 or 2 days.   Consultants:  None   Procedures:  None  Antimicrobials:  IV ceftriaxone 04/13/17    Subjective: Feels much better. No further nausea and vomiting, resolved sometime yesterday afternoon. No recurrence of chest pain. Tolerating liquid diet. No abdominal or left flank pain.   ROS: Denies dyspnea, palpitations, dizziness or lightheadedness. No difficulty urinating reported.  Objective:  Vitals:   04/13/17 1930 04/13/17 2011 04/14/17 0511 04/14/17 1007  BP: 128/75 (!) 166/72 (!) 158/71 (!) 159/80  Pulse: 69 69 61 77  Resp: (!) 22 17 17 18   Temp:  99 F (37.2 C) 98.2 F (36.8 C) 98.1 F (36.7 C)  TempSrc:    Oral  SpO2: 96% 98% 96% 97%  Weight:  61.2 kg (135 lb)    Height:  5\' 3"  (  1.6 m)      Examination:  General exam: Pleasant elderly female, moderately built and nourished, sitting up comfortably in bed. Does not appear in distress. Does not appear septic or toxic. Respiratory system: Clear to auscultation.  Respiratory effort normal. Cardiovascular system: S1 & S2 heard, RRR. No JVD, murmurs, rubs, gallops or clicks. No pedal edema. Telemetry: Sinus rhythm. Gastrointestinal system: Abdomen is nondistended, soft and nontender. No organomegaly or masses felt. Normal bowel sounds heard. No renal angle tenderness appreciated. Central nervous system: Alert and oriented. No focal neurological deficits. Extremities: Symmetric 5 x 5 power. Skin: No rashes, lesions or ulcers Psychiatry: Judgement and insight appear normal. Mood & affect appropriate.     Data Reviewed: I have personally reviewed following labs and imaging studies  CBC:  Recent Labs Lab 04/13/17 1040  WBC 10.3  HGB 12.4  HCT 37.8  MCV 90.0  PLT 614   Basic Metabolic Panel:  Recent Labs Lab 04/13/17 1040 04/13/17 1644 04/13/17 1830 04/14/17 0809  NA 139  --   --  140  K 2.5*  --  3.1* 2.8*  CL 103  --   --  106  CO2 26  --   --  24  GLUCOSE 138*  --   --  102*  BUN 13  --   --  11  CREATININE 1.01*  --   --  0.90  CALCIUM 8.8*  --   --  8.0*  MG  --  1.7  --   --    Liver Function Tests:  Recent Labs Lab 04/13/17 1040  AST 17  ALT 11*  ALKPHOS 49  BILITOT 1.3*  PROT 6.5  ALBUMIN 3.6   Coagulation Profile: No results for input(s): INR, PROTIME in the last 168 hours. Cardiac Enzymes:  Recent Labs Lab 04/13/17 1011 04/13/17 1327 04/13/17 1505 04/13/17 1830 04/13/17 2010  TROPONINI 0.05* 0.05* 0.05* 0.05* 0.04*   HbA1C: No results for input(s): HGBA1C in the last 72 hours. CBG: No results for input(s): GLUCAP in the last 168 hours.  Recent Results (from the past 240 hour(s))  Culture, blood (Routine X 2) w Reflex to ID Panel     Status: None (Preliminary result)   Collection Time: 04/13/17  2:58 PM  Result Value Ref Range Status   Specimen Description BLOOD RIGHT ANTECUBITAL  Final   Special Requests   Final    BOTTLES DRAWN AEROBIC AND ANAEROBIC Blood Culture adequate volume   Culture NO  GROWTH < 24 HOURS  Final   Report Status PENDING  Incomplete  Culture, blood (Routine X 2) w Reflex to ID Panel     Status: None (Preliminary result)   Collection Time: 04/13/17  3:09 PM  Result Value Ref Range Status   Specimen Description BLOOD RIGHT WRIST  Final   Special Requests   Final    BOTTLES DRAWN AEROBIC AND ANAEROBIC Blood Culture adequate volume   Culture NO GROWTH < 24 HOURS  Final   Report Status PENDING  Incomplete         Radiology Studies: Ct Abdomen Pelvis W Contrast  Result Date: 04/13/2017 CLINICAL DATA:  Epigastric discomfort with nausea and vomiting since this morning. EXAM: CT ABDOMEN AND PELVIS WITH CONTRAST TECHNIQUE: Multidetector CT imaging of the abdomen and pelvis was performed using the standard protocol following bolus administration of intravenous contrast. CONTRAST:  80 cc ISOVUE-300 IOPAMIDOL (ISOVUE-300) INJECTION 61% COMPARISON:  CT abdomen and pelvis dated May 31, 2012. FINDINGS: Lower chest: No  acute abnormality. Hepatobiliary: Hepatomegaly with innumerable low-density cysts throughout the liver, some of which have increased in size when compared to prior study. Status post cholecystectomy with unchanged prominent intra and extrahepatic bile ducts, likely reflecting post cholecystectomy state. Pancreas: Unremarkable. No pancreatic ductal dilatation or surrounding inflammatory changes. Spleen: Normal in size without focal abnormality. Adrenals/Urinary Tract: The adrenal glands are unremarkable. Innumerable bilateral renal cysts are again seen, some of which have increased in size when compared to prior study. No hydronephrosis. The bladder is unremarkable. Stomach/Bowel: Moderate hiatal hernia. The appendix is not definitively seen, however there are no secondary signs of inflammation in the right lower quadrant. No bowel wall thickening, distention, or surrounding inflammatory change. Vascular/Lymphatic: Aortic atherosclerosis. No enlarged abdominal or  pelvic lymph nodes. Reproductive: Status post hysterectomy. No adnexal masses. Other: No abdominal wall hernia or abnormality. No abdominopelvic ascites. Musculoskeletal: No acute or significant osseous findings. Severe degenerative disc disease at L2-L3. IMPRESSION: 1. No acute intra-abdominal process. 2. Polycystic kidney and liver disease, with slight interval increase in size of innumerable cysts in the liver and kidneys. 3.  Aortic atherosclerosis (ICD10-I70.0). Electronically Signed   By: Titus Dubin M.D.   On: 04/13/2017 13:16   Dg Chest Port 1 View  Result Date: 04/13/2017 CLINICAL DATA:  Hypoxia. EXAM: PORTABLE CHEST 1 VIEW COMPARISON:  05/06/2012 FINDINGS: The heart size and mediastinal contours are within normal limits. Both lungs are clear. The visualized skeletal structures are unremarkable. IMPRESSION: No active disease. Electronically Signed   By: Kerby Moors M.D.   On: 04/13/2017 13:47        Scheduled Meds: . amLODipine  5 mg Oral Daily  . estrogens (conjugated)  0.625 mg Oral Daily  . heparin  5,000 Units Subcutaneous Q8H  . labetalol  300 mg Oral BID  . pantoprazole  40 mg Oral Daily  . rosuvastatin  20 mg Oral Daily  . sertraline  100 mg Oral BID   Continuous Infusions: . sodium chloride 100 mL/hr at 04/14/17 1055  . cefTRIAXone (ROCEPHIN)  IV Stopped (04/13/17 1222)     LOS: 1 day     Mercie Balsley, MD, FACP, FHM. [facility] Pager [phone]  If 7PM-7AM, please contact night-coverage [email or web address] Password [facility] 04/14/2017, 11:25 AM

## 2017-04-15 LAB — BASIC METABOLIC PANEL
Anion gap: 6 (ref 5–15)
BUN: 10 mg/dL (ref 6–20)
CALCIUM: 8 mg/dL — AB (ref 8.9–10.3)
CHLORIDE: 107 mmol/L (ref 101–111)
CO2: 25 mmol/L (ref 22–32)
CREATININE: 0.94 mg/dL (ref 0.44–1.00)
GFR calc Af Amer: >60 mL/min (ref >60)
GFR calc non Af Amer: 57 mL/min — ABNORMAL LOW (ref >60)
Glucose, Bld: 110 mg/dL — ABNORMAL HIGH (ref 65–99)
POTASSIUM: 3.3 mmol/L — AB (ref 3.5–5.1)
SODIUM: 138 mmol/L (ref 135–145)

## 2017-04-15 LAB — MAGNESIUM: Magnesium: 2 mg/dL (ref 1.7–2.4)

## 2017-04-15 MED ORDER — POTASSIUM CHLORIDE CRYS ER 20 MEQ PO TBCR
40.0000 meq | EXTENDED_RELEASE_TABLET | Freq: Once | ORAL | Status: AC
  Administered 2017-04-15: 40 meq via ORAL
  Filled 2017-04-15: qty 2

## 2017-04-15 NOTE — Progress Notes (Signed)
PROGRESS NOTE   Alyssa Fernandez  AJG:[id number]    DOB: 04/02/1941    DOA: 04/13/2017  PCP: Biagio Borg, MD   I have briefly reviewed patients previous medical records in [facility].  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 In ED, febrile to 101.5, no leukocytosis, positive UA, normal lactate. Admitted for presumed acute pyelonephritis. Improving.Preliminary urine culture result shows gram-negative rods, sensitivities pending.   Assessment & Plan:   Active Problems:   Hypothyroidism   Hyperlipidemia   Essential hypertension   GERD   Renal failure   Disorder of bone and cartilage   Polycystic kidney   INSOMNIA   Anxiety   Normocytic anemia   Peripheral neuropathy (HCC)   Memory loss   Chronic low back pain   Acute pyelonephritis   Pyelonephritis   1. Acute pyelonephritis, gram-negative rods: Complicating underlying polycystic kidney disease. Urine microscopy on admission showed many bacteria, too numerous to count white blood cells and positive nitrites. Empirically started on IV ceftriaxone. Blood cultures 2: Negative to date. Urine cultures: Preliminary result shows >100 K colonies of gram-negative rods. Not sure if her GU anatomy i.e. rectocele/cystocele is contributing to her getting a UTI. CT abdomen and pelvis however does not show the rectocele or cystocele. Continue outpatient urology follow-up. I discussed with microbiology lab on 8/26 and indicated that they suspect to bacteria and will have final results hopefully  by tomorrow. Discussed with patient, agreeable to having new IV line placed and continue current IV ceftriaxone pending final sensitivities. Upon chart review, seen in ED on 03/08/17 at which time assessed as UTI, cystocele and had been discharged on Keflex. 2. Nausea and vomiting, non-intractable: Likely related to problem #1. Resolved. Tolerating soft diet. 3. Severe hypokalemia: Secondary to GI losses. Replace aggressively and follow. Magnesium 1.7. Potassium improved from 2.8-3.3. Continue to replace. 4. Essential hypertension: Mildly uncontrolled at times. Continue amlodipine and labetalol. No change. 5. Hyperlipidemia: Continue statins. 6. Atypical chest pain: Likely GI origin. Very transient and lasted a couple of minutes and has not recurred. Physically quite active and takes care of her husband with dementia. No prior history of chest pain. EKG shows sinus rhythm without acute changes. Minimally elevated troponin with flat trend likely from demand. No further workup at this time. No recurrence. 7. Prolonged QTC: EKG on admission showed QTC of 511 ms. Replace potassium. Potassium 3.3. Magnesium 2. Follow EKG in a.m. 8. GERD: Continue PPI. 9. Polycystic kidney and liver disease: As per CT abdomen report, slight interval increase in size of innumerable cysts in the liver and kidneys compared to CT from October 2013.   DVT prophylaxis: Subcutaneous heparin Code Status: Full Family Communication: None at bedside Disposition: DC home when medically improved, possibly 8/27   Consultants:  None   Procedures:  None  Antimicrobials:  IV ceftriaxone 04/13/17    Subjective: Overnight events noted. As per patient, left IV line was leaking overnight and was not addressed in a timely fashion. Denies physical complaints. Tolerating soft diet without nausea, vomiting, chest or abdominal pain. No fever or chills reported. No dysuria. Reassured her.  ROS: Denies dyspnea, palpitations, dizziness or  lightheadedness. No difficulty urinating reported.  Objective:  Vitals:   04/14/17 1853 04/14/17 2145 04/15/17 0504 04/15/17 1001  BP: (!) 154/72 (!) 184/86 (!) 158/78 (!) 143/74  Pulse: 63 68 65 (!) 58  Resp: 18 18 18 16   Temp: 98.7 F (37.1 C) 98.4 F (36.9 C) 98.1 F (36.7 C) 98.1 F (36.7 C)  TempSrc: Oral Oral Oral Oral  SpO2: 97% 93% 95% 97%  Weight:  63.1 kg (139 lb 1.6 oz)    Height:        Examination:  General exam: Pleasant elderly female, moderately built and nourished, sitting up comfortably in bed. Does not appear in distress. Does not appear septic or toxic.Stable. Seemed upset at the beginning of the visit but settled down after interview and examination and reassurance. Respiratory system: Clear to auscultation. Respiratory effort normal. Stable Cardiovascular system: S1 & S2 heard, RRR. No JVD, murmurs, rubs, gallops or clicks. No pedal edema. Telemetry: Sinus rhythm. Stable Gastrointestinal system: Abdomen is nondistended, soft and nontender. No organomegaly or masses felt. Normal bowel sounds heard. No renal angle tenderness appreciated. Stable Central nervous system: Alert and oriented. No focal neurological deficits. Extremities: Symmetric 5 x 5 power. Left upper extremity without acute signs at site of prior IV Skin: No rashes, lesions or ulcers Psychiatry: Judgement and insight appear normal. Mood & affect appropriate.     Data Reviewed: I have personally reviewed following labs and imaging studies  CBC:  Recent Labs Lab 04/13/17 1040  WBC 10.3  HGB 12.4  HCT 37.8  MCV 90.0  PLT 174   Basic Metabolic Panel:  Recent Labs Lab 04/13/17 1040 04/13/17 1644 04/13/17 1830 04/14/17 0809 04/15/17 0150  NA 139  --   --  140 138  K 2.5*  --  3.1* 2.8* 3.3*  CL 103  --   --  106 107  CO2 26  --   --  24 25  GLUCOSE 138*  --   --  102* 110*  BUN 13  --   --  11 10  CREATININE 1.01*  --   --  0.90 0.94  CALCIUM 8.8*  --   --  8.0* 8.0*  MG  --   1.7  --   --  2.0   Liver Function Tests:  Recent Labs Lab 04/13/17 1040  AST 17  ALT 11*  ALKPHOS 49  BILITOT 1.3*  PROT 6.5  ALBUMIN 3.6   Coagulation Profile:  Recent Labs Lab 04/14/17 0809  INR 1.14   Cardiac Enzymes:  Recent Labs Lab 04/13/17 1011 04/13/17 1327 04/13/17 1505 04/13/17 1830 04/13/17 2010  TROPONINI 0.05* 0.05* 0.05* 0.05* 0.04*   HbA1C: No results for input(s): HGBA1C in the last 72 hours. CBG: No results for input(s): GLUCAP in the last 168 hours.  Recent Results (from the past 240 hour(s))  Urine culture     Status: Abnormal (Preliminary result)   Collection Time: 04/13/17 12:31 PM  Result Value Ref Range Status   Specimen Description URINE, CLEAN CATCH  Final   Special Requests NONE  Final   Culture >=100,000 COLONIES/mL GRAM NEGATIVE RODS (A)  Final   Report Status PENDING  Incomplete  Culture, blood (Routine X 2) w Reflex to ID Panel     Status: None (Preliminary result)   Collection Time: 04/13/17  2:58 PM  Result Value Ref Range Status   Specimen Description BLOOD RIGHT ANTECUBITAL  Final   Special Requests  Final    BOTTLES DRAWN AEROBIC AND ANAEROBIC Blood Culture adequate volume   Culture NO GROWTH 2 DAYS  Final   Report Status PENDING  Incomplete  Culture, blood (Routine X 2) w Reflex to ID Panel     Status: None (Preliminary result)   Collection Time: 04/13/17  3:09 PM  Result Value Ref Range Status   Specimen Description BLOOD RIGHT WRIST  Final   Special Requests   Final    BOTTLES DRAWN AEROBIC AND ANAEROBIC Blood Culture adequate volume   Culture NO GROWTH 2 DAYS  Final   Report Status PENDING  Incomplete         Radiology Studies: Ct Abdomen Pelvis W Contrast  Result Date: 04/13/2017 CLINICAL DATA:  Epigastric discomfort with nausea and vomiting since this morning. EXAM: CT ABDOMEN AND PELVIS WITH CONTRAST TECHNIQUE: Multidetector CT imaging of the abdomen and pelvis was performed using the standard protocol  following bolus administration of intravenous contrast. CONTRAST:  80 cc ISOVUE-300 IOPAMIDOL (ISOVUE-300) INJECTION 61% COMPARISON:  CT abdomen and pelvis dated May 31, 2012. FINDINGS: Lower chest: No acute abnormality. Hepatobiliary: Hepatomegaly with innumerable low-density cysts throughout the liver, some of which have increased in size when compared to prior study. Status post cholecystectomy with unchanged prominent intra and extrahepatic bile ducts, likely reflecting post cholecystectomy state. Pancreas: Unremarkable. No pancreatic ductal dilatation or surrounding inflammatory changes. Spleen: Normal in size without focal abnormality. Adrenals/Urinary Tract: The adrenal glands are unremarkable. Innumerable bilateral renal cysts are again seen, some of which have increased in size when compared to prior study. No hydronephrosis. The bladder is unremarkable. Stomach/Bowel: Moderate hiatal hernia. The appendix is not definitively seen, however there are no secondary signs of inflammation in the right lower quadrant. No bowel wall thickening, distention, or surrounding inflammatory change. Vascular/Lymphatic: Aortic atherosclerosis. No enlarged abdominal or pelvic lymph nodes. Reproductive: Status post hysterectomy. No adnexal masses. Other: No abdominal wall hernia or abnormality. No abdominopelvic ascites. Musculoskeletal: No acute or significant osseous findings. Severe degenerative disc disease at L2-L3. IMPRESSION: 1. No acute intra-abdominal process. 2. Polycystic kidney and liver disease, with slight interval increase in size of innumerable cysts in the liver and kidneys. 3.  Aortic atherosclerosis (ICD10-I70.0). Electronically Signed   By: Titus Dubin M.D.   On: 04/13/2017 13:16   Dg Chest Port 1 View  Result Date: 04/13/2017 CLINICAL DATA:  Hypoxia. EXAM: PORTABLE CHEST 1 VIEW COMPARISON:  05/06/2012 FINDINGS: The heart size and mediastinal contours are within normal limits. Both lungs are  clear. The visualized skeletal structures are unremarkable. IMPRESSION: No active disease. Electronically Signed   By: Kerby Moors M.D.   On: 04/13/2017 13:47        Scheduled Meds: . amLODipine  5 mg Oral Daily  . estrogens (conjugated)  0.625 mg Oral Daily  . heparin  5,000 Units Subcutaneous Q8H  . labetalol  300 mg Oral BID  . pantoprazole  40 mg Oral Daily  . rosuvastatin  20 mg Oral Daily  . sertraline  100 mg Oral BID   Continuous Infusions: . cefTRIAXone (ROCEPHIN)  IV Stopped (04/14/17 1508)     LOS: 2 days     Harutyun Monteverde, MD, FACP, FHM. [facility] Pager [phone]  If 7PM-7AM, please contact night-coverage [email or web address] Password [facility] 04/15/2017, 11:58 AM

## 2017-04-15 NOTE — Progress Notes (Signed)
Pt BP was 167/76 given tylenol for knee pain and xanax, rechecked BP 137/67 no complain of pain.

## 2017-04-15 NOTE — Progress Notes (Signed)
Pt upset thinking that she had been bleeding from the iv site. It was explained that no potassium was going and was made an arrangement that another iv can be reinserted. She refused to have another iv since her statement was that she will be going home when she has no more fever and she is expecting to be dc. It was explained that she has the right to refused but the iv is in case of emergency and no significant iv infusion had been wasted. Bed sheets has been made and her gown had been changed. Iv pulled since she stated that it had been leaking since yesterday morning. Otherwise she said that she will wait for the md to tell her if she will be dc and if not then she might consider reinserting another iv line

## 2017-04-15 NOTE — Progress Notes (Signed)
Report given to [place] and bedside rounding done.

## 2017-04-16 LAB — BASIC METABOLIC PANEL
Anion gap: 8 (ref 5–15)
BUN: 13 mg/dL (ref 6–20)
CO2: 26 mmol/L (ref 22–32)
CREATININE: 0.87 mg/dL (ref 0.44–1.00)
Calcium: 8.8 mg/dL — ABNORMAL LOW (ref 8.9–10.3)
Chloride: 105 mmol/L (ref 101–111)
GFR calc Af Amer: >60 mL/min (ref >60)
Glucose, Bld: 96 mg/dL (ref 65–99)
POTASSIUM: 3.8 mmol/L (ref 3.5–5.1)
SODIUM: 139 mmol/L (ref 135–145)

## 2017-04-16 LAB — URINE CULTURE: Culture: >=100000 — AB

## 2017-04-16 MED ORDER — AMPHETAMINE-DEXTROAMPHETAMINE 10 MG PO TABS: 10.0000 mg | ORAL_TABLET | Freq: Every day | ORAL | Status: DC | PRN

## 2017-04-16 MED ORDER — CEPHALEXIN 500 MG PO CAPS: 500.0000 mg | ORAL_CAPSULE | Freq: Two times a day (BID) | ORAL | 0 refills | Status: AC

## 2017-04-16 NOTE — Discharge Summary (Signed)
Physician Discharge Summary  RYLAND SMOOTS KXF:[id number] DOB: 1941/08/02  PCP: Biagio Borg, MD  Admit date: 04/13/2017 Discharge date: 04/16/2017  Recommendations for Outpatient Follow-up:  1. Dr. Cathlean Cower, PCP in 4 days with repeat labs (CBC & BMP). Please follow final blood culture results that were sent from the hospital. 2. Patient states that she has outpatient follow-up with [facility] and is advised to keep that appointment.  Home Health: None Equipment/Devices: None    Discharge Condition: Improved and stable  CODE STATUS: Full  Diet recommendation: Heart healthy diet.  Discharge Diagnoses:  Active Problems:   Hypothyroidism   Hyperlipidemia   Essential hypertension   GERD   Renal failure   Disorder of bone and cartilage   Polycystic kidney   INSOMNIA   Anxiety   Normocytic anemia   Peripheral neuropathy (HCC)   Memory loss   Chronic low back pain   Acute pyelonephritis   Pyelonephritis   Brief Summary: 76 year old married female, active and takes care of all of her spouse with dementia, PMH of HTN, HLD, hypothyroid, anxiety, GERD, polycystic kidney disease, partial left nephrectomy 2013 for reportedly benign mass, being evaluated at [facility] in the last month due to difficulty urinating reportedly from cystocele/rectocele, presented to ED with epigastric/left abdominal and flank pain, episodes of nonbloody emesis, subjective fevers, transient chest pain during vomiting. In ED, febrile to 101.5, no leukocytosis, positive UA, normal lactate. Admitted for Acute pyelonephritis.   Assessment & Plan:   1. Acute pyelonephritis (Escherichia coli and Klebsiella pneumonia): Complicating underlying polycystic kidney disease. Urine microscopy on admission showed many bacteria, too numerous to count white blood cells and positive nitrites. Empirically started on IV ceftriaxone and has completed 4 days at time of discharge. Blood cultures 2: Negative to date.  Urine cultures: Confirmed >100 K colonies per mL of Escherichia coli and Klebsiella pneumoniae, both sensitive to Ceftriaxone. Not sure if her GU anatomy i.e. rectocele/cystocele is contributing to her getting a UTI. CT abdomen and pelvis however does not show the rectocele or cystocele. Continue outpatient urology follow-up. Upon chart review, seen in ED on 03/08/17 at which time assessed as UTI, cystocele and had been discharged on Keflex. Patient will be discharged on oral Keflex to complete an additional 3 days and hence total 7 days of antibiotic treatment. 2. Nausea and vomiting, non-intractable: Likely related to problem #1. Resolved. Tolerating soft diet. 3. Severe hypokalemia: Secondary to GI losses. Magnesium 1.7. Replaced. 4. Essential hypertension: Mildly uncontrolled at times. Continue amlodipine and labetalol. No change. Outpatient follow-up with PCP. 5. Hyperlipidemia: Continue statins. 6. Atypical chest pain: Likely GI origin. Very transient and lasted a couple of minutes at home and has not recurred. Physically quite active and takes care of her husband with dementia. No prior history of chest pain. EKG shows sinus rhythm without acute changes. Minimally elevated troponin with flat trend likely from demand. No further workup at this time. No recurrence. May consider outpatient cardiology consultation as deemed necessary. 7. Prolonged QTC: EKG on admission showed QTC of 511 ms. Replaced potassium. Magnesium 2. Repeat EKG today showed QTC 436. Resolved. 8. GERD: Continue PPI. 9. Polycystic kidney and liver disease: As per CT abdomen report, slight interval increase in size of innumerable cysts in the liver and kidneys compared to CT from October 2013. Outpatient follow-up.   Consultants:  None   Procedures:  None   Discharge Instructions  Discharge Instructions    Call MD for:  extreme fatigue  Complete by:  As directed    Call MD for:  persistant dizziness or  light-headedness    Complete by:  As directed    Call MD for:  persistant nausea and vomiting    Complete by:  As directed    Call MD for:  severe uncontrolled pain    Complete by:  As directed    Call MD for:  temperature >100.4    Complete by:  As directed    Diet - low sodium heart healthy    Complete by:  As directed    Increase activity slowly    Complete by:  As directed        Medication List    TAKE these medications   ALPRAZolam 0.5 MG tablet Commonly known as:  XANAX TAKE ONE TABLET AT BEDTIME AS NEEDED FORANXIETY   amLODipine 5 MG tablet Commonly known as:  NORVASC TAKE ONE TABLET EACH DAY   amphetamine-dextroamphetamine 10 MG tablet Commonly known as:  ADDERALL Take 1 tablet (10 mg total) by mouth daily as needed (to stay awake).   ARTIFICIAL TEARS OP Place 1 drop into both eyes daily as needed (dry eyes).   cephALEXin 500 MG capsule Commonly known as:  KEFLEX Take 1 capsule (500 mg total) by mouth 2 (two) times daily.   HYDROcodone-acetaminophen 5-325 MG tablet Commonly known as:  NORCO/VICODIN TAKE ONE TABLET TWICE DAILY AS NEEDED FOR MODERATE PAIN   labetalol 300 MG tablet Commonly known as:  NORMODYNE TAKE ONE TABLET TWICE DAILY   pantoprazole 40 MG tablet Commonly known as:  PROTONIX TAKE ONE TABLET BY MOUTH ONCE DAILY   PREMARIN 0.625 MG tablet Generic drug:  estrogens (conjugated) TAKE ONE TABLET EACH DAY FOR 21 DAYS THEN DO NOT TAKE FOR 7 DAYS   rosuvastatin 20 MG tablet Commonly known as:  CRESTOR TAKE ONE TABLET EVERY DAY   sertraline 100 MG tablet Commonly known as:  ZOLOFT TAKE ONE TABLET TWICE DAILY      Follow-up Information    Biagio Borg, MD. Schedule an appointment as soon as possible for a visit in 4 day(s).   Specialties:  Internal Medicine, Radiology Why:  To be seen with repeat labs (CBC & BMP). Contact information: [place] [address] [phone]          Allergies  Allergen  Reactions  . Codeine Swelling  . Sulfonamide Derivatives Swelling  . Zocor [Simvastatin] Other (See Comments)    Cognitive decrease      Procedures/Studies: Ct Abdomen Pelvis W Contrast  Result Date: 04/13/2017 CLINICAL DATA:  Epigastric discomfort with nausea and vomiting since this morning. EXAM: CT ABDOMEN AND PELVIS WITH CONTRAST TECHNIQUE: Multidetector CT imaging of the abdomen and pelvis was performed using the standard protocol following bolus administration of intravenous contrast. CONTRAST:  80 cc ISOVUE-300 IOPAMIDOL (ISOVUE-300) INJECTION 61% COMPARISON:  CT abdomen and pelvis dated May 31, 2012. FINDINGS: Lower chest: No acute abnormality. Hepatobiliary: Hepatomegaly with innumerable low-density cysts throughout the liver, some of which have increased in size when compared to prior study. Status post cholecystectomy with unchanged prominent intra and extrahepatic bile ducts, likely reflecting post cholecystectomy state. Pancreas: Unremarkable. No pancreatic ductal dilatation or surrounding inflammatory changes. Spleen: Normal in size without focal abnormality. Adrenals/Urinary Tract: The adrenal glands are unremarkable. Innumerable bilateral renal cysts are again seen, some of which have increased in size when compared to prior study. No hydronephrosis. The bladder is unremarkable. Stomach/Bowel: Moderate hiatal hernia. The  appendix is not definitively seen, however there are no secondary signs of inflammation in the right lower quadrant. No bowel wall thickening, distention, or surrounding inflammatory change. Vascular/Lymphatic: Aortic atherosclerosis. No enlarged abdominal or pelvic lymph nodes. Reproductive: Status post hysterectomy. No adnexal masses. Other: No abdominal wall hernia or abnormality. No abdominopelvic ascites. Musculoskeletal: No acute or significant osseous findings. Severe degenerative disc disease at L2-L3. IMPRESSION: 1. No acute intra-abdominal process. 2.  Polycystic kidney and liver disease, with slight interval increase in size of innumerable cysts in the liver and kidneys. 3.  Aortic atherosclerosis (ICD10-I70.0). Electronically Signed   By: Titus Dubin M.D.   On: 04/13/2017 13:16   Dg Chest Port 1 View  Result Date: 04/13/2017 CLINICAL DATA:  Hypoxia. EXAM: PORTABLE CHEST 1 VIEW COMPARISON:  05/06/2012 FINDINGS: The heart size and mediastinal contours are within normal limits. Both lungs are clear. The visualized skeletal structures are unremarkable. IMPRESSION: No active disease. Electronically Signed   By: Kerby Moors M.D.   On: 04/13/2017 13:47      Subjective: Patient denies complaints. Anxious to go home so that she can attend to her spouse. No fever, chills, nausea, vomiting or abdominal pain. Tolerating diet well.  Discharge Exam:  Vitals:   04/15/17 2203 04/15/17 2300 04/16/17 0411 04/16/17 0957  BP: (!) 167/76 137/67 (!) 142/82 (!) 150/75  Pulse: 63  73 65  Resp: 16  16 16   Temp: 98 F (36.7 C)  98 F (36.7 C) 98 F (36.7 C)  TempSrc: Oral  Oral Oral  SpO2: 98%  95% 98%  Weight: 63 kg (139 lb)     Height:        General exam: Pleasant elderly female, moderately built and nourished, sitting up comfortably in bed. Does not appear in distress. Does not appear septic or toxic. Respiratory system: Clear to auscultation. Respiratory effort normal.  Cardiovascular system: S1 & S2 heard, RRR. No JVD, murmurs, rubs, gallops or clicks. No pedal edema. Telemetry: Sinus rhythm.  Gastrointestinal system: Abdomen is nondistended, soft and nontender. No organomegaly or masses felt. Normal bowel sounds heard. No renal angle tenderness appreciated.  Central nervous system: Alert and oriented. No focal neurological deficits. Extremities: Symmetric 5 x 5 power.  Skin: No rashes, lesions or ulcers Psychiatry: Judgement and insight appear normal. Mood & affect appropriate. Patient was in good spirits this morning.    The results  of significant diagnostics from this hospitalization (including imaging, microbiology, ancillary and laboratory) are listed below for reference.     Microbiology: Recent Results (from the past 240 hour(s))  Urine culture     Status: Abnormal   Collection Time: 04/13/17 12:31 PM  Result Value Ref Range Status   Specimen Description URINE, CLEAN CATCH  Final   Special Requests NONE  Final   Culture (A)  Final    >=100,000 COLONIES/mL ESCHERICHIA COLI >=100,000 COLONIES/mL KLEBSIELLA PNEUMONIAE    Report Status 04/16/2017 FINAL  Final   Organism ID, Bacteria ESCHERICHIA COLI (A)  Final   Organism ID, Bacteria KLEBSIELLA PNEUMONIAE (A)  Final      Susceptibility   Escherichia coli - MIC*    AMPICILLIN >=32 RESISTANT Resistant     CEFAZOLIN <=4 SENSITIVE Sensitive     CEFTRIAXONE <=1 SENSITIVE Sensitive     CIPROFLOXACIN <=0.25 SENSITIVE Sensitive     GENTAMICIN <=1 SENSITIVE Sensitive     IMIPENEM <=0.25 SENSITIVE Sensitive     NITROFURANTOIN <=16 SENSITIVE Sensitive     TRIMETH/SULFA <=20 SENSITIVE Sensitive  AMPICILLIN/SULBACTAM 16 INTERMEDIATE Intermediate     PIP/TAZO <=4 SENSITIVE Sensitive     Extended ESBL NEGATIVE Sensitive     * >=100,000 COLONIES/mL ESCHERICHIA COLI   Klebsiella pneumoniae - MIC*    AMPICILLIN >=32 RESISTANT Resistant     CEFAZOLIN <=4 SENSITIVE Sensitive     CEFTRIAXONE <=1 SENSITIVE Sensitive     CIPROFLOXACIN <=0.25 SENSITIVE Sensitive     GENTAMICIN <=1 SENSITIVE Sensitive     IMIPENEM <=0.25 SENSITIVE Sensitive     NITROFURANTOIN 64 INTERMEDIATE Intermediate     TRIMETH/SULFA <=20 SENSITIVE Sensitive     AMPICILLIN/SULBACTAM 4 SENSITIVE Sensitive     PIP/TAZO <=4 SENSITIVE Sensitive     Extended ESBL NEGATIVE Sensitive     * >=100,000 COLONIES/mL KLEBSIELLA PNEUMONIAE  Culture, blood (Routine X 2) w Reflex to ID Panel     Status: None (Preliminary result)   Collection Time: 04/13/17  2:58 PM  Result Value Ref Range Status   Specimen  Description BLOOD RIGHT ANTECUBITAL  Final   Special Requests   Final    BOTTLES DRAWN AEROBIC AND ANAEROBIC Blood Culture adequate volume   Culture NO GROWTH 2 DAYS  Final   Report Status PENDING  Incomplete  Culture, blood (Routine X 2) w Reflex to ID Panel     Status: None (Preliminary result)   Collection Time: 04/13/17  3:09 PM  Result Value Ref Range Status   Specimen Description BLOOD RIGHT WRIST  Final   Special Requests   Final    BOTTLES DRAWN AEROBIC AND ANAEROBIC Blood Culture adequate volume   Culture NO GROWTH 2 DAYS  Final   Report Status PENDING  Incomplete     Labs: CBC:  Recent Labs Lab 04/13/17 1040  WBC 10.3  HGB 12.4  HCT 37.8  MCV 90.0  PLT 010   Basic Metabolic Panel:  Recent Labs Lab 04/13/17 1040 04/13/17 1644 04/13/17 1830 04/14/17 0809 04/15/17 0150 04/16/17 0535  NA 139  --   --  140 138 139  K 2.5*  --  3.1* 2.8* 3.3* 3.8  CL 103  --   --  106 107 105  CO2 26  --   --  24 25 26   GLUCOSE 138*  --   --  102* 110* 96  BUN 13  --   --  11 10 13   CREATININE 1.01*  --   --  0.90 0.94 0.87  CALCIUM 8.8*  --   --  8.0* 8.0* 8.8*  MG  --  1.7  --   --  2.0  --    Liver Function Tests:  Recent Labs Lab 04/13/17 1040  AST 17  ALT 11*  ALKPHOS 49  BILITOT 1.3*  PROT 6.5  ALBUMIN 3.6   Cardiac Enzymes:  Recent Labs Lab 04/13/17 1011 04/13/17 1327 04/13/17 1505 04/13/17 1830 04/13/17 2010  TROPONINI 0.05* 0.05* 0.05* 0.05* 0.04*   Urinalysis    Component Value Date/Time   COLORURINE AMBER (A) 04/13/2017 1040   APPEARANCEUR HAZY (A) 04/13/2017 1040   LABSPEC 1.009 04/13/2017 1040   PHURINE 6.0 04/13/2017 1040   GLUCOSEU NEGATIVE 04/13/2017 1040   GLUCOSEU NEGATIVE 04/03/2017 1013   HGBUR MODERATE (A) 04/13/2017 1040   HGBUR negative 01/31/2010 [place] 04/13/2017 1040   BILIRUBINUR n 04/12/2011 1413   KETONESUR NEGATIVE 04/13/2017 1040   PROTEINUR 30 (A) 04/13/2017 1040   UROBILINOGEN 0.2 04/03/2017  1013   NITRITE POSITIVE (A) 04/13/2017 1040   LEUKOCYTESUR  SMALL (A) 04/13/2017 1040      Time coordinating discharge: Over 30 minutes  SIGNED:  Vernell Leep, MD, FACP, FHM. [facility] Pager [phone]  If 7PM-7AM, please contact night-coverage [email or web address] Password [email or web address] 04/16/2017, 1:22 PM

## 2017-04-16 NOTE — Progress Notes (Signed)
Discharge instructions, medications/prescriptions and follow up appointment discussed and reviewed with pt, verbalized understanding. Telemetry dc'ed, CCMD notified. IV dc'ed, site clean and dry. Pt was escorted out of the unit in wheelchair, took all belongings with her.

## 2017-04-16 NOTE — Consult Note (Signed)
      [facility] CM Primary Care Navigator  04/16/2017  ZORANA BROCKWELL May 07, 1941 [id number]   Met with patient and daughter (Mary)at the bedside to identify possible discharge needs.  Daughter reports that patient was having nausea/ vomiting, chest pain and fever that had led to this admission. Patient confirmed that primary care provider [facility].   Patient shared using [facility] to obtain medications without difficulty.   Patientmanages hermedications at home using "pill box" system filled weekly.  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 Patient reports driving prior to admission. She goes to her doctors'appointments by herself but daughter has been with her to her appointment recently.    According to patient, daughter Loss adjuster, chartered) and her niece Darden Amber -from out of state) will be her and husband's primary caregivers at home after discharge.   Anticipated discharge plan is home according to patient.  Patientand daughter voiced understanding to call primary care provider's officewhen shereturns home,for a post discharge follow-up appointment within a week or sooner if needed.Patient letter (with PCP's contact number) was provided as a reminder.   Explained to patient and daughter regarding [facility] CM services available for health management but both denied any needs or concerns at this time. Patient declined EMMI General Calls for follow-up at home and states that her daughter will be available to monitor and assist with her recovery.  Patient and daughter voiced understanding to seek referral to [facility] care managementfrom primary care provider if deemed necessary and appropriatefor servicesin the future.   [facility] care management information provided for future needs that may arise.   For questions,  please contact:  Dannielle Huh, BSN, RN- [facility] Primary Care Navigator  Telephone: [phone] [place]

## 2017-04-16 NOTE — Discharge Instructions (Signed)

## 2017-04-18 ENCOUNTER — Telehealth: Payer: Self-pay | Admitting: *Deleted

## 2017-04-18 LAB — CULTURE, BLOOD (ROUTINE X 2)
CULTURE: NO GROWTH
Culture: NO GROWTH
SPECIAL REQUESTS: ADEQUATE
Special Requests: ADEQUATE

## 2017-04-18 NOTE — Telephone Encounter (Signed)
Transition Care Management Follow-up Telephone Call   Date discharged? 04/16/17   How have you been since you were released from the hospital? Pt states she is doing fine   Do you understand why you were in the hospital? YES   Do you understand the discharge instructions? YES   Where were you discharged to? Home   Items Reviewed:  Medications reviewed: YES  Allergies reviewed: YES  Dietary changes reviewed: NO  Referrals reviewed: No referral needed   Functional Questionnaire:   Activities of Daily Living (ADLs):   She states she are independent in the following: ambulation, bathing and hygiene, feeding, continence, grooming, toileting and dressing States she doesn't require assistance    Any transportation issues/concerns?: NO   Any patient concerns? NO   Confirmed importance and date/time of follow-up visits scheduled YES, 04/20/17  Provider Appointment booked with Dr. Jenny Reichmann  Confirmed with patient if condition begins to worsen call PCP or go to the ER.  Patient was given the office number and encouraged to call back with question or concerns.  : YES

## 2017-04-20 ENCOUNTER — Encounter: Payer: Self-pay | Admitting: Internal Medicine

## 2017-04-20 ENCOUNTER — Other Ambulatory Visit (INDEPENDENT_AMBULATORY_CARE_PROVIDER_SITE_OTHER): Payer: PPO

## 2017-04-20 ENCOUNTER — Ambulatory Visit (INDEPENDENT_AMBULATORY_CARE_PROVIDER_SITE_OTHER): Payer: PPO | Admitting: Internal Medicine

## 2017-04-20 VITALS — BP 100/64 | HR 63 | Temp 98.6°F | Ht 63.0 in | Wt 135.0 lb

## 2017-04-20 DIAGNOSIS — Z0000 Encounter for general adult medical examination without abnormal findings: Secondary | ICD-10-CM

## 2017-04-20 DIAGNOSIS — E876 Hypokalemia: Secondary | ICD-10-CM | POA: Diagnosis not present

## 2017-04-20 DIAGNOSIS — Z23 Encounter for immunization: Secondary | ICD-10-CM

## 2017-04-20 DIAGNOSIS — N10 Acute tubulo-interstitial nephritis: Secondary | ICD-10-CM

## 2017-04-20 DIAGNOSIS — I10 Essential (primary) hypertension: Secondary | ICD-10-CM | POA: Diagnosis not present

## 2017-04-20 LAB — CBC WITH DIFFERENTIAL/PLATELET
BASOS PCT: 0.7 % (ref 0.0–3.0)
Basophils Absolute: 0.1 10*3/uL (ref 0.0–0.1)
EOS ABS: 0.2 10*3/uL (ref 0.0–0.7)
Eosinophils Relative: 2.8 % (ref 0.0–5.0)
HCT: 36.9 % (ref 36.0–46.0)
Hemoglobin: 12.1 g/dL (ref 12.0–15.0)
LYMPHS ABS: 1.3 10*3/uL (ref 0.7–4.0)
Lymphocytes Relative: 15.4 % (ref 12.0–46.0)
MCHC: 32.9 g/dL (ref 30.0–36.0)
MCV: 90.1 fl (ref 78.0–100.0)
Monocytes Absolute: 0.6 10*3/uL (ref 0.1–1.0)
Monocytes Relative: 7.2 % (ref 3.0–12.0)
NEUTROS PCT: 73.9 % (ref 43.0–77.0)
Neutro Abs: 6.3 10*3/uL (ref 1.4–7.7)
PLATELETS: 309 10*3/uL (ref 150.0–400.0)
RBC: 4.1 Mil/uL (ref 3.87–5.11)
RDW: 13.9 % (ref 11.5–15.5)
WBC: 8.5 10*3/uL (ref 4.0–10.5)

## 2017-04-20 LAB — HEPATIC FUNCTION PANEL
ALK PHOS: 53 U/L (ref 39–117)
ALT: 11 U/L (ref 0–35)
AST: 14 U/L (ref 0–37)
Albumin: 4 g/dL (ref 3.5–5.2)
BILIRUBIN TOTAL: 0.5 mg/dL (ref 0.2–1.2)
Bilirubin, Direct: 0.2 mg/dL (ref 0.0–0.3)
Total Protein: 6.5 g/dL (ref 6.0–8.3)

## 2017-04-20 LAB — BASIC METABOLIC PANEL
BUN: 16 mg/dL (ref 6–23)
CO2: 27 mEq/L (ref 19–32)
Calcium: 9 mg/dL (ref 8.4–10.5)
Chloride: 99 mEq/L (ref 96–112)
Creatinine, Ser: 1.18 mg/dL (ref 0.40–1.20)
GFR: 47.08 mL/min — AB (ref >60.00)
Glucose, Bld: 107 mg/dL — ABNORMAL HIGH (ref 70–99)
POTASSIUM: 4.5 meq/L (ref 3.5–5.1)
SODIUM: 132 meq/L — AB (ref 135–145)

## 2017-04-20 NOTE — Assessment & Plan Note (Signed)
stable overall by history and exam, recent data reviewed with pt, and pt to continue medical treatment as before,  to f/u any worsening symptoms or concerns  

## 2017-04-20 NOTE — Assessment & Plan Note (Signed)
Mild to mod, for f/u lab today as recommended, to f/u any worsening symptoms or concerns

## 2017-04-20 NOTE — Assessment & Plan Note (Signed)
Clinically resolved, blood cultures neg, no further tx needed

## 2017-04-20 NOTE — Progress Notes (Signed)
Subjective:    Patient ID: Alyssa Fernandez, female    DOB: 1941/05/27, 76 y.o.   MRN: [id number]  HPI    Here to f/u recent hospn with acute pyelonephritis with n/v; Denies urinary symptoms such as dysuria, frequency, urgency, flank pain, hematuria or n/v, fever, chills.  Blood cultures neg at 7 days.  Finished antibx.  C/o fatigue, no other complaints.  Due for f/u cbc, cmp.  Has cpx next month, wants to put off for now.  Pt denies chest pain, increased sob or doe, wheezing, orthopnea, PND, increased LE swelling, palpitations, dizziness or syncope.  Pt denies new neurological symptoms such as new headache, or facial or extremity weakness or numbness   Pt denies fever, wt loss, night sweats, loss of appetite, or other constitutional symptoms  Asks for flu shot today, due for Tdap as well Past Medical History:  Diagnosis Date  . Anxiety   . 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 GERD   . Hyperlipidemia   . Hypertension   . HYPOTHYROIDISM   . INSOMNIA   . 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 OSTEOPENIA   . Polycystic kidney disease   . RENAL FAILURE   . VITAMIN D DEFICIENCY    Past Surgical History:  Procedure Laterality Date  . ABDOMINAL HYSTERECTOMY    . 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 bladder operation, but per sling procedure    . BUNIONECTOMY    . CERVICAL DISC SURGERY    . fx rt ankle    . left temporal bx artery-negative 2     . PARTIAL NEPHRECTOMY    . percutaneous drainage of liver abscess  05/06/2012    reports that she has never smoked. She has never used smokeless tobacco. She reports that she does not drink alcohol or use drugs. family history includes Stroke in her mother. Allergies  Allergen Reactions  . Codeine Swelling  . Sulfonamide Derivatives Swelling  . Zocor [Simvastatin] Other (See Comments)    Cognitive decrease   Current Outpatient Prescriptions on File Prior to Visit  Medication Sig Dispense Refill  . ALPRAZolam  (XANAX) 0.5 MG tablet TAKE ONE TABLET AT BEDTIME AS NEEDED FORANXIETY 90 tablet 1  . amLODipine (NORVASC) 5 MG tablet TAKE ONE TABLET EACH DAY 90 tablet 3  . amphetamine-dextroamphetamine (ADDERALL) 10 MG tablet Take 1 tablet (10 mg total) by mouth daily as needed (to stay awake).    . cephALEXin (KEFLEX) 500 MG capsule Take 1 capsule (500 mg total) by mouth 2 (two) times daily. 6 capsule 0  . HYDROcodone-acetaminophen (NORCO/VICODIN) 5-325 MG tablet TAKE ONE TABLET TWICE DAILY AS NEEDED FOR MODERATE PAIN 60 tablet 0  . Hypromellose (ARTIFICIAL TEARS OP) Place 1 drop into both eyes daily as needed (dry eyes).    . labetalol (NORMODYNE) 300 MG tablet TAKE ONE TABLET TWICE DAILY 180 tablet 1  . pantoprazole (PROTONIX) 40 MG tablet TAKE ONE TABLET BY MOUTH ONCE DAILY 90 tablet 3  . PREMARIN 0.625 MG tablet TAKE ONE TABLET EACH DAY FOR 21 DAYS THEN DO NOT TAKE FOR 7 DAYS 63 tablet 3  . rosuvastatin (CRESTOR) 20 MG tablet TAKE ONE TABLET EVERY DAY 90 tablet 1  . sertraline (ZOLOFT) 100 MG tablet TAKE ONE TABLET TWICE DAILY 180 tablet 1   No current facility-administered medications on file prior to visit.    Review of Systems  Constitutional: Negative for other unusual diaphoresis  or sweats HENT: Negative for ear discharge or swelling Eyes: Negative for other worsening visual disturbances Respiratory: Negative for stridor or other swelling  Gastrointestinal: Negative for worsening distension or other blood Genitourinary: Negative for retention or other urinary change Musculoskeletal: Negative for other MSK pain or swelling Skin: Negative for color change or other new lesions Neurological: Negative for worsening tremors and other numbness  Psychiatric/Behavioral: Negative for worsening agitation or other fatigue All other system neg per pt    Objective:   Physical Exam BP 100/64   Pulse 63   Temp 98.6 F (37 C) (Oral)   Ht 5\' 3"  (1.6 m)   Wt 135 lb (61.2 kg)   SpO2 98%   BMI 23.91 kg/m   VS noted, fatigued, not ill appearing Constitutional: Pt appears in NAD HENT: Head: NCAT.  Right Ear: External ear normal.  Left Ear: External ear normal.  Eyes: . Pupils are equal, round, and reactive to light. Conjunctivae and EOM are normal Nose: without d/c or deformity Neck: Neck supple. Gross normal ROM Cardiovascular: Normal rate and regular rhythm.   Pulmonary/Chest: Effort normal and breath sounds without rales or wheezing.  Abd:  Soft, NT, ND, + BS, no organomegaly Neurological: Pt is alert. At baseline orientation, motor grossly intact Skin: Skin is warm. No rashes, other new lesions, no LE edema Psychiatric: Pt behavior is normal without agitation  No other exam findings  Lab Results  Component Value Date   WBC 10.3 04/13/2017   HGB 12.4 04/13/2017   HCT 37.8 04/13/2017   PLT 171 04/13/2017   GLUCOSE 96 04/16/2017   CHOL 128 04/03/2017   TRIG 155.0 (H) 04/03/2017   HDL 47.40 04/03/2017   LDLCALC 50 04/03/2017   ALT 11 (L) 04/13/2017   AST 17 04/13/2017   NA 139 04/16/2017   K 3.8 04/16/2017   CL 105 04/16/2017   CREATININE 0.87 04/16/2017   BUN 13 04/16/2017   CO2 26 04/16/2017   TSH 3.72 04/03/2017   INR 1.14 04/14/2017   HGBA1C 5.9 05/19/2008    Culture, blood (Routine X 2) w Reflex to ID Panel  Order: [id number]  Status:  Final result Visible to patient:  Yes (MyChart) Next appt:  05/08/2017 at 08:30 AM in Internal Medicine Cathlean Cower, MD)  Component 7d ago  Specimen Description BLOOD RIGHT WRIST   Special Requests BOTTLES DRAWN AEROBIC AND ANAEROBIC Blood Culture adequate volume   Culture NO GROWTH 5 DAYS   Report Status 04/18/2017 FINAL            Assessment & Plan:

## 2017-04-20 NOTE — Patient Instructions (Addendum)
You had the flu shot and Tdap (tetanus) shot today  Please continue all other medications as before, and refills have been done if requested.  Please have the pharmacy call with any other refills you may need.  Please keep your appointments with your specialists as you may have planned  Please go to the LAB in the Basement (turn left off the elevator) for the tests to be done today  You will be contacted by phone if any changes need to be made immediately.  Otherwise, you will receive a letter about your results with an explanation, but please check with MyChart first.  Please remember to sign up for MyChart if you have not done so, as this will be important to you in the future with finding out test results, communicating by private email, and scheduling acute appointments online when needed.  OK to cancel the Sept 18 appt  Please return in 3 months, or sooner if needed, with Lab testing done 3-5 days before

## 2017-04-27 ENCOUNTER — Other Ambulatory Visit: Payer: Self-pay | Admitting: Internal Medicine

## 2017-04-27 NOTE — Telephone Encounter (Signed)
faxed

## 2017-04-27 NOTE — Telephone Encounter (Signed)
Done hardcopy to Shirron  

## 2017-05-06 ENCOUNTER — Encounter: Payer: Self-pay | Admitting: Internal Medicine

## 2017-05-08 ENCOUNTER — Other Ambulatory Visit: Payer: Self-pay | Admitting: Internal Medicine

## 2017-05-08 ENCOUNTER — Encounter: Payer: Medicare Other | Admitting: Internal Medicine

## 2017-05-14 ENCOUNTER — Telehealth: Payer: Self-pay

## 2017-05-14 NOTE — Telephone Encounter (Signed)
Approved 05/11/17-08/20/17  Determination and paper PA sent to scan.

## 2017-05-22 ENCOUNTER — Other Ambulatory Visit: Payer: Self-pay | Admitting: Internal Medicine

## 2017-05-23 DIAGNOSIS — N393 Stress incontinence (female) (male): Secondary | ICD-10-CM | POA: Diagnosis not present

## 2017-06-12 ENCOUNTER — Other Ambulatory Visit: Payer: Self-pay | Admitting: Internal Medicine

## 2017-06-12 MED ORDER — HYDROCODONE-ACETAMINOPHEN 5-325 MG PO TABS: 1.0000 | ORAL_TABLET | Freq: Two times a day (BID) | ORAL | 0 refills | Status: DC | PRN

## 2017-06-12 NOTE — Telephone Encounter (Signed)
MD is out of the office this week. Check [place] registry last filled 12/06/2016 pls advise...Johny Chess

## 2017-06-12 NOTE — Telephone Encounter (Signed)
pls sch OV w/Dr Jenny Reichmann

## 2017-06-12 NOTE — Telephone Encounter (Signed)
Cannot be filled without a visit as this is not a long term prescription. Last refilled in April.

## 2017-06-13 NOTE — Telephone Encounter (Signed)
Pt has been made aware of the denial and expressed understanding. She stated that she has an appt in December for a CPE.

## 2017-06-13 NOTE — Telephone Encounter (Signed)
SEE BELOW, RX shredded   Juliet Rude, CMA      9:20 AM  Note    Pt has been made aware of the denial and expressed understanding. She stated that she has an appt in December for a CPE.     June 12, 2017        4:33 PM  Hoyt Koch, MD routed this conversation to Juliet Rude, CMA  Hoyt Koch, MD      4:32 PM  Note    Cannot be filled without a visit as this is not a long term prescription. Last refilled in April.

## 2017-08-01 ENCOUNTER — Ambulatory Visit (INDEPENDENT_AMBULATORY_CARE_PROVIDER_SITE_OTHER): Payer: PPO | Admitting: Internal Medicine

## 2017-08-01 ENCOUNTER — Other Ambulatory Visit (INDEPENDENT_AMBULATORY_CARE_PROVIDER_SITE_OTHER): Payer: PPO

## 2017-08-01 ENCOUNTER — Encounter: Payer: Self-pay | Admitting: Internal Medicine

## 2017-08-01 VITALS — BP 138/84 | HR 61 | Temp 97.9°F | Ht 63.0 in | Wt 135.0 lb

## 2017-08-01 DIAGNOSIS — Z0001 Encounter for general adult medical examination with abnormal findings: Secondary | ICD-10-CM | POA: Diagnosis not present

## 2017-08-01 DIAGNOSIS — N289 Disorder of kidney and ureter, unspecified: Secondary | ICD-10-CM

## 2017-08-01 DIAGNOSIS — I700 Atherosclerosis of aorta: Secondary | ICD-10-CM

## 2017-08-01 DIAGNOSIS — Z0000 Encounter for general adult medical examination without abnormal findings: Secondary | ICD-10-CM | POA: Diagnosis not present

## 2017-08-01 DIAGNOSIS — N183 Chronic kidney disease, stage 3 unspecified: Secondary | ICD-10-CM | POA: Insufficient documentation

## 2017-08-01 DIAGNOSIS — E2839 Other primary ovarian failure: Secondary | ICD-10-CM | POA: Diagnosis not present

## 2017-08-01 HISTORY — DX: Chronic kidney disease, stage 3 unspecified: N18.30

## 2017-08-01 HISTORY — DX: Atherosclerosis of aorta: I70.0

## 2017-08-01 LAB — CBC WITH DIFFERENTIAL/PLATELET
BASOS ABS: 0 10*3/uL (ref 0.0–0.1)
Basophils Relative: 0.6 % (ref 0.0–3.0)
Eosinophils Absolute: 0.2 10*3/uL (ref 0.0–0.7)
Eosinophils Relative: 3 % (ref 0.0–5.0)
HEMATOCRIT: 37.9 % (ref 36.0–46.0)
HEMOGLOBIN: 12.8 g/dL (ref 12.0–15.0)
Lymphocytes Relative: 23.4 % (ref 12.0–46.0)
Lymphs Abs: 1.4 10*3/uL (ref 0.7–4.0)
MCHC: 33.7 g/dL (ref 30.0–36.0)
MCV: 90.1 fl (ref 78.0–100.0)
MONOS PCT: 8.2 % (ref 3.0–12.0)
Monocytes Absolute: 0.5 10*3/uL (ref 0.1–1.0)
Neutro Abs: 3.8 10*3/uL (ref 1.4–7.7)
Neutrophils Relative %: 64.8 % (ref 43.0–77.0)
Platelets: 214 10*3/uL (ref 150.0–400.0)
RBC: 4.2 Mil/uL (ref 3.87–5.11)
RDW: 13.8 % (ref 11.5–15.5)
WBC: 5.8 10*3/uL (ref 4.0–10.5)

## 2017-08-01 LAB — BASIC METABOLIC PANEL
BUN: 17 mg/dL (ref 6–23)
CALCIUM: 8.6 mg/dL (ref 8.4–10.5)
CO2: 27 meq/L (ref 19–32)
CREATININE: 1.09 mg/dL (ref 0.40–1.20)
Chloride: 104 mEq/L (ref 96–112)
GFR: 51.56 mL/min — ABNORMAL LOW (ref >60.00)
Glucose, Bld: 92 mg/dL (ref 70–99)
Potassium: 3.4 mEq/L — ABNORMAL LOW (ref 3.5–5.1)
Sodium: 139 mEq/L (ref 135–145)

## 2017-08-01 LAB — URINALYSIS, ROUTINE W REFLEX MICROSCOPIC
BILIRUBIN URINE: NEGATIVE
Hgb urine dipstick: NEGATIVE
KETONES UR: NEGATIVE
LEUKOCYTES UA: NEGATIVE
Nitrite: NEGATIVE
PH: 6 (ref 5.0–8.0)
RBC / HPF: NONE SEEN (ref 0–?)
SPECIFIC GRAVITY, URINE: 1.025 (ref 1.000–1.030)
Total Protein, Urine: NEGATIVE
URINE GLUCOSE: NEGATIVE
UROBILINOGEN UA: 1 (ref 0.0–1.0)

## 2017-08-01 LAB — TSH: TSH: 2.39 u[IU]/mL (ref 0.35–4.50)

## 2017-08-01 LAB — HEPATIC FUNCTION PANEL
ALBUMIN: 4.1 g/dL (ref 3.5–5.2)
ALK PHOS: 52 U/L (ref 39–117)
ALT: 9 U/L (ref 0–35)
AST: 13 U/L (ref 0–37)
Bilirubin, Direct: 0.1 mg/dL (ref 0.0–0.3)
TOTAL PROTEIN: 6.4 g/dL (ref 6.0–8.3)
Total Bilirubin: 0.5 mg/dL (ref 0.2–1.2)

## 2017-08-01 LAB — LIPID PANEL
CHOLESTEROL: 145 mg/dL (ref 0–200)
HDL: 48.6 mg/dL (ref >39.00)
LDL CALC: 70 mg/dL (ref 0–99)
NonHDL: 96.72
Total CHOL/HDL Ratio: 3
Triglycerides: 136 mg/dL (ref 0.0–149.0)
VLDL: 27.2 mg/dL (ref 0.0–40.0)

## 2017-08-01 MED ORDER — HYDROCODONE-ACETAMINOPHEN 5-325 MG PO TABS: 1.0000 | ORAL_TABLET | Freq: Two times a day (BID) | ORAL | 0 refills | Status: DC | PRN

## 2017-08-01 MED ORDER — ZOSTER VAC RECOMB ADJUVANTED 50 MCG/0.5ML IM SUSR
0.5000 mL | Freq: Once | INTRAMUSCULAR | 1 refills | Status: AC
Start: 2017-08-01 — End: 2017-08-01

## 2017-08-01 MED ORDER — ALPRAZOLAM 0.5 MG PO TABS: ORAL_TABLET | ORAL | 1 refills | Status: DC

## 2017-08-01 NOTE — Patient Instructions (Addendum)
Please schedule the bone density test before leaving today at the scheduling desk (where you check out)  Your Shingrix was sent to the pharmacy  Please continue all other medications as before, and refills have been done if requested - the xanax and vicodin  Please have the pharmacy call with any other refills you may need.  Please continue your efforts at being more active, low cholesterol diet, and weight control.  You are otherwise up to date with prevention measures today.  Please keep your appointments with your specialists as you may have planned  Please go to the LAB in the Basement (turn left off the elevator) for the tests to be done today  You will be contacted by phone if any changes need to be made immediately.  Otherwise, you will receive a letter about your results with an explanation, but please check with MyChart first.  Please remember to sign up for MyChart if you have not done so, as this will be important to you in the future with finding out test results, communicating by private email, and scheduling acute appointments online when needed.  Please return in 6 months, or sooner if needed

## 2017-08-01 NOTE — Assessment & Plan Note (Signed)
Mild worsening last yr, for f/u lab, consider renal referral

## 2017-08-01 NOTE — Progress Notes (Signed)
Subjective:    Patient ID: Alyssa Fernandez, female    DOB: 1940/09/23, 76 y.o.   MRN: [id number]  HPI  Here for wellness and f/u;  Overall doing ok;  Pt denies Chest pain, worsening SOB, DOE, wheezing, orthopnea, PND, worsening LE edema, palpitations, dizziness or syncope.  Pt denies neurological change such as new headache, facial or extremity weakness.  Pt denies polydipsia, polyuria, or low sugar symptoms. Pt states overall good compliance with treatment and medications, good tolerability, and has been trying to follow appropriate diet.  Pt denies worsening depressive symptoms, suicidal ideation or panic. No fever, night sweats, wt loss, loss of appetite, or other constitutional symptoms.  Pt states good ability with ADL's, has low fall risk, home safety reviewed and adequate, no other significant changes in hearing or vision, and only occasionally active with exercise.  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  Has not seen urology recently after a benign renal bx years ago.  Has not seen Renal.for many yrs, has been followed by previous PCP.  Getting by with tylenol prn, only uses the vicodin rarely. Past Medical History:  Diagnosis Date  . Anxiety   . Atherosclerosis of aorta ([place]) 08/01/2017  . 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 GERD   . Hyperlipidemia   . Hypertension   . HYPOTHYROIDISM   . INSOMNIA   . 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 OSTEOPENIA   . Polycystic kidney disease   . RENAL FAILURE   . VITAMIN D DEFICIENCY    Past Surgical History:  Procedure Laterality Date  . ABDOMINAL HYSTERECTOMY    . 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 bladder operation, but per sling procedure    . BUNIONECTOMY    . CERVICAL DISC SURGERY    . fx rt ankle    . left temporal bx artery-negative 2     . PARTIAL NEPHRECTOMY    . percutaneous drainage of liver abscess  05/06/2012    reports that  has never smoked. she has never used smokeless  tobacco. She reports that she does not drink alcohol or use drugs. family history includes Stroke in her mother. Allergies  Allergen Reactions  . Codeine Swelling  . Sulfonamide Derivatives Swelling  . Zocor [Simvastatin] Other (See Comments)    Cognitive decrease   Current Outpatient Medications on File Prior to Visit  Medication Sig Dispense Refill  . amLODipine (NORVASC) 5 MG tablet TAKE ONE TABLET EACH DAY 90 tablet 3  . amphetamine-dextroamphetamine (ADDERALL) 10 MG tablet Take 1 tablet (10 mg total) by mouth daily as needed (to stay awake).    . Hypromellose (ARTIFICIAL TEARS OP) Place 1 drop into both eyes daily as needed (dry eyes).    . labetalol (NORMODYNE) 300 MG tablet TAKE ONE TABLET TWICE DAILY 180 tablet 3  . pantoprazole (PROTONIX) 40 MG tablet TAKE ONE TABLET BY MOUTH ONCE DAILY 90 tablet 3  . potassium chloride SA (K-DUR,KLOR-CON) 20 MEQ tablet TAKE ONE TABLET TWICE DAILY 60 tablet 11  . PREMARIN 0.625 MG tablet TAKE ONE TABLET EACH DAY FOR 21 DAYS THEN DO NOT TAKE FOR 7 DAYS 63 tablet 3  . rosuvastatin (CRESTOR) 20 MG tablet TAKE ONE TABLET EACH DAY 90 tablet 3  . sertraline (ZOLOFT) 100 MG tablet TAKE ONE TABLET TWICE DAILY 180 tablet 3   No current facility-administered medications on file prior to visit.    Review  of Systems Constitutional: Negative for other unusual diaphoresis, sweats, appetite or weight changes HENT: Negative for other worsening hearing loss, ear pain, facial swelling, mouth sores or neck stiffness.   Eyes: Negative for other worsening pain, redness or other visual disturbance.  Respiratory: Negative for other stridor or swelling Cardiovascular: Negative for other palpitations or other chest pain  Gastrointestinal: Negative for worsening diarrhea or loose stools, blood in stool, distention or other pain Genitourinary: Negative for hematuria, flank pain or other change in urine volume.  Musculoskeletal: Negative for myalgias or other joint  swelling.  Skin: Negative for other color change, or other wound or worsening drainage.  Neurological: Negative for other syncope or numbness. Hematological: Negative for other adenopathy or swelling Psychiatric/Behavioral: Negative for hallucinations, other worsening agitation, SI, self-injury, or new decreased concentration All other system neg per pt    Objective:   Physical Exam BP 138/84   Pulse 61   Temp 97.9 F (36.6 C) (Oral)   Ht 5\' 3"  (1.6 m)   Wt 135 lb (61.2 kg)   SpO2 96%   BMI 23.91 kg/m  VS noted,  Constitutional: Pt is oriented to person, place, and time. Appears well-developed and well-nourished, in no significant distress and comfortable Head: Normocephalic and atraumatic  Eyes: Conjunctivae and EOM are normal. Pupils are equal, round, and reactive to light Right Ear: External ear normal without discharge Left Ear: External ear normal without discharge Nose: Nose without discharge or deformity Mouth/Throat: Oropharynx is without other ulcerations and moist  Neck: Normal range of motion. Neck supple. No JVD present. No tracheal deviation present or significant neck LA or mass Cardiovascular: Normal rate, regular rhythm, normal heart sounds and intact distal pulses.   Pulmonary/Chest: WOB normal and breath sounds without rales or wheezing  Abdominal: Soft. Bowel sounds are normal. NT. No HSM  Musculoskeletal: Normal range of motion. Exhibits no edema Lymphadenopathy: Has no other cervical adenopathy.  Neurological: Pt is alert and oriented to person, place, and time. Pt has normal reflexes. No cranial nerve deficit. Motor grossly intact, Gait intact Skin: Skin is warm and dry. No rash noted or new ulcerations Psychiatric:  Has normal mood and affect. Behavior is normal without agitation No other exam findings Lab Results  Component Value Date   WBC 5.8 08/01/2017   HGB 12.8 08/01/2017   HCT 37.9 08/01/2017   PLT 214.0 08/01/2017   GLUCOSE 92 08/01/2017    CHOL 145 08/01/2017   TRIG 136.0 08/01/2017   HDL 48.60 08/01/2017   LDLCALC 70 08/01/2017   ALT 9 08/01/2017   AST 13 08/01/2017   NA 139 08/01/2017   K 3.4 (L) 08/01/2017   CL 104 08/01/2017   CREATININE 1.09 08/01/2017   BUN 17 08/01/2017   CO2 27 08/01/2017   TSH 2.39 08/01/2017   INR 1.14 04/14/2017   HGBA1C 5.9 05/19/2008      Assessment & Plan:

## 2017-08-01 NOTE — Assessment & Plan Note (Signed)

## 2017-08-02 ENCOUNTER — Ambulatory Visit (INDEPENDENT_AMBULATORY_CARE_PROVIDER_SITE_OTHER)
Admission: RE | Admit: 2017-08-02 | Discharge: 2017-08-02 | Disposition: A | Discharge status: Home / Self Care | Payer: PPO | Source: Ambulatory Visit | Attending: Internal Medicine | Admitting: Internal Medicine

## 2017-08-02 DIAGNOSIS — E2839 Other primary ovarian failure: Secondary | ICD-10-CM | POA: Diagnosis not present

## 2017-08-06 ENCOUNTER — Telehealth: Payer: Self-pay

## 2017-08-06 NOTE — Telephone Encounter (Signed)
-----   Message from Biagio Borg, MD sent at 08/06/2017  1:04 PM EST ----- Left message on MyChart, pt to cont same tx except  The test results show that your current treatment is OK, except the Bone Density test is consistent with osteoporosis.  I would like to have our office try to arrange for new start of Prolia injections twice yearly to help build up the bone and reduce risk of fractures.  The office should call to see if you are ok with this, or if you like, you can make an Appt to discuss.Redmond Baseman to please inform pt, and let me know if pt would like to start Prolia

## 2017-08-06 NOTE — Telephone Encounter (Signed)
Pt has been informed and expressed understanding. She wanted to research Prolia on her own and give [place] a call back with her decision.

## 2017-08-22 ENCOUNTER — Telehealth: Payer: Self-pay | Admitting: Internal Medicine

## 2017-08-22 NOTE — Telephone Encounter (Signed)
Jonelle Sidle will verify insurance for prolia and call patient back with summary of benefits for this injection

## 2017-08-22 NOTE — Telephone Encounter (Signed)
Patient would like to have the injection done here at our office. Can we start the preapproval process for her? Thanks!

## 2017-08-22 NOTE — Telephone Encounter (Signed)
This patient would like to have get the Shingrix injection. She would like a call when we have this available. Thanks!

## 2017-08-22 NOTE — Telephone Encounter (Signed)
Patient has been added to shingrix waitlist 

## 2017-08-28 ENCOUNTER — Telehealth: Payer: Self-pay

## 2017-08-28 NOTE — Telephone Encounter (Signed)
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

## 2017-10-01 ENCOUNTER — Other Ambulatory Visit: Payer: Self-pay | Admitting: Internal Medicine

## 2017-10-01 ENCOUNTER — Other Ambulatory Visit (INDEPENDENT_AMBULATORY_CARE_PROVIDER_SITE_OTHER): Payer: PPO

## 2017-10-01 ENCOUNTER — Encounter: Payer: Self-pay | Admitting: Internal Medicine

## 2017-10-01 DIAGNOSIS — R300 Dysuria: Secondary | ICD-10-CM

## 2017-10-01 LAB — URINALYSIS, ROUTINE W REFLEX MICROSCOPIC
BILIRUBIN URINE: NEGATIVE
NITRITE: NEGATIVE
Specific Gravity, Urine: >=1.03 — AB (ref 1.000–1.030)
TOTAL PROTEIN, URINE-UPE24: 100 — AB
Urine Glucose: NEGATIVE
Urobilinogen, UA: 0.2 (ref 0.0–1.0)
pH: 6 (ref 5.0–8.0)

## 2017-10-01 MED ORDER — CEPHALEXIN 500 MG PO CAPS: 500.0000 mg | ORAL_CAPSULE | Freq: Three times a day (TID) | ORAL | 0 refills | Status: AC

## 2017-10-01 NOTE — Telephone Encounter (Signed)
Ok to let pt know the labs are ordered

## 2017-10-02 ENCOUNTER — Telehealth: Payer: Self-pay

## 2017-10-02 NOTE — Telephone Encounter (Signed)
-----   Message from Biagio Borg, MD sent at 10/01/2017  5:42 PM EST ----- Left message on MyChart, pt to cont same tx except  The test results show that your current treatment is OK, except the urine testing is consistent with infection.  Please start cephalexin (an antibiotic) which is sent to your pharmacy.  The urine culture is pending, and you should be informed about this soon.Redmond Baseman to please inform pt, I will do rx

## 2017-10-02 NOTE — Telephone Encounter (Signed)
Pt has been informed and expressed understanding.  

## 2017-10-03 LAB — URINE CULTURE
MICRO NUMBER:: 90180004
SPECIMEN QUALITY: ADEQUATE

## 2017-10-12 ENCOUNTER — Encounter: Payer: Self-pay | Admitting: Internal Medicine

## 2017-10-15 ENCOUNTER — Encounter: Payer: Self-pay | Admitting: Internal Medicine

## 2017-10-15 ENCOUNTER — Other Ambulatory Visit: Payer: Self-pay | Admitting: Internal Medicine

## 2017-10-15 DIAGNOSIS — R300 Dysuria: Secondary | ICD-10-CM

## 2017-10-15 NOTE — Telephone Encounter (Signed)
Ok for shirron to let pt know - ok to come to lab for Urine studies to see if infection is present, thanks

## 2017-10-16 ENCOUNTER — Other Ambulatory Visit (INDEPENDENT_AMBULATORY_CARE_PROVIDER_SITE_OTHER): Payer: PPO

## 2017-10-16 DIAGNOSIS — R300 Dysuria: Secondary | ICD-10-CM

## 2017-10-16 LAB — URINALYSIS, ROUTINE W REFLEX MICROSCOPIC
BILIRUBIN URINE: NEGATIVE
HGB URINE DIPSTICK: NEGATIVE
Ketones, ur: NEGATIVE
LEUKOCYTES UA: NEGATIVE
NITRITE: NEGATIVE
RBC / HPF: NONE SEEN (ref 0–?)
Specific Gravity, Urine: 1.01 (ref 1.000–1.030)
Total Protein, Urine: NEGATIVE
Urine Glucose: NEGATIVE
Urobilinogen, UA: 0.2 (ref 0.0–1.0)
pH: 6 (ref 5.0–8.0)

## 2017-10-17 LAB — URINE CULTURE
MICRO NUMBER: 90249981
Result:: NO GROWTH
SPECIMEN QUALITY: ADEQUATE

## 2017-10-18 DIAGNOSIS — N3021 Other chronic cystitis with hematuria: Secondary | ICD-10-CM | POA: Diagnosis not present

## 2017-10-22 ENCOUNTER — Ambulatory Visit (HOSPITAL_COMMUNITY)
Admission: EM | Admit: 2017-10-22 | Discharge: 2017-10-22 | Disposition: A | Discharge status: Home / Self Care | Payer: PPO | Attending: Emergency Medicine | Admitting: Emergency Medicine

## 2017-10-22 ENCOUNTER — Ambulatory Visit (INDEPENDENT_AMBULATORY_CARE_PROVIDER_SITE_OTHER): Payer: PPO

## 2017-10-22 ENCOUNTER — Encounter (HOSPITAL_COMMUNITY): Payer: Self-pay | Admitting: Emergency Medicine

## 2017-10-22 DIAGNOSIS — J22 Unspecified acute lower respiratory infection: Secondary | ICD-10-CM | POA: Diagnosis not present

## 2017-10-22 DIAGNOSIS — R05 Cough: Secondary | ICD-10-CM | POA: Diagnosis not present

## 2017-10-22 MED ORDER — BENZONATATE 100 MG PO CAPS: 100.0000 mg | ORAL_CAPSULE | Freq: Three times a day (TID) | ORAL | 0 refills | Status: DC

## 2017-10-22 MED ORDER — AZITHROMYCIN 250 MG PO TABS: ORAL_TABLET | ORAL | 0 refills | Status: AC

## 2017-10-22 NOTE — ED Provider Notes (Signed)
[facility]    CSN: [id number] Arrival date & time: 10/22/17  1012     History   Chief Complaint Chief Complaint  Patient presents with  . Cough    HPI Alyssa Fernandez is a 77 y.o. female.   Alyssa Fernandez presents with complaints of persistent congested cough for approximately the past 10-12 days. She has nasal congestion. Cough is not typically productive. Occasional shortness of breath . She states she feels sore to her upper abdomen from coughing so much. Denies chest pain. Without increased leg or foot swelling. She has been taking an OTC cough syrup with an antihistamine in it which has not helped. Denies sore throat or ear pain. No known CHF history. States she felt feverish at initial onset of symptoms with tmax of 101. Hx of gerd, htn, renal disease. Does not smoke.    ROS per HPI.       Past Medical History:  Diagnosis Date  . Anxiety   . Atherosclerosis of aorta ([place]) 08/01/2017  . 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 GERD   . Hyperlipidemia   . Hypertension   . HYPOTHYROIDISM   . INSOMNIA   . 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 OSTEOPENIA   . Polycystic kidney disease   . RENAL FAILURE   . VITAMIN D DEFICIENCY     Patient Active Problem List   Diagnosis Date Noted  . Atherosclerosis of aorta ([place]) 08/01/2017  . Renal insufficiency 08/01/2017  . Acute pyelonephritis 04/13/2017  . Chronic low back pain 05/04/2016  . Easy bruising 12/02/2015  . Memory loss 09/17/2015  . Encounter for immunization 06/03/2015  . Fatigue 11/25/2014  . Degenerative joint disease 05/26/2014  . Hives 04/08/2014  . Dyspnea 02/03/2014  . Peripheral neuropathy ([place]) 02/05/2013  . Bilateral foot pain 11/20/2012  . Vaginal itching 06/26/2012  . Low blood pressure 06/19/2012  . Liver abscess 06/19/2012  . Normocytic anemia 05/07/2012  . Hypokalemia 05/06/2012  . Fever 05/06/2012  . Dehydration 05/06/2012  . Hepatic abscess  05/03/2012  . Rash 11/20/2011  . Preventative health care 11/16/2011  . Anxiety 11/16/2011  . 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 Essential hypertension 02/24/2010  . DERMATITIS, ATOPIC 01/20/2010  . Edema 12/28/2009  . VITAMIN D DEFICIENCY 11/10/2009  . UNS ADVRS EFF OTH RX MEDICINAL&BIOLOGICAL SBSTNC 11/10/2009  . TACHYCARDIA 10/05/2009  . OTHER POSTSURGICAL STATUS OTHER 07/21/2009  . POSTMENOPAUSAL SYNDROME 03/09/2009  . 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 INSOMNIA 06/17/2008  . Hypothyroidism 02/12/2008  . Renal failure 02/12/2008  . Polycystic kidney 02/12/2008  . GERD 06/12/2007  . Disorder of bone and cartilage 06/12/2007  . Hyperlipidemia 03/25/2007    Past Surgical History:  Procedure Laterality Date  . ABDOMINAL HYSTERECTOMY    . 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 bladder operation, but per sling procedure    . BUNIONECTOMY    . CERVICAL DISC SURGERY    . fx rt ankle    . left temporal bx artery-negative 2     . PARTIAL NEPHRECTOMY    . percutaneous drainage of liver abscess  05/06/2012    OB History    No data available       Home Medications    Prior to Admission medications   Medication Sig Start Date End Date Taking? Authorizing Provider  ALPRAZolam Duanne Moron) 0.5 MG tablet TAKE ONE TABLET AT BEDTIME AS NEEDED FORANXIETY 08/01/17  Biagio Borg, MD  amLODipine [facility]) 5 MG tablet TAKE ONE TABLET EACH DAY 12/05/16   Biagio Borg, MD  amphetamine-dextroamphetamine (ADDERALL) 10 MG tablet Take 1 tablet (10 mg total) by mouth daily as needed (to stay awake). 04/16/17   Hongalgi, Lenis Dickinson, MD  azithromycin (ZITHROMAX) 250 MG tablet Take 2 tablets (500 mg total) by mouth daily for 1 day, THEN 1 tablet (250 mg total) daily for 4 days. 10/22/17 10/27/17  Zigmund Gottron, NP  benzonatate (TESSALON) 100 MG capsule Take 1 capsule (100 mg total) by mouth every 8 (eight) hours. 10/22/17   Zigmund Gottron, NP  HYDROcodone-acetaminophen  (NORCO/VICODIN) 5-325 MG tablet Take 1 tablet by mouth 2 (two) times daily as needed for moderate pain. 08/01/17   Biagio Borg, MD  Hypromellose (ARTIFICIAL TEARS OP) Place 1 drop into both eyes daily as needed (dry eyes).    [provider]  labetalol (NORMODYNE) 300 MG tablet TAKE ONE TABLET TWICE DAILY 05/08/17   Biagio Borg, MD  pantoprazole (PROTONIX) 40 MG tablet TAKE ONE TABLET BY MOUTH ONCE DAILY 12/05/16   Biagio Borg, MD  potassium chloride SA (K-DUR,KLOR-CON) 20 MEQ tablet TAKE ONE TABLET TWICE DAILY 05/22/17   Biagio Borg, MD  PREMARIN 0.625 MG tablet TAKE ONE TABLET EACH DAY FOR 21 DAYS THEN DO NOT TAKE FOR 7 DAYS 12/05/16   Biagio Borg, MD  rosuvastatin (CRESTOR) 20 MG tablet TAKE ONE TABLET EACH DAY 05/22/17   Biagio Borg, MD  sertraline (ZOLOFT) 100 MG tablet TAKE ONE TABLET TWICE DAILY 04/27/17   Biagio Borg, MD    Family History Family History  Problem Relation Age of Onset  . Stroke Mother     Social History Social History   Tobacco Use  . Smoking status: Never Smoker  . Smokeless tobacco: Never Used  Substance Use Topics  . Alcohol use: No  . Drug use: No     Allergies   Codeine; Sulfonamide derivatives; and Zocor [simvastatin]   Review of Systems Review of Systems   Physical Exam Triage Vital Signs ED Triage Vitals [10/22/17 1133]  Enc Vitals Group     BP (!) 146/105     Pulse Rate 62     Resp 18     Temp 98.5 F (36.9 C)     Temp Source Oral     SpO2 98 %     Weight      Height      Head Circumference      Peak Flow      Pain Score      Pain Loc      Pain Edu?      Excl. in [place]?    No data found.  Updated Vital Signs BP (!) 146/105 (BP Location: Left Arm)   Pulse 62   Temp 98.5 F (36.9 C) (Oral)   Resp 18   SpO2 98%   Visual Acuity Right Eye Distance:   Left Eye Distance:   Bilateral Distance:    Right Eye Near:   Left Eye Near:    Bilateral Near:     Physical Exam  Constitutional: She is oriented to  person, place, and time. She appears well-developed and well-nourished. No distress.  HENT:  Head: Normocephalic and atraumatic.  Right Ear: Tympanic membrane, external ear and ear canal normal.  Left Ear: Tympanic membrane, external ear and ear canal normal.  Nose: Rhinorrhea present. Right sinus exhibits no maxillary  sinus tenderness and no frontal sinus tenderness. Left sinus exhibits no maxillary sinus tenderness and no frontal sinus tenderness.  Mouth/Throat: Uvula is midline, oropharynx is clear and moist and mucous membranes are normal. No tonsillar exudate.  Eyes: Conjunctivae and EOM are normal. Pupils are equal, round, and reactive to light.  Cardiovascular: Normal rate, regular rhythm and normal heart sounds.  Pulmonary/Chest: Effort normal. She has decreased breath sounds in the right lower field and the left lower field.  Strong congested cough noted  Neurological: She is alert and oriented to person, place, and time.  Skin: Skin is warm and dry.     UC Treatments / Results  Labs (all labs ordered are listed, but only abnormal results are displayed) Labs Reviewed - No data to display  EKG  EKG Interpretation None       Radiology Dg Chest 2 View  Result Date: 10/22/2017 CLINICAL DATA:  Nonproductive cough for the past 2 weeks. EXAM: CHEST  2 VIEW COMPARISON:  Chest x-ray dated August 24th 2018. FINDINGS: The cardiac silhouette is at the upper limits of normal in size. Normal mediastinal contours. Normal pulmonary vascularity. Atherosclerotic calcification of the aortic arch. No focal consolidation, pleural effusion, or pneumothorax. No acute osseous abnormality. IMPRESSION: No active cardiopulmonary disease. Electronically Signed   By: Titus Dubin M.D.   On: 10/22/2017 11:51    Procedures Procedures (including critical care time)  Medications Ordered in UC Medications - No data to display   Initial Impression / Assessment and Plan / UC Course  I have reviewed  the triage vital signs and the nursing notes.  Pertinent labs & imaging results that were available during my care of the patient were reviewed by me and considered in my medical decision making (see chart for details).     Chest xray reassuring today. 10 days of symptoms with congestion and cough. Will provide coverage with azithromycin at this time, tessalon as needed. Return precautions provided. If symptoms worsen or do not improve in the next week to return to be seen or to follow up with PCP.  Patient verbalized understanding and agreeable to plan.    Final Clinical Impressions(s) / UC Diagnoses   Final diagnoses:  Lower respiratory tract infection    ED Discharge Orders        Ordered    azithromycin (ZITHROMAX) 250 MG tablet     10/22/17 1214    benzonatate (TESSALON) 100 MG capsule  Every 8 hours     10/22/17 1214       Controlled Substance Prescriptions [place] Controlled Substance Registry consulted? Not Applicable   Zigmund Gottron, NP 10/22/17 1220

## 2017-10-22 NOTE — ED Triage Notes (Signed)
Pt here with cough x 2 weeks and congestion; pt sts some fever at times

## 2017-10-22 NOTE — Discharge Instructions (Signed)
Push fluids to ensure adequate hydration and keep secretions thin.  Tylenol and/or ibuprofen as needed for pain or fevers.  Complete course of antibiotics.  Tessalon every 8 hours as needed for cough. If symptoms worsen or do not improve in the next week to return to be seen or to follow up with your PCP.

## 2017-11-22 ENCOUNTER — Encounter: Payer: Self-pay | Admitting: Internal Medicine

## 2017-11-22 NOTE — Telephone Encounter (Signed)
Staff to assist pt with Nurse Visit to check BP

## 2017-11-26 ENCOUNTER — Other Ambulatory Visit: Payer: Self-pay | Admitting: Internal Medicine

## 2017-12-10 ENCOUNTER — Other Ambulatory Visit: Payer: Self-pay | Admitting: Internal Medicine

## 2018-01-18 DIAGNOSIS — N393 Stress incontinence (female) (male): Secondary | ICD-10-CM | POA: Diagnosis not present

## 2018-01-18 DIAGNOSIS — N3021 Other chronic cystitis with hematuria: Secondary | ICD-10-CM | POA: Diagnosis not present

## 2018-01-23 ENCOUNTER — Encounter: Payer: Self-pay | Admitting: Internal Medicine

## 2018-01-23 NOTE — Telephone Encounter (Signed)
Jonelle Sidle to see pt concern, thanks

## 2018-01-30 ENCOUNTER — Other Ambulatory Visit (INDEPENDENT_AMBULATORY_CARE_PROVIDER_SITE_OTHER): Payer: PPO

## 2018-01-30 ENCOUNTER — Encounter: Payer: Self-pay | Admitting: Internal Medicine

## 2018-01-30 ENCOUNTER — Ambulatory Visit: Payer: PPO | Admitting: Internal Medicine

## 2018-01-30 VITALS — BP 124/78 | HR 63 | Temp 97.8°F | Ht 63.0 in | Wt 141.0 lb

## 2018-01-30 DIAGNOSIS — M810 Age-related osteoporosis without current pathological fracture: Secondary | ICD-10-CM

## 2018-01-30 DIAGNOSIS — Z0000 Encounter for general adult medical examination without abnormal findings: Secondary | ICD-10-CM | POA: Diagnosis not present

## 2018-01-30 DIAGNOSIS — N183 Chronic kidney disease, stage 3 unspecified: Secondary | ICD-10-CM

## 2018-01-30 DIAGNOSIS — I10 Essential (primary) hypertension: Secondary | ICD-10-CM | POA: Diagnosis not present

## 2018-01-30 HISTORY — DX: Age-related osteoporosis without current pathological fracture: M81.0

## 2018-01-30 LAB — URINALYSIS, ROUTINE W REFLEX MICROSCOPIC
BILIRUBIN URINE: NEGATIVE
Hgb urine dipstick: NEGATIVE
Ketones, ur: NEGATIVE
LEUKOCYTES UA: NEGATIVE
Nitrite: NEGATIVE
PH: 6 (ref 5.0–8.0)
RBC / HPF: NONE SEEN (ref 0–?)
SPECIFIC GRAVITY, URINE: 1.025 (ref 1.000–1.030)
TOTAL PROTEIN, URINE-UPE24: NEGATIVE
Urine Glucose: NEGATIVE
Urobilinogen, UA: 0.2 (ref 0.0–1.0)

## 2018-01-30 LAB — HEPATIC FUNCTION PANEL
ALT: 12 U/L (ref 0–35)
AST: 16 U/L (ref 0–37)
Albumin: 4.2 g/dL (ref 3.5–5.2)
Alkaline Phosphatase: 55 U/L (ref 39–117)
BILIRUBIN TOTAL: 0.5 mg/dL (ref 0.2–1.2)
Bilirubin, Direct: 0.1 mg/dL (ref 0.0–0.3)
Total Protein: 6.5 g/dL (ref 6.0–8.3)

## 2018-01-30 LAB — CBC WITH DIFFERENTIAL/PLATELET
BASOS PCT: 0.7 % (ref 0.0–3.0)
Basophils Absolute: 0 10*3/uL (ref 0.0–0.1)
EOS ABS: 0.1 10*3/uL (ref 0.0–0.7)
EOS PCT: 2 % (ref 0.0–5.0)
HEMATOCRIT: 37.8 % (ref 36.0–46.0)
Hemoglobin: 12.9 g/dL (ref 12.0–15.0)
Lymphocytes Relative: 22.7 % (ref 12.0–46.0)
Lymphs Abs: 1.3 10*3/uL (ref 0.7–4.0)
MCHC: 34.2 g/dL (ref 30.0–36.0)
MCV: 88.4 fl (ref 78.0–100.0)
MONOS PCT: 8.7 % (ref 3.0–12.0)
Monocytes Absolute: 0.5 10*3/uL (ref 0.1–1.0)
Neutro Abs: 3.8 10*3/uL (ref 1.4–7.7)
Neutrophils Relative %: 65.9 % (ref 43.0–77.0)
Platelets: 233 10*3/uL (ref 150.0–400.0)
RBC: 4.27 Mil/uL (ref 3.87–5.11)
RDW: 13.1 % (ref 11.5–15.5)
WBC: 5.7 10*3/uL (ref 4.0–10.5)

## 2018-01-30 LAB — BASIC METABOLIC PANEL
BUN: 22 mg/dL (ref 6–23)
CO2: 27 mEq/L (ref 19–32)
Calcium: 8.9 mg/dL (ref 8.4–10.5)
Chloride: 104 mEq/L (ref 96–112)
Creatinine, Ser: 1.08 mg/dL (ref 0.40–1.20)
GFR: 52.04 mL/min — ABNORMAL LOW (ref >60.00)
GLUCOSE: 100 mg/dL — AB (ref 70–99)
Potassium: 3.6 mEq/L (ref 3.5–5.1)
SODIUM: 140 meq/L (ref 135–145)

## 2018-01-30 LAB — LIPID PANEL
Cholesterol: 143 mg/dL (ref 0–200)
HDL: 42.5 mg/dL (ref >39.00)
LDL Cholesterol: 64 mg/dL (ref 0–99)
NONHDL: 100.64
Total CHOL/HDL Ratio: 3
Triglycerides: 182 mg/dL — ABNORMAL HIGH (ref 0.0–149.0)
VLDL: 36.4 mg/dL (ref 0.0–40.0)

## 2018-01-30 LAB — VITAMIN D 25 HYDROXY (VIT D DEFICIENCY, FRACTURES): VITD: 26.66 ng/mL — AB (ref 30.00–100.00)

## 2018-01-30 LAB — TSH: TSH: 3.14 u[IU]/mL (ref 0.35–4.50)

## 2018-01-30 MED ORDER — ZOSTER VAC RECOMB ADJUVANTED 50 MCG/0.5ML IM SUSR: 0.5000 mL | Freq: Once | INTRAMUSCULAR | 1 refills | Status: AC

## 2018-01-30 MED ORDER — DENOSUMAB 60 MG/ML ~~LOC~~ SOSY
60.0000 mg | PREFILLED_SYRINGE | Freq: Once | SUBCUTANEOUS | Status: AC
  Administered 2018-01-30: 60 mg via SUBCUTANEOUS

## 2018-01-30 NOTE — Assessment & Plan Note (Signed)
stable overall by history and exam, recent data reviewed with pt, and pt to continue medical treatment as before,  to f/u any worsening symptoms or concerns, for f/u with labs today

## 2018-01-30 NOTE — Assessment & Plan Note (Signed)

## 2018-01-30 NOTE — Patient Instructions (Addendum)
You had the Prolia shot today  Your shingles shot prescription was given to you in hardcopy today  Please continue all other medications as before, and refills have been done if requested.  Please have the pharmacy call with any other refills you may need.  Please continue your efforts at being more active, low cholesterol diet, and weight control.  You are otherwise up to date with prevention measures today.  Please keep your appointments with your specialists as you may have planned  Please go to the LAB in the Basement (turn left off the elevator) for the tests to be done today  You will be contacted by phone if any changes need to be made immediately.  Otherwise, you will receive a letter about your results with an explanation, but please check with MyChart first.  Please remember to sign up for MyChart if you have not done so, as this will be important to you in the future with finding out test results, communicating by private email, and scheduling acute appointments online when needed.  Please return in 6 months, or sooner if needed, with Lab testing done 3-5 days before

## 2018-01-30 NOTE — Progress Notes (Signed)
Subjective:    Patient ID: Alyssa Fernandez, female    DOB: [birth year], 77 y.o.   MRN: [id number]  HPI Here for wellness and f/u;  Overall doing ok;  Pt denies Chest pain, worsening SOB, DOE, wheezing, orthopnea, PND, worsening LE edema, palpitations, dizziness or syncope.  Pt denies neurological change such as new headache, facial or extremity weakness.  Pt denies polydipsia, polyuria, or low sugar symptoms. Pt states overall good compliance with treatment and medications, good tolerability, and has been trying to follow appropriate diet.  Pt denies worsening depressive symptoms, suicidal ideation or panic. No fever, night sweats, wt loss, loss of appetite, or other constitutional symptoms.  Pt states good ability with ADL's, has low fall risk, home safety reviewed and adequate, no other significant changes in hearing or vision, and only occasionally active with exercise. Does c/o ongoing fatigue, but denies signficant daytime hypersomnolence, taking care of husband at home with dementia. Due for prolia today,  No other new complaints or interval hx Past Medical History:  Diagnosis Date  . Anxiety   . Atherosclerosis of aorta ([place]) 08/01/2017  . 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 GERD   . Hyperlipidemia   . Hypertension   . HYPOTHYROIDISM   . INSOMNIA   . 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 OSTEOPENIA   . Polycystic kidney disease   . RENAL FAILURE   . VITAMIN D DEFICIENCY    Past Surgical History:  Procedure Laterality Date  . ABDOMINAL HYSTERECTOMY    . 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 bladder operation, but per sling procedure    . BUNIONECTOMY    . CERVICAL DISC SURGERY    . fx rt ankle    . left temporal bx artery-negative 2     . PARTIAL NEPHRECTOMY    . percutaneous drainage of liver abscess  05/06/2012    reports that she has never smoked. She has never used smokeless tobacco. She reports that she does not drink alcohol or use  drugs. family history includes Stroke in her mother. Allergies  Allergen Reactions  . Codeine Swelling  . Sulfonamide Derivatives Swelling  . Zocor [Simvastatin] Other (See Comments)    Cognitive decrease   Current Outpatient Medications on File Prior to Visit  Medication Sig Dispense Refill  . ALPRAZolam (XANAX) 0.5 MG tablet TAKE ONE TABLET AT BEDTIME AS NEEDED FORANXIETY 90 tablet 1  . amLODipine (NORVASC) 5 MG tablet TAKE ONE TABLET EACH DAY 90 tablet 3  . amphetamine-dextroamphetamine (ADDERALL) 10 MG tablet Take 1 tablet (10 mg total) by mouth daily as needed (to stay awake).    . benzonatate (TESSALON) 100 MG capsule Take 1 capsule (100 mg total) by mouth every 8 (eight) hours. 21 capsule 0  . HYDROcodone-acetaminophen (NORCO/VICODIN) 5-325 MG tablet Take 1 tablet by mouth 2 (two) times daily as needed for moderate pain. 30 tablet 0  . Hypromellose (ARTIFICIAL TEARS OP) Place 1 drop into both eyes daily as needed (dry eyes).    . labetalol (NORMODYNE) 300 MG tablet TAKE ONE TABLET TWICE DAILY 180 tablet 3  . pantoprazole (PROTONIX) 40 MG tablet TAKE ONE TABLET BY MOUTH ONCE DAILY 90 tablet 3  . potassium chloride SA (K-DUR,KLOR-CON) 20 MEQ tablet TAKE ONE TABLET TWICE DAILY 60 tablet 11  . PREMARIN 0.625 MG tablet TAKE ONE TABLET EACH DAY FOR 21 DAYS THEN DO NOT TAKE FOR 7 DAYS 63 tablet 3  .  rosuvastatin (CRESTOR) 20 MG tablet TAKE ONE TABLET EACH DAY 90 tablet 3  . sertraline (ZOLOFT) 100 MG tablet TAKE ONE TABLET TWICE DAILY 180 tablet 3  . telmisartan (MICARDIS) 40 MG tablet TAKE ONE TABLET EACH DAY 90 tablet 2   No current facility-administered medications on file prior to visit.    Review of Systems Constitutional: Negative for other unusual diaphoresis, sweats, appetite or weight changes HENT: Negative for other worsening hearing loss, ear pain, facial swelling, mouth sores or neck stiffness.   Eyes: Negative for other worsening pain, redness or other visual disturbance.   Respiratory: Negative for other stridor or swelling Cardiovascular: Negative for other palpitations or other chest pain  Gastrointestinal: Negative for worsening diarrhea or loose stools, blood in stool, distention or other pain Genitourinary: Negative for hematuria, flank pain or other change in urine volume.  Musculoskeletal: Negative for myalgias or other joint swelling.  Skin: Negative for other color change, or other wound or worsening drainage.  Neurological: Negative for other syncope or numbness. Hematological: Negative for other adenopathy or swelling Psychiatric/Behavioral: Negative for hallucinations, other worsening agitation, SI, self-injury, or new decreased concentration All other system neg per pt    Objective:   Physical Exam BP 124/78   Pulse 63   Temp 97.8 F (36.6 C) (Oral)   Ht 5\' 3"  (1.6 m)   Wt 141 lb (64 kg)   SpO2 98%   BMI 24.98 kg/m  VS noted,  Constitutional: Pt is oriented to person, place, and time. Appears well-developed and well-nourished, in no significant distress and comfortable Head: Normocephalic and atraumatic  Eyes: Conjunctivae and EOM are normal. Pupils are equal, round, and reactive to light Right Ear: External ear normal without discharge Left Ear: External ear normal without discharge Nose: Nose without discharge or deformity Mouth/Throat: Oropharynx is without other ulcerations and moist  Neck: Normal range of motion. Neck supple. No JVD present. No tracheal deviation present or significant neck LA or mass Cardiovascular: Normal rate, regular rhythm, normal heart sounds and intact distal pulses.   Pulmonary/Chest: WOB normal and breath sounds without rales or wheezing  Abdominal: Soft. Bowel sounds are normal. NT. No HSM  Musculoskeletal: Normal range of motion. Exhibits no edema Lymphadenopathy: Has no other cervical adenopathy.  Neurological: Pt is alert and oriented to person, place, and time. Pt has normal reflexes. No cranial  nerve deficit. Motor grossly intact, Gait intact Skin: Skin is warm and dry. No rash noted or new ulcerations Psychiatric:  Has normal mood and affect. Behavior is normal without agitation No other exam findings    Assessment & Plan:

## 2018-01-30 NOTE — Assessment & Plan Note (Signed)
For prolia today, to f/u any worsening symptoms or concerns 

## 2018-01-30 NOTE — Assessment & Plan Note (Signed)
stable overall by history and exam, recent data reviewed with pt, and pt to continue medical treatment as before,  to f/u any worsening symptoms or concerns BP Readings from Last 3 Encounters:  01/30/18 124/78  10/22/17 (!) 146/105  08/01/17 138/84

## 2018-01-31 ENCOUNTER — Encounter: Payer: Self-pay | Admitting: Internal Medicine

## 2018-01-31 ENCOUNTER — Telehealth: Payer: Self-pay

## 2018-01-31 NOTE — Telephone Encounter (Signed)
-----   Message from Biagio Borg, MD sent at 01/31/2018 11:26 AM EDT ----- Left message on MyChart, pt to cont same tx except  The test results show that your current treatment is OK, except the Vitamin D level is low.  Please start Vit D OTC 2000 units per day (indefinitely) as this helps with bone health.    Manpreet Strey to please inform pt

## 2018-01-31 NOTE — Telephone Encounter (Signed)
Called pt, LVM.   CRM created.  

## 2018-02-01 NOTE — Telephone Encounter (Signed)
Almyra Free to see mychart message, as I am no longer able to provide free care by email

## 2018-02-05 ENCOUNTER — Other Ambulatory Visit: Payer: Self-pay | Admitting: Internal Medicine

## 2018-02-05 NOTE — Telephone Encounter (Signed)
Done erx 

## 2018-02-25 DIAGNOSIS — M5136 Other intervertebral disc degeneration, lumbar region: Secondary | ICD-10-CM | POA: Diagnosis not present

## 2018-03-23 ENCOUNTER — Other Ambulatory Visit: Payer: Self-pay | Admitting: Internal Medicine

## 2018-03-28 NOTE — Progress Notes (Addendum)
Subjective:   Alyssa Fernandez is a 77 y.o. female who presents for an Initial Medicare Annual Wellness Visit.  Review of Systems    No ROS.  Medicare Wellness Visit. Additional risk factors are reflected in the social history. Cardiac Risk Factors include: advanced age (>81men, >62 women);dyslipidemia;hypertension Sleep patterns: has interrupted sleep, gets up 1-2 times nightly to void and sleeps 5-6 hours nightly.    Home Safety/Smoke Alarms: Feels safe in home. Smoke alarms in place.  Living environment; residence and Firearm Safety: 2-story house, no firearms. Lives with husband, no needs for DME, good support system Seat Belt Safety/Bike Helmet: Wears seat belt.     Objective:    Today's Vitals   03/29/18 1333  BP: (!) 145/76  Pulse: 61  Resp: 18  SpO2: 98%  Weight: 143 lb (64.9 kg)  Height: 5\' 3"  (1.6 m)   Body mass index is 25.33 kg/m.  Advanced Directives 03/29/2018 04/13/2017 05/06/2012  Does Patient Have a Medical Advance Directive? Yes No Patient has advance directive, copy not in chart  Type of Advance Directive [place];Living will - [place];Living will  Copy of [place] in Chart? No - copy requested - Copy requested from family  Would patient like information on creating a medical advance directive? - No - Patient declined -    Current Medications (verified) Outpatient Encounter Medications as of 03/29/2018  Medication Sig  . amLODipine (NORVASC) 5 MG tablet TAKE ONE TABLET EACH DAY  . amphetamine-dextroamphetamine (ADDERALL) 10 MG tablet Take 1 tablet (10 mg total) by mouth daily as needed (to stay awake).  Marland Kitchen HYDROcodone-acetaminophen (NORCO/VICODIN) 5-325 MG tablet Take 1 tablet by mouth 2 (two) times daily as needed for moderate pain.  . Hypromellose (ARTIFICIAL TEARS OP) Place 1 drop into both eyes daily as needed (dry eyes).  . labetalol (NORMODYNE) 300 MG tablet TAKE ONE TABLET TWICE DAILY  .  pantoprazole (PROTONIX) 40 MG tablet TAKE ONE TABLET BY MOUTH ONCE DAILY  . potassium chloride SA (K-DUR,KLOR-CON) 20 MEQ tablet TAKE ONE TABLET TWICE DAILY  . PREMARIN 0.625 MG tablet TAKE ONE TABLET EACH DAY FOR 21 DAYS THEN DO NOT TAKE FOR 7 DAYS  . rosuvastatin (CRESTOR) 20 MG tablet TAKE ONE TABLET EACH DAY  . sertraline (ZOLOFT) 100 MG tablet TAKE ONE TABLET TWICE DAILY  . telmisartan (MICARDIS) 40 MG tablet TAKE ONE TABLET EACH DAY  . [DISCONTINUED] ALPRAZolam (XANAX) 0.5 MG tablet TAKE ONE TABLET AT BEDTIME AS NEEDED FORANXIETY  . [DISCONTINUED] benzonatate (TESSALON) 100 MG capsule Take 1 capsule (100 mg total) by mouth every 8 (eight) hours. (Patient not taking: Reported on 03/29/2018)   No facility-administered encounter medications on file as of 03/29/2018.     Allergies (verified) Codeine; Sulfonamide derivatives; and Zocor [simvastatin]   History: Past Medical History:  Diagnosis Date  . Anxiety   . Atherosclerosis of aorta ([place]) 08/01/2017  . 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 GERD   . Hyperlipidemia   . Hypertension   . HYPOTHYROIDISM   . INSOMNIA   . 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 OSTEOPENIA   . Polycystic kidney disease   . RENAL FAILURE   . VITAMIN D DEFICIENCY    Past Surgical History:  Procedure Laterality Date  . ABDOMINAL HYSTERECTOMY    . 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 bladder operation, but per sling procedure    .  BUNIONECTOMY    . CERVICAL DISC SURGERY    . fx rt ankle    . left temporal bx artery-negative 2     . PARTIAL NEPHRECTOMY    . percutaneous drainage of liver abscess  05/06/2012   Family History  Problem Relation Age of Onset  . Stroke Mother    Social History   Socioeconomic History  . Marital status: Married    Spouse name: Not on file  . Number of children: 1  . Years of education: Not on file  . Highest education level: Not on file  Occupational History  . Not on file  Social  Needs  . Financial resource strain: Not hard at all  . Food insecurity:    Worry: Never true    Inability: Never true  . Transportation needs:    Medical: No    Non-medical: No  Tobacco Use  . Smoking status: Never Smoker  . Smokeless tobacco: Never Used  Substance and Sexual Activity  . Alcohol use: No  . Drug use: No  . Sexual activity: Not Currently    Birth control/protection: Post-menopausal  Lifestyle  . Physical activity:    Days per week: 2 days    Minutes per session: 50 min  . Stress: Rather much  Relationships  . Social connections:    Talks on phone: More than three times a week    Gets together: More than three times a week    Attends religious service: Not on file    Active member of club or organization: Not on file    Attends meetings of clubs or organizations: Not on file    Relationship status: Married  Other Topics Concern  . Not on file  Social History Narrative  . Not on file    Tobacco Counseling Counseling given: Not Answered  Activities of Daily Living In your present state of health, do you have any difficulty performing the following activities: 03/29/2018 04/13/2017  Hearing? N N  Vision? N N  Difficulty concentrating or making decisions? N N  Walking or climbing stairs? N N  Dressing or bathing? N N  Doing errands, shopping? N N  Preparing Food and eating ? N -  Using the Toilet? N -  In the past six months, have you accidently leaked urine? Y -  Comment history stress incontinence -  Do you have problems with loss of bowel control? N -  Managing your Medications? N -  Managing your Finances? N -  Housekeeping or managing your Housekeeping? N -  Some recent data might be hidden     Immunizations and Health Maintenance Immunization History  Administered Date(s) Administered  . Influenza Split 06/02/2011, 05/21/2012  . Influenza Whole 06/12/2007  . Influenza, High Dose Seasonal PF 04/20/2017  . Influenza,inj,Quad PF,6+ Mos  05/22/2013, 04/08/2014, 06/03/2015, 05/04/2016  . Pneumococcal Conjugate-13 05/22/2013  . Pneumococcal Polysaccharide-23 06/12/2007  . Td 10/16/2006  . Tdap 04/20/2017  . Zoster 10/16/2006   Health Maintenance Due  Topic Date Due  . INFLUENZA VACCINE  03/21/2018    Patient Care Team: Biagio Borg, MD as PCP - General (Internal Medicine)  Indicate any recent Medical Services you may have received from other than [facility] providers in the past year (date may be approximate).     Assessment:   This is a routine wellness examination for Aalyssa. Physical assessment deferred to PCP.   Hearing/Vision screen Hearing Screening Comments: Able to hear conversational tones w/o difficulty. No issues reported.  Passed whisper test Vision Screening Comments: appointment yearly Dr. Augusto Gamble  Dietary issues and exercise activities discussed: Current Exercise Habits: Home exercise routine, Type of exercise: walking;stretching, Time (Minutes): 45, Frequency (Times/Week): 3, Weekly Exercise (Minutes/Week): 135, Intensity: Mild, Exercise limited by: None identified  Diet (meal preparation, eat out, water intake, caffeinated beverages, dairy products, fruits and vegetables): in general, a "healthy" diet  , well balanced   Reviewed heart healthy diet. Encouraged patient to increase daily water and healthy fluid intake.  Goals    . Patient Stated     Take as much time as possible for myself while continuing to take care of my husband.      Depression Screen PHQ 2/9 Scores 03/29/2018 01/30/2018 08/01/2017 05/04/2016 06/03/2015 05/26/2014 07/17/2012  PHQ - 2 Score 1 1 0 1 0 0 0  PHQ- 9 Score 4 - - - - - -    Fall Risk Fall Risk  03/29/2018 01/30/2018 08/01/2017 05/04/2016 06/03/2015  Falls in the past year? No No No Yes No   Cognitive Function: MMSE - Mini Mental State Exam 03/29/2018  Orientation to time 5  Orientation to Place 5  Registration 3  Attention/ Calculation 4  Recall 1  Language- name 2  objects 2  Language- repeat 1  Language- follow 3 step command 3  Language- read & follow direction 1  Write a sentence 1  Copy design 1  Total score 27        Screening Tests Health Maintenance  Topic Date Due  . INFLUENZA VACCINE  03/21/2018  . TETANUS/TDAP  04/21/2027  . DEXA SCAN  Completed  . PNA vac Low Risk Adult  Completed      Plan:     Continue doing brain stimulating activities (puzzles, reading, adult coloring books, staying active) to keep memory sharp.   Continue to eat heart healthy diet (full of fruits, vegetables, whole grains, lean protein, water--limit salt, fat, and sugar intake) and increase physical activity as tolerated.  I have personally reviewed and noted the following in the patient's chart:   . Medical and social history . Use of alcohol, tobacco or illicit drugs  . Current medications and supplements . Functional ability and status . Nutritional status . Physical activity . Advanced directives . List of other physicians . Vitals . Screenings to include cognitive, depression, and falls . Referrals and appointments  In addition, I have reviewed and discussed with patient certain preventive protocols, quality metrics, and best practice recommendations. A written personalized care plan for preventive services as well as general preventive health recommendations were provided to patient.     Michiel Cowboy, RN   03/29/2018     Medical screening examination/treatment/procedure(s) were performed by non-physician practitioner and as supervising physician I was immediately available for consultation/collaboration. I agree with above. Cathlean Cower, MD

## 2018-03-29 ENCOUNTER — Ambulatory Visit (INDEPENDENT_AMBULATORY_CARE_PROVIDER_SITE_OTHER): Payer: PPO | Admitting: *Deleted

## 2018-03-29 ENCOUNTER — Telehealth: Payer: Self-pay | Admitting: *Deleted

## 2018-03-29 VITALS — BP 145/76 | HR 61 | Resp 18 | Ht 63.0 in | Wt 143.0 lb

## 2018-03-29 DIAGNOSIS — Z0000 Encounter for general adult medical examination without abnormal findings: Secondary | ICD-10-CM | POA: Diagnosis not present

## 2018-03-29 MED ORDER — ALPRAZOLAM 0.5 MG PO TABS: ORAL_TABLET | ORAL | 1 refills | Status: DC

## 2018-03-29 NOTE — Telephone Encounter (Signed)
Done erx 

## 2018-03-29 NOTE — Telephone Encounter (Signed)
During AWV, patient stated she needed a refill for xanax.

## 2018-03-29 NOTE — Patient Instructions (Addendum)
Continue doing brain stimulating activities (puzzles, reading, adult coloring books, staying active) to keep memory sharp.   Continue to eat heart healthy diet (full of fruits, vegetables, whole grains, lean protein, water--limit salt, fat, and sugar intake) and increase physical activity as tolerated.   Alyssa Fernandez , Thank you for taking time to come for your Medicare Wellness Visit. I appreciate your ongoing commitment to your health goals. Please review the following plan we discussed and let me know if I can assist you in the future.   These are the goals we discussed: Goals    . Patient Stated     Take as much time as possible for myself while continuing to take care of my husband.       This is a list of the screening recommended for you and due dates:  Health Maintenance  Topic Date Due  . Flu Shot  03/21/2018  . Tetanus Vaccine  04/21/2027  . DEXA scan (bone density measurement)  Completed  . Pneumonia vaccines  Completed     Health Maintenance, Female Adopting a healthy lifestyle and getting preventive care can go a long way to promote health and wellness. Talk with your health care provider about what schedule of regular examinations is right for you. This is a good chance for you to check in with your provider about disease prevention and staying healthy. In between checkups, there are plenty of things you can do on your own. Experts have done a lot of research about which lifestyle changes and preventive measures are most likely to keep you healthy. Ask your health care provider for more information. Weight and diet Eat a healthy diet  Be sure to include plenty of vegetables, fruits, low-fat dairy products, and lean protein.  Do not eat a lot of foods high in solid fats, added sugars, or salt.  Get regular exercise. This is one of the most important things you can do for your health. ? Most adults should exercise for at least 150 minutes each week. The exercise should  increase your heart rate and make you sweat (moderate-intensity exercise). ? Most adults should also do strengthening exercises at least twice a week. This is in addition to the moderate-intensity exercise.  Maintain a healthy weight  Body mass index (BMI) is a measurement that can be used to identify possible weight problems. It estimates body fat based on height and weight. Your health care provider can help determine your BMI and help you achieve or maintain a healthy weight.  For females 8 years of age and older: ? A BMI below 18.5 is considered underweight. ? A BMI of 18.5 to 24.9 is normal. ? A BMI of 25 to 29.9 is considered overweight. ? A BMI of 30 and above is considered obese.  Watch levels of cholesterol and blood lipids  You should start having your blood tested for lipids and cholesterol at 79 years of age, then have this test every 5 years.  You may need to have your cholesterol levels checked more often if: ? Your lipid or cholesterol levels are high. ? You are older than 79 years of age. ? You are at high risk for heart disease.  Cancer screening Lung Cancer  Lung cancer screening is recommended for adults 65-50 years old who are at high risk for lung cancer because of a history of smoking.  A yearly low-dose CT scan of the lungs is recommended for people who: ? Currently smoke. ? Have quit within  the past 15 years. ? Have at least a 30-pack-year history of smoking. A pack year is smoking an average of one pack of cigarettes a day for 1 year.  Yearly screening should continue until it has been 15 years since you quit.  Yearly screening should stop if you develop a health problem that would prevent you from having lung cancer treatment.  Breast Cancer  Practice breast self-awareness. This means understanding how your breasts normally appear and feel.  It also means doing regular breast self-exams. Let your health care provider know about any changes, no matter  how small.  If you are in your 20s or 30s, you should have a clinical breast exam (CBE) by a health care provider every 1-3 years as part of a regular health exam.  If you are 5 or older, have a CBE every year. Also consider having a breast X-ray (mammogram) every year.  If you have a family history of breast cancer, talk to your health care provider about genetic screening.  If you are at high risk for breast cancer, talk to your health care provider about having an MRI and a mammogram every year.  Breast cancer gene (BRCA) assessment is recommended for women who have family members with BRCA-related cancers. BRCA-related cancers include: ? Breast. ? Ovarian. ? Tubal. ? Peritoneal cancers.  Results of the assessment will determine the need for genetic counseling and BRCA1 and BRCA2 testing.  Cervical Cancer Your health care provider may recommend that you be screened regularly for cancer of the pelvic organs (ovaries, uterus, and vagina). This screening involves a pelvic examination, including checking for microscopic changes to the surface of your cervix (Pap test). You may be encouraged to have this screening done every 3 years, beginning at age 94.  For women ages 55-65, health care providers may recommend pelvic exams and Pap testing every 3 years, or they may recommend the Pap and pelvic exam, combined with testing for human papilloma virus (HPV), every 5 years. Some types of HPV increase your risk of cervical cancer. Testing for HPV may also be done on women of any age with unclear Pap test results.  Other health care providers may not recommend any screening for nonpregnant women who are considered low risk for pelvic cancer and who do not have symptoms. Ask your health care provider if a screening pelvic exam is right for you.  If you have had past treatment for cervical cancer or a condition that could lead to cancer, you need Pap tests and screening for cancer for at least 20  years after your treatment. If Pap tests have been discontinued, your risk factors (such as having a new sexual partner) need to be reassessed to determine if screening should resume. Some women have medical problems that increase the chance of getting cervical cancer. In these cases, your health care provider may recommend more frequent screening and Pap tests.  Colorectal Cancer  This type of cancer can be detected and often prevented.  Routine colorectal cancer screening usually begins at 79 years of age and continues through 79 years of age.  Your health care provider may recommend screening at an earlier age if you have risk factors for colon cancer.  Your health care provider may also recommend using home test kits to check for hidden blood in the stool.  A small camera at the end of a tube can be used to examine your colon directly (sigmoidoscopy or colonoscopy). This is done to check for  the earliest forms of colorectal cancer.  Routine screening usually begins at age 38.  Direct examination of the colon should be repeated every 5-10 years through 79 years of age. However, you may need to be screened more often if early forms of precancerous polyps or small growths are found.  Skin Cancer  Check your skin from head to toe regularly.  Tell your health care provider about any new moles or changes in moles, especially if there is a change in a mole's shape or color.  Also tell your health care provider if you have a mole that is larger than the size of a pencil eraser.  Always use sunscreen. Apply sunscreen liberally and repeatedly throughout the day.  Protect yourself by wearing long sleeves, pants, a wide-brimmed hat, and sunglasses whenever you are outside.  Heart disease, diabetes, and high blood pressure  High blood pressure causes heart disease and increases the risk of stroke. High blood pressure is more likely to develop in: ? People who have blood pressure in the high  end of the normal range (130-139/85-89 mm Hg). ? People who are overweight or obese. ? People who are African American.  If you are 58-57 years of age, have your blood pressure checked every 3-5 years. If you are 76 years of age or older, have your blood pressure checked every year. You should have your blood pressure measured twice-once when you are at a hospital or clinic, and once when you are not at a hospital or clinic. Record the average of the two measurements. To check your blood pressure when you are not at a hospital or clinic, you can use: ? An automated blood pressure machine at a pharmacy. ? A home blood pressure monitor.  If you are between 58 years and 99 years old, ask your health care provider if you should take aspirin to prevent strokes.  Have regular diabetes screenings. This involves taking a blood sample to check your fasting blood sugar level. ? If you are at a normal weight and have a low risk for diabetes, have this test once every three years after 79 years of age. ? If you are overweight and have a high risk for diabetes, consider being tested at a younger age or more often. Preventing infection Hepatitis B  If you have a higher risk for hepatitis B, you should be screened for this virus. You are considered at high risk for hepatitis B if: ? You were born in a country where hepatitis B is common. Ask your health care provider which countries are considered high risk. ? Your parents were born in a high-risk country, and you have not been immunized against hepatitis B (hepatitis B vaccine). ? You have HIV or AIDS. ? You use needles to inject street drugs. ? You live with someone who has hepatitis B. ? You have had sex with someone who has hepatitis B. ? You get hemodialysis treatment. ? You take certain medicines for conditions, including cancer, organ transplantation, and autoimmune conditions.  Hepatitis C  Blood testing is recommended for: ? Everyone born from  [birth year] through 1967. ? Anyone with known risk factors for hepatitis C.  Sexually transmitted infections (STIs)  You should be screened for sexually transmitted infections (STIs) including gonorrhea and chlamydia if: ? You are sexually active and are younger than 79 years of age. ? You are older than 79 years of age and your health care provider tells you that you are at risk for this  type of infection. ? Your sexual activity has changed since you were last screened and you are at an increased risk for chlamydia or gonorrhea. Ask your health care provider if you are at risk.  If you do not have HIV, but are at risk, it may be recommended that you take a prescription medicine daily to prevent HIV infection. This is called pre-exposure prophylaxis (PrEP). You are considered at risk if: ? You are sexually active and do not regularly use condoms or know the HIV status of your partner(s). ? You take drugs by injection. ? You are sexually active with a partner who has HIV.  Talk with your health care provider about whether you are at high risk of being infected with HIV. If you choose to begin PrEP, you should first be tested for HIV. You should then be tested every 3 months for as long as you are taking PrEP. Pregnancy  If you are premenopausal and you may become pregnant, ask your health care provider about preconception counseling.  If you may become pregnant, take 400 to 800 micrograms (mcg) of folic acid every day.  If you want to prevent pregnancy, talk to your health care provider about birth control (contraception). Osteoporosis and menopause  Osteoporosis is a disease in which the bones lose minerals and strength with aging. This can result in serious bone fractures. Your risk for osteoporosis can be identified using a bone density scan.  If you are 70 years of age or older, or if you are at risk for osteoporosis and fractures, ask your health care provider if you should be  screened.  Ask your health care provider whether you should take a calcium or vitamin D supplement to lower your risk for osteoporosis.  Menopause may have certain physical symptoms and risks.  Hormone replacement therapy may reduce some of these symptoms and risks. Talk to your health care provider about whether hormone replacement therapy is right for you. Follow these instructions at home:  Schedule regular health, dental, and eye exams.  Stay current with your immunizations.  Do not use any tobacco products including cigarettes, chewing tobacco, or electronic cigarettes.  If you are pregnant, do not drink alcohol.  If you are breastfeeding, limit how much and how often you drink alcohol.  Limit alcohol intake to no more than 1 drink per day for nonpregnant women. One drink equals 12 ounces of beer, 5 ounces of Alyssa Fernandez, or 1 ounces of hard liquor.  Do not use street drugs.  Do not share needles.  Ask your health care provider for help if you need support or information about quitting drugs.  Tell your health care provider if you often feel depressed.  Tell your health care provider if you have ever been abused or do not feel safe at home. This information is not intended to replace advice given to you by your health care provider. Make sure you discuss any questions you have with your health care provider. Document Released: 02/20/2011 Document Revised: 01/13/2016 Document Reviewed: 05/11/2015 Elsevier Interactive Patient Education  [employer].

## 2018-04-01 ENCOUNTER — Other Ambulatory Visit: Payer: Self-pay | Admitting: Internal Medicine

## 2018-05-03 ENCOUNTER — Telehealth: Payer: Self-pay | Admitting: Internal Medicine

## 2018-05-03 NOTE — Telephone Encounter (Signed)
Insurance has been submitted and verified for Prolia. Patient is responsible for a $253 copay. Due on or after 08/02/2018. Left message for patient to call back to schedule.  Okay to schedule... Visit Note: Prolia ($253 copay - okay to give per Gareth Eagle) Visit Type: Nurse Provider: Nurse

## 2018-05-05 ENCOUNTER — Encounter: Payer: Self-pay | Admitting: Internal Medicine

## 2018-05-05 DIAGNOSIS — M810 Age-related osteoporosis without current pathological fracture: Secondary | ICD-10-CM

## 2018-05-06 NOTE — Telephone Encounter (Signed)
Staff to assist pt with scheduling the DXA please

## 2018-05-09 ENCOUNTER — Encounter: Payer: Self-pay | Admitting: Internal Medicine

## 2018-05-15 ENCOUNTER — Encounter: Payer: Self-pay | Admitting: Internal Medicine

## 2018-05-16 ENCOUNTER — Encounter: Payer: Self-pay | Admitting: Internal Medicine

## 2018-05-28 ENCOUNTER — Encounter: Payer: Self-pay | Admitting: Internal Medicine

## 2018-06-17 ENCOUNTER — Other Ambulatory Visit: Payer: Self-pay | Admitting: Internal Medicine

## 2018-07-17 ENCOUNTER — Other Ambulatory Visit: Payer: Self-pay | Admitting: Internal Medicine

## 2018-07-30 ENCOUNTER — Ambulatory Visit
Admission: RE | Admit: 2018-07-30 | Discharge: 2018-07-30 | Disposition: A | Discharge status: Home / Self Care | Payer: PPO | Source: Ambulatory Visit | Attending: Internal Medicine | Admitting: Internal Medicine

## 2018-07-30 DIAGNOSIS — M810 Age-related osteoporosis without current pathological fracture: Secondary | ICD-10-CM

## 2018-08-05 ENCOUNTER — Ambulatory Visit (INDEPENDENT_AMBULATORY_CARE_PROVIDER_SITE_OTHER): Payer: PPO

## 2018-08-05 ENCOUNTER — Ambulatory Visit: Admission: RE | Admit: 2018-08-05 | Payer: PPO | Source: Ambulatory Visit

## 2018-08-05 DIAGNOSIS — M810 Age-related osteoporosis without current pathological fracture: Secondary | ICD-10-CM

## 2018-08-05 MED ORDER — DENOSUMAB 60 MG/ML ~~LOC~~ SOSY
60.0000 mg | PREFILLED_SYRINGE | Freq: Once | SUBCUTANEOUS | Status: AC
  Administered 2018-08-05: 60 mg via SUBCUTANEOUS

## 2018-08-15 DIAGNOSIS — N3021 Other chronic cystitis with hematuria: Secondary | ICD-10-CM | POA: Diagnosis not present

## 2018-08-15 DIAGNOSIS — N393 Stress incontinence (female) (male): Secondary | ICD-10-CM | POA: Diagnosis not present

## 2018-08-28 ENCOUNTER — Encounter: Payer: Self-pay | Admitting: Internal Medicine

## 2018-08-29 NOTE — Telephone Encounter (Signed)
Staff to assist pt with making ROV appt please

## 2018-08-30 ENCOUNTER — Other Ambulatory Visit: Payer: Self-pay | Admitting: Internal Medicine

## 2018-08-30 NOTE — Telephone Encounter (Signed)
Done erx 

## 2018-09-16 ENCOUNTER — Other Ambulatory Visit: Payer: Self-pay | Admitting: Internal Medicine

## 2018-09-25 ENCOUNTER — Other Ambulatory Visit: Payer: Self-pay | Admitting: Internal Medicine

## 2018-09-25 DIAGNOSIS — M5136 Other intervertebral disc degeneration, lumbar region: Secondary | ICD-10-CM | POA: Diagnosis not present

## 2018-09-25 DIAGNOSIS — M542 Cervicalgia: Secondary | ICD-10-CM | POA: Diagnosis not present

## 2018-09-25 NOTE — Telephone Encounter (Signed)
Done erx 

## 2018-10-09 DIAGNOSIS — M5136 Other intervertebral disc degeneration, lumbar region: Secondary | ICD-10-CM | POA: Diagnosis not present

## 2018-10-09 DIAGNOSIS — M5412 Radiculopathy, cervical region: Secondary | ICD-10-CM | POA: Diagnosis not present

## 2018-10-14 ENCOUNTER — Other Ambulatory Visit: Payer: Self-pay | Admitting: Internal Medicine

## 2018-10-15 ENCOUNTER — Other Ambulatory Visit (INDEPENDENT_AMBULATORY_CARE_PROVIDER_SITE_OTHER): Payer: PPO

## 2018-10-15 ENCOUNTER — Ambulatory Visit (INDEPENDENT_AMBULATORY_CARE_PROVIDER_SITE_OTHER): Payer: PPO | Admitting: Internal Medicine

## 2018-10-15 ENCOUNTER — Encounter: Payer: Self-pay | Admitting: Internal Medicine

## 2018-10-15 VITALS — BP 140/88 | HR 81 | Temp 98.1°F | Ht 63.0 in | Wt 143.0 lb

## 2018-10-15 DIAGNOSIS — Z0000 Encounter for general adult medical examination without abnormal findings: Secondary | ICD-10-CM | POA: Diagnosis not present

## 2018-10-15 DIAGNOSIS — E559 Vitamin D deficiency, unspecified: Secondary | ICD-10-CM

## 2018-10-15 LAB — CBC WITH DIFFERENTIAL/PLATELET
Basophils Absolute: 0 10*3/uL (ref 0.0–0.1)
Basophils Relative: 0.8 % (ref 0.0–3.0)
EOS ABS: 0.2 10*3/uL (ref 0.0–0.7)
Eosinophils Relative: 3.6 % (ref 0.0–5.0)
HCT: 41.3 % (ref 36.0–46.0)
Hemoglobin: 13.8 g/dL (ref 12.0–15.0)
Lymphocytes Relative: 15.7 % (ref 12.0–46.0)
Lymphs Abs: 0.9 10*3/uL (ref 0.7–4.0)
MCHC: 33.5 g/dL (ref 30.0–36.0)
MCV: 89.4 fl (ref 78.0–100.0)
MONO ABS: 0.4 10*3/uL (ref 0.1–1.0)
Monocytes Relative: 7.6 % (ref 3.0–12.0)
Neutro Abs: 4 10*3/uL (ref 1.4–7.7)
Neutrophils Relative %: 72.3 % (ref 43.0–77.0)
Platelets: 190 10*3/uL (ref 150.0–400.0)
RBC: 4.61 Mil/uL (ref 3.87–5.11)
RDW: 13.9 % (ref 11.5–15.5)
WBC: 5.5 10*3/uL (ref 4.0–10.5)

## 2018-10-15 LAB — LIPID PANEL
CHOL/HDL RATIO: 3
Cholesterol: 162 mg/dL (ref 0–200)
HDL: 63.8 mg/dL (ref >39.00)
LDL CALC: 76 mg/dL (ref 0–99)
NonHDL: 98.46
Triglycerides: 112 mg/dL (ref 0.0–149.0)
VLDL: 22.4 mg/dL (ref 0.0–40.0)

## 2018-10-15 LAB — HEPATIC FUNCTION PANEL
ALK PHOS: 42 U/L (ref 39–117)
ALT: 10 U/L (ref 0–35)
AST: 15 U/L (ref 0–37)
Albumin: 4.2 g/dL (ref 3.5–5.2)
Bilirubin, Direct: 0.1 mg/dL (ref 0.0–0.3)
Total Bilirubin: 0.6 mg/dL (ref 0.2–1.2)
Total Protein: 6.4 g/dL (ref 6.0–8.3)

## 2018-10-15 LAB — URINALYSIS, ROUTINE W REFLEX MICROSCOPIC
BILIRUBIN URINE: NEGATIVE
Hgb urine dipstick: NEGATIVE
Ketones, ur: NEGATIVE
LEUKOCYTE UA: NEGATIVE
Nitrite: NEGATIVE
Specific Gravity, Urine: 1.02 (ref 1.000–1.030)
Total Protein, Urine: NEGATIVE
Urine Glucose: NEGATIVE
Urobilinogen, UA: 0.2 (ref 0.0–1.0)
pH: 6.5 (ref 5.0–8.0)

## 2018-10-15 LAB — BASIC METABOLIC PANEL
BUN: 21 mg/dL (ref 6–23)
CO2: 29 meq/L (ref 19–32)
CREATININE: 1.08 mg/dL (ref 0.40–1.20)
Calcium: 8.6 mg/dL (ref 8.4–10.5)
Chloride: 102 mEq/L (ref 96–112)
GFR: 48.87 mL/min — ABNORMAL LOW (ref >60.00)
Glucose, Bld: 88 mg/dL (ref 70–99)
Potassium: 3.7 mEq/L (ref 3.5–5.1)
Sodium: 140 mEq/L (ref 135–145)

## 2018-10-15 LAB — TSH: TSH: 3.14 u[IU]/mL (ref 0.35–4.50)

## 2018-10-15 LAB — VITAMIN D 25 HYDROXY (VIT D DEFICIENCY, FRACTURES): VITD: 30.79 ng/mL (ref 30.00–100.00)

## 2018-10-15 MED ORDER — SERTRALINE HCL 100 MG PO TABS: 100.0000 mg | ORAL_TABLET | Freq: Two times a day (BID) | ORAL | 3 refills | Status: DC

## 2018-10-15 MED ORDER — HYDROCODONE-ACETAMINOPHEN 5-325 MG PO TABS: 1.0000 | ORAL_TABLET | Freq: Four times a day (QID) | ORAL | 0 refills | Status: DC | PRN

## 2018-10-15 NOTE — Patient Instructions (Signed)
Please continue all other medications as before, and refills have been done if requested - the pain medication  Please have the pharmacy call with any other refills you may need.  Please continue your efforts at being more active, low cholesterol diet, and weight control.  You are otherwise up to date with prevention measures today.  Please keep your appointments with your specialists as you may have planned  Please go to the LAB in the Basement (turn left off the elevator) for the tests to be done today  You will be contacted by phone if any changes need to be made immediately.  Otherwise, you will receive a letter about your results with an explanation, but please check with MyChart first.  Please remember to sign up for MyChart if you have not done so, as this will be important to you in the future with finding out test results, communicating by private email, and scheduling acute appointments online when needed.  Please return in 6 months, or sooner if needed

## 2018-10-15 NOTE — Assessment & Plan Note (Signed)
For f/u lab 

## 2018-10-15 NOTE — Assessment & Plan Note (Signed)

## 2018-10-15 NOTE — Progress Notes (Signed)
Subjective:    Patient ID: Alyssa Fernandez, female    DOB: 07-Apr-1941, 78 y.o.   MRN: [id number]  HPI   Here for wellness and f/u;  Overall doing ok;  Pt denies Chest pain, worsening SOB, DOE, wheezing, orthopnea, PND, worsening LE edema, palpitations, dizziness or syncope.  Pt denies neurological change such as new headache, facial or extremity weakness.  Pt denies polydipsia, polyuria, or low sugar symptoms. Pt states overall good compliance with treatment and medications, good tolerability, and has been trying to follow appropriate diet.  No fever, night sweats, wt loss, loss of appetite, or other constitutional symptoms.  Pt states good ability with ADL's, has low fall risk, home safety reviewed and adequate, no other significant changes in hearing or vision, and only occasionally active with exercise. + depressed with taking care of her husband, but Denies suicidal ideation, or panic; has ongoing anxiety.   Pt continues to have recurring LBP without change in severity, bowel or bladder change, fever, wt loss,  worsening LE pain/numbness/weakness, gait change or falls, but needs 30 norco to last her another year again.  Has f/u with ortho Dr Nelva Bush soon, no scheduled appt right now, does not abuse.   Past Medical History:  Diagnosis Date  . Anxiety   . Atherosclerosis of aorta ([place]) 08/01/2017  . 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 GERD   . Hyperlipidemia   . Hypertension   . HYPOTHYROIDISM   . INSOMNIA   . 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 OSTEOPENIA   . Polycystic kidney disease   . RENAL FAILURE   . VITAMIN D DEFICIENCY    Past Surgical History:  Procedure Laterality Date  . ABDOMINAL HYSTERECTOMY    . 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 bladder operation, but per sling procedure    . BUNIONECTOMY    . CERVICAL DISC SURGERY    . fx rt ankle    . left temporal bx artery-negative 2     . PARTIAL NEPHRECTOMY    . percutaneous drainage of liver  abscess  05/06/2012    reports that she has never smoked. She has never used smokeless tobacco. She reports that she does not drink alcohol or use drugs. family history includes Stroke in her mother. Allergies  Allergen Reactions  . Codeine Swelling  . Sulfonamide Derivatives Swelling  . Zocor [Simvastatin] Other (See Comments)    Cognitive decrease   Current Outpatient Medications on File Prior to Visit  Medication Sig Dispense Refill  . ALPRAZolam (XANAX) 0.5 MG tablet TAKE ONE TABLET AT BEDTIME AS NEEDED FORSTRESS OR SLEEP 90 tablet 1  . amLODipine (NORVASC) 5 MG tablet TAKE ONE TABLET EVERY DAY 90 tablet 3  . amphetamine-dextroamphetamine (ADDERALL) 10 MG tablet TAKE ONE TABLET TWICE DAILY 60 tablet 0  . Hypromellose (ARTIFICIAL TEARS OP) Place 1 drop into both eyes daily as needed (dry eyes).    . labetalol (NORMODYNE) 300 MG tablet TAKE ONE TABLET TWICE DAILY 180 tablet 1  . pantoprazole (PROTONIX) 40 MG tablet TAKE ONE TABLET BY MOUTH ONCE DAILY 90 tablet 3  . potassium chloride SA (K-DUR,KLOR-CON) 20 MEQ tablet TAKE ONE TABLET TWICE DAILY 60 tablet 11  . PREMARIN 0.625 MG tablet TAKE ONE TABLET EACH DAY FOR 21 DAYS THEN DO NOT TAKE FOR 7 DAYS 63 tablet 3  . rosuvastatin (CRESTOR) 20 MG tablet TAKE ONE TABLET EACH DAY 90 tablet 1  .  telmisartan (MICARDIS) 40 MG tablet TAKE ONE TABLET DAILY 90 tablet 1   No current facility-administered medications on file prior to visit.    Review of Systems Constitutional: Negative for other unusual diaphoresis, sweats, appetite or weight changes HENT: Negative for other worsening hearing loss, ear pain, facial swelling, mouth sores or neck stiffness.   Eyes: Negative for other worsening pain, redness or other visual disturbance.  Respiratory: Negative for other stridor or swelling Cardiovascular: Negative for other palpitations or other chest pain  Gastrointestinal: Negative for worsening diarrhea or loose stools, blood in stool, distention or  other pain Genitourinary: Negative for hematuria, flank pain or other change in urine volume.  Musculoskeletal: Negative for myalgias or other joint swelling.  Skin: Negative for other color change, or other wound or worsening drainage.  Neurological: Negative for other syncope or numbness. Hematological: Negative for other adenopathy or swelling Psychiatric/Behavioral: Negative for hallucinations, other worsening agitation, SI, self-injury, or new decreased concentration All other system neg per pt    Objective:   Physical Exam BP 140/88   Pulse 81   Temp 98.1 F (36.7 C) (Oral)   Ht 5\' 3"  (1.6 m)   Wt 143 lb (64.9 kg)   SpO2 90%   BMI 25.33 kg/m  VS noted,  Constitutional: Pt is oriented to person, place, and time. Appears well-developed and well-nourished, in no significant distress and comfortable Head: Normocephalic and atraumatic  Eyes: Conjunctivae and EOM are normal. Pupils are equal, round, and reactive to light Right Ear: External ear normal without discharge Left Ear: External ear normal without discharge Nose: Nose without discharge or deformity Mouth/Throat: Oropharynx is without other ulcerations and moist  Neck: Normal range of motion. Neck supple. No JVD present. No tracheal deviation present or significant neck LA or mass Cardiovascular: Normal rate, regular rhythm, normal heart sounds and intact distal pulses.   Pulmonary/Chest: WOB normal and breath sounds without rales or wheezing  Abdominal: Soft. Bowel sounds are normal. NT. No HSM  Musculoskeletal: Normal range of motion. Exhibits no edema Lymphadenopathy: Has no other cervical adenopathy.  Neurological: Pt is alert and oriented to person, place, and time. Pt has normal reflexes. No cranial nerve deficit. Motor grossly intact, Gait intact Skin: Skin is warm and dry. No rash noted or new ulcerations Psychiatric:  Has depressed mood and affect. Behavior is normal without agitation + depressed affect  Lab  Results  Component Value Date   WBC 5.7 01/30/2018   HGB 12.9 01/30/2018   HCT 37.8 01/30/2018   PLT 233.0 01/30/2018   GLUCOSE 100 (H) 01/30/2018   CHOL 143 01/30/2018   TRIG 182.0 (H) 01/30/2018   HDL 42.50 01/30/2018   LDLCALC 64 01/30/2018   ALT 12 01/30/2018   AST 16 01/30/2018   NA 140 01/30/2018   K 3.6 01/30/2018   CL 104 01/30/2018   CREATININE 1.08 01/30/2018   BUN 22 01/30/2018   CO2 27 01/30/2018   TSH 3.14 01/30/2018   INR 1.14 04/14/2017   HGBA1C 5.9 05/19/2008      Assessment & Plan:

## 2018-10-31 DIAGNOSIS — M5412 Radiculopathy, cervical region: Secondary | ICD-10-CM | POA: Diagnosis not present

## 2018-11-20 DIAGNOSIS — M5412 Radiculopathy, cervical region: Secondary | ICD-10-CM | POA: Diagnosis not present

## 2018-11-20 DIAGNOSIS — M13849 Other specified arthritis, unspecified hand: Secondary | ICD-10-CM | POA: Diagnosis not present

## 2018-11-20 DIAGNOSIS — M5030 Other cervical disc degeneration, unspecified cervical region: Secondary | ICD-10-CM | POA: Diagnosis not present

## 2018-11-20 DIAGNOSIS — Z1231 Encounter for screening mammogram for malignant neoplasm of breast: Secondary | ICD-10-CM | POA: Diagnosis not present

## 2018-11-20 DIAGNOSIS — M5136 Other intervertebral disc degeneration, lumbar region: Secondary | ICD-10-CM | POA: Diagnosis not present

## 2018-11-27 DIAGNOSIS — M79641 Pain in right hand: Secondary | ICD-10-CM | POA: Diagnosis not present

## 2018-11-27 DIAGNOSIS — K5669 Other partial intestinal obstruction: Secondary | ICD-10-CM | POA: Diagnosis not present

## 2018-12-23 ENCOUNTER — Other Ambulatory Visit: Payer: Self-pay | Admitting: Internal Medicine

## 2018-12-23 NOTE — Telephone Encounter (Signed)
Done erx 

## 2018-12-23 NOTE — Telephone Encounter (Signed)
[place] Controlled Database Checked Last filled: 08/30/18 # 60 LOV w/you: 10/15/18 Next appt w/you: None

## 2019-02-03 ENCOUNTER — Other Ambulatory Visit: Payer: Self-pay | Admitting: Internal Medicine

## 2019-02-03 NOTE — Telephone Encounter (Signed)
[place] Controlled Database Checked Last filled: 12/09/18 # 90 LOV w/you: 10/15/18 Next appt w/you: None

## 2019-02-03 NOTE — Telephone Encounter (Signed)
Xanax refill not needed as there is 1 refill left

## 2019-02-16 ENCOUNTER — Encounter: Payer: Self-pay | Admitting: Internal Medicine

## 2019-02-17 ENCOUNTER — Encounter: Payer: Self-pay | Admitting: Internal Medicine

## 2019-02-17 MED ORDER — ROSUVASTATIN CALCIUM 20 MG PO TABS: ORAL_TABLET | ORAL | 3 refills | Status: DC

## 2019-02-17 MED ORDER — TELMISARTAN 40 MG PO TABS: 40.0000 mg | ORAL_TABLET | Freq: Every day | ORAL | 3 refills | Status: DC

## 2019-02-17 MED ORDER — PANTOPRAZOLE SODIUM 40 MG PO TBEC: 40.0000 mg | DELAYED_RELEASE_TABLET | Freq: Every day | ORAL | 3 refills | Status: DC

## 2019-02-17 MED ORDER — AMLODIPINE BESYLATE 5 MG PO TABS: 5.0000 mg | ORAL_TABLET | Freq: Every day | ORAL | 3 refills | Status: DC

## 2019-02-17 MED ORDER — LABETALOL HCL 300 MG PO TABS: 300.0000 mg | ORAL_TABLET | Freq: Two times a day (BID) | ORAL | 3 refills | Status: DC

## 2019-04-01 ENCOUNTER — Ambulatory Visit (INDEPENDENT_AMBULATORY_CARE_PROVIDER_SITE_OTHER): Payer: PPO | Admitting: Internal Medicine

## 2019-04-01 ENCOUNTER — Other Ambulatory Visit: Payer: Self-pay

## 2019-04-01 DIAGNOSIS — F419 Anxiety disorder, unspecified: Secondary | ICD-10-CM | POA: Diagnosis not present

## 2019-04-01 DIAGNOSIS — I10 Essential (primary) hypertension: Secondary | ICD-10-CM | POA: Diagnosis not present

## 2019-04-01 DIAGNOSIS — E559 Vitamin D deficiency, unspecified: Secondary | ICD-10-CM | POA: Diagnosis not present

## 2019-04-01 DIAGNOSIS — R413 Other amnesia: Secondary | ICD-10-CM

## 2019-04-01 DIAGNOSIS — E611 Iron deficiency: Secondary | ICD-10-CM

## 2019-04-01 DIAGNOSIS — Z0000 Encounter for general adult medical examination without abnormal findings: Secondary | ICD-10-CM

## 2019-04-01 DIAGNOSIS — E538 Deficiency of other specified B group vitamins: Secondary | ICD-10-CM | POA: Diagnosis not present

## 2019-04-01 MED ORDER — TELMISARTAN 80 MG PO TABS: 80.0000 mg | ORAL_TABLET | Freq: Every day | ORAL | 3 refills | Status: DC

## 2019-04-01 MED ORDER — ALPRAZOLAM 0.5 MG PO TABS: ORAL_TABLET | ORAL | 5 refills | Status: DC

## 2019-04-01 NOTE — Patient Instructions (Signed)
Ok to increase the micardis (telmisartan) to 80 mg per day  OK to take the xanax at twice per day as needed  Please continue all other medications as before, and refills have been done if requested.  Please have the pharmacy call with any other refills you may need.  Please continue your efforts at being more active, low cholesterol diet, and weight control.  You are otherwise up to date with prevention measures today.  Please keep your appointments with your specialists as you may have planned  Please return as scheduled sept 17, with Lab testing done 3-5 days before

## 2019-04-01 NOTE — Progress Notes (Signed)
Subjective:    Patient ID: Alyssa Fernandez, female    DOB: [birth year], 78 y.o.   MRN: [id number]  HPI  Here to f/u; overall doing ok,  Pt denies chest pain, increasing sob or doe, wheezing, orthopnea, PND, increased LE swelling, palpitations, dizziness or syncope.  Pt denies new neurological symptoms such as new headache, or facial or extremity weakness or numbness.  Pt denies polydipsia, polyuria, or low sugar episode.  Pt states overall good compliance with meds, mostly trying to follow appropriate diet, with wt overall stable,  but little exercise however.  BP at home elevated for several months.    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 140/100.  Denies worsening depressive symptoms, suicidal ideation, or panic; has ongoing anxiety, much increased recently due to stressors related to the pandemic, and also increased forgetfulness and memory lapses in last 6 mo.   Past Medical History:  Diagnosis Date  . Anxiety   . Atherosclerosis of aorta ([place]) 08/01/2017  . 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 GERD   . Hyperlipidemia   . Hypertension   . HYPOTHYROIDISM   . INSOMNIA   . 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 OSTEOPENIA   . Polycystic kidney disease   . RENAL FAILURE   . VITAMIN D DEFICIENCY    Past Surgical History:  Procedure Laterality Date  . ABDOMINAL HYSTERECTOMY    . 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 bladder operation, but per sling procedure    . BUNIONECTOMY    . CERVICAL DISC SURGERY    . fx rt ankle    . left temporal bx artery-negative 2     . PARTIAL NEPHRECTOMY    . percutaneous drainage of liver abscess  05/06/2012    reports that she has never smoked. She has never used smokeless tobacco. She reports that she does not drink alcohol or use drugs. family history includes Stroke in her mother. Allergies  Allergen Reactions  . Codeine Swelling  . Sulfonamide Derivatives Swelling  . Zocor [Simvastatin] Other (See Comments)    Cognitive  decrease   Current Outpatient Medications on File Prior to Visit  Medication Sig Dispense Refill  . amLODipine (NORVASC) 5 MG tablet Take 1 tablet (5 mg total) by mouth daily. 90 tablet 3  . amphetamine-dextroamphetamine (ADDERALL) 10 MG tablet TAKE ONE TABLET TWICE DAILY 60 tablet 0  . HYDROcodone-acetaminophen (NORCO/VICODIN) 5-325 MG tablet Take 1 tablet by mouth every 6 (six) hours as needed for moderate pain. 30 tablet 0  . Hypromellose (ARTIFICIAL TEARS OP) Place 1 drop into both eyes daily as needed (dry eyes).    . labetalol (NORMODYNE) 300 MG tablet Take 1 tablet (300 mg total) by mouth 2 (two) times daily. 180 tablet 3  . pantoprazole (PROTONIX) 40 MG tablet Take 1 tablet (40 mg total) by mouth daily. 90 tablet 3  . potassium chloride SA (K-DUR,KLOR-CON) 20 MEQ tablet TAKE ONE TABLET TWICE DAILY 60 tablet 11  . PREMARIN 0.625 MG tablet TAKE ONE TABLET EACH DAY FOR 21 DAYS THEN DO NOT TAKE FOR 7 DAYS 63 tablet 1  . rosuvastatin (CRESTOR) 20 MG tablet TAKE ONE TABLET EACH DAY 90 tablet 3  . sertraline (ZOLOFT) 100 MG tablet Take 1 tablet (100 mg total) by mouth 2 (two) times daily. 180 tablet 3   No current facility-administered medications on file prior to visit.    Review of Systems  Constitutional: Negative for  other unusual diaphoresis or sweats HENT: Negative for ear discharge or swelling Eyes: Negative for other worsening visual disturbances Respiratory: Negative for stridor or other swelling  Gastrointestinal: Negative for worsening distension or other blood Genitourinary: Negative for retention or other urinary change Musculoskeletal: Negative for other MSK pain or swelling Skin: Negative for color change or other new lesions Neurological: Negative for worsening tremors and other numbness  Psychiatric/Behavioral: Negative for worsening agitation or other fatigue All other system neg per pt    Objective:   Physical Exam There were no vitals taken for this visit. VS  noted,  Constitutional: Pt appears in NAD HENT: Head: NCAT.  Right Ear: External ear normal.  Left Ear: External ear normal.  Eyes: . Pupils are equal, round, and reactive to light. Conjunctivae and EOM are normal Nose: without d/c or deformity Neck: Neck supple. Gross normal ROM Cardiovascular: Normal rate and regular rhythm.   Pulmonary/Chest: Effort normal and breath sounds without rales or wheezing.  Abd:  Soft, NT, ND, + BS, no organomegaly Neurological: Pt is alert. At baseline orientation, motor grossly intact Skin: Skin is warm. No rashes, other new lesions, no LE edema Psychiatric: Pt behavior is normal without agitation , 2+ nervous No other exam findings Lab Results  Component Value Date   WBC 5.5 10/15/2018   HGB 13.8 10/15/2018   HCT 41.3 10/15/2018   PLT 190.0 10/15/2018   GLUCOSE 88 10/15/2018   CHOL 162 10/15/2018   TRIG 112.0 10/15/2018   HDL 63.80 10/15/2018   LDLCALC 76 10/15/2018   ALT 10 10/15/2018   AST 15 10/15/2018   NA 140 10/15/2018   K 3.7 10/15/2018   CL 102 10/15/2018   CREATININE 1.08 10/15/2018   BUN 21 10/15/2018   CO2 29 10/15/2018   TSH 3.14 10/15/2018   INR 1.14 04/14/2017   HGBA1C 5.9 05/19/2008       Assessment & Plan:

## 2019-04-05 ENCOUNTER — Encounter: Payer: Self-pay | Admitting: Internal Medicine

## 2019-04-05 NOTE — Assessment & Plan Note (Signed)
For f/u lab with next labs

## 2019-04-05 NOTE — Assessment & Plan Note (Signed)
[place] for xanax asd refill, declines referral for counseling

## 2019-04-05 NOTE — Assessment & Plan Note (Signed)
Mild uncontrolled, for increased micardis 80 qd,  to f/u any worsening symptoms or concerns

## 2019-04-05 NOTE — Assessment & Plan Note (Signed)
?   Early mild cognitive impairment vs anxiety, continue to follow, declines further imaging or referral for now

## 2019-04-07 ENCOUNTER — Other Ambulatory Visit: Payer: Self-pay | Admitting: Internal Medicine

## 2019-04-07 MED ORDER — HYDROCODONE-ACETAMINOPHEN 5-325 MG PO TABS: 1.0000 | ORAL_TABLET | Freq: Four times a day (QID) | ORAL | 0 refills | Status: DC | PRN

## 2019-04-07 NOTE — Telephone Encounter (Signed)
Done erx 

## 2019-04-19 ENCOUNTER — Other Ambulatory Visit: Payer: Self-pay

## 2019-04-19 ENCOUNTER — Ambulatory Visit (INDEPENDENT_AMBULATORY_CARE_PROVIDER_SITE_OTHER): Payer: PPO

## 2019-04-19 DIAGNOSIS — Z23 Encounter for immunization: Secondary | ICD-10-CM

## 2019-05-05 ENCOUNTER — Other Ambulatory Visit (INDEPENDENT_AMBULATORY_CARE_PROVIDER_SITE_OTHER): Payer: PPO

## 2019-05-05 DIAGNOSIS — Z0000 Encounter for general adult medical examination without abnormal findings: Secondary | ICD-10-CM | POA: Diagnosis not present

## 2019-05-05 DIAGNOSIS — E611 Iron deficiency: Secondary | ICD-10-CM | POA: Diagnosis not present

## 2019-05-05 DIAGNOSIS — E538 Deficiency of other specified B group vitamins: Secondary | ICD-10-CM | POA: Diagnosis not present

## 2019-05-05 DIAGNOSIS — E559 Vitamin D deficiency, unspecified: Secondary | ICD-10-CM

## 2019-05-05 LAB — LIPID PANEL
Cholesterol: 153 mg/dL (ref 0–200)
HDL: 51.3 mg/dL (ref >39.00)
LDL Cholesterol: 71 mg/dL (ref 0–99)
NonHDL: 101.24
Total CHOL/HDL Ratio: 3
Triglycerides: 152 mg/dL — ABNORMAL HIGH (ref 0.0–149.0)
VLDL: 30.4 mg/dL (ref 0.0–40.0)

## 2019-05-05 LAB — HEPATIC FUNCTION PANEL
ALT: 9 U/L (ref 0–35)
AST: 14 U/L (ref 0–37)
Albumin: 4.2 g/dL (ref 3.5–5.2)
Alkaline Phosphatase: 52 U/L (ref 39–117)
Bilirubin, Direct: 0.1 mg/dL (ref 0.0–0.3)
Total Bilirubin: 0.5 mg/dL (ref 0.2–1.2)
Total Protein: 6.5 g/dL (ref 6.0–8.3)

## 2019-05-05 LAB — CBC WITH DIFFERENTIAL/PLATELET
Basophils Absolute: 0.1 10*3/uL (ref 0.0–0.1)
Basophils Relative: 1.3 % (ref 0.0–3.0)
Eosinophils Absolute: 0.3 10*3/uL (ref 0.0–0.7)
Eosinophils Relative: 5.4 % — ABNORMAL HIGH (ref 0.0–5.0)
HCT: 40.1 % (ref 36.0–46.0)
Hemoglobin: 13.4 g/dL (ref 12.0–15.0)
Lymphocytes Relative: 18.7 % (ref 12.0–46.0)
Lymphs Abs: 1.1 10*3/uL (ref 0.7–4.0)
MCHC: 33.5 g/dL (ref 30.0–36.0)
MCV: 89.7 fl (ref 78.0–100.0)
Monocytes Absolute: 0.4 10*3/uL (ref 0.1–1.0)
Monocytes Relative: 7 % (ref 3.0–12.0)
Neutro Abs: 4.2 10*3/uL (ref 1.4–7.7)
Neutrophils Relative %: 67.6 % (ref 43.0–77.0)
Platelets: 210 10*3/uL (ref 150.0–400.0)
RBC: 4.47 Mil/uL (ref 3.87–5.11)
RDW: 12.7 % (ref 11.5–15.5)
WBC: 6.1 10*3/uL (ref 4.0–10.5)

## 2019-05-05 LAB — URINALYSIS, ROUTINE W REFLEX MICROSCOPIC
Bilirubin Urine: NEGATIVE
Hgb urine dipstick: NEGATIVE
Ketones, ur: NEGATIVE
Leukocytes,Ua: NEGATIVE
Nitrite: NEGATIVE
RBC / HPF: NONE SEEN (ref 0–?)
Specific Gravity, Urine: 1.015 (ref 1.000–1.030)
Total Protein, Urine: NEGATIVE
Urine Glucose: NEGATIVE
Urobilinogen, UA: 0.2 (ref 0.0–1.0)
pH: 6.5 (ref 5.0–8.0)

## 2019-05-05 LAB — TSH: TSH: 4.01 u[IU]/mL (ref 0.35–4.50)

## 2019-05-05 LAB — BASIC METABOLIC PANEL
BUN: 20 mg/dL (ref 6–23)
CO2: 29 mEq/L (ref 19–32)
Calcium: 9.4 mg/dL (ref 8.4–10.5)
Chloride: 101 mEq/L (ref 96–112)
Creatinine, Ser: 1.06 mg/dL (ref 0.40–1.20)
GFR: 49.87 mL/min — ABNORMAL LOW (ref >60.00)
Glucose, Bld: 97 mg/dL (ref 70–99)
Potassium: 4 mEq/L (ref 3.5–5.1)
Sodium: 138 mEq/L (ref 135–145)

## 2019-05-05 LAB — VITAMIN B12: Vitamin B-12: 233 pg/mL (ref 211–911)

## 2019-05-05 LAB — VITAMIN D 25 HYDROXY (VIT D DEFICIENCY, FRACTURES): VITD: 36.51 ng/mL (ref 30.00–100.00)

## 2019-05-05 LAB — IBC PANEL
Iron: 79 ug/dL (ref 42–145)
Saturation Ratios: 23.7 % (ref 20.0–50.0)
Transferrin: 238 mg/dL (ref 212.0–360.0)

## 2019-05-08 ENCOUNTER — Encounter: Payer: Self-pay | Admitting: Internal Medicine

## 2019-05-08 ENCOUNTER — Ambulatory Visit (INDEPENDENT_AMBULATORY_CARE_PROVIDER_SITE_OTHER)
Admission: RE | Admit: 2019-05-08 | Discharge: 2019-05-08 | Disposition: A | Discharge status: Home / Self Care | Payer: PPO | Source: Ambulatory Visit | Attending: Internal Medicine | Admitting: Internal Medicine

## 2019-05-08 ENCOUNTER — Ambulatory Visit (INDEPENDENT_AMBULATORY_CARE_PROVIDER_SITE_OTHER): Payer: PPO | Admitting: Internal Medicine

## 2019-05-08 ENCOUNTER — Other Ambulatory Visit: Payer: Self-pay

## 2019-05-08 VITALS — BP 124/78 | HR 71 | Temp 98.4°F | Ht 63.0 in | Wt 141.0 lb

## 2019-05-08 DIAGNOSIS — F419 Anxiety disorder, unspecified: Secondary | ICD-10-CM

## 2019-05-08 DIAGNOSIS — N183 Chronic kidney disease, stage 3 unspecified: Secondary | ICD-10-CM

## 2019-05-08 DIAGNOSIS — I10 Essential (primary) hypertension: Secondary | ICD-10-CM

## 2019-05-08 DIAGNOSIS — R062 Wheezing: Secondary | ICD-10-CM | POA: Diagnosis not present

## 2019-05-08 DIAGNOSIS — E039 Hypothyroidism, unspecified: Secondary | ICD-10-CM | POA: Diagnosis not present

## 2019-05-08 DIAGNOSIS — I517 Cardiomegaly: Secondary | ICD-10-CM | POA: Diagnosis not present

## 2019-05-08 NOTE — Progress Notes (Signed)
Subjective:    Patient ID: Alyssa Fernandez, female    DOB: July 12, 1941, 78 y.o.   MRN: [id number]  HPI  Here to f/u; overall doing ok,  Pt denies chest pain, increasing sob or doe, orthopnea, PND, increased LE swelling, palpitations, dizziness or syncope, but has noticed wheezing at night in the past week.  Pt denies new neurological symptoms such as new headache, or facial or extremity weakness or numbness.  Pt denies polydipsia, polyuria, or low sugar episode.  Pt states overall good compliance with meds, mostly trying to follow appropriate diet, with wt overall stable,  but little exercise however.  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 Anxiety   . Atherosclerosis of aorta ([place]) 08/01/2017  . 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 GERD   . Hyperlipidemia   . Hypertension   . HYPOTHYROIDISM   . INSOMNIA   . 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 OSTEOPENIA   . Polycystic kidney disease   . RENAL FAILURE   . VITAMIN D DEFICIENCY    Past Surgical History:  Procedure Laterality Date  . ABDOMINAL HYSTERECTOMY    . 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 bladder operation, but per sling procedure    . BUNIONECTOMY    . CERVICAL DISC SURGERY    . fx rt ankle    . left temporal bx artery-negative 2     . PARTIAL NEPHRECTOMY    . percutaneous drainage of liver abscess  05/06/2012    reports that she has never smoked. She has never used smokeless tobacco. She reports that she does not drink alcohol or use drugs. family history includes Stroke in her mother. Allergies  Allergen Reactions  . Codeine Swelling  . Sulfonamide Derivatives Swelling  . Zocor [Simvastatin] Other (See Comments)    Cognitive decrease   Current Outpatient Medications on File Prior to Visit  Medication Sig Dispense Refill  . ALPRAZolam (XANAX) 0.5 MG tablet 1 tab by mouth twice  per day as needed 60 tablet 5  . amLODipine (NORVASC) 5 MG tablet Take 1 tablet (5 mg total) by mouth daily. 90 tablet 3  . amphetamine-dextroamphetamine (ADDERALL) 10 MG tablet TAKE ONE TABLET TWICE DAILY 60 tablet 0  . HYDROcodone-acetaminophen (NORCO/VICODIN) 5-325 MG tablet Take 1 tablet by mouth every 6 (six) hours as needed for moderate pain. 30 tablet 0  . Hypromellose (ARTIFICIAL TEARS OP) Place 1 drop into both eyes daily as needed (dry eyes).    . labetalol (NORMODYNE) 300 MG tablet Take 1 tablet (300 mg total) by mouth 2 (two) times daily. 180 tablet 3  . pantoprazole (PROTONIX) 40 MG tablet Take 1 tablet (40 mg total) by mouth daily. 90 tablet 3  . potassium chloride SA (K-DUR,KLOR-CON) 20 MEQ tablet TAKE ONE TABLET TWICE DAILY 60 tablet 11  . PREMARIN 0.625 MG tablet TAKE ONE TABLET EACH DAY FOR 21 DAYS THEN DO NOT TAKE FOR 7 DAYS 63 tablet 1  . rosuvastatin (CRESTOR) 20 MG tablet TAKE ONE TABLET EACH DAY 90 tablet 3  . sertraline (ZOLOFT) 100 MG tablet Take 1 tablet (100 mg total) by mouth 2 (two) times daily. 180 tablet 3  . telmisartan (MICARDIS) 80 MG tablet Take 1 tablet (80 mg total) by mouth daily. 90 tablet 3   No current facility-administered medications  on file prior to visit.    Review of Systems  Constitutional: Negative for other unusual diaphoresis or sweats HENT: Negative for ear discharge or swelling Eyes: Negative for other worsening visual disturbances Respiratory: Negative for stridor or other swelling  Gastrointestinal: Negative for worsening distension or other blood Genitourinary: Negative for retention or other urinary change Musculoskeletal: Negative for other MSK pain or swelling Skin: Negative for color change or other new lesions Neurological: Negative for worsening tremors and other numbness  Psychiatric/Behavioral: Negative for worsening agitation or other fatigue All otherwise neg per pt    Objective:   Physical Exam BP 124/78   Pulse 71    Temp 98.4 F (36.9 C) (Oral)   Ht 5\' 3"  (1.6 m)   Wt 141 lb (64 kg)   SpO2 94%   BMI 24.98 kg/m  VS noted,  Constitutional: Pt appears in NAD HENT: Head: NCAT.  Right Ear: External ear normal.  Left Ear: External ear normal.  Eyes: . Pupils are equal, round, and reactive to light. Conjunctivae and EOM are normal Nose: without d/c or deformity Neck: Neck supple. Gross normal ROM Cardiovascular: Normal rate and regular rhythm.   Pulmonary/Chest: Effort normal and breath sounds without rales or wheezing.  Abd:  Soft, NT, ND, + BS, no organomegaly Neurological: Pt is alert. At baseline orientation, motor grossly intact Skin: Skin is warm. No rashes, other new lesions, no LE edema Psychiatric: Pt behavior is normal without agitation  All otherwise neg per pt Lab Results  Component Value Date   WBC 6.1 05/05/2019   HGB 13.4 05/05/2019   HCT 40.1 05/05/2019   PLT 210.0 05/05/2019   GLUCOSE 97 05/05/2019   CHOL 153 05/05/2019   TRIG 152.0 (H) 05/05/2019   HDL 51.30 05/05/2019   LDLCALC 71 05/05/2019   ALT 9 05/05/2019   AST 14 05/05/2019   NA 138 05/05/2019   K 4.0 05/05/2019   CL 101 05/05/2019   CREATININE 1.06 05/05/2019   BUN 20 05/05/2019   CO2 29 05/05/2019   TSH 4.01 05/05/2019   INR 1.14 04/14/2017   HGBA1C 5.9 05/19/2008          Assessment & Plan:

## 2019-05-08 NOTE — Assessment & Plan Note (Signed)
stable overall by history and exam, recent data reviewed with pt, and pt to continue medical treatment as before,  to f/u any worsening symptoms or concerns  

## 2019-05-08 NOTE — Assessment & Plan Note (Signed)
Also for cxr, exam benign,  to f/u any worsening symptoms or concerns

## 2019-05-08 NOTE — Patient Instructions (Addendum)
Please continue all other medications as before, and refills have been done if requested.  Please have the pharmacy call with any other refills you may need.  Please continue your efforts at being more active, low cholesterol diet, and weight control.  You are otherwise up to date with prevention measures today.  Please keep your appointments with your specialists as you may have planned  Please go to the XRAY Department in the Basement (go straight as you get off the elevator) for the x-ray testing  You will be contacted by phone if any changes need to be made immediately.  Otherwise, you will receive a letter about your results with an explanation, but please check with MyChart first.  Please remember to sign up for MyChart if you have not done so, as this will be important to you in the future with finding out test results, communicating by private email, and scheduling acute appointments online when needed.  Please return in 6 months, or sooner if needed

## 2019-05-12 ENCOUNTER — Encounter: Payer: Self-pay | Admitting: Internal Medicine

## 2019-05-12 DIAGNOSIS — E2839 Other primary ovarian failure: Secondary | ICD-10-CM

## 2019-05-12 NOTE — Telephone Encounter (Signed)
Staff to schedule pt for DXA please

## 2019-05-26 ENCOUNTER — Inpatient Hospital Stay: Admission: RE | Admit: 2019-05-26 | Payer: PPO | Source: Ambulatory Visit

## 2019-05-29 ENCOUNTER — Encounter: Payer: Self-pay | Admitting: Internal Medicine

## 2019-06-05 ENCOUNTER — Encounter: Payer: Self-pay | Admitting: Internal Medicine

## 2019-06-06 NOTE — Telephone Encounter (Signed)
Staff to assist with DXA scheduling for dec or after

## 2019-06-07 ENCOUNTER — Encounter: Payer: Self-pay | Admitting: Internal Medicine

## 2019-06-10 ENCOUNTER — Other Ambulatory Visit: Payer: Self-pay | Admitting: Internal Medicine

## 2019-06-10 ENCOUNTER — Ambulatory Visit (INDEPENDENT_AMBULATORY_CARE_PROVIDER_SITE_OTHER)
Admission: RE | Admit: 2019-06-10 | Discharge: 2019-06-10 | Disposition: A | Discharge status: Home / Self Care | Payer: PPO | Source: Ambulatory Visit | Attending: Internal Medicine | Admitting: Internal Medicine

## 2019-06-10 ENCOUNTER — Encounter: Payer: Self-pay | Admitting: Internal Medicine

## 2019-06-10 ENCOUNTER — Other Ambulatory Visit: Payer: Self-pay

## 2019-06-10 DIAGNOSIS — E2839 Other primary ovarian failure: Secondary | ICD-10-CM

## 2019-06-10 MED ORDER — ALENDRONATE SODIUM 70 MG PO TABS: 70.0000 mg | ORAL_TABLET | ORAL | 11 refills | Status: DC

## 2019-06-11 NOTE — Telephone Encounter (Signed)
Ms Alyssa Fernandez to see pt concern

## 2019-06-20 ENCOUNTER — Ambulatory Visit: Payer: PPO | Admitting: Internal Medicine

## 2019-06-20 ENCOUNTER — Encounter: Payer: Self-pay | Admitting: Internal Medicine

## 2019-06-20 ENCOUNTER — Telehealth: Payer: PPO | Admitting: Physician Assistant

## 2019-06-20 ENCOUNTER — Other Ambulatory Visit: Payer: Self-pay

## 2019-06-20 DIAGNOSIS — R059 Cough, unspecified: Secondary | ICD-10-CM

## 2019-06-20 DIAGNOSIS — R5383 Other fatigue: Secondary | ICD-10-CM

## 2019-06-20 DIAGNOSIS — R05 Cough: Secondary | ICD-10-CM

## 2019-06-20 NOTE — Progress Notes (Signed)
Patient ID: Alyssa Fernandez, female   DOB: 1941/01/06, 78 y.o.   MRN: [id number]  Pt no show for doxy video or several phone attempts  Cathlean Cower MD

## 2019-06-20 NOTE — Progress Notes (Signed)
Good morning Alyssa Fernandez,   I am sorry you are not feeling well.  I am concerned about your cough and want to make sure you are treated most appropriately.  I would feel more comfortable if a medical provider listened to your lungs.  I see you are seeing Dr. Cathlean Cower on a regular basis.  Please call his office to schedule an appointment.  If you cannot get an appointment there, please see below for a list of [facility] centers.     Based on what you shared with me, I feel your condition warrants further evaluation and I recommend that you be seen for a face to face office visit.  NOTE: If you entered your credit card information for this eVisit, you will not be charged. You may see a "hold" on your card for the $35 but that hold will drop off and you will not have a charge processed.  If you are having a true medical emergency please call 911.     For an urgent face to face visit, [facility] has four urgent care centers for your convenience:   . [facility]    [phone]                  Get Driving Directions  [id number] [place], [place] [address] . 10 am to 8 pm Monday-Friday . 12 pm to 8 pm Saturday-Sunday   . [facility] at [place]                  Get Driving Directions  [id number] [place], [place], [place] [address] . 8 am to 8 pm Monday-Friday . 9 am to 6 pm Saturday . 11 am to 6 pm Sunday   . [facility] at [place]                  Get Driving Directions   [address] [place], [place] [address] . 8 am to 8 pm Monday-Friday . 8 am to 4 pm Saturday-Sunday    . [facility] at [place]                    Get Driving Directions  [id number]  [address], [place], [place] [address]  . Monday-Friday, 12 PM to 6 PM    Your e-visit answers were reviewed by a board certified advanced clinical practitioner to  complete your personal care plan.  Thank you for using e-Visits.

## 2019-06-21 ENCOUNTER — Encounter: Payer: Self-pay | Admitting: Internal Medicine

## 2019-06-21 NOTE — Patient Instructions (Signed)
none

## 2019-07-05 ENCOUNTER — Encounter: Payer: Self-pay | Admitting: Internal Medicine

## 2019-07-10 ENCOUNTER — Encounter: Payer: Self-pay | Admitting: Internal Medicine

## 2019-08-05 ENCOUNTER — Encounter: Payer: Self-pay | Admitting: Internal Medicine

## 2019-08-06 ENCOUNTER — Other Ambulatory Visit: Payer: Self-pay | Admitting: Internal Medicine

## 2019-08-06 MED ORDER — ARIPIPRAZOLE 5 MG PO TABS: 5.0000 mg | ORAL_TABLET | Freq: Every day | ORAL | 3 refills | Status: DC

## 2019-09-16 ENCOUNTER — Other Ambulatory Visit: Payer: Self-pay | Admitting: Internal Medicine

## 2019-09-16 NOTE — Telephone Encounter (Signed)
pls advise if ok to refill../lmb 

## 2019-10-07 ENCOUNTER — Other Ambulatory Visit: Payer: Self-pay | Admitting: Internal Medicine

## 2019-10-07 NOTE — Telephone Encounter (Signed)
Done erx 

## 2019-11-05 ENCOUNTER — Encounter: Payer: Self-pay | Admitting: Internal Medicine

## 2019-11-05 ENCOUNTER — Ambulatory Visit (INDEPENDENT_AMBULATORY_CARE_PROVIDER_SITE_OTHER): Payer: PPO | Admitting: Internal Medicine

## 2019-11-05 ENCOUNTER — Other Ambulatory Visit: Payer: Self-pay

## 2019-11-05 VITALS — BP 166/94 | HR 62 | Temp 97.9°F | Ht 63.0 in | Wt 144.0 lb

## 2019-11-05 DIAGNOSIS — R059 Cough, unspecified: Secondary | ICD-10-CM

## 2019-11-05 DIAGNOSIS — R05 Cough: Secondary | ICD-10-CM | POA: Diagnosis not present

## 2019-11-05 DIAGNOSIS — R0600 Dyspnea, unspecified: Secondary | ICD-10-CM | POA: Diagnosis not present

## 2019-11-05 DIAGNOSIS — E039 Hypothyroidism, unspecified: Secondary | ICD-10-CM | POA: Diagnosis not present

## 2019-11-05 DIAGNOSIS — I10 Essential (primary) hypertension: Secondary | ICD-10-CM | POA: Diagnosis not present

## 2019-11-05 DIAGNOSIS — N183 Chronic kidney disease, stage 3 unspecified: Secondary | ICD-10-CM | POA: Diagnosis not present

## 2019-11-05 MED ORDER — AMLODIPINE BESYLATE 10 MG PO TABS: 10.0000 mg | ORAL_TABLET | Freq: Every day | ORAL | 3 refills | Status: DC

## 2019-11-05 MED ORDER — MUCINEX 600 MG PO TB12: 1200.0000 mg | ORAL_TABLET | Freq: Two times a day (BID) | ORAL | 2 refills | Status: DC | PRN

## 2019-11-05 MED ORDER — HYDROCODONE-HOMATROPINE 5-1.5 MG/5ML PO SYRP: 5.0000 mL | ORAL_SOLUTION | Freq: Four times a day (QID) | ORAL | 0 refills | Status: AC | PRN

## 2019-11-05 NOTE — Patient Instructions (Signed)
.  Please take all new medication as prescribed - the cough medicine, and mucinex for congestion as needed  Ok to increase the amlodipine to 10 mg for blood pressure  You will be contacted regarding the referral for: CT scan for the Chest  Please continue all other medications as before, and refills have been done if requested.  Please have the pharmacy call with any other refills you may need.  Please continue your efforts at being more active, low cholesterol diet, and weight control  Please keep your appointments with your specialists as you may have planned  Please make an Appointment to return in 3 months, or sooner if needed

## 2019-11-05 NOTE — Assessment & Plan Note (Signed)
With new cough non prod, declines cxr, for ct chest, cough me prn, mucinex otc prn, consider pulm referral

## 2019-11-05 NOTE — Assessment & Plan Note (Signed)
stable overall by history and exam, recent data reviewed with pt, and pt to continue medical treatment as before,  to f/u any worsening symptoms or concerns  

## 2019-11-05 NOTE — Progress Notes (Signed)
Subjective:    Patient ID: Alyssa Fernandez, female    DOB: 11-29-1940, 79 y.o.   MRN: [id number]  HPI  Here with 1 mo persistent off and on chest congestion, non prod cough, sob and mild doe, and Pt denies chest pain, wheezing, orthopnea, PND, increased LE swelling, palpitations, dizziness or syncope. Worse to bend at the waist.  Can walk 75miles in over 30 minutes maybe twice per wk.  CXR neg for acute sept 2020.  Asks for cough med.  Pt denies new neurological symptoms such as new headache, or facial or extremity weakness or numbness   Pt denies polydipsia, polyuria.  Denies hyper or hypo thyroid symptoms such as voice, skin or hair change. Past Medical History:  Diagnosis Date  . Anxiety   . Atherosclerosis of aorta ([place]) 08/01/2017  . 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 GERD   . Hyperlipidemia   . Hypertension   . HYPOTHYROIDISM   . INSOMNIA   . 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 OSTEOPENIA   . Polycystic kidney disease   . RENAL FAILURE   . VITAMIN D DEFICIENCY    Past Surgical History:  Procedure Laterality Date  . ABDOMINAL HYSTERECTOMY    . 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 bladder operation, but per sling procedure    . BUNIONECTOMY    . CERVICAL DISC SURGERY    . fx rt ankle    . left temporal bx artery-negative 2     . PARTIAL NEPHRECTOMY    . percutaneous drainage of liver abscess  05/06/2012    reports that she has never smoked. She has never used smokeless tobacco. She reports that she does not drink alcohol or use drugs. family history includes Stroke in her mother. Allergies  Allergen Reactions  . Codeine Swelling  . Sulfonamide Derivatives Swelling  . Zocor [Simvastatin] Other (See Comments)    Cognitive decrease   Current Outpatient Medications on File Prior to Visit  Medication Sig Dispense Refill  . alendronate (FOSAMAX) 70 MG tablet Take 1 tablet (70 mg total) by mouth every 7 (seven) days. Take with a full glass of  water on an empty stomach. 4 tablet 11  . ALPRAZolam (XANAX) 0.5 MG tablet 1 tab by mouth twice per day as needed 60 tablet 5  . amphetamine-dextroamphetamine (ADDERALL) 10 MG tablet TAKE ONE TABLET TWICE DAILY 60 tablet 0  . ARIPiprazole (ABILIFY) 5 MG tablet Take 1 tablet (5 mg total) by mouth daily. 90 tablet 3  . HYDROcodone-acetaminophen (NORCO/VICODIN) 5-325 MG tablet Take 1 tablet by mouth every 6 (six) hours as needed for moderate pain. 30 tablet 0  . Hypromellose (ARTIFICIAL TEARS OP) Place 1 drop into both eyes daily as needed (dry eyes).    . labetalol (NORMODYNE) 300 MG tablet Take 1 tablet (300 mg total) by mouth 2 (two) times daily. 180 tablet 3  . pantoprazole (PROTONIX) 40 MG tablet Take 1 tablet (40 mg total) by mouth daily. 90 tablet 3  . potassium chloride SA (K-DUR,KLOR-CON) 20 MEQ tablet TAKE ONE TABLET TWICE DAILY 60 tablet 11  . PREMARIN 0.625 MG tablet TAKE ONE TABLET DAILY FOR 21 DAYS THEN DO NOT TAKE FOR 7 DAYS 63 tablet 1  . rosuvastatin (CRESTOR) 20 MG tablet TAKE ONE TABLET EACH DAY 90 tablet 3  . sertraline (ZOLOFT) 100 MG tablet Take 1 tablet (100 mg total) by mouth 2 (two) times daily.  180 tablet 3  . telmisartan (MICARDIS) 80 MG tablet Take 1 tablet (80 mg total) by mouth daily. 90 tablet 3   No current facility-administered medications on file prior to visit.   Review of Systems All otherwise neg per pt     Objective:   Physical Exam BP (!) 166/94   Pulse 62   Temp 97.9 F (36.6 C)   Ht 5\' 3"  (1.6 m)   Wt 144 lb (65.3 kg)   SpO2 98%   BMI 25.51 kg/m  VS noted,  Constitutional: Pt appears in NAD HENT: Head: NCAT.  Right Ear: External ear normal.  Left Ear: External ear normal.  Eyes: . Pupils are equal, round, and reactive to light. Conjunctivae and EOM are normal Nose: without d/c or deformity Neck: Neck supple. Gross normal ROM Cardiovascular: Normal rate and regular rhythm.   Pulmonary/Chest: Effort normal and breath sounds without rales or  wheezing.  Neurological: Pt is alert. At baseline orientation, motor grossly intact Skin: Skin is warm. No rashes, other new lesions Psychiatric: Pt behavior is normal without agitation  All otherwise neg per pt Chronic 1+ bilat edema Lab Results  Component Value Date   WBC 6.1 05/05/2019   HGB 13.4 05/05/2019   HCT 40.1 05/05/2019   PLT 210.0 05/05/2019   GLUCOSE 97 05/05/2019   CHOL 153 05/05/2019   TRIG 152.0 (H) 05/05/2019   HDL 51.30 05/05/2019   LDLCALC 71 05/05/2019   ALT 9 05/05/2019   AST 14 05/05/2019   NA 138 05/05/2019   K 4.0 05/05/2019   CL 101 05/05/2019   CREATININE 1.06 05/05/2019   BUN 20 05/05/2019   CO2 29 05/05/2019   TSH 4.01 05/05/2019   INR 1.14 04/14/2017   HGBA1C 5.9 05/19/2008       Assessment & Plan:

## 2019-11-06 ENCOUNTER — Ambulatory Visit
Admission: RE | Admit: 2019-11-06 | Discharge: 2019-11-06 | Disposition: A | Discharge status: Home / Self Care | Payer: PPO | Source: Ambulatory Visit | Attending: Internal Medicine | Admitting: Internal Medicine

## 2019-11-06 DIAGNOSIS — I313 Pericardial effusion (noninflammatory): Secondary | ICD-10-CM | POA: Diagnosis not present

## 2019-11-06 DIAGNOSIS — R059 Cough, unspecified: Secondary | ICD-10-CM

## 2019-11-06 DIAGNOSIS — R05 Cough: Secondary | ICD-10-CM

## 2019-11-07 ENCOUNTER — Encounter: Payer: Self-pay | Admitting: Internal Medicine

## 2019-11-07 ENCOUNTER — Other Ambulatory Visit: Payer: Self-pay

## 2019-11-07 ENCOUNTER — Ambulatory Visit (INDEPENDENT_AMBULATORY_CARE_PROVIDER_SITE_OTHER): Payer: PPO | Admitting: Internal Medicine

## 2019-11-07 ENCOUNTER — Ambulatory Visit (INDEPENDENT_AMBULATORY_CARE_PROVIDER_SITE_OTHER): Payer: PPO

## 2019-11-07 VITALS — BP 140/76 | HR 67 | Temp 98.4°F | Ht 63.0 in | Wt 150.0 lb

## 2019-11-07 DIAGNOSIS — F419 Anxiety disorder, unspecified: Secondary | ICD-10-CM

## 2019-11-07 DIAGNOSIS — N183 Chronic kidney disease, stage 3 unspecified: Secondary | ICD-10-CM | POA: Diagnosis not present

## 2019-11-07 DIAGNOSIS — S62111A Displaced fracture of triquetrum [cuneiform] bone, right wrist, initial encounter for closed fracture: Secondary | ICD-10-CM | POA: Diagnosis not present

## 2019-11-07 DIAGNOSIS — M25531 Pain in right wrist: Secondary | ICD-10-CM | POA: Diagnosis not present

## 2019-11-07 DIAGNOSIS — I10 Essential (primary) hypertension: Secondary | ICD-10-CM

## 2019-11-07 NOTE — Patient Instructions (Addendum)
Ok for a right wrist wrap for now, and use a right wrist splint at home until improved  Please continue all other medications as before  Please have the pharmacy call with any other refills you may need.  Please keep your appointments with your specialists as you may have planned  Please go to the XRAY Department in the first floor for the x-ray testing  You will be contacted by phone if any changes need to be made immediately.  Otherwise, you will receive a letter about your results with an explanation, but please check with MyChart first.  Please remember to sign up for MyChart if you have not done so, as this will be important to you in the future with finding out test results, communicating by private email, and scheduling acute appointments online when needed.

## 2019-11-08 ENCOUNTER — Encounter: Payer: Self-pay | Admitting: Family Medicine

## 2019-11-09 ENCOUNTER — Encounter: Payer: Self-pay | Admitting: Internal Medicine

## 2019-11-09 ENCOUNTER — Other Ambulatory Visit: Payer: Self-pay | Admitting: Internal Medicine

## 2019-11-09 DIAGNOSIS — S62101D Fracture of unspecified carpal bone, right wrist, subsequent encounter for fracture with routine healing: Secondary | ICD-10-CM

## 2019-11-09 DIAGNOSIS — M25531 Pain in right wrist: Secondary | ICD-10-CM

## 2019-11-09 HISTORY — DX: Pain in right wrist: M25.531

## 2019-11-09 NOTE — Progress Notes (Signed)
Subjective:    Patient ID: Alyssa Fernandez, female    DOB: December 12, 1940, 79 y.o.   MRN: [id number]  HPI  Here with c/o unfortunate trip and fall to the right hand while walkin, now with 2-3+ swelling, bruising 7/10 pain primarily to post wrist area, constant, worse to move it, better when still.  Pt denies chest pain, increased sob or doe, wheezing, orthopnea, PND, increased LE swelling, palpitations, dizziness or syncope.  Pt denies new neurological symptoms such as new headache, or facial or extremity weakness or numbness   Pt denies polydipsia, polyuria Past Medical History:  Diagnosis Date  . Anxiety   . Atherosclerosis of aorta ([place]) 08/01/2017  . 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 GERD   . Hyperlipidemia   . Hypertension   . HYPOTHYROIDISM   . INSOMNIA   . 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 OSTEOPENIA   . Polycystic kidney disease   . RENAL FAILURE   . VITAMIN D DEFICIENCY    Past Surgical History:  Procedure Laterality Date  . ABDOMINAL HYSTERECTOMY    . 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 bladder operation, but per sling procedure    . BUNIONECTOMY    . CERVICAL DISC SURGERY    . fx rt ankle    . left temporal bx artery-negative 2     . PARTIAL NEPHRECTOMY    . percutaneous drainage of liver abscess  05/06/2012    reports that she has never smoked. She has never used smokeless tobacco. She reports that she does not drink alcohol or use drugs. family history includes Stroke in her mother. Allergies  Allergen Reactions  . Codeine Swelling  . Sulfonamide Derivatives Swelling  . Zocor [Simvastatin] Other (See Comments)    Cognitive decrease   Current Outpatient Medications on File Prior to Visit  Medication Sig Dispense Refill  . alendronate (FOSAMAX) 70 MG tablet Take 1 tablet (70 mg total) by mouth every 7 (seven) days. Take with a full glass of water on an empty stomach. 4 tablet 11  . ALPRAZolam (XANAX) 0.5 MG tablet 1 tab by mouth  twice per day as needed 60 tablet 5  . amLODipine (NORVASC) 10 MG tablet Take 1 tablet (10 mg total) by mouth daily. 90 tablet 3  . amphetamine-dextroamphetamine (ADDERALL) 10 MG tablet TAKE ONE TABLET TWICE DAILY 60 tablet 0  . ARIPiprazole (ABILIFY) 5 MG tablet Take 1 tablet (5 mg total) by mouth daily. 90 tablet 3  . guaiFENesin (MUCINEX) 600 MG 12 hr tablet Take 2 tablets (1,200 mg total) by mouth 2 (two) times daily as needed. 60 tablet 2  . HYDROcodone-acetaminophen (NORCO/VICODIN) 5-325 MG tablet Take 1 tablet by mouth every 6 (six) hours as needed for moderate pain. 30 tablet 0  . HYDROcodone-homatropine (HYCODAN) 5-1.5 MG/5ML syrup Take 5 mLs by mouth every 6 (six) hours as needed for up to 10 days for cough. 180 mL 0  . Hypromellose (ARTIFICIAL TEARS OP) Place 1 drop into both eyes daily as needed (dry eyes).    . labetalol (NORMODYNE) 300 MG tablet Take 1 tablet (300 mg total) by mouth 2 (two) times daily. 180 tablet 3  . pantoprazole (PROTONIX) 40 MG tablet Take 1 tablet (40 mg total) by mouth daily. 90 tablet 3  . potassium chloride SA (K-DUR,KLOR-CON) 20 MEQ tablet TAKE ONE TABLET TWICE DAILY 60 tablet 11  . PREMARIN 0.625 MG tablet TAKE ONE  TABLET DAILY FOR 21 DAYS THEN DO NOT TAKE FOR 7 DAYS 63 tablet 1  . rosuvastatin (CRESTOR) 20 MG tablet TAKE ONE TABLET EACH DAY 90 tablet 3  . sertraline (ZOLOFT) 100 MG tablet Take 1 tablet (100 mg total) by mouth 2 (two) times daily. 180 tablet 3  . telmisartan (MICARDIS) 80 MG tablet Take 1 tablet (80 mg total) by mouth daily. 90 tablet 3   No current facility-administered medications on file prior to visit.   Review of Systems All otherwise neg per pt     Objective:   Physical Exam BP 140/76 (BP Location: Left Arm, Patient Position: Sitting, Cuff Size: Normal)   Pulse 67   Temp 98.4 F (36.9 C) (Oral)   Ht 5\' 3"  (1.6 m)   Wt 150 lb (68 kg)   SpO2 94%   BMI 26.57 kg/m  VS noted,  Constitutional: Pt appears in NAD HENT: Head:  NCAT.  Right Ear: External ear normal.  Left Ear: External ear normal.  Eyes: . Pupils are equal, round, and reactive to light. Conjunctivae and EOM are normal Nose: without d/c or deformity Neck: Neck supple. Gross normal ROM Cardiovascular: Normal rate and regular rhythm.   Pulmonary/Chest: Effort normal and breath sounds without rales or wheezing.  Right wrist with 2-3+ swelling,bruising and tender to the post hand at the wrist without obvious other deformity and neurovasc intact Neurological: Pt is alert. At baseline orientation, motor grossly intact Skin: Skin is warm. No rashes, other new lesions, no LE edema Psychiatric: Pt behavior is normal without agitation  All otherwise neg per pt Lab Results  Component Value Date   WBC 6.1 05/05/2019   HGB 13.4 05/05/2019   HCT 40.1 05/05/2019   PLT 210.0 05/05/2019   GLUCOSE 97 05/05/2019   CHOL 153 05/05/2019   TRIG 152.0 (H) 05/05/2019   HDL 51.30 05/05/2019   LDLCALC 71 05/05/2019   ALT 9 05/05/2019   AST 14 05/05/2019   NA 138 05/05/2019   K 4.0 05/05/2019   CL 101 05/05/2019   CREATININE 1.06 05/05/2019   BUN 20 05/05/2019   CO2 29 05/05/2019   TSH 4.01 05/05/2019   INR 1.14 04/14/2017   HGBA1C 5.9 05/19/2008          Assessment & Plan:

## 2019-11-09 NOTE — Assessment & Plan Note (Signed)
stable overall by history and exam, recent data reviewed with pt, and pt to continue medical treatment as before,  to f/u any worsening symptoms or concerns  

## 2019-11-09 NOTE — Assessment & Plan Note (Addendum)
High suspicion for wrist fracture, d/w pt, for pain control, xray, but wants to hold on referral until xray results  I spent 31 minutes in preparing to see the patient by review of recent labs, imaging and procedures, obtaining and reviewing separately obtained history, communicating with the patient and family or caregiver, ordering medications, tests or procedures, and documenting clinical information in the EHR including the differential Dx, treatment, and any further evaluation and other management of right wrist pain, THn, CKD, anxiety

## 2019-11-09 NOTE — Assessment & Plan Note (Signed)
With mild situational worsening, d/w pt to reassure, cont same tx

## 2019-11-12 DIAGNOSIS — M25431 Effusion, right wrist: Secondary | ICD-10-CM | POA: Insufficient documentation

## 2019-11-12 DIAGNOSIS — M25531 Pain in right wrist: Secondary | ICD-10-CM | POA: Diagnosis not present

## 2019-11-26 ENCOUNTER — Other Ambulatory Visit: Payer: Self-pay | Admitting: Internal Medicine

## 2019-11-26 NOTE — Telephone Encounter (Signed)
Done erx 

## 2019-12-17 ENCOUNTER — Other Ambulatory Visit: Payer: Self-pay

## 2019-12-17 ENCOUNTER — Ambulatory Visit (INDEPENDENT_AMBULATORY_CARE_PROVIDER_SITE_OTHER): Payer: PPO | Admitting: Internal Medicine

## 2019-12-17 ENCOUNTER — Encounter: Payer: Self-pay | Admitting: Internal Medicine

## 2019-12-17 VITALS — BP 150/90 | HR 85 | Temp 98.5°F | Ht 63.0 in | Wt 150.0 lb

## 2019-12-17 DIAGNOSIS — N183 Chronic kidney disease, stage 3 unspecified: Secondary | ICD-10-CM

## 2019-12-17 DIAGNOSIS — I10 Essential (primary) hypertension: Secondary | ICD-10-CM | POA: Diagnosis not present

## 2019-12-17 DIAGNOSIS — R609 Edema, unspecified: Secondary | ICD-10-CM

## 2019-12-17 MED ORDER — HYDROCHLOROTHIAZIDE 25 MG PO TABS
25.0000 mg | ORAL_TABLET | Freq: Every day | ORAL | 3 refills | Status: DC
Start: 2019-12-17 — End: 2021-04-01

## 2019-12-17 MED ORDER — AMLODIPINE BESYLATE 5 MG PO TABS: 5.0000 mg | ORAL_TABLET | Freq: Every day | ORAL | 3 refills | Status: DC

## 2019-12-17 NOTE — Assessment & Plan Note (Addendum)
Ok to decrease the amlodipine to 5 qd, cont all other prior tx  I spent 31 minutes in preparing to see the patient by review of recent labs, imaging and procedures, obtaining and reviewing separately obtained history, communicating with the patient and family or caregiver, ordering medications, tests or procedures, and documenting clinical information in the EHR including the differential Dx, treatment, and any further evaluation and other management of htn, edema, ckd

## 2019-12-17 NOTE — Assessment & Plan Note (Signed)
stable overall by history and exam, recent data reviewed with pt, and pt to continue medical treatment as before,  to f/u any worsening symptoms or concerns  

## 2019-12-17 NOTE — Patient Instructions (Signed)
Ok to change the amlodipine back to the 5 mg per day  Please take all new medication as prescribed - the HCT 25 mg per day, which helps swelling and blood pressure  Please continue all other medications as before, and refills have been done if requested.  Please have the pharmacy call with any other refills you may need.  Please continue your efforts at being more active, low cholesterol diet, and weight control.  Please keep your appointments with your specialists as you may have planned  Please return if the swelling is not improved in 1-2 wks

## 2019-12-17 NOTE — Progress Notes (Signed)
Subjective:    Patient ID: Alyssa Fernandez, female    DOB: 15-Oct-1940, 79 y.o.   MRN: [id number]  HPI  Here to f/u; overall doing ok,  Pt denies chest pain, increasing sob or doe, wheezing, orthopnea, PND, palpitations, dizziness or syncope but has had worsening LE edema over baseline since amlodipine increased mar 17.  Pt denies new neurological symptoms such as new headache, or facial or extremity weakness or numbness.  Pt denies polydipsia, polyuria, or low sugar episode.  Pt states overall good compliance with meds, mostly trying to follow appropriate diet, with wt overall stable,  But BP has been running higher > 140/90 Past Medical History:  Diagnosis Date  . Anxiety   . Atherosclerosis of aorta ([place]) 08/01/2017  . 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 GERD   . Hyperlipidemia   . Hypertension   . HYPOTHYROIDISM   . INSOMNIA   . 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 OSTEOPENIA   . Polycystic kidney disease   . RENAL FAILURE   . VITAMIN D DEFICIENCY    Past Surgical History:  Procedure Laterality Date  . ABDOMINAL HYSTERECTOMY    . 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 bladder operation, but per sling procedure    . BUNIONECTOMY    . CERVICAL DISC SURGERY    . fx rt ankle    . left temporal bx artery-negative 2     . PARTIAL NEPHRECTOMY    . percutaneous drainage of liver abscess  05/06/2012    reports that she has never smoked. She has never used smokeless tobacco. She reports that she does not drink alcohol or use drugs. family history includes Stroke in her mother. Allergies  Allergen Reactions  . Codeine Swelling  . Sulfonamide Derivatives Swelling  . Zocor [Simvastatin] Other (See Comments)    Cognitive decrease   Current Outpatient Medications on File Prior to Visit  Medication Sig Dispense Refill  . ALPRAZolam (XANAX) 0.5 MG tablet TAKE ONE TABLET TWICE DAILY AS NEEDED 60 tablet 5  . amphetamine-dextroamphetamine (ADDERALL) 10 MG  tablet TAKE ONE TABLET TWICE DAILY 60 tablet 0  . ARIPiprazole (ABILIFY) 5 MG tablet Take 1 tablet (5 mg total) by mouth daily. 90 tablet 3  . guaiFENesin (MUCINEX) 600 MG 12 hr tablet Take 2 tablets (1,200 mg total) by mouth 2 (two) times daily as needed. 60 tablet 2  . HYDROcodone-acetaminophen (NORCO/VICODIN) 5-325 MG tablet Take 1 tablet by mouth every 6 (six) hours as needed for moderate pain. 30 tablet 0  . Hypromellose (ARTIFICIAL TEARS OP) Place 1 drop into both eyes daily as needed (dry eyes).    . labetalol (NORMODYNE) 300 MG tablet Take 1 tablet (300 mg total) by mouth 2 (two) times daily. 180 tablet 3  . potassium chloride SA (K-DUR,KLOR-CON) 20 MEQ tablet TAKE ONE TABLET TWICE DAILY 60 tablet 11  . PREMARIN 0.625 MG tablet TAKE ONE TABLET DAILY FOR 21 DAYS THEN DO NOT TAKE FOR 7 DAYS 63 tablet 1  . rosuvastatin (CRESTOR) 20 MG tablet TAKE ONE TABLET EACH DAY 90 tablet 3  . sertraline (ZOLOFT) 100 MG tablet TAKE ONE TABLET TWICE DAILY 180 tablet 3  . telmisartan (MICARDIS) 80 MG tablet Take 1 tablet (80 mg total) by mouth daily. 90 tablet 3   No current facility-administered medications on file prior to visit.   Review of Systems All otherwise neg per pt  Objective:   Physical Exam BP (!) 150/90 (BP Location: Left Arm, Patient Position: Sitting, Cuff Size: Small)   Pulse 85   Temp 98.5 F (36.9 C) (Oral)   Ht 5\' 3"  (1.6 m)   Wt 150 lb (68 kg)   SpO2 96%   BMI 26.57 kg/m  VS noted,  Constitutional: Pt appears in NAD HENT: Head: NCAT.  Right Ear: External ear normal.  Left Ear: External ear normal.  Eyes: . Pupils are equal, round, and reactive to light. Conjunctivae and EOM are normal Nose: without d/c or deformity Neck: Neck supple. Gross normal ROM Cardiovascular: Normal rate and regular rhythm.   Pulmonary/Chest: Effort normal and breath sounds without rales or wheezing.  Abd:  Soft, NT, ND, + BS, no organomegaly Neurological: Pt is alert. At baseline  orientation, motor grossly intact Skin: Skin is warm. No rashes, other new lesions, 2+ LE edema Psychiatric: Pt behavior is normal without agitation  All otherwise neg per pt Lab Results  Component Value Date   WBC 6.1 05/05/2019   HGB 13.4 05/05/2019   HCT 40.1 05/05/2019   PLT 210.0 05/05/2019   GLUCOSE 97 05/05/2019   CHOL 153 05/05/2019   TRIG 152.0 (H) 05/05/2019   HDL 51.30 05/05/2019   LDLCALC 71 05/05/2019   ALT 9 05/05/2019   AST 14 05/05/2019   NA 138 05/05/2019   K 4.0 05/05/2019   CL 101 05/05/2019   CREATININE 1.06 05/05/2019   BUN 20 05/05/2019   CO2 29 05/05/2019   TSH 4.01 05/05/2019   INR 1.14 04/14/2017   HGBA1C 5.9 05/19/2008          Assessment & Plan:

## 2019-12-17 NOTE — Assessment & Plan Note (Signed)
C/w venous insufficiency, worsening liklye related to inreased amlodipine, for decrease as above, also add hct 25 qd

## 2019-12-22 ENCOUNTER — Encounter (HOSPITAL_COMMUNITY): Payer: Self-pay

## 2019-12-22 ENCOUNTER — Other Ambulatory Visit: Payer: Self-pay

## 2019-12-22 ENCOUNTER — Ambulatory Visit (HOSPITAL_COMMUNITY)
Admission: EM | Admit: 2019-12-22 | Discharge: 2019-12-22 | Disposition: A | Discharge status: Home / Self Care | Payer: PPO | Attending: Urgent Care | Admitting: Urgent Care

## 2019-12-22 DIAGNOSIS — R300 Dysuria: Secondary | ICD-10-CM | POA: Diagnosis not present

## 2019-12-22 DIAGNOSIS — N3001 Acute cystitis with hematuria: Secondary | ICD-10-CM | POA: Diagnosis not present

## 2019-12-22 LAB — POCT URINALYSIS DIP (DEVICE)
Bilirubin Urine: NEGATIVE
Glucose, UA: NEGATIVE mg/dL
Ketones, ur: NEGATIVE mg/dL
Nitrite: POSITIVE — AB
Protein, ur: 100 mg/dL — AB
Specific Gravity, Urine: 1.015 (ref 1.005–1.030)
Urobilinogen, UA: 0.2 mg/dL (ref 0.0–1.0)
pH: 6 (ref 5.0–8.0)

## 2019-12-22 MED ORDER — CEPHALEXIN 500 MG PO CAPS
500.0000 mg | ORAL_CAPSULE | Freq: Two times a day (BID) | ORAL | 0 refills | Status: DC
Start: 2019-12-22 — End: 2020-05-31

## 2019-12-22 NOTE — ED Provider Notes (Signed)
[place]   MRN: [id number] DOB: 07-01-1941  Subjective:   Alyssa Fernandez is a 79 y.o. female presenting for 2 day hx of dysuria, urinary urgency. Denies fever, n/v, abdominal pain, flank pain, hematuria.   No current facility-administered medications for this encounter.  Current Outpatient Medications:  .  ALPRAZolam (XANAX) 0.5 MG tablet, TAKE ONE TABLET TWICE DAILY AS NEEDED, Disp: 60 tablet, Rfl: 5 .  amLODipine (NORVASC) 5 MG tablet, Take 1 tablet (5 mg total) by mouth daily., Disp: 90 tablet, Rfl: 3 .  amphetamine-dextroamphetamine (ADDERALL) 10 MG tablet, TAKE ONE TABLET TWICE DAILY, Disp: 60 tablet, Rfl: 0 .  ARIPiprazole (ABILIFY) 5 MG tablet, Take 1 tablet (5 mg total) by mouth daily., Disp: 90 tablet, Rfl: 3 .  guaiFENesin (MUCINEX) 600 MG 12 hr tablet, Take 2 tablets (1,200 mg total) by mouth 2 (two) times daily as needed., Disp: 60 tablet, Rfl: 2 .  hydrochlorothiazide (HYDRODIURIL) 25 MG tablet, Take 1 tablet (25 mg total) by mouth daily., Disp: 90 tablet, Rfl: 3 .  HYDROcodone-acetaminophen (NORCO/VICODIN) 5-325 MG tablet, Take 1 tablet by mouth every 6 (six) hours as needed for moderate pain., Disp: 30 tablet, Rfl: 0 .  Hypromellose (ARTIFICIAL TEARS OP), Place 1 drop into both eyes daily as needed (dry eyes)., Disp: , Rfl:  .  labetalol (NORMODYNE) 300 MG tablet, Take 1 tablet (300 mg total) by mouth 2 (two) times daily., Disp: 180 tablet, Rfl: 3 .  potassium chloride SA (K-DUR,KLOR-CON) 20 MEQ tablet, TAKE ONE TABLET TWICE DAILY, Disp: 60 tablet, Rfl: 11 .  PREMARIN 0.625 MG tablet, TAKE ONE TABLET DAILY FOR 21 DAYS THEN DO NOT TAKE FOR 7 DAYS, Disp: 63 tablet, Rfl: 1 .  rosuvastatin (CRESTOR) 20 MG tablet, TAKE ONE TABLET EACH DAY, Disp: 90 tablet, Rfl: 3 .  sertraline (ZOLOFT) 100 MG tablet, TAKE ONE TABLET TWICE DAILY, Disp: 180 tablet, Rfl: 3 .  telmisartan (MICARDIS) 80 MG tablet, Take 1 tablet (80 mg total) by mouth daily., Disp: 90 tablet, Rfl: 3    Allergies  Allergen Reactions  . Codeine Swelling  . Sulfonamide Derivatives Swelling  . Zocor [Simvastatin] Other (See Comments)    Cognitive decrease    Past Medical History:  Diagnosis Date  . Anxiety   . Atherosclerosis of aorta ([place]) 08/01/2017  . 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 GERD   . Hyperlipidemia   . Hypertension   . HYPOTHYROIDISM   . INSOMNIA   . 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 OSTEOPENIA   . Polycystic kidney disease   . RENAL FAILURE   . VITAMIN D DEFICIENCY      Past Surgical History:  Procedure Laterality Date  . ABDOMINAL HYSTERECTOMY    . 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 bladder operation, but per sling procedure    . BUNIONECTOMY    . CERVICAL DISC SURGERY    . fx rt ankle    . left temporal bx artery-negative 2     . PARTIAL NEPHRECTOMY    . percutaneous drainage of liver abscess  05/06/2012    Family History  Problem Relation Age of Onset  . Stroke Mother     Social History   Tobacco Use  . Smoking status: Never Smoker  . Smokeless tobacco: Never Used  Substance Use Topics  . Alcohol use: No  . Drug use: No    ROS   Objective:  Vitals: BP 123/69 (BP Location: Left Arm)   Pulse 75   Temp 98.4 F (36.9 C) (Oral)   Resp 16   SpO2 96%   Physical Exam Constitutional:      General: She is not in acute distress.    Appearance: Normal appearance. She is well-developed and normal weight. She is not ill-appearing, toxic-appearing or diaphoretic.  HENT:     Head: Normocephalic and atraumatic.     Right Ear: External ear normal.     Left Ear: External ear normal.     Nose: Nose normal.     Mouth/Throat:     Mouth: Mucous membranes are moist.     Pharynx: Oropharynx is clear.  Eyes:     General: No scleral icterus.    Extraocular Movements: Extraocular movements intact.     Pupils: Pupils are equal, round, and reactive to light.  Cardiovascular:     Rate and Rhythm: Normal rate.   Pulmonary:     Effort: Pulmonary effort is normal.  Abdominal:     General: Bowel sounds are normal. There is no distension.     Palpations: Abdomen is soft. There is no mass.     Tenderness: There is no abdominal tenderness. There is no right CVA tenderness, left CVA tenderness, guarding or rebound.  Skin:    General: Skin is warm and dry.     Coloration: Skin is not pale.     Findings: No rash.  Neurological:     General: No focal deficit present.     Mental Status: She is alert and oriented to person, place, and time.  Psychiatric:        Mood and Affect: Mood normal.        Behavior: Behavior normal.        Thought Content: Thought content normal.        Judgment: Judgment normal.     Results for orders placed or performed during the hospital encounter of 12/22/19 (from the past 24 hour(s))  POCT urinalysis dip (device)     Status: Abnormal   Collection Time: 12/22/19 10:14 AM  Result Value Ref Range   Glucose, UA NEGATIVE NEGATIVE mg/dL   Bilirubin Urine NEGATIVE NEGATIVE   Ketones, ur NEGATIVE NEGATIVE mg/dL   Specific Gravity, Urine 1.015 1.005 - 1.030   Hgb urine dipstick LARGE (A) NEGATIVE   pH 6.0 5.0 - 8.0   Protein, ur 100 (A) NEGATIVE mg/dL   Urobilinogen, UA 0.2 0.0 - 1.0 mg/dL   Nitrite POSITIVE (A) NEGATIVE   Leukocytes,Ua LARGE (A) NEGATIVE    Assessment and Plan :   PDMP not reviewed this encounter.  1. Acute cystitis with hematuria   2. Dysuria     Start Keflex to cover for cystitis, urine culture pending.  Recommended patient hydrate very well with plain water and avoid sodas, juice. Counseled patient on potential for adverse effects with medications prescribed/recommended today, ER and return-to-clinic precautions discussed, patient verbalized understanding.    Jaynee Eagles, PA-C 12/22/19 1110

## 2019-12-22 NOTE — ED Triage Notes (Signed)
Pt presents to UC with burning when urinating x 2 days.

## 2019-12-24 LAB — URINE CULTURE: Culture: >=100000 — AB

## 2020-01-13 DIAGNOSIS — N816 Rectocele: Secondary | ICD-10-CM | POA: Diagnosis not present

## 2020-01-13 DIAGNOSIS — N8111 Cystocele, midline: Secondary | ICD-10-CM | POA: Diagnosis not present

## 2020-01-23 ENCOUNTER — Other Ambulatory Visit: Payer: Self-pay | Admitting: Internal Medicine

## 2020-01-23 NOTE — Telephone Encounter (Signed)
Done erx 

## 2020-03-22 ENCOUNTER — Other Ambulatory Visit: Payer: Self-pay | Admitting: Internal Medicine

## 2020-03-22 NOTE — Telephone Encounter (Signed)
Please refill as per office routine med refill policy (all routine meds refilled for 3 mo or monthly per pt preference up to one year from last visit, then month to month grace period for 3 mo, then further med refills will have to be denied)  

## 2020-05-11 ENCOUNTER — Other Ambulatory Visit: Payer: Self-pay | Admitting: Internal Medicine

## 2020-05-16 ENCOUNTER — Encounter (HOSPITAL_COMMUNITY): Payer: Self-pay

## 2020-05-16 ENCOUNTER — Emergency Department (HOSPITAL_COMMUNITY)
Admission: EM | Admit: 2020-05-16 | Discharge: 2020-05-17 | Disposition: A | Discharge status: Home / Self Care | Payer: PPO | Attending: Emergency Medicine | Admitting: Emergency Medicine

## 2020-05-16 ENCOUNTER — Other Ambulatory Visit: Payer: Self-pay

## 2020-05-16 ENCOUNTER — Emergency Department (HOSPITAL_COMMUNITY): Payer: PPO

## 2020-05-16 DIAGNOSIS — S51832S Puncture wound without foreign body of left forearm, sequela: Secondary | ICD-10-CM

## 2020-05-16 DIAGNOSIS — I129 Hypertensive chronic kidney disease with stage 1 through stage 4 chronic kidney disease, or unspecified chronic kidney disease: Secondary | ICD-10-CM | POA: Insufficient documentation

## 2020-05-16 DIAGNOSIS — Z79899 Other long term (current) drug therapy: Secondary | ICD-10-CM | POA: Diagnosis not present

## 2020-05-16 DIAGNOSIS — E039 Hypothyroidism, unspecified: Secondary | ICD-10-CM | POA: Diagnosis not present

## 2020-05-16 DIAGNOSIS — Z79890 Hormone replacement therapy (postmenopausal): Secondary | ICD-10-CM | POA: Diagnosis not present

## 2020-05-16 DIAGNOSIS — W228XXA Striking against or struck by other objects, initial encounter: Secondary | ICD-10-CM | POA: Insufficient documentation

## 2020-05-16 DIAGNOSIS — N183 Chronic kidney disease, stage 3 unspecified: Secondary | ICD-10-CM | POA: Diagnosis not present

## 2020-05-16 DIAGNOSIS — S59912A Unspecified injury of left forearm, initial encounter: Secondary | ICD-10-CM | POA: Diagnosis present

## 2020-05-16 DIAGNOSIS — S51812A Laceration without foreign body of left forearm, initial encounter: Secondary | ICD-10-CM

## 2020-05-16 HISTORY — DX: Puncture wound without foreign body of left forearm, sequela: S51.832S

## 2020-05-16 NOTE — ED Triage Notes (Signed)
Pt reports that she slipped on some leaves and cut her L forearm on a branch, pt has large deep laceration to forearm. Pt states that she had a branch in her arm but she pulled it out. Bleeding is controlled at this time. Pt thinks her tetanus is up to date.

## 2020-05-16 NOTE — ED Notes (Signed)
Applied wet to dry dressing.

## 2020-05-17 MED ORDER — LIDOCAINE HCL (PF) 1 % IJ SOLN
INTRAMUSCULAR | Status: AC
  Filled 2020-05-17: qty 30

## 2020-05-17 MED ORDER — LIDOCAINE HCL (PF) 1 % IJ SOLN
20.0000 mL | Freq: Once | INTRAMUSCULAR | Status: DC
  Filled 2020-05-17: qty 20

## 2020-05-17 NOTE — ED Provider Notes (Signed)
[facility] EMERGENCY DEPARTMENT Provider Note   CSN: [id number] Arrival date & time: 05/16/20  1909   History Chief Complaint  Patient presents with   Laceration    Alyssa Fernandez is a 79 y.o. female.  The history is provided by the patient.  Laceration She has history of hypertension, hyperlipidemia, polycystic kidney disease with stage III chronic kidney disease and comes in having injured her left forearm.  She states that she slipped and her left arm hit a tree branch causing a deep laceration.  She denies other injury.  She states she did not actually fall.  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    OSTEOPENIA    Polycystic kidney disease    RENAL FAILURE    VITAMIN D DEFICIENCY     Patient Active Problem List   Diagnosis Date Noted   Right wrist pain 11/09/2019   Wheezing 05/08/2019   Osteoporosis 01/30/2018   Atherosclerosis of aorta ([facility]) 08/01/2017   CKD (chronic kidney disease) stage 3, GFR 30-59 ml/min 08/01/2017   Acute pyelonephritis 04/13/2017   Chronic low back pain 05/04/2016   Easy bruising 12/02/2015   Memory loss 09/17/2015   Encounter for immunization 06/03/2015   Fatigue 11/25/2014   Degenerative joint disease 05/26/2014   Hives 04/08/2014   Dyspnea 02/03/2014   Peripheral neuropathy ([place]) 02/05/2013   Bilateral foot pain 11/20/2012   Vaginal itching 06/26/2012   Low blood pressure 06/19/2012   Liver abscess 06/19/2012   Normocytic anemia 05/07/2012   Hypokalemia 05/06/2012   Fever 05/06/2012   Dehydration 05/06/2012   Hepatic abscess 05/03/2012   Rash 11/20/2011   Preventative health care 11/16/2011   Anxiety 11/16/2011   Cervical spine  degeneration 11/16/2011   Essential hypertension 02/24/2010   DERMATITIS, ATOPIC 01/20/2010   Edema 12/28/2009   Vitamin D deficiency 11/10/2009   UNS ADVRS EFF OTH RX MEDICINAL&BIOLOGICAL SBSTNC 11/10/2009   TACHYCARDIA 10/05/2009   OTHER POSTSURGICAL STATUS OTHER 07/21/2009   POSTMENOPAUSAL SYNDROME 03/09/2009   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     Family History  Problem Relation Age of Onset   Stroke Mother     Social History   Tobacco Use   Smoking status: Never Smoker   Smokeless tobacco: Never Used  Vaping Use   Vaping Use: Never used  Substance Use Topics   Alcohol use: No   Drug use: No    Home Medications Prior to Admission medications  Medication Sig Start Date End Date Taking? Authorizing Provider  ALPRAZolam Duanne Moron) 0.5 MG tablet TAKE ONE TABLET TWICE DAILY AS NEEDED 11/26/19   Biagio Borg, MD  amLODipine (NORVASC) 5 MG tablet Take 1 tablet (5 mg total) by mouth daily. 12/17/19 12/16/20  Biagio Borg, MD  amphetamine-dextroamphetamine (ADDERALL) 10 MG tablet TAKE ONE TABLET TWICE DAILY 01/23/20   Biagio Borg, MD  ARIPiprazole (ABILIFY) 5 MG tablet Take 1 tablet (5 mg total) by mouth daily. 08/06/19   Biagio Borg, MD  cephALEXin (KEFLEX) 500 MG capsule Take 1 capsule (500 mg total) by mouth 2 (two) times daily. 12/22/19    Jaynee Eagles, PA-C  guaiFENesin (MUCINEX) 600 MG 12 hr tablet Take 2 tablets (1,200 mg total) by mouth 2 (two) times daily as needed. 11/05/19   Biagio Borg, MD  hydrochlorothiazide (HYDRODIURIL) 25 MG tablet Take 1 tablet (25 mg total) by mouth daily. 12/17/19   Biagio Borg, MD  HYDROcodone-acetaminophen (NORCO/VICODIN) 5-325 MG tablet Take 1 tablet by mouth every 6 (six) hours as needed for moderate pain. 04/07/19   Biagio Borg, MD  Hypromellose (ARTIFICIAL TEARS OP) Place 1 drop into both eyes daily as needed (dry eyes).    [provider]  labetalol (NORMODYNE) 300 MG tablet TAKE ONE TABLET TWICE DAILY 03/23/20   Biagio Borg, MD  potassium chloride SA (K-DUR,KLOR-CON) 20 MEQ tablet TAKE ONE TABLET TWICE DAILY 05/22/17   Biagio Borg, MD  PREMARIN 0.625 MG tablet TAKE ONE TABLET DAILY FOR 21 DAYS THEN DO NOT TAKE FOR 7 DAYS 03/23/20   Biagio Borg, MD  rosuvastatin (CRESTOR) 20 MG tablet TAKE ONE TABLET DAILY 05/11/20   Biagio Borg, MD  sertraline (ZOLOFT) 100 MG tablet TAKE ONE TABLET TWICE DAILY 11/26/19   Biagio Borg, MD  telmisartan (MICARDIS) 80 MG tablet TAKE ONE TABLET DAILY 05/11/20   Biagio Borg, MD    Allergies    Codeine, Sulfonamide derivatives, and Zocor [simvastatin]  Review of Systems   Review of Systems  All other systems reviewed and are negative.   Physical Exam Updated Vital Signs BP 113/70 (BP Location: Right Arm)    Pulse 68    Temp 98.3 F (36.8 C) (Oral)    Resp 16    SpO2 99%   Physical Exam Vitals and nursing note reviewed.   79 year old female, resting comfortably and in no acute distress. Vital signs are normal. Oxygen saturation is 99%, which is normal. Head is normocephalic and atraumatic. PERRLA, EOMI. Oropharynx is clear. Neck is nontender and supple without adenopathy or JVD. Back is nontender and there is no CVA tenderness. Lungs are clear without rales, wheezes, or rhonchi. Chest is nontender. Heart has regular rate and rhythm  without murmur. Abdomen is soft, flat, nontender without masses or hepatosplenomegaly and peristalsis is normoactive. Extremities: Deep V-shaped laceration present on the volar surface of the left forearm.  Distal neurovascular exam is intact with strong pulses, prompt capillary refill, normal sensation, normal use of the intrinsic muscles of the hand. Skin is warm and dry without rash. Neurologic: Mental status is normal, cranial nerves are intact, there are no motor or sensory deficits.   ED Results / Procedures / Treatments    Radiology DG Forearm Left  Result Date: 05/16/2020 CLINICAL DATA:  Laceration EXAM: LEFT FOREARM - 2 VIEW COMPARISON:  None. FINDINGS: No acute displaced fracture or dislocation. There is a large soft tissue defect involving the  forearm. There is no radiopaque foreign body. IMPRESSION: No acute displaced fracture or dislocation. No radiopaque foreign body. Large soft tissue defect involving the forearm. Electronically Signed   By: Constance Holster M.D.   On: 05/16/2020 20:49    Procedures .Marland KitchenLaceration Repair  Date/Time: 05/17/2020 4:36 AM Performed by: Delora Fuel, MD Authorized by: Delora Fuel, MD   Consent:    Consent obtained:  Verbal   Consent given by:  Patient   Risks discussed:  Infection, pain, poor cosmetic result and poor wound healing (Skin tearing)   Alternatives discussed:  No treatment Anesthesia (see MAR for exact dosages):    Anesthesia method:  Local infiltration   Local anesthetic:  Lidocaine 1% w/o epi Laceration details:    Location:  Shoulder/arm   Shoulder/arm location:  L lower arm   Length (cm):  18   Depth (mm):  10 Repair type:    Repair type:  Intermediate Pre-procedure details:    Preparation:  Patient was prepped and draped in usual sterile fashion and imaging obtained to evaluate for foreign bodies Exploration:    Hemostasis achieved with:  Direct pressure   Wound exploration: entire depth of wound probed and  visualized     Wound extent: no foreign bodies/material noted, no muscle damage noted, no tendon damage noted and no vascular damage noted     Contaminated: no   Treatment:    Area cleansed with:  Saline   Amount of cleaning:  Standard Skin repair:    Repair method:  Sutures   Suture size:  3-0   Suture material:  Nylon   Suture technique: Two sets of running sutures, one simple interrupted suture. Approximation:    Approximation:  Close Post-procedure details:    Dressing:  Antibiotic ointment and sterile dressing   Patient tolerance of procedure:  Tolerated well, no immediate complications    Medications Ordered in ED Medications  lidocaine (PF) (XYLOCAINE) 1 % injection 20 mL (has no administration in time range)  lidocaine (PF) (XYLOCAINE) 1 % injection (has no administration in time range)    ED Course  I have reviewed the triage vital signs and the nursing notes.  Pertinent imaging results that were available during my care of the patient were reviewed by me and considered in my medical decision making (see chart for details).  MDM Rules/Calculators/A&P Deep V-shaped laceration of the flexor surface of the left forearm.  X-rays showed no foreign body.  Wound was anesthetized and explored and did not violate the fascia.  Skin was noted to be somewhat thin, so thick suture material was used and placed far from the skin edges to try to get good retention.  The flap at the V was carefully approximated.  Old records are reviewed, and last tetanus immunization was in 2018.  She is discharged with instructions have sutures removed in 10-14 days.  Final Clinical Impression(s) / ED Diagnoses Final diagnoses:  Laceration of left forearm, initial encounter    Rx / DC Orders ED Discharge Orders    None       Delora Fuel, MD 35/57/32 [phone]

## 2020-05-26 ENCOUNTER — Other Ambulatory Visit: Payer: Self-pay

## 2020-05-26 ENCOUNTER — Encounter: Payer: Self-pay | Admitting: Internal Medicine

## 2020-05-26 ENCOUNTER — Ambulatory Visit (INDEPENDENT_AMBULATORY_CARE_PROVIDER_SITE_OTHER): Payer: PPO | Admitting: Internal Medicine

## 2020-05-26 DIAGNOSIS — S51832S Puncture wound without foreign body of left forearm, sequela: Secondary | ICD-10-CM | POA: Insufficient documentation

## 2020-05-26 MED ORDER — MUPIROCIN 2 % EX OINT: TOPICAL_OINTMENT | CUTANEOUS | 0 refills | Status: DC

## 2020-05-26 MED ORDER — DOXYCYCLINE HYCLATE 100 MG PO TABS: 100.0000 mg | ORAL_TABLET | Freq: Two times a day (BID) | ORAL | 0 refills | Status: DC

## 2020-05-26 NOTE — Progress Notes (Signed)
Subjective:  Patient ID: Alyssa Fernandez, female    DOB: 1941-03-01  Age: 79 y.o. MRN: [id number]  CC: Suture / Staple Removal   HPI Alyssa Fernandez presents for her L forearm wound 9/26 -- not healing.  Some redness has developed over past several days.  There is mild yellowish discharge.  No fever      Outpatient Medications Prior to Visit  Medication Sig Dispense Refill  . ALPRAZolam (XANAX) 0.5 MG tablet TAKE ONE TABLET TWICE DAILY AS NEEDED (Patient taking differently: Take 0.5 mg by mouth 2 (two) times daily as needed for anxiety. TAKE ONE TABLET TWICE DAILY AS NEEDED) 60 tablet 5  . amLODipine (NORVASC) 5 MG tablet Take 1 tablet (5 mg total) by mouth daily. 90 tablet 3  . amphetamine-dextroamphetamine (ADDERALL) 10 MG tablet TAKE ONE TABLET TWICE DAILY 60 tablet 0  . ARIPiprazole (ABILIFY) 5 MG tablet Take 1 tablet (5 mg total) by mouth daily. 90 tablet 3  . hydrochlorothiazide (HYDRODIURIL) 25 MG tablet Take 1 tablet (25 mg total) by mouth daily. 90 tablet 3  . HYDROcodone-acetaminophen (NORCO/VICODIN) 5-325 MG tablet Take 1 tablet by mouth every 6 (six) hours as needed for moderate pain. 30 tablet 0  . Hypromellose (ARTIFICIAL TEARS OP) Place 1 drop into both eyes daily as needed (dry eyes).    . labetalol (NORMODYNE) 300 MG tablet TAKE ONE TABLET TWICE DAILY 180 tablet 3  . potassium chloride SA (K-DUR,KLOR-CON) 20 MEQ tablet TAKE ONE TABLET TWICE DAILY 60 tablet 11  . PREMARIN 0.625 MG tablet TAKE ONE TABLET DAILY FOR 21 DAYS THEN DO NOT TAKE FOR 7 DAYS 63 tablet 1  . rosuvastatin (CRESTOR) 20 MG tablet TAKE ONE TABLET DAILY 90 tablet 3  . sertraline (ZOLOFT) 100 MG tablet TAKE ONE TABLET TWICE DAILY 180 tablet 3  . telmisartan (MICARDIS) 80 MG tablet TAKE ONE TABLET DAILY 90 tablet 3  . cephALEXin (KEFLEX) 500 MG capsule Take 1 capsule (500 mg total) by mouth 2 (two) times daily. (Patient not taking: Reported on 05/26/2020) 14 capsule 0  . guaiFENesin (MUCINEX) 600 MG 12 hr  tablet Take 2 tablets (1,200 mg total) by mouth 2 (two) times daily as needed. (Patient not taking: Reported on 05/26/2020) 60 tablet 2   No facility-administered medications prior to visit.    ROS: Review of Systems  Constitutional: Negative for chills, fatigue and fever.  Skin: Positive for color change and wound.  Psychiatric/Behavioral: The patient is not nervous/anxious.     Objective:  BP 120/76 (BP Location: Right Arm, Patient Position: Sitting, Cuff Size: Normal)   Pulse 60   Temp 98.4 F (36.9 C) (Oral)   Ht 5\' 3"  (1.6 m)   Wt 137 lb (62.1 kg)   SpO2 94%   BMI 24.27 kg/m   BP Readings from Last 3 Encounters:  05/26/20 120/76  05/17/20 113/70  12/22/19 123/69    Wt Readings from Last 3 Encounters:  05/26/20 137 lb (62.1 kg)  12/17/19 150 lb (68 kg)  11/07/19 150 lb (68 kg)    Physical Exam Constitutional:      Appearance: She is not ill-appearing or toxic-appearing.  Skin:    Findings: Erythema present.  Neurological:     Mental Status: She is alert and oriented to person, place, and time.   Wound-please see the picture above    A total time of >25 minutes was spent preparing to see the patient, reviewing records.  Also, obtaining history and performing physical  exam.  Additionally, counseling the patient regarding your wound care Finally, time spent: Cleaning and dressing her wound, educating the patient regarding wound care, signs of infection etc.  Coban applied  Lab Results  Component Value Date   WBC 6.1 05/05/2019   HGB 13.4 05/05/2019   HCT 40.1 05/05/2019   PLT 210.0 05/05/2019   GLUCOSE 97 05/05/2019   CHOL 153 05/05/2019   TRIG 152.0 (H) 05/05/2019   HDL 51.30 05/05/2019   LDLCALC 71 05/05/2019   ALT 9 05/05/2019   AST 14 05/05/2019   NA 138 05/05/2019   K 4.0 05/05/2019   CL 101 05/05/2019   CREATININE 1.06 05/05/2019   BUN 20 05/05/2019   CO2 29 05/05/2019   TSH 4.01 05/05/2019   INR 1.14 04/14/2017   HGBA1C 5.9 05/19/2008     DG Forearm Left  Result Date: 05/16/2020 CLINICAL DATA:  Laceration EXAM: LEFT FOREARM - 2 VIEW COMPARISON:  None. FINDINGS: No acute displaced fracture or dislocation. There is a large soft tissue defect involving the forearm. There is no radiopaque foreign body. IMPRESSION: No acute displaced fracture or dislocation. No radiopaque foreign body. Large soft tissue defect involving the forearm. Electronically Signed   By: Constance Holster M.D.   On: 05/16/2020 20:49    Assessment & Plan:   There are no diagnoses linked to this encounter.   No orders of the defined types were placed in this encounter.    Follow-up: No follow-ups on file.  Walker Kehr, MD

## 2020-05-26 NOTE — Assessment & Plan Note (Signed)
Likely cellulitis Will delay suture removal till Monday Given Doxy x 10 d Bactroban, Telfa, Coban

## 2020-05-31 ENCOUNTER — Ambulatory Visit (INDEPENDENT_AMBULATORY_CARE_PROVIDER_SITE_OTHER): Payer: PPO | Admitting: Family

## 2020-05-31 ENCOUNTER — Encounter: Payer: Self-pay | Admitting: Family

## 2020-05-31 ENCOUNTER — Other Ambulatory Visit: Payer: Self-pay

## 2020-05-31 VITALS — BP 102/80 | HR 51 | Temp 98.5°F | Ht 63.0 in | Wt 137.0 lb

## 2020-05-31 DIAGNOSIS — T8141XA Infection following a procedure, superficial incisional surgical site, initial encounter: Secondary | ICD-10-CM

## 2020-05-31 DIAGNOSIS — L03114 Cellulitis of left upper limb: Secondary | ICD-10-CM | POA: Diagnosis not present

## 2020-05-31 NOTE — Progress Notes (Signed)
Alyssa Fernandez is a 79 y.o. female with the following history as recorded in [employer]:  Patient Active Problem List   Diagnosis Date Noted  . Penetrating forearm wound, left, sequela 05/26/2020  . Right wrist pain 11/09/2019  . Wheezing 05/08/2019  . Osteoporosis 01/30/2018  . Atherosclerosis of aorta ([place]) 08/01/2017  . CKD (chronic kidney disease) stage 3, GFR 30-59 ml/min ([facility]) 08/01/2017  . Acute pyelonephritis 04/13/2017  . Chronic low back pain 05/04/2016  . Easy bruising 12/02/2015  . Memory loss 09/17/2015  . Encounter for immunization 06/03/2015  . Fatigue 11/25/2014  . Degenerative joint disease 05/26/2014  . Hives 04/08/2014  . Dyspnea 02/03/2014  . Peripheral neuropathy ([place]) 02/05/2013  . Bilateral foot pain 11/20/2012  . Vaginal itching 06/26/2012  . Low blood pressure 06/19/2012  . Liver abscess 06/19/2012  . Normocytic anemia 05/07/2012  . Hypokalemia 05/06/2012  . Fever 05/06/2012  . Dehydration 05/06/2012  . Hepatic abscess 05/03/2012  . Rash 11/20/2011  . Preventative health care 11/16/2011  . Anxiety 11/16/2011  . 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 Essential hypertension 02/24/2010  . DERMATITIS, ATOPIC 01/20/2010  . Edema 12/28/2009  . Vitamin D deficiency 11/10/2009  . UNS ADVRS EFF OTH RX MEDICINAL&BIOLOGICAL SBSTNC 11/10/2009  . TACHYCARDIA 10/05/2009  . OTHER POSTSURGICAL STATUS OTHER 07/21/2009  . POSTMENOPAUSAL SYNDROME 03/09/2009  . 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 INSOMNIA 06/17/2008  . Hypothyroidism 02/12/2008  . Renal failure 02/12/2008  . Polycystic kidney 02/12/2008  . GERD 06/12/2007  . Disorder of bone and cartilage 06/12/2007  . Hyperlipidemia 03/25/2007    Current Outpatient Medications  Medication Sig Dispense Refill  . ALPRAZolam (XANAX) 0.5 MG tablet TAKE ONE TABLET TWICE DAILY AS NEEDED (Patient taking differently: Take 0.5 mg by mouth 2 (two) times daily as needed for anxiety. TAKE ONE  TABLET TWICE DAILY AS NEEDED) 60 tablet 5  . amLODipine (NORVASC) 5 MG tablet Take 1 tablet (5 mg total) by mouth daily. 90 tablet 3  . amphetamine-dextroamphetamine (ADDERALL) 10 MG tablet TAKE ONE TABLET TWICE DAILY 60 tablet 0  . ARIPiprazole (ABILIFY) 5 MG tablet Take 1 tablet (5 mg total) by mouth daily. 90 tablet 3  . doxycycline (VIBRA-TABS) 100 MG tablet Take 1 tablet (100 mg total) by mouth 2 (two) times daily. 20 tablet 0  . hydrochlorothiazide (HYDRODIURIL) 25 MG tablet Take 1 tablet (25 mg total) by mouth daily. 90 tablet 3  . HYDROcodone-acetaminophen (NORCO/VICODIN) 5-325 MG tablet Take 1 tablet by mouth every 6 (six) hours as needed for moderate pain. 30 tablet 0  . Hypromellose (ARTIFICIAL TEARS OP) Place 1 drop into both eyes daily as needed (dry eyes).    . labetalol (NORMODYNE) 300 MG tablet TAKE ONE TABLET TWICE DAILY 180 tablet 3  . mupirocin ointment (BACTROBAN) 2 % On leg wound w/dressing change qd or bid 30 g 0  . potassium chloride SA (K-DUR,KLOR-CON) 20 MEQ tablet TAKE ONE TABLET TWICE DAILY 60 tablet 11  . PREMARIN 0.625 MG tablet TAKE ONE TABLET DAILY FOR 21 DAYS THEN DO NOT TAKE FOR 7 DAYS 63 tablet 1  . rosuvastatin (CRESTOR) 20 MG tablet TAKE ONE TABLET DAILY 90 tablet 3  . sertraline (ZOLOFT) 100 MG tablet TAKE ONE TABLET TWICE DAILY 180 tablet 3  . telmisartan (MICARDIS) 80 MG tablet TAKE ONE TABLET DAILY 90 tablet 3   No current facility-administered medications for this visit.    Allergies: Codeine, Sulfonamide derivatives, and Zocor [simvastatin]  Past Medical  History:  Diagnosis Date  . Anxiety   . Atherosclerosis of aorta ([place]) 08/01/2017  . 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 GERD   . Hyperlipidemia   . Hypertension   . HYPOTHYROIDISM   . INSOMNIA   . 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 OSTEOPENIA   . Polycystic kidney disease   . RENAL FAILURE   . VITAMIN D DEFICIENCY     Past Surgical History:  Procedure  Laterality Date  . ABDOMINAL HYSTERECTOMY    . 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 bladder operation, but per sling procedure    . BUNIONECTOMY    . CERVICAL DISC SURGERY    . fx rt ankle    . left temporal bx artery-negative 2     . PARTIAL NEPHRECTOMY    . percutaneous drainage of liver abscess  05/06/2012    Family History  Problem Relation Age of Onset  . Stroke Mother     Social History   Tobacco Use  . Smoking status: Never Smoker  . Smokeless tobacco: Never Used  Substance Use Topics  . Alcohol use: No    Subjective:  Was scheduled to have sutures removed on 10/6 of left forearm but found to have secondary cellulitis; started on Doxycycline and told to come back today for removal; notes the area is still red but improving;  Sutures were placed on 9/26//21;    Objective:  Vitals:   05/31/20 1117  BP: 102/80  Pulse: (!) 51  Temp: 98.5 F (36.9 C)  TempSrc: Oral  SpO2: 93%  Weight: 137 lb (62.1 kg)  Height: 5\' 3"  (1.6 m)    General: Well developed, well nourished, in no acute distress  Skin : Warm and dry. Stitches removed from left forearm with no difficulty; surrounding erythema is present but areas of concern at last visit have scabbed- no pustular drainage noted;  Head: Normocephalic and atraumatic  Lungs: Respirations unlabored;  Neurologic: Alert and oriented; speech intact; face symmetrical; moves all extremities well; CNII-XII intact without focal deficit   Assessment:  1. Cellulitis of left upper extremity   2. Postoperative stitch abscess     Plan:  Stitches removed with no difficulty; area dressed and covered; she will continue to keep covered and change at least 2x per day;  Finish Doxycycline prescribed last week- return to office on Friday for a wound re-check;  This visit occurred during the SARS-CoV-2 public health emergency.  Safety protocols were in place, including screening questions prior to the visit, additional usage of staff PPE, and  extensive cleaning of exam room while observing appropriate contact time as indicated for disinfecting solutions.     No follow-ups on file.  No orders of the defined types were placed in this encounter.   Requested Prescriptions    No prescriptions requested or ordered in this encounter

## 2020-06-01 DIAGNOSIS — H903 Sensorineural hearing loss, bilateral: Secondary | ICD-10-CM | POA: Diagnosis not present

## 2020-06-04 ENCOUNTER — Ambulatory Visit (INDEPENDENT_AMBULATORY_CARE_PROVIDER_SITE_OTHER): Payer: PPO | Admitting: Family

## 2020-06-04 ENCOUNTER — Other Ambulatory Visit: Payer: Self-pay

## 2020-06-04 VITALS — BP 112/78 | HR 60 | Temp 98.2°F | Ht 63.0 in | Wt 139.8 lb

## 2020-06-04 DIAGNOSIS — L03114 Cellulitis of left upper limb: Secondary | ICD-10-CM | POA: Diagnosis not present

## 2020-06-04 NOTE — Progress Notes (Signed)
Alyssa Fernandez is a 79 y.o. female with the following history as recorded in [employer]:  Patient Active Problem List   Diagnosis Date Noted  . Penetrating forearm wound, left, sequela 05/26/2020  . Right wrist pain 11/09/2019  . Wheezing 05/08/2019  . Osteoporosis 01/30/2018  . Atherosclerosis of aorta ([place]) 08/01/2017  . CKD (chronic kidney disease) stage 3, GFR 30-59 ml/min (HCC) 08/01/2017  . Acute pyelonephritis 04/13/2017  . Chronic low back pain 05/04/2016  . Easy bruising 12/02/2015  . Memory loss 09/17/2015  . Encounter for immunization 06/03/2015  . Fatigue 11/25/2014  . Degenerative joint disease 05/26/2014  . Hives 04/08/2014  . Dyspnea 02/03/2014  . Peripheral neuropathy ([place]) 02/05/2013  . Bilateral foot pain 11/20/2012  . Vaginal itching 06/26/2012  . Low blood pressure 06/19/2012  . Liver abscess 06/19/2012  . Normocytic anemia 05/07/2012  . Hypokalemia 05/06/2012  . Fever 05/06/2012  . Dehydration 05/06/2012  . Hepatic abscess 05/03/2012  . Rash 11/20/2011  . Preventative health care 11/16/2011  . Anxiety 11/16/2011  . 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 Essential hypertension 02/24/2010  . DERMATITIS, ATOPIC 01/20/2010  . Edema 12/28/2009  . Vitamin D deficiency 11/10/2009  . UNS ADVRS EFF OTH RX MEDICINAL&BIOLOGICAL SBSTNC 11/10/2009  . TACHYCARDIA 10/05/2009  . OTHER POSTSURGICAL STATUS OTHER 07/21/2009  . POSTMENOPAUSAL SYNDROME 03/09/2009  . 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 INSOMNIA 06/17/2008  . Hypothyroidism 02/12/2008  . Renal failure 02/12/2008  . Polycystic kidney 02/12/2008  . GERD 06/12/2007  . Disorder of bone and cartilage 06/12/2007  . Hyperlipidemia 03/25/2007    Current Outpatient Medications  Medication Sig Dispense Refill  . ALPRAZolam (XANAX) 0.5 MG tablet TAKE ONE TABLET TWICE DAILY AS NEEDED (Patient taking differently: Take 0.5 mg by mouth 2 (two) times daily as needed for anxiety. TAKE ONE  TABLET TWICE DAILY AS NEEDED) 60 tablet 5  . amLODipine (NORVASC) 5 MG tablet Take 1 tablet (5 mg total) by mouth daily. 90 tablet 3  . amphetamine-dextroamphetamine (ADDERALL) 10 MG tablet TAKE ONE TABLET TWICE DAILY 60 tablet 0  . ARIPiprazole (ABILIFY) 5 MG tablet Take 1 tablet (5 mg total) by mouth daily. 90 tablet 3  . doxycycline (VIBRA-TABS) 100 MG tablet Take 1 tablet (100 mg total) by mouth 2 (two) times daily. 20 tablet 0  . hydrochlorothiazide (HYDRODIURIL) 25 MG tablet Take 1 tablet (25 mg total) by mouth daily. 90 tablet 3  . HYDROcodone-acetaminophen (NORCO/VICODIN) 5-325 MG tablet Take 1 tablet by mouth every 6 (six) hours as needed for moderate pain. 30 tablet 0  . Hypromellose (ARTIFICIAL TEARS OP) Place 1 drop into both eyes daily as needed (dry eyes).    . labetalol (NORMODYNE) 300 MG tablet TAKE ONE TABLET TWICE DAILY 180 tablet 3  . mupirocin ointment (BACTROBAN) 2 % On leg wound w/dressing change qd or bid 30 g 0  . potassium chloride SA (K-DUR,KLOR-CON) 20 MEQ tablet TAKE ONE TABLET TWICE DAILY 60 tablet 11  . PREMARIN 0.625 MG tablet TAKE ONE TABLET DAILY FOR 21 DAYS THEN DO NOT TAKE FOR 7 DAYS 63 tablet 1  . rosuvastatin (CRESTOR) 20 MG tablet TAKE ONE TABLET DAILY 90 tablet 3  . sertraline (ZOLOFT) 100 MG tablet TAKE ONE TABLET TWICE DAILY 180 tablet 3  . telmisartan (MICARDIS) 80 MG tablet TAKE ONE TABLET DAILY 90 tablet 3   No current facility-administered medications for this visit.    Allergies: Codeine, Sulfonamide derivatives, and Zocor [simvastatin]  Past Medical  History:  Diagnosis Date  . Anxiety   . Atherosclerosis of aorta ([place]) 08/01/2017  . 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 GERD   . Hyperlipidemia   . Hypertension   . HYPOTHYROIDISM   . INSOMNIA   . 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 OSTEOPENIA   . Polycystic kidney disease   . RENAL FAILURE   . VITAMIN D DEFICIENCY     Past Surgical History:  Procedure  Laterality Date  . ABDOMINAL HYSTERECTOMY    . 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 bladder operation, but per sling procedure    . BUNIONECTOMY    . CERVICAL DISC SURGERY    . fx rt ankle    . left temporal bx artery-negative 2     . PARTIAL NEPHRECTOMY    . percutaneous drainage of liver abscess  05/06/2012    Family History  Problem Relation Age of Onset  . Stroke Mother     Social History   Tobacco Use  . Smoking status: Never Smoker  . Smokeless tobacco: Never Used  Substance Use Topics  . Alcohol use: No    Subjective:  4 day re-check on left forearm cellulitis; sutures were removed on Monday; has been using topical Bactroban/ finished Doxycycline; patient is pleased with response- having no further pain;   Objective:  Vitals:   06/04/20 1032  BP: 112/78  Pulse: 60  Temp: 98.2 F (36.8 C)  SpO2: 94%  Weight: 139 lb 12.8 oz (63.4 kg)  Height: 5\' 3"  (1.6 m)    General: Well developed, well nourished, in no acute distress  Skin : Warm and dry. Healing wound on left forearm; nice scabbed area- no streaking or pustular drainage noted;  Head: Normocephalic and atraumatic  Lungs: Respirations unlabored;  Neurologic: Alert and oriented; speech intact; face symmetrical; moves all extremities well; CNII-XII intact without focal deficit   Assessment:   1. Cellulitis of left upper extremity     Plan:  Healing nicely- no concerns; she will continue to use topical Bactroban as prescribed earlier this week; follow up if area becomes painful or streaking develops; Otherwise, she needs to schedule for CPE with her PCP in the next few months;  This visit occurred during the SARS-CoV-2 public health emergency.  Safety protocols were in place, including screening questions prior to the visit, additional usage of staff PPE, and extensive cleaning of exam room while observing appropriate contact time as indicated for disinfecting solutions.      No follow-ups on file.  No orders of  the defined types were placed in this encounter.   Requested Prescriptions    No prescriptions requested or ordered in this encounter

## 2020-06-08 ENCOUNTER — Ambulatory Visit: Payer: PPO | Admitting: Internal Medicine

## 2020-08-09 ENCOUNTER — Other Ambulatory Visit: Payer: Self-pay | Admitting: Internal Medicine

## 2020-08-23 ENCOUNTER — Other Ambulatory Visit: Payer: Self-pay | Admitting: Internal Medicine

## 2020-09-23 ENCOUNTER — Ambulatory Visit (HOSPITAL_COMMUNITY)
Admission: EM | Admit: 2020-09-23 | Discharge: 2020-09-23 | Disposition: A | Discharge status: Home / Self Care | Payer: PPO

## 2020-09-23 ENCOUNTER — Encounter (HOSPITAL_COMMUNITY): Payer: Self-pay

## 2020-09-23 ENCOUNTER — Other Ambulatory Visit: Payer: Self-pay

## 2020-09-23 ENCOUNTER — Emergency Department (HOSPITAL_COMMUNITY)
Admission: EM | Admit: 2020-09-23 | Discharge: 2020-09-23 | Disposition: A | Discharge status: Home / Self Care | Payer: PPO | Attending: Emergency Medicine | Admitting: Emergency Medicine

## 2020-09-23 DIAGNOSIS — I10 Essential (primary) hypertension: Secondary | ICD-10-CM

## 2020-09-23 DIAGNOSIS — I129 Hypertensive chronic kidney disease with stage 1 through stage 4 chronic kidney disease, or unspecified chronic kidney disease: Secondary | ICD-10-CM | POA: Diagnosis not present

## 2020-09-23 DIAGNOSIS — Z79899 Other long term (current) drug therapy: Secondary | ICD-10-CM | POA: Diagnosis not present

## 2020-09-23 DIAGNOSIS — N183 Chronic kidney disease, stage 3 unspecified: Secondary | ICD-10-CM | POA: Diagnosis not present

## 2020-09-23 DIAGNOSIS — R040 Epistaxis: Secondary | ICD-10-CM

## 2020-09-23 DIAGNOSIS — E039 Hypothyroidism, unspecified: Secondary | ICD-10-CM | POA: Insufficient documentation

## 2020-09-23 DIAGNOSIS — J069 Acute upper respiratory infection, unspecified: Secondary | ICD-10-CM

## 2020-09-23 DIAGNOSIS — F4321 Adjustment disorder with depressed mood: Secondary | ICD-10-CM

## 2020-09-23 LAB — COMPREHENSIVE METABOLIC PANEL
ALT: 11 U/L (ref 0–44)
AST: 16 U/L (ref 15–41)
Albumin: 3.8 g/dL (ref 3.5–5.0)
Alkaline Phosphatase: 52 U/L (ref 38–126)
Anion gap: 10 (ref 5–15)
BUN: 16 mg/dL (ref 8–23)
CO2: 26 mmol/L (ref 22–32)
Calcium: 9.4 mg/dL (ref 8.9–10.3)
Chloride: 103 mmol/L (ref 98–111)
Creatinine, Ser: 0.98 mg/dL (ref 0.44–1.00)
GFR, Estimated: 58 mL/min — ABNORMAL LOW (ref >60)
Glucose, Bld: 97 mg/dL (ref 70–99)
Potassium: 3.5 mmol/L (ref 3.5–5.1)
Sodium: 139 mmol/L (ref 135–145)
Total Bilirubin: 0.4 mg/dL (ref 0.3–1.2)
Total Protein: 6.8 g/dL (ref 6.5–8.1)

## 2020-09-23 LAB — CBC WITH DIFFERENTIAL/PLATELET
Abs Immature Granulocytes: 0.02 10*3/uL (ref 0.00–0.07)
Basophils Absolute: 0 10*3/uL (ref 0.0–0.1)
Basophils Relative: 1 %
Eosinophils Absolute: 0.4 10*3/uL (ref 0.0–0.5)
Eosinophils Relative: 8 %
HCT: 41.4 % (ref 36.0–46.0)
Hemoglobin: 13.7 g/dL (ref 12.0–15.0)
Immature Granulocytes: 0 %
Lymphocytes Relative: 23 %
Lymphs Abs: 1.3 10*3/uL (ref 0.7–4.0)
MCH: 29.6 pg (ref 26.0–34.0)
MCHC: 33.1 g/dL (ref 30.0–36.0)
MCV: 89.4 fL (ref 80.0–100.0)
Monocytes Absolute: 0.4 10*3/uL (ref 0.1–1.0)
Monocytes Relative: 7 %
Neutro Abs: 3.5 10*3/uL (ref 1.7–7.7)
Neutrophils Relative %: 61 %
Platelets: 237 10*3/uL (ref 150–400)
RBC: 4.63 MIL/uL (ref 3.87–5.11)
RDW: 12.9 % (ref 11.5–15.5)
WBC: 5.6 10*3/uL (ref 4.0–10.5)
nRBC: 0 % (ref 0.0–0.2)

## 2020-09-23 MED ORDER — AMLODIPINE BESYLATE 5 MG PO TABS
5.0000 mg | ORAL_TABLET | Freq: Once | ORAL | Status: AC
  Administered 2020-09-23: 5 mg via ORAL
  Filled 2020-09-23: qty 1

## 2020-09-23 MED ORDER — LABETALOL HCL 200 MG PO TABS
100.0000 mg | ORAL_TABLET | Freq: Once | ORAL | Status: AC
  Administered 2020-09-23: 100 mg via ORAL
  Filled 2020-09-23: qty 1

## 2020-09-23 MED ORDER — LIDOCAINE-EPINEPHRINE 1 %-1:100000 IJ SOLN
10.0000 mL | Freq: Once | INTRAMUSCULAR | Status: AC
  Administered 2020-09-23: 10 mL
  Filled 2020-09-23: qty 1

## 2020-09-23 MED ORDER — HYDROCHLOROTHIAZIDE 25 MG PO TABS
25.0000 mg | ORAL_TABLET | Freq: Every day | ORAL | Status: DC
  Administered 2020-09-23: 25 mg via ORAL
  Filled 2020-09-23: qty 1

## 2020-09-23 MED ORDER — OXYMETAZOLINE HCL 0.05 % NA SOLN
1.0000 | Freq: Once | NASAL | Status: AC
  Administered 2020-09-23: 1 via NASAL
  Filled 2020-09-23: qty 30

## 2020-09-23 NOTE — ED Triage Notes (Signed)
Pt arrives to ED from UC with nose bleed, pt also hypertensive did not take her bp medication.   220/116 72HR

## 2020-09-23 NOTE — ED Notes (Signed)
Patient is being discharged from the Urgent Care and sent to the Emergency Department via pov by daughter . Per Marin Roberts, PA, patient is in need of higher level of care due to HTN and epistaxis. Patient is aware and verbalizes understanding of plan of care.  Vitals:   09/23/20 1150  BP: (!) 208/11  Pulse: 71  Resp: 15  Temp: 98.2 F (36.8 C)  SpO2: 96%

## 2020-09-23 NOTE — ED Provider Notes (Signed)
[facility] Provider Note   CSN: [id number] Arrival date & time: 09/23/20  1310     History Chief Complaint  Patient presents with  . Epistaxis    Alyssa Fernandez is a 80 y.o. female who presents with concern for intermittent profuse bleeding from the left nostril x24 hours.  Patient states she is pressure medication today, she presents to the emergency department from urgent care for evaluation and higher level of care.  Patient states that her husband died 2 weeks ago and she has been feeling quite stressed since that time.  She presents with her cousin, Alyssa Fernandez, at the bedside.  Blood pressure at the urgent care was 222/114, and was for this reason she was sent to the emergency department.  Patient states that last night she did intermittently feel the blood running down her throat as she was lying in bed, however was able to sleep, and bleeding resumed this morning when she removed the gauze from her nose.  At the time of my initial evaluation patient denies sensation of blood going down her throat.  She states that does not feel that her nose is bleeding.  She has not blood around leaking around the packing that was placed at urgent care.  She denies any headache, lightheadedness, dizziness, chest pain, shortness of breath, palpitations, nausea, vomiting since that time.  She endorses remote history of nosebleeds in the past, however has not any problems recently.  She states that she normally takes her blood pressure medication daily as prescribed, however did not take it today because she was distracted by her nosebleed.  I have personally reviewed patient medical records patient history of hypothyroidism, hypertension.  She is not on any anticoagulation but is on multiple antihypertensive medications.   HPI     Past Medical History:  Diagnosis Date  . Anxiety   . Atherosclerosis of aorta ([place]) 08/01/2017  . Cervical spine degeneration    Severe by  CT April 2012  . GERD   . Hyperlipidemia   . Hypertension   . HYPOTHYROIDISM   . INSOMNIA   . 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 OSTEOPENIA   . Polycystic kidney disease   . RENAL FAILURE   . VITAMIN D DEFICIENCY     Patient Active Problem List   Diagnosis Date Noted  . Penetrating forearm wound, left, sequela 05/26/2020  . Right wrist pain 11/09/2019  . Wheezing 05/08/2019  . Osteoporosis 01/30/2018  . Atherosclerosis of aorta ([place]) 08/01/2017  . CKD (chronic kidney disease) stage 3, GFR 30-59 ml/min (HCC) 08/01/2017  . Acute pyelonephritis 04/13/2017  . Chronic low back pain 05/04/2016  . Easy bruising 12/02/2015  . Memory loss 09/17/2015  . Encounter for immunization 06/03/2015  . Fatigue 11/25/2014  . Degenerative joint disease 05/26/2014  . Hives 04/08/2014  . Dyspnea 02/03/2014  . Peripheral neuropathy ([place]) 02/05/2013  . Bilateral foot pain 11/20/2012  . Vaginal itching 06/26/2012  . Low blood pressure 06/19/2012  . Liver abscess 06/19/2012  . Normocytic anemia 05/07/2012  . Hypokalemia 05/06/2012  . Fever 05/06/2012  . Dehydration 05/06/2012  . Hepatic abscess 05/03/2012  . Rash 11/20/2011  . Preventative health care 11/16/2011  . Anxiety 11/16/2011  . 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 Essential hypertension 02/24/2010  . DERMATITIS, ATOPIC 01/20/2010  . Edema 12/28/2009  . Vitamin D deficiency 11/10/2009  . UNS ADVRS EFF OTH RX MEDICINAL&BIOLOGICAL SBSTNC 11/10/2009  . TACHYCARDIA 10/05/2009  . OTHER  POSTSURGICAL STATUS OTHER 07/21/2009  . POSTMENOPAUSAL SYNDROME 03/09/2009  . 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 INSOMNIA 06/17/2008  . Hypothyroidism 02/12/2008  . Renal failure 02/12/2008  . Polycystic kidney 02/12/2008  . GERD 06/12/2007  . Disorder of bone and cartilage 06/12/2007  . Hyperlipidemia 03/25/2007    Past Surgical History:  Procedure Laterality Date  . ABDOMINAL HYSTERECTOMY    .  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 bladder operation, but per sling procedure    . BUNIONECTOMY    . CERVICAL DISC SURGERY    . fx rt ankle    . left temporal bx artery-negative 2     . PARTIAL NEPHRECTOMY    . percutaneous drainage of liver abscess  05/06/2012     OB History   No obstetric history on file.     Family History  Problem Relation Age of Onset  . Stroke Mother     Social History   Tobacco Use  . Smoking status: Never Smoker  . Smokeless tobacco: Never Used  Vaping Use  . Vaping Use: Never used  Substance Use Topics  . Alcohol use: No  . Drug use: No    Home Medications Prior to Admission medications   Medication Sig Start Date End Date Taking? Authorizing Provider  ALPRAZolam Duanne Moron) 0.5 MG tablet TAKE ONE TABLET TWICE DAILY AS NEEDED 08/09/20   Biagio Borg, MD  amLODipine (NORVASC) 5 MG tablet Take 1 tablet (5 mg total) by mouth daily. 12/17/19 12/16/20  Biagio Borg, MD  amphetamine-dextroamphetamine (ADDERALL) 10 MG tablet TAKE ONE TABLET TWICE DAILY 08/23/20   Biagio Borg, MD  ARIPiprazole (ABILIFY) 5 MG tablet TAKE ONE TABLET DAILY 08/09/20   Biagio Borg, MD  doxycycline (VIBRA-TABS) 100 MG tablet Take 1 tablet (100 mg total) by mouth 2 (two) times daily. 05/26/20   Plotnikov, Evie Lacks, MD  hydrochlorothiazide (HYDRODIURIL) 25 MG tablet Take 1 tablet (25 mg total) by mouth daily. 12/17/19   Biagio Borg, MD  HYDROcodone-acetaminophen (NORCO/VICODIN) 5-325 MG tablet Take 1 tablet by mouth every 6 (six) hours as needed for moderate pain. 04/07/19   Biagio Borg, MD  Hypromellose (ARTIFICIAL TEARS OP) Place 1 drop into both eyes daily as needed (dry eyes).    [provider]  labetalol (NORMODYNE) 300 MG tablet TAKE ONE TABLET TWICE DAILY 03/23/20   Biagio Borg, MD  mupirocin ointment [facility]) 2 % On leg wound w/dressing change qd or bid 05/26/20   Plotnikov, Evie Lacks, MD  potassium chloride SA (K-DUR,KLOR-CON) 20 MEQ tablet TAKE ONE TABLET TWICE  DAILY 05/22/17   Biagio Borg, MD  PREMARIN 0.625 MG tablet TAKE ONE TABLET DAILY FOR 21 DAYS THEN DO NOT TAKE FOR 7 DAYS 03/23/20   Biagio Borg, MD  rosuvastatin (CRESTOR) 20 MG tablet TAKE ONE TABLET DAILY 05/11/20   Biagio Borg, MD  sertraline (ZOLOFT) 100 MG tablet TAKE ONE TABLET TWICE DAILY 11/26/19   Biagio Borg, MD  telmisartan (MICARDIS) 80 MG tablet TAKE ONE TABLET DAILY 05/11/20   Biagio Borg, MD    Allergies    Codeine, Sulfonamide derivatives, and Zocor [simvastatin]  Review of Systems   Review of Systems  Constitutional: Negative.   HENT: Positive for nosebleeds. Negative for sore throat and trouble swallowing.   Respiratory: Negative.   Gastrointestinal: Negative.   Genitourinary: Negative.   Musculoskeletal: Negative.   Skin: Negative.   Neurological: Negative.  Hematological: Negative.     Physical Exam Updated Vital Signs BP (!) 177/89 (BP Location: Right Arm)   Pulse 77   Temp 98 F (36.7 C) (Oral)   Resp 16   SpO2 99%   Physical Exam Vitals and nursing note reviewed.  HENT:     Head: Normocephalic and atraumatic.     Nose: No nasal deformity, septal deviation or laceration.     Right Nostril: Epistaxis present. No septal hematoma.     Left Nostril: Epistaxis present. No septal hematoma.     Comments: Dried crusted blood in the right nare, bloody packing present in the left nare. Right nare is hemostatic at this time.  At time of removal of packing from left nare, there is brisk blood flow.  After administration of Afrin, there is mild oozing, obviously coming from the anterior septum.  There is not appear to be any blood coming from more posteriorly in the nare.  Pledgets soaked with lidocaine with epinephrine was placed in the nare patient was instructed to hold pressure for the next 15 minutes at which time I will reevaluate patient.    Mouth/Throat:     Mouth: Mucous membranes are moist.     Pharynx: Oropharynx is clear. Uvula midline. No  oropharyngeal exudate, posterior oropharyngeal erythema or uvula swelling.     Tonsils: No tonsillar exudate.     Comments: No blood is seen posterior oropharynx. Eyes:     General: Lids are normal. Vision grossly intact.        Right eye: No discharge.        Left eye: No discharge.     Extraocular Movements: Extraocular movements intact.     Conjunctiva/sclera: Conjunctivae normal.     Pupils: Pupils are equal, round, and reactive to light.  Neck:     Trachea: Trachea normal.  Cardiovascular:     Rate and Rhythm: Normal rate and regular rhythm.     Pulses: Normal pulses.          Radial pulses are 2+ on the right side and 2+ on the left side.       Dorsalis pedis pulses are 2+ on the right side and 2+ on the left side.     Heart sounds: Normal heart sounds. No murmur heard.   Pulmonary:     Effort: Pulmonary effort is normal. No respiratory distress.     Breath sounds: Normal breath sounds. No wheezing or rales.  Chest:     Chest wall: No deformity, swelling, tenderness, crepitus or edema.  Abdominal:     General: Bowel sounds are normal. There is no distension.     Palpations: Abdomen is soft.     Tenderness: There is no abdominal tenderness. There is no guarding or rebound.  Musculoskeletal:        General: No deformity.     Cervical back: Neck supple. No crepitus. No pain with movement, spinous process tenderness or muscular tenderness.     Right lower leg: No edema.     Left lower leg: No edema.  Lymphadenopathy:     Cervical: No cervical adenopathy.  Skin:    General: Skin is warm and dry.     Capillary Refill: Capillary refill takes less than 2 seconds.  Neurological:     General: No focal deficit present.     Mental Status: She is alert and oriented to person, place, and time. Mental status is at baseline.     Cranial Nerves: Cranial nerves  are intact.     Sensory: Sensation is intact.     Motor: Motor function is intact.     Coordination: Coordination is intact.      Gait: Gait is intact.  Psychiatric:        Mood and Affect: Mood normal.     ED Results / Procedures / Treatments   Labs (all labs ordered are listed, but only abnormal results are displayed) Labs Reviewed  COMPREHENSIVE METABOLIC PANEL - Abnormal; Notable for the following components:      Result Value   GFR, Estimated 58 (*)    All other components within normal limits  CBC WITH DIFFERENTIAL/PLATELET    EKG None  Radiology No results found.  Procedures Procedures  Medications Ordered in ED Medications  amLODipine (NORVASC) tablet 5 mg (5 mg Oral Given 09/23/20 1431)  labetalol (NORMODYNE) tablet 100 mg (100 mg Oral Given 09/23/20 1432)  oxymetazoline (AFRIN) 0.05 % nasal spray 1 spray (1 spray Each Nare Given 09/23/20 1537)  lidocaine-EPINEPHrine (XYLOCAINE W/EPI) 1 %-1:100000 (with pres) injection 10 mL (10 mLs Other Given 09/23/20 1537)    ED Course  I have reviewed the triage vital signs and the nursing notes.  Pertinent labs & imaging results that were available during my care of the patient were reviewed by me and considered in my medical decision making (see chart for details).  Clinical Course as of 09/23/20 2218  Thu Sep 23, 2020  1431 Packing removed and oozing started from left nare. Afrin instilled with improvement in oozing. Bleeding appears to be coming from anterior septum. Pledget soaked in lido with epi and placed in left nare. Right nare is hemostatic. Will have patient apply pressure for 15 minutes and reassess.  [RS]  1450 Patient reevaluated and pledget removed.  Hemostasis obtained.  There are areas of friability and irritation on the anterior septum of the left nare, however no areas requiring cauterization at this time. [RS]    Clinical Course User Index [RS] Bhargav Barbaro, Sharlene Dory   MDM Rules/Calculators/A&P                         80 y/o female who presents with concern for intermittent epistaxis from the left nare since last night.   Patient is not anticoagulated.  Patient hypertensive on intake, vital signs otherwise normal.  Cardiopulmonary exam unremarkable, abdominal exam is benign.  Dried crusted blood in the right nare, persistent bleeding from the left nare after removal of packing, Afrin and pledget soaked with lidocaine with epi were placed in the left nare, patient was left to hold pressure x15 minutes.  Basic laboratory studies obtained in triage.  CBC without leukocytosis or anemia, CMP unremarkable.  Patient administered home antihypertensive medications, she had not taken this morning.  ED course for epistaxis as above.  Hemostasis obtained.  Recommend humidifier and application of Vaseline at home at bedtime.  Given reassuring physical exam, laboratory studies, and vital signs, no further work-up is warranted in the ED at this time.  Lyda voiced understanding of her medical evaluation and treatment plan.  Each of her questions was answered to her expressed satisfaction. Return precautions given.  Patient is stable and appropriate for discharge at this time.  This chart was dictated using voice recognition software, Dragon. Despite the best efforts of this provider to proofread and correct errors, errors may still occur which can change documentation meaning.  Final Clinical Impression(s) / ED Diagnoses Final diagnoses:  Epistaxis    Rx / DC Orders ED Discharge Orders    None       Aura Dials 09/23/20 2219    Deno Etienne, DO 09/24/20 [phone]

## 2020-09-23 NOTE — ED Provider Notes (Addendum)
[facility]    CSN: [id number] Arrival date & time: 09/23/20  1014      History   Chief Complaint Chief Complaint  Patient presents with  . Cough  . Epistaxis    HPI Alyssa Fernandez is a 80 y.o. female presenting for multiple complaints. History aortic atherosclerosis GERD hyperlipidemia hypertension hypothyroid insomnia narcolepsy osteopenia polycystic kidney disease renal failure osteoporosis CKD fatigue.  -Presenting with nosebleed for 24 hours.  States she has been leaning back and putting pressure on her nares and pinching them together without improvement.  Has been packing this with gauze at home but states that every time she removes the gauze the nosebleed returns.  States significant bleeding from left nostril only. -Blood pressure is significantly elevated today.  Patient states that she has not been taking her blood pressure medications as prescribed and has last taken them over 24 hours ago.  She denies chest pain headaches weakness generalized or focal.  Denies vision changes -States that she got her Covid vaccine 2 days ago.  Since then she has developed a cough and this nosebleed.  Denies other URI symptoms denies fevers cough chills nausea vomiting diarrhea. -Patient also states that her husband died 2 weeks ago and since then she has been undergoing the grieving process   HPI  Past Medical History:  Diagnosis Date  . Anxiety   . Atherosclerosis of aorta ([place]) 08/01/2017  . 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 GERD   . Hyperlipidemia   . Hypertension   . HYPOTHYROIDISM   . INSOMNIA   . 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 OSTEOPENIA   . Polycystic kidney disease   . RENAL FAILURE   . VITAMIN D DEFICIENCY     Patient Active Problem List   Diagnosis Date Noted  . Penetrating forearm wound, left, sequela 05/26/2020  . Right wrist pain 11/09/2019  . Wheezing 05/08/2019  . Osteoporosis 01/30/2018  .  Atherosclerosis of aorta ([place]) 08/01/2017  . CKD (chronic kidney disease) stage 3, GFR 30-59 ml/min ([facility]) 08/01/2017  . Acute pyelonephritis 04/13/2017  . Chronic low back pain 05/04/2016  . Easy bruising 12/02/2015  . Memory loss 09/17/2015  . Encounter for immunization 06/03/2015  . Fatigue 11/25/2014  . Degenerative joint disease 05/26/2014  . Hives 04/08/2014  . Dyspnea 02/03/2014  . Peripheral neuropathy ([place]) 02/05/2013  . Bilateral foot pain 11/20/2012  . Vaginal itching 06/26/2012  . Low blood pressure 06/19/2012  . Liver abscess 06/19/2012  . Normocytic anemia 05/07/2012  . Hypokalemia 05/06/2012  . Fever 05/06/2012  . Dehydration 05/06/2012  . Hepatic abscess 05/03/2012  . Rash 11/20/2011  . Preventative health care 11/16/2011  . Anxiety 11/16/2011  . 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 Essential hypertension 02/24/2010  . DERMATITIS, ATOPIC 01/20/2010  . Edema 12/28/2009  . Vitamin D deficiency 11/10/2009  . UNS ADVRS EFF OTH RX MEDICINAL&BIOLOGICAL SBSTNC 11/10/2009  . TACHYCARDIA 10/05/2009  . OTHER POSTSURGICAL STATUS OTHER 07/21/2009  . POSTMENOPAUSAL SYNDROME 03/09/2009  . 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 INSOMNIA 06/17/2008  . Hypothyroidism 02/12/2008  . Renal failure 02/12/2008  . Polycystic kidney 02/12/2008  . GERD 06/12/2007  . Disorder of bone and cartilage 06/12/2007  . Hyperlipidemia 03/25/2007    Past Surgical History:  Procedure Laterality Date  . ABDOMINAL HYSTERECTOMY    . 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 bladder operation, but per sling procedure    .  BUNIONECTOMY    . CERVICAL DISC SURGERY    . fx rt ankle    . left temporal bx artery-negative 2     . PARTIAL NEPHRECTOMY    . percutaneous drainage of liver abscess  05/06/2012    OB History   No obstetric history on file.      Home Medications    Prior to Admission medications   Medication Sig Start Date End Date Taking? Authorizing Provider   ALPRAZolam Duanne Moron) 0.5 MG tablet TAKE ONE TABLET TWICE DAILY AS NEEDED 08/09/20   Biagio Borg, MD  amLODipine (NORVASC) 5 MG tablet Take 1 tablet (5 mg total) by mouth daily. 12/17/19 12/16/20  Biagio Borg, MD  amphetamine-dextroamphetamine (ADDERALL) 10 MG tablet TAKE ONE TABLET TWICE DAILY 08/23/20   Biagio Borg, MD  ARIPiprazole (ABILIFY) 5 MG tablet TAKE ONE TABLET DAILY 08/09/20   Biagio Borg, MD  doxycycline (VIBRA-TABS) 100 MG tablet Take 1 tablet (100 mg total) by mouth 2 (two) times daily. 05/26/20   Plotnikov, Evie Lacks, MD  hydrochlorothiazide (HYDRODIURIL) 25 MG tablet Take 1 tablet (25 mg total) by mouth daily. 12/17/19   Biagio Borg, MD  HYDROcodone-acetaminophen (NORCO/VICODIN) 5-325 MG tablet Take 1 tablet by mouth every 6 (six) hours as needed for moderate pain. 04/07/19   Biagio Borg, MD  Hypromellose (ARTIFICIAL TEARS OP) Place 1 drop into both eyes daily as needed (dry eyes).    [provider]  labetalol (NORMODYNE) 300 MG tablet TAKE ONE TABLET TWICE DAILY 03/23/20   Biagio Borg, MD  mupirocin ointment Drue Stager) 2 % On leg wound w/dressing change qd or bid 05/26/20   Plotnikov, Evie Lacks, MD  potassium chloride SA (K-DUR,KLOR-CON) 20 MEQ tablet TAKE ONE TABLET TWICE DAILY 05/22/17   Biagio Borg, MD  PREMARIN 0.625 MG tablet TAKE ONE TABLET DAILY FOR 21 DAYS THEN DO NOT TAKE FOR 7 DAYS 03/23/20   Biagio Borg, MD  rosuvastatin (CRESTOR) 20 MG tablet TAKE ONE TABLET DAILY 05/11/20   Biagio Borg, MD  sertraline (ZOLOFT) 100 MG tablet TAKE ONE TABLET TWICE DAILY 11/26/19   Biagio Borg, MD  telmisartan (MICARDIS) 80 MG tablet TAKE ONE TABLET DAILY 05/11/20   Biagio Borg, MD    Family History Family History  Problem Relation Age of Onset  . Stroke Mother     Social History Social History   Tobacco Use  . Smoking status: Never Smoker  . Smokeless tobacco: Never Used  Vaping Use  . Vaping Use: Never used  Substance Use Topics  . Alcohol use: No  .  Drug use: No     Allergies   Codeine, Sulfonamide derivatives, and Zocor [simvastatin]   Review of Systems Review of Systems  Constitutional: Negative for appetite change, chills and fever.  HENT: Positive for nosebleeds. Negative for congestion, ear pain, postnasal drip, rhinorrhea, sinus pressure, sinus pain and sore throat.   Eyes: Negative for redness and visual disturbance.  Respiratory: Positive for cough. Negative for chest tightness, shortness of breath and wheezing.   Cardiovascular: Negative for chest pain and palpitations.  Gastrointestinal: Negative for abdominal pain, constipation, diarrhea, nausea and vomiting.  Genitourinary: Negative for dysuria, frequency and urgency.  Musculoskeletal: Negative for myalgias.  Neurological: Negative for dizziness, tremors, seizures, syncope, speech difficulty, weakness, light-headedness, numbness and headaches.  Psychiatric/Behavioral: Negative for confusion.  All other systems reviewed and are negative.    Physical Exam Triage Vital Signs ED Triage Vitals  Enc Vitals Group     BP 09/23/20 1150 (!) 208/11     Pulse Rate 09/23/20 1150 71     Resp 09/23/20 1150 15     Temp 09/23/20 1150 98.2 F (36.8 C)     Temp Source 09/23/20 1150 Oral     SpO2 09/23/20 1150 96 %     Weight --      Height --      Head Circumference --      Peak Flow --      Pain Score 09/23/20 1153 0     Pain Loc --      Pain Edu? --      Excl. in [place]? --    No data found.  Updated Vital Signs BP (!) 222/114   Pulse 71   Temp 98.2 F (36.8 C) (Oral)   Resp 15   SpO2 96%   Visual Acuity Right Eye Distance:   Left Eye Distance:   Bilateral Distance:    Right Eye Near:   Left Eye Near:    Bilateral Near:     Physical Exam Vitals reviewed.  Constitutional:      General: She is not in acute distress.    Appearance: Normal appearance. She is well-developed. She is not ill-appearing.  HENT:     Head: Normocephalic and atraumatic.     Right  Ear: Hearing, tympanic membrane, ear canal and external ear normal. No drainage, swelling or tenderness. No middle ear effusion. There is no impacted cerumen. No mastoid tenderness. Tympanic membrane is not perforated, erythematous, retracted or bulging.     Left Ear: Hearing, tympanic membrane, ear canal and external ear normal. No drainage, swelling or tenderness.  No middle ear effusion. There is no impacted cerumen. No mastoid tenderness. Tympanic membrane is not perforated, erythematous, retracted or bulging.     Nose:     Left Nostril: Epistaxis present.     Right Sinus: No maxillary sinus tenderness or frontal sinus tenderness.     Left Sinus: No maxillary sinus tenderness or frontal sinus tenderness.     Comments: Significant epistaxis L nare    Mouth/Throat:     Mouth: Mucous membranes are moist.     Pharynx: Uvula midline. No oropharyngeal exudate, posterior oropharyngeal erythema or uvula swelling.     Tonsils: No tonsillar exudate.  Eyes:     Pupils: Pupils are equal, round, and reactive to light.  Cardiovascular:     Rate and Rhythm: Normal rate and regular rhythm.     Pulses:          Radial pulses are 2+ on the right side and 2+ on the left side.     Heart sounds: Normal heart sounds.  Pulmonary:     Effort: Pulmonary effort is normal.     Breath sounds: Normal breath sounds and air entry. No wheezing, rhonchi or rales.  Chest:     Chest wall: No tenderness.  Abdominal:     General: Abdomen is flat. Bowel sounds are normal.     Palpations: Abdomen is soft.     Tenderness: There is no abdominal tenderness. There is no guarding or rebound. Negative signs include Murphy's sign and McBurney's sign.  Lymphadenopathy:     Cervical: No cervical adenopathy.  Neurological:     General: No focal deficit present.     Mental Status: She is alert and oriented to person, place, and time.     Motor: Motor function is intact.  Coordination: Coordination is intact.     Gait: Gait  is intact.     Comments: CN 2-12 grossly intact. No weakness or sensation changes in UEs or LEs. PERRLA.  Psychiatric:        Attention and Perception: Attention and perception normal.        Mood and Affect: Mood and affect normal.        Behavior: Behavior normal. Behavior is cooperative.        Thought Content: Thought content normal.        Judgment: Judgment normal.      UC Treatments / Results  Labs (all labs ordered are listed, but only abnormal results are displayed) Labs Reviewed - No data to display  EKG   Radiology No results found.  Procedures Procedures (including critical care time)  Medications Ordered in UC Medications - No data to display  Initial Impression / Assessment and Plan / UC Course  I have reviewed the triage vital signs and the nursing notes.  Pertinent labs & imaging results that were available during my care of the patient were reviewed by me and considered in my medical decision making (see chart for details).     Patient is presenting today for multiple complaints including uncontrolled epistaxis and significantly elevated BP. On exam, significant epistaxis L nare. Neuro exam wnl, she is hemodynamically stable for self transport to ED at this time. It is my medical opinion that this patient requires a higher level of care given the above symptoms. She will head to [facility] ED for further evaluation at this time. She does not have a ride and so transport was provided by EMS today.   Of note this patient did have her covid booster 2 days ago. We discussed that it is unlikely that this has contributed to nosebleed.  This patient does have a diagnosis of essential hypertension, and has not taken her BP medication this morning.  Spent over 40 minutes obtaining H&P, performing physical, discussing results, treatment plan and plan for follow-up with patient. Patient agrees with plan.    Final Clinical Impressions(s) / UC Diagnoses   Final  diagnoses:  Essential hypertension  Epistaxis  Viral URI with cough  Grief reaction     Discharge Instructions     -Head to [facility] ED. EMS to provide transport.    ED Prescriptions    None     PDMP not reviewed this encounter.   Hazel Sams, PA-C 09/23/20 1238    Hazel Sams, PA-C 09/23/20 [phone]

## 2020-09-23 NOTE — Discharge Instructions (Signed)
You were evaluated in the emergency department today for your nosebleed.  Your blood work and vital signs are very reassuring.  We were able to stop your nosebleed in the emergency department.   Nose bleeds are more common in the winter because the air is more dry. You should utilize a humidifier in your room at night and apply a small amount of vaseline around the inside of each nostril in the evenings nightly before bed.   If your nose bleeds again, apply CONSTANT firm pressure for 15 minutes before removing the gauze to reassess the bleeding. If the bleeding does not stop, apply 15 more minutes of pressure and reassess. If it still has not stopped, or if it is running down your throat in large amounts despite the pressure and leaning forward, you should present to the emergency department or urgent care for reevaluation.   You should continue your blood pressure medications as prescribed.

## 2020-09-23 NOTE — ED Triage Notes (Signed)
Pt c/o nose bleed that started last night. She states she has covered her left nostril and states if she removed the tissue from her left nostril she will start bleeding. Pt states she has developed a cough. She states she received her COVID booster shot 2 days ago. She states the nose bleeds and cough are unusual for her. She states she decided to lay down to see if the nose bleed would stop, she states she felt blood going down her throat. Pt states her husband passed away 2 weeks ago and believes she might have HBP due to grief.

## 2020-09-23 NOTE — ED Notes (Signed)
EMS called for pt transport 

## 2020-09-23 NOTE — Discharge Instructions (Addendum)
-  Head to [facility] ED. EMS to provide transport.

## 2020-09-24 ENCOUNTER — Other Ambulatory Visit: Payer: Self-pay

## 2020-09-27 ENCOUNTER — Other Ambulatory Visit: Payer: Self-pay

## 2020-09-27 ENCOUNTER — Ambulatory Visit (INDEPENDENT_AMBULATORY_CARE_PROVIDER_SITE_OTHER): Payer: PPO | Admitting: Internal Medicine

## 2020-09-27 ENCOUNTER — Encounter: Payer: Self-pay | Admitting: Internal Medicine

## 2020-09-27 VITALS — BP 140/80 | HR 62 | Temp 97.9°F | Ht 63.0 in | Wt 142.0 lb

## 2020-09-27 DIAGNOSIS — I10 Essential (primary) hypertension: Secondary | ICD-10-CM | POA: Diagnosis not present

## 2020-09-27 DIAGNOSIS — Z0001 Encounter for general adult medical examination with abnormal findings: Secondary | ICD-10-CM | POA: Diagnosis not present

## 2020-09-27 DIAGNOSIS — F4321 Adjustment disorder with depressed mood: Secondary | ICD-10-CM | POA: Insufficient documentation

## 2020-09-27 DIAGNOSIS — R413 Other amnesia: Secondary | ICD-10-CM | POA: Diagnosis not present

## 2020-09-27 DIAGNOSIS — Z23 Encounter for immunization: Secondary | ICD-10-CM

## 2020-09-27 DIAGNOSIS — E559 Vitamin D deficiency, unspecified: Secondary | ICD-10-CM

## 2020-09-27 DIAGNOSIS — E7849 Other hyperlipidemia: Secondary | ICD-10-CM

## 2020-09-27 DIAGNOSIS — G3184 Mild cognitive impairment, so stated: Secondary | ICD-10-CM | POA: Diagnosis not present

## 2020-09-27 DIAGNOSIS — E039 Hypothyroidism, unspecified: Secondary | ICD-10-CM

## 2020-09-27 DIAGNOSIS — I700 Atherosclerosis of aorta: Secondary | ICD-10-CM | POA: Diagnosis not present

## 2020-09-27 DIAGNOSIS — N183 Chronic kidney disease, stage 3 unspecified: Secondary | ICD-10-CM

## 2020-09-27 MED ORDER — DONEPEZIL HCL 5 MG PO TABS: 5.0000 mg | ORAL_TABLET | Freq: Every day | ORAL | 3 refills | Status: DC

## 2020-09-27 MED ORDER — THERA-D 2000 50 MCG (2000 UT) PO TABS: ORAL_TABLET | ORAL | 99 refills | Status: DC

## 2020-09-27 NOTE — Assessment & Plan Note (Signed)
Lab Results  Component Value Date   TSH 4.01 05/05/2019   Stable, pt to continue levothyroxine

## 2020-09-27 NOTE — Assessment & Plan Note (Signed)
Last vitamin D Lab Results  Component Value Date   VD25OH 36.51 05/05/2019   Stable, cont oral replacement

## 2020-09-27 NOTE — Assessment & Plan Note (Signed)
Age and sex appropriate education and counseling updated with regular exercise and diet Referrals for preventative services - none needed Immunizations addressed - for flu shot Smoking counseling  - none needed Evidence for depression or other mood disorder - none significant Most recent labs reviewed. I have personally reviewed and have noted: 1) the patient's medical and social history 2) The patient's current medications and supplements 3) The patient's height, weight, and BMI have been recorded in the chart

## 2020-09-27 NOTE — Assessment & Plan Note (Signed)
Pt agreeable for tx with aricept 5 qd

## 2020-09-27 NOTE — Patient Instructions (Addendum)
You had the flu shot today  Please take all new medication as prescribed - the aricept at 5 mg per day  Please take OTC Vitamin D3 at 2000 units per day, indefinitely.  Please continue all other medications as before, and refills have been done if requested.  Please have the pharmacy call with any other refills you may need.  Please continue your efforts at being more active, low cholesterol diet, and weight control.  You are otherwise up to date with prevention measures today.  Please keep your appointments with your specialists as you may have planned  Please go to the LAB at the blood drawing area for the tests to be done  You will be contacted by phone if any changes need to be made immediately.  Otherwise, you will receive a letter about your results with an explanation, but please check with MyChart first.  Please remember to sign up for MyChart if you have not done so, as this will be important to you in the future with finding out test results, communicating by private email, and scheduling acute appointments online when needed.  Please make an Appointment to return in 6 months, or sooner if needed

## 2020-09-27 NOTE — Assessment & Plan Note (Signed)
To continue statin crestor and diet

## 2020-09-27 NOTE — Progress Notes (Signed)
Patient ID: Alyssa Fernandez, female   DOB: [birth year], 80 y.o.   MRN: [id number]       Chief Complaint:: wellness exam and memory chagnes, low vit d, ckd,  and grief       HPI:  Alyssa Fernandez is a 80 y.o. female here for wellness exam; due for flu shot, o/w up to date, non smoker.   Wt Readings from Last 3 Encounters:  09/27/20 142 lb (64.4 kg)  06/04/20 139 lb 12.8 oz (63.4 kg)  05/31/20 137 lb (62.1 kg)   BP Readings from Last 3 Encounters:  09/27/20 140/80  09/23/20 (!) 177/89  09/23/20 (!) 222/114   Immunization History  Administered Date(s) Administered  . Fluad Quad(high Dose 65+) 04/19/2019, 09/27/2020  . Influenza Split 06/02/2011, 05/21/2012  . Influenza Whole 06/12/2007  . Influenza, High Dose Seasonal PF 04/20/2017  . Influenza,inj,Quad PF,6+ Mos 05/22/2013, 04/08/2014, 06/03/2015, 05/04/2016  . Influenza,inj,quad, With Preservative 05/21/2017  . PFIZER Comirnaty(Gray Top)Covid-19 Tri-Sucrose Vaccine 01/03/2020, 01/24/2020, 09/16/2020  . Pneumococcal Conjugate-13 05/22/2013  . Pneumococcal Polysaccharide-23 06/12/2007  . Td 10/16/2006  . Tdap 04/20/2017  . Zoster 10/16/2006  . Zoster Recombinat (Shingrix) 02/15/2018, 04/10/2018   There are no preventive care reminders to display for this patient.       Also husband died 3 mo ago after fall to head and copd, still markedly grieving but declines tx or counseling at this time. . Pt recently had UC visit with sbp> 200 associated with epistaxis bilateral, tx with what sounds like rhino rockets and aftrin, removed later herself, CBC normal, and not taking blood thinners,  First episode ever for her.  Declines referral to ENT.  BP at home since then has been 130-140.  No ent pain or symtoms like congestion, no fever or recurrent bleeding. Also c/o persistent worsening mild memory problems with recall, now cannot remember her last 4 of her SSN for example, and has to make notes to herselft often.  Pt denies new neurological  symptoms such as new headache, or facial or extremity weakness or numbness  Pt denies chest pain, increased sob or doe, wheezing, orthopnea, PND, increased LE swelling, palpitations, dizziness or syncope.  Pt denies polydipsia, polyuria,  Past Medical History:  Diagnosis Date  . Anxiety   . Atherosclerosis of aorta ([place]) 08/01/2017  . 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 GERD   . Hyperlipidemia   . Hypertension   . HYPOTHYROIDISM   . INSOMNIA   . 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 OSTEOPENIA   . Polycystic kidney disease   . RENAL FAILURE   . VITAMIN D DEFICIENCY    Past Surgical History:  Procedure Laterality Date  . ABDOMINAL HYSTERECTOMY    . 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 bladder operation, but per sling procedure    . BUNIONECTOMY    . CERVICAL DISC SURGERY    . fx rt ankle    . left temporal bx artery-negative 2     . PARTIAL NEPHRECTOMY    . percutaneous drainage of liver abscess  05/06/2012    reports that she has never smoked. She has never used smokeless tobacco. She reports that she does not drink alcohol and does not use drugs. family history includes Stroke in her mother. Allergies  Allergen Reactions  . Codeine Swelling  . Sulfonamide Derivatives Swelling  . Zocor [Simvastatin] Other (See Comments)    Cognitive decrease  Current Outpatient Medications on File Prior to Visit  Medication Sig Dispense Refill  . ALPRAZolam (XANAX) 0.5 MG tablet TAKE ONE TABLET TWICE DAILY AS NEEDED 60 tablet 5  . amLODipine (NORVASC) 5 MG tablet Take 1 tablet (5 mg total) by mouth daily. 90 tablet 3  . amphetamine-dextroamphetamine (ADDERALL) 10 MG tablet TAKE ONE TABLET TWICE DAILY 60 tablet 0  . ARIPiprazole (ABILIFY) 5 MG tablet TAKE ONE TABLET DAILY 90 tablet 3  . Hypromellose (ARTIFICIAL TEARS OP) Place 1 drop into both eyes daily as needed (dry eyes).    . labetalol (NORMODYNE) 300 MG tablet TAKE ONE TABLET TWICE DAILY 180 tablet 3   . PREMARIN 0.625 MG tablet TAKE ONE TABLET DAILY FOR 21 DAYS THEN DO NOT TAKE FOR 7 DAYS 63 tablet 1  . rosuvastatin (CRESTOR) 20 MG tablet TAKE ONE TABLET DAILY 90 tablet 3  . sertraline (ZOLOFT) 100 MG tablet TAKE ONE TABLET TWICE DAILY 180 tablet 3  . telmisartan (MICARDIS) 80 MG tablet TAKE ONE TABLET DAILY 90 tablet 3  . doxycycline (VIBRA-TABS) 100 MG tablet Take 1 tablet (100 mg total) by mouth 2 (two) times daily. (Patient not taking: Reported on 09/27/2020) 20 tablet 0  . hydrochlorothiazide (HYDRODIURIL) 25 MG tablet Take 1 tablet (25 mg total) by mouth daily. (Patient not taking: Reported on 09/27/2020) 90 tablet 3  . HYDROcodone-acetaminophen (NORCO/VICODIN) 5-325 MG tablet Take 1 tablet by mouth every 6 (six) hours as needed for moderate pain. (Patient not taking: Reported on 09/27/2020) 30 tablet 0  . mupirocin ointment (BACTROBAN) 2 % On leg wound w/dressing change qd or bid (Patient not taking: Reported on 09/27/2020) 30 g 0  . potassium chloride SA (K-DUR,KLOR-CON) 20 MEQ tablet TAKE ONE TABLET TWICE DAILY (Patient not taking: Reported on 09/27/2020) 60 tablet 11   No current facility-administered medications on file prior to visit.        ROS:  All others reviewed and negative.  Objective        PE:  BP 140/80   Pulse 62   Temp 97.9 F (36.6 C) (Oral)   Ht 5\' 3"  (1.6 m)   Wt 142 lb (64.4 kg)   SpO2 95%   BMI 25.15 kg/m                 Constitutional: Pt appears in NAD               HENT: Head: NCAT.                Right Ear: External ear normal.                 Left Ear: External ear normal.                Eyes: . Pupils are equal, round, and reactive to light. Conjunctivae and EOM are normal               Nose: without d/c or deformity               Neck: Neck supple. Gross normal ROM               Cardiovascular: Normal rate and regular rhythm.                 Pulmonary/Chest: Effort normal and breath sounds without rales or wheezing.                Abd:  Soft, NT, ND,  + BS, no  organomegaly               Neurological: Pt is alert. At baseline orientation, motor grossly intact               Skin: Skin is warm. No rashes, no other new lesions, LE edema - none               Psychiatric: Pt behavior is normal without agitation   Micro: none  Cardiac tracings I have personally interpreted today:  none  Pertinent Radiological findings (summarize): none   Lab Results  Component Value Date   WBC 5.6 09/23/2020   HGB 13.7 09/23/2020   HCT 41.4 09/23/2020   PLT 237 09/23/2020   GLUCOSE 97 09/23/2020   CHOL 153 05/05/2019   TRIG 152.0 (H) 05/05/2019   HDL 51.30 05/05/2019   LDLCALC 71 05/05/2019   ALT 11 09/23/2020   AST 16 09/23/2020   NA 139 09/23/2020   K 3.5 09/23/2020   CL 103 09/23/2020   CREATININE 0.98 09/23/2020   BUN 16 09/23/2020   CO2 26 09/23/2020   TSH 4.01 05/05/2019   INR 1.14 04/14/2017   HGBA1C 5.9 05/19/2008   Assessment/Plan:  Alyssa Fernandez is a 80 y.o. 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 Encounter for well adult exam with abnormal findings Age and sex appropriate education and counseling updated with regular exercise and diet Referrals for preventative services - none needed Immunizations addressed - for flu shot Smoking counseling  - none needed Evidence for depression or other mood disorder - none significant Most recent labs reviewed. I have personally reviewed and have noted: 1) the patient's medical and social history 2) The patient's current medications and supplements 3) The patient's height, weight, and BMI have been recorded in the chart   Atherosclerosis of aorta ([place]) To continue statin crestor and diet  CKD  (chronic kidney disease) stage 3, GFR 30-59 ml/min (HCC) Lab Results  Component Value Date   CREATININE 0.98 09/23/2020   Stable overall, cont to avoid nephrotoxins   Essential hypertension BP Readings from Last 3 Encounters:  09/27/20 140/80  09/23/20 (!) 177/89  09/23/20 (!) 222/114   Stable, pt to continue medical treatment amlodipine, labetolol, micardis, hct  Current Outpatient Medications (Endocrine & Metabolic):  Marland Kitchen  PREMARIN 0.625 MG tablet, TAKE ONE TABLET DAILY FOR 21 DAYS THEN DO NOT TAKE FOR 7 DAYS  Current Outpatient Medications (Cardiovascular):  .  amLODipine (NORVASC) 5 MG tablet, Take 1 tablet (5 mg total) by mouth daily. Marland Kitchen  labetalol (NORMODYNE) 300 MG tablet, TAKE ONE TABLET TWICE DAILY .  rosuvastatin (CRESTOR) 20 MG tablet, TAKE ONE TABLET DAILY .  telmisartan (MICARDIS) 80 MG tablet, TAKE ONE TABLET DAILY .  hydrochlorothiazide (HYDRODIURIL) 25 MG tablet, Take 1 tablet (25 mg total) by mouth daily. (Patient not taking: Reported on 09/27/2020)   Current Outpatient Medications (Analgesics):  .  HYDROcodone-acetaminophen (NORCO/VICODIN) 5-325 MG tablet, Take 1 tablet by mouth every 6 (six) hours as needed for moderate pain. (Patient not taking: Reported on 09/27/2020)   Current Outpatient Medications (Other):  Marland Kitchen  ALPRAZolam (XANAX) 0.5 MG tablet, TAKE ONE TABLET TWICE DAILY AS NEEDED .  amphetamine-dextroamphetamine (ADDERALL) 10 MG tablet, TAKE ONE TABLET TWICE DAILY .  ARIPiprazole (ABILIFY) 5 MG  tablet, TAKE ONE TABLET DAILY .  Cholecalciferol (THERA-D 2000) 50 MCG (2000 UT) TABS, 1 tab by mouth once daily .  donepezil (ARICEPT) 5 MG tablet, Take 1 tablet (5 mg total) by mouth at bedtime. .  Hypromellose (ARTIFICIAL TEARS OP), Place 1 drop into both eyes daily as needed (dry eyes). .  sertraline (ZOLOFT) 100 MG tablet, TAKE ONE TABLET TWICE DAILY .  doxycycline (VIBRA-TABS) 100 MG tablet, Take 1 tablet (100 mg total) by mouth 2 (two) times daily. (Patient not  taking: Reported on 09/27/2020) .  mupirocin ointment (BACTROBAN) 2 %, On leg wound w/dressing change qd or bid (Patient not taking: Reported on 09/27/2020) .  potassium chloride SA (K-DUR,KLOR-CON) 20 MEQ tablet, TAKE ONE TABLET TWICE DAILY (Patient not taking: Reported on 09/27/2020)   Grief D/w pt, declines referral for counseling or other tx  Hyperlipidemia Lab Results  Component Value Date   LDLCALC 71 05/05/2019   Stable, pt to continue current statin crestor  Hypothyroidism Lab Results  Component Value Date   TSH 4.01 05/05/2019   Stable, pt to continue levothyroxine   Mild cognitive impairment Pt agreeable for tx with aricept 5 qd  Vitamin D deficiency Last vitamin D Lab Results  Component Value Date   VD25OH 36.51 05/05/2019   Stable, cont oral replacement    Followup: Return in about 6 months (around 03/27/2021).  Cathlean Cower, MD 09/27/2020 8:06 PM [facility]

## 2020-09-27 NOTE — Assessment & Plan Note (Signed)
Lab Results  Component Value Date   CREATININE 0.98 09/23/2020   Stable overall, cont to avoid nephrotoxins

## 2020-09-27 NOTE — Assessment & Plan Note (Signed)
D/w pt, declines referral for counseling or other tx

## 2020-09-27 NOTE — Assessment & Plan Note (Signed)
Lab Results  Component Value Date   LDLCALC 71 05/05/2019   Stable, pt to continue current statin crestor

## 2020-09-27 NOTE — Assessment & Plan Note (Signed)
BP Readings from Last 3 Encounters:  09/27/20 140/80  09/23/20 (!) 177/89  09/23/20 (!) 222/114   Stable, pt to continue medical treatment amlodipine, labetolol, micardis, hct  Current Outpatient Medications (Endocrine & Metabolic):  Marland Kitchen  PREMARIN 0.625 MG tablet, TAKE ONE TABLET DAILY FOR 21 DAYS THEN DO NOT TAKE FOR 7 DAYS  Current Outpatient Medications (Cardiovascular):  .  amLODipine (NORVASC) 5 MG tablet, Take 1 tablet (5 mg total) by mouth daily. Marland Kitchen  labetalol (NORMODYNE) 300 MG tablet, TAKE ONE TABLET TWICE DAILY .  rosuvastatin (CRESTOR) 20 MG tablet, TAKE ONE TABLET DAILY .  telmisartan (MICARDIS) 80 MG tablet, TAKE ONE TABLET DAILY .  hydrochlorothiazide (HYDRODIURIL) 25 MG tablet, Take 1 tablet (25 mg total) by mouth daily. (Patient not taking: Reported on 09/27/2020)   Current Outpatient Medications (Analgesics):  .  HYDROcodone-acetaminophen (NORCO/VICODIN) 5-325 MG tablet, Take 1 tablet by mouth every 6 (six) hours as needed for moderate pain. (Patient not taking: Reported on 09/27/2020)   Current Outpatient Medications (Other):  Marland Kitchen  ALPRAZolam (XANAX) 0.5 MG tablet, TAKE ONE TABLET TWICE DAILY AS NEEDED .  amphetamine-dextroamphetamine (ADDERALL) 10 MG tablet, TAKE ONE TABLET TWICE DAILY .  ARIPiprazole (ABILIFY) 5 MG tablet, TAKE ONE TABLET DAILY .  Cholecalciferol (THERA-D 2000) 50 MCG (2000 UT) TABS, 1 tab by mouth once daily .  donepezil (ARICEPT) 5 MG tablet, Take 1 tablet (5 mg total) by mouth at bedtime. .  Hypromellose (ARTIFICIAL TEARS OP), Place 1 drop into both eyes daily as needed (dry eyes). .  sertraline (ZOLOFT) 100 MG tablet, TAKE ONE TABLET TWICE DAILY .  doxycycline (VIBRA-TABS) 100 MG tablet, Take 1 tablet (100 mg total) by mouth 2 (two) times daily. (Patient not taking: Reported on 09/27/2020) .  mupirocin ointment (BACTROBAN) 2 %, On leg wound w/dressing change qd or bid (Patient not taking: Reported on 09/27/2020) .  potassium chloride SA (K-DUR,KLOR-CON) 20  MEQ tablet, TAKE ONE TABLET TWICE DAILY (Patient not taking: Reported on 09/27/2020)

## 2020-10-25 ENCOUNTER — Ambulatory Visit (INDEPENDENT_AMBULATORY_CARE_PROVIDER_SITE_OTHER): Payer: PPO

## 2020-10-25 ENCOUNTER — Other Ambulatory Visit: Payer: Self-pay

## 2020-10-25 VITALS — BP 130/74 | HR 67 | Temp 98.0°F | Ht 63.0 in | Wt 141.6 lb

## 2020-10-25 DIAGNOSIS — Z0000 Encounter for general adult medical examination without abnormal findings: Secondary | ICD-10-CM | POA: Diagnosis not present

## 2020-10-25 NOTE — Patient Instructions (Signed)
Alyssa Fernandez , Thank you for taking time to come for your Medicare Wellness Visit. I appreciate your ongoing commitment to your health goals. Please review the following plan we discussed and let me know if I can assist you in the future.   Screening recommendations/referrals: Colonoscopy: not a candidate for colon cancer screening due to age 80: last done 06/09/2014 Bone Density: last done 06/10/2019; due every 2  years Recommended yearly ophthalmology/optometry visit for glaucoma screening and checkup Recommended yearly dental visit for hygiene and checkup  Vaccinations: Influenza vaccine: 09/27/2020 Pneumococcal vaccine: 06/12/2007, 05/22/2013 Tdap vaccine: 04/20/2017; due every 10 years Shingles vaccine: 02/15/2018, 04/10/2018   Covid-19: 01/03/2020, 01/24/2020, 09/16/2020  Advanced directives: Please bring a copy of your health care power of attorney and living will to the office at your convenience.  Conditions/risks identified: Yes; Reviewed health maintenance screenings with patient today and relevant education, vaccines, and/or referrals were provided. Please continue to do your personal lifestyle choices by: daily care of teeth and gums, regular physical activity (goal should be 5 days a week for 30 minutes), eat a healthy diet, avoid tobacco and drug use, limiting any alcohol intake, taking a low-dose aspirin (if not allergic or have been advised by your provider otherwise) and taking vitamins and minerals as recommended by your provider. Continue doing brain stimulating activities (puzzles, reading, adult coloring books, staying active) to keep memory sharp. Continue to eat heart healthy diet (full of fruits, vegetables, whole grains, lean protein, water--limit salt, fat, and sugar intake) and increase physical activity as tolerated.  Next appointment: Please schedule your next Medicare Wellness Visit with your Nurse Health Advisor in 1 year by calling [phone].  Preventive Care 61  Years and Older, Female Preventive care refers to lifestyle choices and visits with your health care provider that can promote health and wellness. What does preventive care include?  A yearly physical exam. This is also called an annual well check.  Dental exams once or twice a year.  Routine eye exams. Ask your health care provider how often you should have your eyes checked.  Personal lifestyle choices, including:  Daily care of your teeth and gums.  Regular physical activity.  Eating a healthy diet.  Avoiding tobacco and drug use.  Limiting alcohol use.  Practicing safe sex.  Taking low-dose aspirin every day.  Taking vitamin and mineral supplements as recommended by your health care provider. What happens during an annual well check? The services and screenings done by your health care provider during your annual well check will depend on your age, overall health, lifestyle risk factors, and family history of disease. Counseling  Your health care provider may ask you questions about your:  Alcohol use.  Tobacco use.  Drug use.  Emotional well-being.  Home and relationship well-being.  Sexual activity.  Eating habits.  History of falls.  Memory and ability to understand (cognition).  Work and work Statistician.  Reproductive health. Screening  You may have the following tests or measurements:  Height, weight, and BMI.  Blood pressure.  Lipid and cholesterol levels. These may be checked every 5 years, or more frequently if you are over 26 years old.  Skin check.  Lung cancer screening. You may have this screening every year starting at age 26 if you have a 30-pack-year history of smoking and currently smoke or have quit within the past 15 years.  Fecal occult blood test (FOBT) of the stool. You may have this test every year starting at age 71.  Flexible sigmoidoscopy or colonoscopy. You may have a sigmoidoscopy every 5 years or a colonoscopy every  10 years starting at age 28.  Hepatitis C blood test.  Hepatitis B blood test.  Sexually transmitted disease (STD) testing.  Diabetes screening. This is done by checking your blood sugar (glucose) after you have not eaten for a while (fasting). You may have this done every 1-3 years.  Bone density scan. This is done to screen for osteoporosis. You may have this done starting at age 50.  Mammogram. This may be done every 1-2 years. Talk to your health care provider about how often you should have regular mammograms. Talk with your health care provider about your test results, treatment options, and if necessary, the need for more tests. Vaccines  Your health care provider may recommend certain vaccines, such as:  Influenza vaccine. This is recommended every year.  Tetanus, diphtheria, and acellular pertussis (Tdap, Td) vaccine. You may need a Td booster every 10 years.  Zoster vaccine. You may need this after age 66.  Pneumococcal 13-valent conjugate (PCV13) vaccine. One dose is recommended after age 45.  Pneumococcal polysaccharide (PPSV23) vaccine. One dose is recommended after age 48. Talk to your health care provider about which screenings and vaccines you need and how often you need them. This information is not intended to replace advice given to you by your health care provider. Make sure you discuss any questions you have with your health care provider. Document Released: 09/03/2015 Document Revised: 04/26/2016 Document Reviewed: 06/08/2015 Elsevier Interactive Patient Education  2017 [place] Prevention in the Home Falls can cause injuries. They can happen to people of all ages. There are many things you can do to make your home safe and to help prevent falls. What can I do on the outside of my home?  Regularly fix the edges of walkways and driveways and fix any cracks.  Remove anything that might make you trip as you walk through a door, such as a raised step  or threshold.  Trim any bushes or trees on the path to your home.  Use bright outdoor lighting.  Clear any walking paths of anything that might make someone trip, such as rocks or tools.  Regularly check to see if handrails are loose or broken. Make sure that both sides of any steps have handrails.  Any raised decks and porches should have guardrails on the edges.  Have any leaves, snow, or ice cleared regularly.  Use sand or salt on walking paths during winter.  Clean up any spills in your garage right away. This includes oil or grease spills. What can I do in the bathroom?  Use night lights.  Install grab bars by the toilet and in the tub and shower. Do not use towel bars as grab bars.  Use non-skid mats or decals in the tub or shower.  If you need to sit down in the shower, use a plastic, non-slip stool.  Keep the floor dry. Clean up any water that spills on the floor as soon as it happens.  Remove soap buildup in the tub or shower regularly.  Attach bath mats securely with double-sided non-slip rug tape.  Do not have throw rugs and other things on the floor that can make you trip. What can I do in the bedroom?  Use night lights.  Make sure that you have a light by your bed that is easy to reach.  Do not use any sheets or blankets  that are too big for your bed. They should not hang down onto the floor.  Have a firm chair that has side arms. You can use this for support while you get dressed.  Do not have throw rugs and other things on the floor that can make you trip. What can I do in the kitchen?  Clean up any spills right away.  Avoid walking on wet floors.  Keep items that you use a lot in easy-to-reach places.  If you need to reach something above you, use a strong step stool that has a grab bar.  Keep electrical cords out of the way.  Do not use floor polish or wax that makes floors slippery. If you must use wax, use non-skid floor wax.  Do not have  throw rugs and other things on the floor that can make you trip. What can I do with my stairs?  Do not leave any items on the stairs.  Make sure that there are handrails on both sides of the stairs and use them. Fix handrails that are broken or loose. Make sure that handrails are as long as the stairways.  Check any carpeting to make sure that it is firmly attached to the stairs. Fix any carpet that is loose or worn.  Avoid having throw rugs at the top or bottom of the stairs. If you do have throw rugs, attach them to the floor with carpet tape.  Make sure that you have a light switch at the top of the stairs and the bottom of the stairs. If you do not have them, ask someone to add them for you. What else can I do to help prevent falls?  Wear shoes that:  Do not have high heels.  Have rubber bottoms.  Are comfortable and fit you well.  Are closed at the toe. Do not wear sandals.  If you use a stepladder:  Make sure that it is fully opened. Do not climb a closed stepladder.  Make sure that both sides of the stepladder are locked into place.  Ask someone to hold it for you, if possible.  Clearly mark and make sure that you can see:  Any grab bars or handrails.  First and last steps.  Where the edge of each step is.  Use tools that help you move around (mobility aids) if they are needed. These include:  Canes.  Walkers.  Scooters.  Crutches.  Turn on the lights when you go into a dark area. Replace any light bulbs as soon as they burn out.  Set up your furniture so you have a clear path. Avoid moving your furniture around.  If any of your floors are uneven, fix them.  If there are any pets around you, be aware of where they are.  Review your medicines with your doctor. Some medicines can make you feel dizzy. This can increase your chance of falling. Ask your doctor what other things that you can do to help prevent falls. This information is not intended to  replace advice given to you by your health care provider. Make sure you discuss any questions you have with your health care provider. Document Released: 06/03/2009 Document Revised: 01/13/2016 Document Reviewed: 09/11/2014 Elsevier Interactive Patient Education  2017 [employer].

## 2020-10-25 NOTE — Progress Notes (Signed)
Subjective:   Alyssa Fernandez is a 80 y.o. female who presents for Medicare Annual (Subsequent) preventive examination.  Review of Systems    No ROS. Medicare Wellness Visit. Additional risk factors are reflected in social history. Cardiac Risk Factors include: advanced age (>57men, >75 women);dyslipidemia;hypertension;family history of premature cardiovascular disease     Objective:    Today's Vitals   10/25/20 0900  BP: 130/74  Pulse: 67  Temp: 98 F (36.7 C)  SpO2: 92%  Weight: 141 lb 9.6 oz (64.2 kg)  Height: 5\' 3"  (1.6 m)  PainSc: 0-No pain   Body mass index is 25.08 kg/m.  Advanced Directives 10/25/2020 03/29/2018 04/13/2017 05/06/2012  Does Patient Have a Medical Advance Directive? Yes Yes No Patient has advance directive, copy not in chart  Type of Advance Directive Living will;Healthcare Power of [place];Living will - [place];Living will  Does patient want to make changes to medical advance directive? No - Patient declined - - -  Copy of [place] in Chart? No - copy requested No - copy requested - Copy requested from family  Would patient like information on creating a medical advance directive? - - No - Patient declined -    Current Medications (verified) Outpatient Encounter Medications as of 10/25/2020  Medication Sig   ALPRAZolam (XANAX) 0.5 MG tablet TAKE ONE TABLET TWICE DAILY AS NEEDED   amLODipine (NORVASC) 5 MG tablet Take 1 tablet (5 mg total) by mouth daily.   amphetamine-dextroamphetamine (ADDERALL) 10 MG tablet TAKE ONE TABLET TWICE DAILY   ARIPiprazole (ABILIFY) 5 MG tablet TAKE ONE TABLET DAILY   Cholecalciferol (THERA-D 2000) 50 MCG (2000 UT) TABS 1 tab by mouth once daily   donepezil (ARICEPT) 5 MG tablet Take 1 tablet (5 mg total) by mouth at bedtime.   doxycycline (VIBRA-TABS) 100 MG tablet Take 1 tablet (100 mg total) by mouth 2 (two) times daily. (Patient not taking:  Reported on 09/27/2020)   hydrochlorothiazide (HYDRODIURIL) 25 MG tablet Take 1 tablet (25 mg total) by mouth daily. (Patient not taking: Reported on 09/27/2020)   HYDROcodone-acetaminophen (NORCO/VICODIN) 5-325 MG tablet Take 1 tablet by mouth every 6 (six) hours as needed for moderate pain. (Patient not taking: Reported on 09/27/2020)   Hypromellose (ARTIFICIAL TEARS OP) Place 1 drop into both eyes daily as needed (dry eyes).   labetalol (NORMODYNE) 300 MG tablet TAKE ONE TABLET TWICE DAILY   mupirocin ointment (BACTROBAN) 2 % On leg wound w/dressing change qd or bid (Patient not taking: Reported on 09/27/2020)   potassium chloride SA (K-DUR,KLOR-CON) 20 MEQ tablet TAKE ONE TABLET TWICE DAILY (Patient not taking: Reported on 09/27/2020)   PREMARIN 0.625 MG tablet TAKE ONE TABLET DAILY FOR 21 DAYS THEN DO NOT TAKE FOR 7 DAYS   rosuvastatin (CRESTOR) 20 MG tablet TAKE ONE TABLET DAILY   sertraline (ZOLOFT) 100 MG tablet TAKE ONE TABLET TWICE DAILY   telmisartan (MICARDIS) 80 MG tablet TAKE ONE TABLET DAILY   No facility-administered encounter medications on file as of 10/25/2020.    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     bladder operation, but per sling procedure     BUNIONECTOMY     CERVICAL DISC SURGERY     fx rt ankle     left temporal bx artery-negative 2      PARTIAL NEPHRECTOMY     percutaneous drainage of liver abscess  05/06/2012    Family History  Problem Relation Age of Onset   Stroke Mother    Social History   Socioeconomic History   Marital status: Widowed    Spouse name: Not on file   Number of children: 1   Years of education: Not on file   Highest education level: Not on file  Occupational History   Occupation: Retired  Tobacco Use   Smoking status: Never Smoker   Smokeless tobacco: Never Used  Scientific laboratory technician Use: Never used  Substance and Sexual Activity   Alcohol use: No   Drug use: No   Sexual activity: Not Currently    Birth control/protection: Post-menopausal  Other Topics Concern   Not on file  Social History Narrative   Widowed since December 2021; married for over 85 years.   Lives in a 2-story home with cat.   Social Determinants of Health   Financial Resource Strain: Low Risk    Difficulty of Paying Living Expenses: Not hard at all  Food Insecurity: No Food Insecurity   Worried About Charity fundraiser in the Last Year: Never true   [place] in the Last Year: Never true  Transportation Needs: No Transportation Needs   Lack of Transportation (Medical): No   Lack of Transportation (Non-Medical): No  Physical Activity: Sufficiently Active   Days of Exercise per Week: 5 days   Minutes of Exercise per Session: 30 min  Stress: No Stress Concern Present   Feeling of Stress : Not at all  Social Connections: Moderately Integrated   Frequency of Communication with Friends and Family: More than three times a week   Frequency of Social Gatherings with Friends and Family: Once a week   Attends Religious Services: More than 4 times per year   Active Member of [employer] or Organizations: No   Attends Music therapist: More than 4 times per year   Marital Status: Widowed    Tobacco Counseling Counseling given: Not Answered   Clinical Intake:  Pre-visit preparation completed: Yes  Pain : No/denies pain Pain Score: 0-No pain      BMI - recorded: 25.08 Nutritional Status: BMI 25 -29 Overweight Nutritional Risks: None Diabetes: No  How often do you need to have someone help you when you read instructions, pamphlets, or other written materials from your doctor or pharmacy?: 1 - Never What is the last grade level you completed in school?: College Degree  Diabetic? no  Interpreter Needed?: No  Information entered by :: Patryk Conant N. Scarlettrose Costilow, LPN   Activities of Daily Living In your present state of health, do you have any difficulty performing the following activities: 10/25/2020 05/26/2020  Hearing? N N  Vision? N N  Difficulty concentrating or making decisions? Y N  Walking or climbing stairs? N N  Dressing or bathing? N N  Doing errands, shopping? N N  Preparing Food and eating ?  N -  Using the Toilet? N -  In the past six months, have you accidently leaked urine? Y -  Do you have problems with loss of bowel control? N -  Managing your Medications? N -  Managing your Finances? N -  Housekeeping or managing your Housekeeping? N -  Some recent data might be hidden    Patient Care Team: Biagio Borg, MD as PCP - General (Internal Medicine)  Indicate any recent Medical Services you may have received from other than [facility] providers in the past year (date may be approximate).     Assessment:   This is a routine wellness examination for Nichoel.  Hearing/Vision screen No exam data present  Dietary issues and exercise activities discussed: Current Exercise Habits: Home exercise routine, Type of exercise: walking, Time (Minutes): 30, Frequency (Times/Week): 5, Weekly Exercise (Minutes/Week): 150, Intensity: Mild, Exercise limited by: orthopedic condition(s)  Goals     Patient Stated     Take as much time as possible for myself while continuing to take care of my husband.      Depression Screen PHQ 2/9 Scores 10/25/2020 05/26/2020 05/08/2019 03/29/2018 01/30/2018 08/01/2017 05/04/2016  PHQ - 2 Score 2 0 1 1  1  0 1  PHQ- 9 Score - - - 4 - - -    Fall Risk Fall Risk  10/25/2020 12/17/2019 05/08/2019 03/29/2018 01/30/2018  Falls in the past year? 0 1 0 No No  Number falls in past yr: 0 0 - - -  Injury with Fall? 0 1 - - -  Risk for fall due to : No Fall Risks History of fall(s) - - -  Follow up Falls evaluation completed Falls evaluation completed - - -    FALL RISK PREVENTION PERTAINING TO THE HOME:  Any stairs in or around the home? Yes  If so, are there any without handrails? No  Home free of loose throw rugs in walkways, pet beds, electrical cords, etc? Yes  Adequate lighting in your home to reduce risk of falls? Yes   ASSISTIVE DEVICES UTILIZED TO PREVENT FALLS:  Life alert? No  Use of a cane, walker or w/c? No  Grab bars in the bathroom? Yes  Shower chair or bench in shower? Yes  Elevated toilet seat or a handicapped toilet? Yes   TIMED UP AND GO:  Was the test performed? No .  Length of time to ambulate 10 feet: 0 sec.   Gait steady and fast without use of assistive device  Cognitive Function :Patient has current diagnosis of mild cognitive impairment.   MMSE - Mini Mental State Exam 03/29/2018  Orientation to time 5  Orientation to Place 5  Registration 3  Attention/ Calculation 4  Recall 1  Language- name 2 objects 2  Language- repeat 1  Language- follow 3 step command 3  Language- read & follow direction 1  Write a sentence 1  Copy design 1  Total score 27        Immunizations Immunization History  Administered Date(s) Administered   Fluad Quad(high Dose 65+) 04/19/2019, 09/27/2020   Influenza Split 06/02/2011, 05/21/2012   Influenza Whole 06/12/2007   Influenza, High Dose Seasonal PF 04/20/2017   Influenza,inj,Quad PF,6+ Mos 05/22/2013, 04/08/2014, 06/03/2015, 05/04/2016   Influenza,inj,quad, With Preservative 05/21/2017   PFIZER Comirnaty(Gray Top)Covid-19 Tri-Sucrose Vaccine 01/03/2020, 01/24/2020, 09/16/2020   Pneumococcal Conjugate-13 05/22/2013    Pneumococcal Polysaccharide-23 06/12/2007   Td 10/16/2006   Tdap 04/20/2017   Zoster 10/16/2006   Zoster Recombinat (  Shingrix) 02/15/2018, 04/10/2018    TDAP status: Up to date  Flu Vaccine status: Up to date  Pneumococcal vaccine status: Up to date  Covid-19 vaccine status: Completed vaccines  Qualifies for Shingles Vaccine? Yes   Zostavax completed Yes   Shingrix Completed?: Yes  Screening Tests Health Maintenance  Topic Date Due   TETANUS/TDAP  04/21/2027   INFLUENZA VACCINE  Completed   DEXA SCAN  Completed   COVID-19 Vaccine  Completed   PNA vac Low Risk Adult  Completed   HPV VACCINES  Aged Out    Health Maintenance  There are no preventive care reminders to display for this patient.  Colorectal cancer screening: No longer required.   Mammogram status: No longer required due to age.  Bone Density status: Completed 06/10/2019. Results reflect: Bone density results: OSTEOPENIA. Repeat every 2 years.  Lung Cancer Screening: (Low Dose CT Chest recommended if Age 65-80 years, 30 pack-year currently smoking OR have quit w/in 15years.) does not qualify.   Lung Cancer Screening Referral: no  Additional Screening:  Hepatitis C Screening: does not qualify; Completed no  Vision Screening: Recommended annual ophthalmology exams for early detection of glaucoma and other disorders of the eye. Is the patient up to date with their annual eye exam?  No  Who is the provider or what is the name of the office in which the patient attends annual eye exams? Patient refused If pt is not established with a provider, would they like to be referred to a provider to establish care? No .   Dental Screening: Recommended annual dental exams for proper oral hygiene  Community Resource Referral / Chronic Care Management: CRR required this visit?  No   CCM required this visit?  No      Plan:     I have personally reviewed and noted the following in the patients  chart:    Medical and social history  Use of alcohol, tobacco or illicit drugs   Current medications and supplements  Functional ability and status  Nutritional status  Physical activity  Advanced directives  List of other physicians  Hospitalizations, surgeries, and ER visits in previous 12 months  Vitals  Screenings to include cognitive, depression, and falls  Referrals and appointments  In addition, I have reviewed and discussed with patient certain preventive protocols, quality metrics, and best practice recommendations. A written personalized care plan for preventive services as well as general preventive health recommendations were provided to patient.     Sheral Flow, LPN   3/0/0923   Nurse Notes:  Medications reviewed with patient; opioid use noted.

## 2020-10-28 ENCOUNTER — Other Ambulatory Visit: Payer: Self-pay | Admitting: Internal Medicine

## 2021-01-18 ENCOUNTER — Other Ambulatory Visit: Payer: Self-pay | Admitting: Internal Medicine

## 2021-01-27 ENCOUNTER — Encounter: Payer: Self-pay | Admitting: Internal Medicine

## 2021-01-27 DIAGNOSIS — L7211 Pilar cyst: Secondary | ICD-10-CM | POA: Diagnosis not present

## 2021-01-27 DIAGNOSIS — D0471 Carcinoma in situ of skin of right lower limb, including hip: Secondary | ICD-10-CM | POA: Diagnosis not present

## 2021-02-10 ENCOUNTER — Other Ambulatory Visit: Payer: Self-pay | Admitting: Internal Medicine

## 2021-04-01 ENCOUNTER — Other Ambulatory Visit: Payer: Self-pay | Admitting: Internal Medicine

## 2021-04-01 NOTE — Telephone Encounter (Signed)
Please refill as per office routine med refill policy (all routine meds refilled for 3 mo or monthly per pt preference up to one year from last visit, then month to month grace period for 3 mo, then further med refills will have to be denied)  

## 2021-04-09 ENCOUNTER — Other Ambulatory Visit: Payer: Self-pay | Admitting: Internal Medicine

## 2021-05-02 ENCOUNTER — Telehealth: Payer: Self-pay | Admitting: Internal Medicine

## 2021-05-02 NOTE — Telephone Encounter (Signed)
Patient would like how often she should get her shingrix vaccine. Her last vaccine was 03/2018.   Please advise.

## 2021-05-02 NOTE — Telephone Encounter (Signed)
Only 2 shots total needed  No further shingrix is needed

## 2021-05-04 NOTE — Telephone Encounter (Signed)
Patient notified

## 2021-05-09 ENCOUNTER — Other Ambulatory Visit: Payer: Self-pay | Admitting: Internal Medicine

## 2021-05-12 ENCOUNTER — Other Ambulatory Visit: Payer: Self-pay

## 2021-05-13 ENCOUNTER — Other Ambulatory Visit: Payer: Self-pay | Admitting: Internal Medicine

## 2021-05-13 ENCOUNTER — Encounter: Payer: Self-pay | Admitting: Internal Medicine

## 2021-05-13 ENCOUNTER — Ambulatory Visit (INDEPENDENT_AMBULATORY_CARE_PROVIDER_SITE_OTHER): Payer: PPO | Admitting: Internal Medicine

## 2021-05-13 VITALS — BP 110/68 | HR 58 | Ht 63.0 in | Wt 147.0 lb

## 2021-05-13 DIAGNOSIS — M810 Age-related osteoporosis without current pathological fracture: Secondary | ICD-10-CM | POA: Diagnosis not present

## 2021-05-13 DIAGNOSIS — Z23 Encounter for immunization: Secondary | ICD-10-CM | POA: Diagnosis not present

## 2021-05-13 DIAGNOSIS — E039 Hypothyroidism, unspecified: Secondary | ICD-10-CM | POA: Diagnosis not present

## 2021-05-13 DIAGNOSIS — G8929 Other chronic pain: Secondary | ICD-10-CM | POA: Diagnosis not present

## 2021-05-13 DIAGNOSIS — F32A Depression, unspecified: Secondary | ICD-10-CM

## 2021-05-13 DIAGNOSIS — F419 Anxiety disorder, unspecified: Secondary | ICD-10-CM | POA: Diagnosis not present

## 2021-05-13 DIAGNOSIS — F4321 Adjustment disorder with depressed mood: Secondary | ICD-10-CM | POA: Diagnosis not present

## 2021-05-13 DIAGNOSIS — M545 Low back pain, unspecified: Secondary | ICD-10-CM

## 2021-05-13 DIAGNOSIS — G3184 Mild cognitive impairment, so stated: Secondary | ICD-10-CM

## 2021-05-13 DIAGNOSIS — I10 Essential (primary) hypertension: Secondary | ICD-10-CM | POA: Diagnosis not present

## 2021-05-13 DIAGNOSIS — E7849 Other hyperlipidemia: Secondary | ICD-10-CM

## 2021-05-13 LAB — URINALYSIS, ROUTINE W REFLEX MICROSCOPIC
Bilirubin Urine: NEGATIVE
Hgb urine dipstick: NEGATIVE
Ketones, ur: NEGATIVE
Nitrite: NEGATIVE
Specific Gravity, Urine: 1.01 (ref 1.000–1.030)
Total Protein, Urine: NEGATIVE
Urine Glucose: NEGATIVE
Urobilinogen, UA: 0.2 (ref 0.0–1.0)
pH: 7 (ref 5.0–8.0)

## 2021-05-13 LAB — VITAMIN D 25 HYDROXY (VIT D DEFICIENCY, FRACTURES): VITD: 43.96 ng/mL (ref 30.00–100.00)

## 2021-05-13 LAB — CBC WITH DIFFERENTIAL/PLATELET
Basophils Absolute: 0 10*3/uL (ref 0.0–0.1)
Basophils Relative: 0.5 % (ref 0.0–3.0)
Eosinophils Absolute: 0.3 10*3/uL (ref 0.0–0.7)
Eosinophils Relative: 4.8 % (ref 0.0–5.0)
HCT: 37.8 % (ref 36.0–46.0)
Hemoglobin: 12.7 g/dL (ref 12.0–15.0)
Lymphocytes Relative: 18.7 % (ref 12.0–46.0)
Lymphs Abs: 1.1 10*3/uL (ref 0.7–4.0)
MCHC: 33.7 g/dL (ref 30.0–36.0)
MCV: 87.7 fl (ref 78.0–100.0)
Monocytes Absolute: 0.6 10*3/uL (ref 0.1–1.0)
Monocytes Relative: 9.7 % (ref 3.0–12.0)
Neutro Abs: 3.9 10*3/uL (ref 1.4–7.7)
Neutrophils Relative %: 66.3 % (ref 43.0–77.0)
Platelets: 226 10*3/uL (ref 150.0–400.0)
RBC: 4.3 Mil/uL (ref 3.87–5.11)
RDW: 13.8 % (ref 11.5–15.5)
WBC: 5.9 10*3/uL (ref 4.0–10.5)

## 2021-05-13 LAB — HEPATIC FUNCTION PANEL
ALT: 13 U/L (ref 0–35)
AST: 19 U/L (ref 0–37)
Albumin: 4.3 g/dL (ref 3.5–5.2)
Alkaline Phosphatase: 51 U/L (ref 39–117)
Bilirubin, Direct: 0.1 mg/dL (ref 0.0–0.3)
Total Bilirubin: 0.5 mg/dL (ref 0.2–1.2)
Total Protein: 6.8 g/dL (ref 6.0–8.3)

## 2021-05-13 LAB — LIPID PANEL
Cholesterol: 147 mg/dL (ref 0–200)
HDL: 55.7 mg/dL (ref >39.00)
LDL Cholesterol: 70 mg/dL (ref 0–99)
NonHDL: 90.97
Total CHOL/HDL Ratio: 3
Triglycerides: 103 mg/dL (ref 0.0–149.0)
VLDL: 20.6 mg/dL (ref 0.0–40.0)

## 2021-05-13 LAB — BASIC METABOLIC PANEL
BUN: 21 mg/dL (ref 6–23)
CO2: 31 mEq/L (ref 19–32)
Calcium: 9.1 mg/dL (ref 8.4–10.5)
Chloride: 95 mEq/L — ABNORMAL LOW (ref 96–112)
Creatinine, Ser: 1.16 mg/dL (ref 0.40–1.20)
GFR: 44.03 mL/min — ABNORMAL LOW (ref >60.00)
Glucose, Bld: 89 mg/dL (ref 70–99)
Potassium: 3.7 mEq/L (ref 3.5–5.1)
Sodium: 134 mEq/L — ABNORMAL LOW (ref 135–145)

## 2021-05-13 LAB — PHOSPHORUS: Phosphorus: 3.7 mg/dL (ref 2.3–4.6)

## 2021-05-13 LAB — VITAMIN B12: Vitamin B-12: 208 pg/mL — ABNORMAL LOW (ref 211–911)

## 2021-05-13 LAB — TSH: TSH: 2.62 u[IU]/mL (ref 0.35–5.50)

## 2021-05-13 MED ORDER — HYDROCODONE-ACETAMINOPHEN 5-325 MG PO TABS
1.0000 | ORAL_TABLET | Freq: Four times a day (QID) | ORAL | 0 refills | Status: DC | PRN
Start: 2021-05-13 — End: 2021-12-12

## 2021-05-13 MED ORDER — CEPHALEXIN 500 MG PO CAPS: 500.0000 mg | ORAL_CAPSULE | Freq: Three times a day (TID) | ORAL | 0 refills | Status: AC

## 2021-05-13 MED ORDER — VITAMIN B-12 1000 MCG PO TABS: 1000.0000 ug | ORAL_TABLET | Freq: Every day | ORAL | 3 refills | Status: AC

## 2021-05-13 NOTE — Progress Notes (Signed)
Patient ID: Alyssa Fernandez, female   DOB: 16-Sep-1940, 80 y.o.   MRN: [id number]        Chief Complaint: follow up HTN, HLD, hypothyoridism, cognitive impairment, chronic lbp, and grief       HPI:  Alyssa Fernandez is a 80 y.o. female here overall not doing as well, as husband recently deceased, now with increased anxiety/depresisve symptoms with low mood, tearful, nervous, and fatigue worsening in past month.  Does not necessarily want med change, but is open to referral for counseling.  Asks if contd premarin is needed.  Pt continues to have recurring LBP without change in severity, bowel or bladder change, fever, wt loss,  worsening LE pain/numbness/weakness, gait change or falls.Denies hyper or hypo thyroid symptoms such as voice, skin or hair change. Pt denies chest pain, increased sob or doe, wheezing, orthopnea, PND, increased LE swelling, palpitations, dizziness or syncope.   Pt denies polydipsia, polyuria, or new focal neuro s/s.  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 She has never used smokeless tobacco. She reports that she does not drink alcohol and does not use drugs. family history includes Stroke in her mother. Allergies  Allergen Reactions   Codeine Swelling   Sulfonamide Derivatives Swelling   Zocor [Simvastatin] Other (See Comments)    Cognitive decrease   Current Outpatient Medications on File Prior to Visit  Medication Sig Dispense Refill   ALPRAZolam (XANAX) 0.5 MG tablet TAKE ONE TABLET TWICE DAILY AS NEEDED 60 tablet 5   amLODipine (NORVASC) 5 MG tablet TAKE ONE TABLET DAILY 90 tablet 1   amphetamine-dextroamphetamine (ADDERALL) 10 MG tablet TAKE ONE TABLET TWICE DAILY 60 tablet 0   ARIPiprazole (ABILIFY) 5 MG tablet TAKE ONE TABLET DAILY 90 tablet 3   Cholecalciferol (THERA-D 2000) 50 MCG (2000 UT) TABS 1 tab by mouth once daily 90 tablet 99   donepezil (ARICEPT) 5 MG tablet Take 1 tablet (5 mg total) by mouth at bedtime. 90 tablet 3   hydrochlorothiazide (HYDRODIURIL) 25 MG tablet TAKE ONE TABLET DAILY 90 tablet 0   Hypromellose (ARTIFICIAL TEARS OP) Place 1 drop into both eyes daily  as needed (dry eyes).     labetalol (NORMODYNE) 300 MG tablet TAKE ONE TABLET TWICE DAILY 180 tablet 1   mupirocin ointment (BACTROBAN) 2 % On leg wound w/dressing change qd or bid 30 g 0   potassium chloride SA (K-DUR,KLOR-CON) 20 MEQ tablet TAKE ONE TABLET TWICE DAILY 60 tablet 11   rosuvastatin (CRESTOR) 20 MG tablet TAKE ONE TABLET DAILY 90 tablet 3   sertraline (ZOLOFT) 100 MG tablet TAKE ONE TABLET TWICE DAILY 180 tablet 3   telmisartan (MICARDIS) 80 MG tablet TAKE ONE TABLET DAILY 90 tablet 3   No current facility-administered medications on file prior to visit.        ROS:  All others reviewed and negative.  Objective        PE:  BP 110/68 (BP Location: Right Arm, Patient Position: Sitting, Cuff Size: Normal)   Pulse (!) 58   Ht 5\' 3"  (1.6 m)   Wt 147  lb (66.7 kg)   SpO2 95%   BMI 26.04 kg/m                 Constitutional: Pt appears in NAD               HENT: Head: NCAT.                Right Ear: External ear normal.                 Left Ear: External ear normal.                Eyes: . Pupils are equal, round, and reactive to light. Conjunctivae and EOM are normal               Nose: without d/c or deformity               Neck: Neck supple. Gross normal ROM               Cardiovascular: Normal rate and regular rhythm.                 Pulmonary/Chest: Effort normal and breath sounds without rales or wheezing.                Abd:  Soft, NT, ND, + BS, no organomegaly               Neurological: Pt is alert. At baseline orientation, motor grossly intact               Skin: Skin is warm. No rashes, no other new lesions, LE edema - none               Psychiatric: Pt behavior is normal without agitation , depressed, nervous affect  Micro: none  Cardiac tracings I have personally interpreted today:  none  Pertinent Radiological findings (summarize): none   Lab Results  Component Value Date   WBC 5.9 05/13/2021   HGB 12.7 05/13/2021   HCT 37.8 05/13/2021   PLT 226.0 05/13/2021   GLUCOSE 89 05/13/2021   CHOL 147 05/13/2021   TRIG 103.0 05/13/2021   HDL 55.70 05/13/2021   LDLCALC 70 05/13/2021   ALT 13 05/13/2021   AST 19 05/13/2021   NA 134 (L) 05/13/2021   K 3.7 05/13/2021   CL 95 (L) 05/13/2021   CREATININE 1.16 05/13/2021   BUN 21 05/13/2021   CO2 31 05/13/2021   TSH 2.62 05/13/2021   INR 1.14 04/14/2017   HGBA1C 5.9 05/19/2008  Assessment/Plan:  Alyssa Fernandez is a 80 y.o. 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  Chronic low  back pain With mild recent worsening per pt - for f/u Dr Gloris [place] per pt  Mild cognitive impairment Difficult to assess given depression anxiety, continue to follow  Hypothyroidism Lab Results  Component Value Date   TSH 2.62 05/13/2021   Stable, pt to continue levothyroxine   Hyperlipidemia Lab Results  Component Value Date   LDLCALC 70 05/13/2021   Mild uncontrolled, goal ldl < 70, pt to continue current statin crestor as declines increase   Grief Mild worsening, to continue zoloft, ability - also refer counseling  Essential hypertension BP Readings from Last 3 Encounters:  05/13/21 110/68  10/25/20 130/74  09/27/20 140/80   Stable, pt to continue medical treatment norvasc, hct, labetolol, micardis   Current Outpatient Medications (Cardiovascular):    amLODipine (NORVASC) 5 MG tablet, TAKE ONE TABLET DAILY   hydrochlorothiazide (HYDRODIURIL) 25 MG tablet, TAKE ONE TABLET DAILY   labetalol (NORMODYNE) 300 MG tablet, TAKE ONE TABLET TWICE DAILY   rosuvastatin (CRESTOR) 20 MG tablet, TAKE ONE TABLET DAILY   telmisartan (MICARDIS) 80 MG tablet, TAKE ONE TABLET DAILY   Current Outpatient Medications (Analgesics):    HYDROcodone-acetaminophen (NORCO/VICODIN) 5-325 MG tablet, Take 1 tablet by mouth every 6 (six) hours as needed for moderate pain.  Current Outpatient Medications (Hematological):    vitamin B-12 (CYANOCOBALAMIN) 1000 MCG tablet, Take 1 tablet (1,000 mcg total) by mouth daily.  Current Outpatient Medications (Other):    ALPRAZolam (XANAX) 0.5 MG tablet, TAKE ONE TABLET TWICE DAILY AS NEEDED   amphetamine-dextroamphetamine (ADDERALL) 10 MG tablet, TAKE ONE TABLET TWICE DAILY   ARIPiprazole (ABILIFY) 5 MG tablet, TAKE ONE TABLET DAILY   Cholecalciferol (THERA-D 2000) 50 MCG (2000 UT) TABS, 1 tab by mouth once daily   donepezil (ARICEPT) 5 MG tablet, Take 1 tablet (5 mg total) by mouth at bedtime.   Hypromellose (ARTIFICIAL TEARS OP), Place 1 drop into  both eyes daily as needed (dry eyes).   mupirocin ointment (BACTROBAN) 2 %, On leg wound w/dressing change qd or bid   potassium chloride SA (K-DUR,KLOR-CON) 20 MEQ tablet, TAKE ONE TABLET TWICE DAILY   sertraline (ZOLOFT) 100 MG tablet, TAKE ONE TABLET TWICE DAILY   cephALEXin (KEFLEX) 500 MG capsule, Take 1 capsule (500 mg total) by mouth 3 (three) times daily for 10 days.   Osteoporosis Ok for d/c premarin  Followup: Return in about 6 months (around 11/10/2021).  Cathlean Cower, MD 05/16/2021 9:09 PM [facility]

## 2021-05-13 NOTE — Patient Instructions (Addendum)
You had the flu shot today  Ok to stop the premarin  Please continue all other medications as before, and refills have been done if requested - including the hydrocodone pain medication to only take if you need it  Please have the pharmacy call with any other refills you may need.  Please continue your efforts at being more active, low cholesterol diet, and weight control..  Please keep your appointments with your specialists as you may have planned  You will be contacted regarding the referral for: Dr Nelva Bush Rosanne Gutting, and Psychology (counseling)  Please go to the LAB at the blood drawing area for the tests to be done  You will be contacted by phone if any changes need to be made immediately.  Otherwise, you will receive a letter about your results with an explanation, but please check with MyChart first.  Please remember to sign up for MyChart if you have not done so, as this will be important to you in the future with finding out test results, communicating by private email, and scheduling acute appointments online when needed.  Please make an Appointment to return in 6 months, or sooner if needed

## 2021-05-16 ENCOUNTER — Encounter: Payer: Self-pay | Admitting: Internal Medicine

## 2021-05-16 LAB — PTH, INTACT AND CALCIUM
Calcium: 9.4 mg/dL (ref 8.6–10.4)
PTH: 59 pg/mL (ref 16–77)

## 2021-05-16 NOTE — Assessment & Plan Note (Signed)
[place] for d/c premarin

## 2021-05-16 NOTE — Assessment & Plan Note (Signed)
Mild worsening, to continue zoloft, ability - also refer counseling

## 2021-05-16 NOTE — Assessment & Plan Note (Signed)
BP Readings from Last 3 Encounters:  05/13/21 110/68  10/25/20 130/74  09/27/20 140/80   Stable, pt to continue medical treatment norvasc, hct, labetolol, micardis   Current Outpatient Medications (Cardiovascular):  .  amLODipine (NORVASC) 5 MG tablet, TAKE ONE TABLET DAILY .  hydrochlorothiazide (HYDRODIURIL) 25 MG tablet, TAKE ONE TABLET DAILY .  labetalol (NORMODYNE) 300 MG tablet, TAKE ONE TABLET TWICE DAILY .  rosuvastatin (CRESTOR) 20 MG tablet, TAKE ONE TABLET DAILY .  telmisartan (MICARDIS) 80 MG tablet, TAKE ONE TABLET DAILY   Current Outpatient Medications (Analgesics):  .  HYDROcodone-acetaminophen (NORCO/VICODIN) 5-325 MG tablet, Take 1 tablet by mouth every 6 (six) hours as needed for moderate pain.  Current Outpatient Medications (Hematological):  .  vitamin B-12 (CYANOCOBALAMIN) 1000 MCG tablet, Take 1 tablet (1,000 mcg total) by mouth daily.  Current Outpatient Medications (Other):  Marland Kitchen  ALPRAZolam (XANAX) 0.5 MG tablet, TAKE ONE TABLET TWICE DAILY AS NEEDED .  amphetamine-dextroamphetamine (ADDERALL) 10 MG tablet, TAKE ONE TABLET TWICE DAILY .  ARIPiprazole (ABILIFY) 5 MG tablet, TAKE ONE TABLET DAILY .  Cholecalciferol (THERA-D 2000) 50 MCG (2000 UT) TABS, 1 tab by mouth once daily .  donepezil (ARICEPT) 5 MG tablet, Take 1 tablet (5 mg total) by mouth at bedtime. .  Hypromellose (ARTIFICIAL TEARS OP), Place 1 drop into both eyes daily as needed (dry eyes). .  mupirocin ointment (BACTROBAN) 2 %, On leg wound w/dressing change qd or bid .  potassium chloride SA (K-DUR,KLOR-CON) 20 MEQ tablet, TAKE ONE TABLET TWICE DAILY .  sertraline (ZOLOFT) 100 MG tablet, TAKE ONE TABLET TWICE DAILY .  cephALEXin (KEFLEX) 500 MG capsule, Take 1 capsule (500 mg total) by mouth 3 (three) times daily for 10 days.

## 2021-05-16 NOTE — Assessment & Plan Note (Signed)
Difficult to assess given depression anxiety, continue to follow

## 2021-05-16 NOTE — Assessment & Plan Note (Signed)
Lab Results  Component Value Date   LDLCALC 70 05/13/2021   Mild uncontrolled, goal ldl < 70, pt to continue current statin crestor as declines increase

## 2021-05-16 NOTE — Assessment & Plan Note (Signed)
Lab Results  Component Value Date   TSH 2.62 05/13/2021   Stable, pt to continue levothyroxine

## 2021-05-16 NOTE — Assessment & Plan Note (Signed)
With mild recent worsening per pt - for f/u Dr Gloris Manchester per pt

## 2021-05-25 DIAGNOSIS — M5416 Radiculopathy, lumbar region: Secondary | ICD-10-CM | POA: Diagnosis not present

## 2021-05-27 DIAGNOSIS — M5416 Radiculopathy, lumbar region: Secondary | ICD-10-CM | POA: Insufficient documentation

## 2021-05-27 HISTORY — DX: Radiculopathy, lumbar region: M54.16

## 2021-06-09 ENCOUNTER — Other Ambulatory Visit: Payer: Self-pay | Admitting: Internal Medicine

## 2021-06-09 NOTE — Telephone Encounter (Signed)
Please refill as per office routine med refill policy (all routine meds to be refilled for 3 mo or monthly (per pt preference) up to one year from last visit, then month to month grace period for 3 mo, then further med refills will have to be denied) ? ?

## 2021-06-16 ENCOUNTER — Other Ambulatory Visit: Payer: Self-pay | Admitting: Internal Medicine

## 2021-06-20 DIAGNOSIS — M5416 Radiculopathy, lumbar region: Secondary | ICD-10-CM | POA: Diagnosis not present

## 2021-07-06 ENCOUNTER — Other Ambulatory Visit: Payer: Self-pay | Admitting: Internal Medicine

## 2021-07-06 NOTE — Telephone Encounter (Signed)
Please refill as per office routine med refill policy (all routine meds to be refilled for 3 mo or monthly (per pt preference) up to one year from last visit, then month to month grace period for 3 mo, then further med refills will have to be denied) ? ?

## 2021-07-25 ENCOUNTER — Other Ambulatory Visit: Payer: Self-pay | Admitting: Internal Medicine

## 2021-10-04 ENCOUNTER — Other Ambulatory Visit: Payer: Self-pay | Admitting: Internal Medicine

## 2021-10-26 ENCOUNTER — Telehealth: Payer: Self-pay

## 2021-10-26 ENCOUNTER — Ambulatory Visit (INDEPENDENT_AMBULATORY_CARE_PROVIDER_SITE_OTHER): Payer: PPO

## 2021-10-26 ENCOUNTER — Other Ambulatory Visit: Payer: Self-pay

## 2021-10-26 VITALS — BP 110/70 | HR 63 | Temp 98.1°F | Ht 63.0 in | Wt 145.6 lb

## 2021-10-26 DIAGNOSIS — Z0000 Encounter for general adult medical examination without abnormal findings: Secondary | ICD-10-CM | POA: Diagnosis not present

## 2021-10-26 NOTE — Progress Notes (Signed)
Subjective:   Alyssa Fernandez is a 81 y.o. female who presents for Medicare Annual (Subsequent) preventive examination.  Review of Systems     Cardiac Risk Factors include: advanced age (>47mn, >>30women);dyslipidemia;hypertension;family history of premature cardiovascular disease     Objective:    Today's Vitals   10/26/21 0917  BP: 110/70  Pulse: 63  Temp: 98.1 F (36.7 C)  SpO2: 92%  Weight: 145 lb 9.6 oz (66 kg)  Height: '5\' 3"'$  (1.6 m)  PainSc: 0-No pain   Body mass index is 25.79 kg/m.  Advanced Directives 10/26/2021 10/25/2020 03/29/2018 04/13/2017 05/06/2012  Does Patient Have a Medical Advance Directive? Yes Yes Yes No Patient has advance directive, copy not in chart  Type of Advance Directive Living will;Healthcare Power of Attorney Living will;Healthcare Power of [place]  Does patient want to make changes to medical advance directive? No - Patient declined No - Patient declined - - -  Copy of [place]? No - copy requested No - copy requested No - copy requested - Copy requested from family  Would patient like information on creating a medical advance directive? - - - No - Patient declined -    Current Medications (verified) Outpatient Encounter Medications as of 10/26/2021  Medication Sig   ALPRAZolam (XANAX) 0.5 MG tablet TAKE ONE TABLET TWICE DAILY AS NEEDED   amLODipine (NORVASC) 5 MG tablet TAKE ONE TABLET DAILY   amphetamine-dextroamphetamine (ADDERALL) 10 MG tablet TAKE ONE TABLET TWICE DAILY   ARIPiprazole (ABILIFY) 5 MG tablet TAKE ONE TABLET DAILY   Cholecalciferol (VITAMIN D3) 50 MCG (2000 UT) capsule TAKE ONE CAPSULE EACH DAY   donepezil (ARICEPT) 5 MG tablet TAKE ONE TABLET AT BEDTIME   hydrochlorothiazide (HYDRODIURIL) 25 MG tablet TAKE ONE TABLET DAILY   HYDROcodone-acetaminophen (NORCO/VICODIN) 5-325 MG tablet Take 1 tablet by mouth every 6  (six) hours as needed for moderate pain.   Hypromellose (ARTIFICIAL TEARS OP) Place 1 drop into both eyes daily as needed (dry eyes).   labetalol (NORMODYNE) 300 MG tablet TAKE ONE TABLET TWICE DAILY   mupirocin ointment (BACTROBAN) 2 % On leg wound w/dressing change qd or bid   potassium chloride SA (K-DUR,KLOR-CON) 20 MEQ tablet TAKE ONE TABLET TWICE DAILY   rosuvastatin (CRESTOR) 20 MG tablet TAKE ONE TABLET DAILY   sertraline (ZOLOFT) 100 MG tablet TAKE ONE TABLET TWICE DAILY   telmisartan (MICARDIS) 80 MG tablet TAKE ONE TABLET DAILY   vitamin B-12 (CYANOCOBALAMIN) 1000 MCG tablet Take 1 tablet (1,000 mcg total) by mouth daily.   No facility-administered encounter medications on file as of 10/26/2021.    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     bladder operation, but per sling procedure     BUNIONECTOMY     CERVICAL DISC SURGERY  fx rt ankle     left temporal bx artery-negative 2      PARTIAL NEPHRECTOMY     percutaneous drainage of liver abscess  05/06/2012   Family History  Problem Relation Age of Onset   Stroke Mother    Social History   Socioeconomic History   Marital status: Widowed    Spouse name: Not on file   Number of children: 1   Years of education: Not on file   Highest education level: Not on file  Occupational History   Occupation: Retired  Tobacco Use   Smoking status: Never   Smokeless tobacco: Never  Vaping Use   Vaping Use:  Never used  Substance and Sexual Activity   Alcohol use: No   Drug use: No   Sexual activity: Not Currently    Birth control/protection: Post-menopausal  Other Topics Concern   Not on file  Social History Narrative   Widowed since December 2021; married for over 5 years.   Lives in a 2-story home with cat.   Social Determinants of Health   Financial Resource Strain: Low Risk    Difficulty of Paying Living Expenses: Not hard at all  Food Insecurity: No Food Insecurity   Worried About Charity fundraiser in the Last Year: Never true   [place] in the Last Year: Never true  Transportation Needs: No Transportation Needs   Lack of Transportation (Medical): No   Lack of Transportation (Non-Medical): No  Physical Activity: Sufficiently Active   Days of Exercise per Week: 5 days   Minutes of Exercise per Session: 30 min  Stress: No Stress Concern Present   Feeling of Stress : Not at all  Social Connections: Moderately Integrated   Frequency of Communication with Friends and Family: More than three times a week   Frequency of Social Gatherings with Friends and Family: Once a week   Attends Religious Services: More than 4 times per year   Active Member of [employer] or Organizations: No   Attends Music therapist: More than 4 times per year   Marital Status: Widowed    Tobacco Counseling Counseling given: Not Answered   Clinical Intake:  Pre-visit preparation completed: Yes  Pain : No/denies pain Pain Score: 0-No pain     BMI - recorded: 25.79 Nutritional Status: BMI 25 -29 Overweight Nutritional Risks: None Diabetes: No  How often do you need to have someone help you when you read instructions, pamphlets, or other written materials from your doctor or pharmacy?: 1 - Never What is the last grade level you completed in school?: HSG; 2 years of college  Diabetic? no  Interpreter Needed?: No  Information entered by :: Lisette Abu,  LPN   Activities of Daily Living In your present state of health, do you have any difficulty performing the following activities: 10/26/2021  Hearing? N  Vision? N  Difficulty concentrating or making decisions? Y  Walking or climbing stairs? N  Dressing or bathing? N  Doing errands, shopping? N  Preparing Food and eating ? N  Using the Toilet? N  In the past six months, have you accidently leaked urine? Y  Do you have problems with loss of bowel control? N  Managing your Medications? N  Managing your Finances? N  Housekeeping or managing your Housekeeping? N  Some recent data might be hidden    Patient Care Team: Biagio Borg, MD as PCP - General (Internal Medicine)  Indicate any recent Medical  Services you may have received from other than [facility] providers in the past year (date may be approximate).     Assessment:   This is a routine wellness examination for Alyssa Fernandez.  Hearing/Vision screen Hearing Screening - Comments:: Patient denied any hearing difficulty. No hearing aids. Vision Screening - Comments:: Patient does not wear any glasses/contacts. Cataracts removed and had lens implants.  Dietary issues and exercise activities discussed: Current Exercise Habits: Home exercise routine, Type of exercise: walking, Time (Minutes): 30, Frequency (Times/Week): 5, Weekly Exercise (Minutes/Week): 150, Intensity: Mild, Exercise limited by: None identified   Goals Addressed               This Visit's Progress     Patient Stated (pt-stated)        My goal is to stay well.      Depression Screen PHQ 2/9 Scores 10/26/2021 10/25/2020 05/26/2020 05/08/2019 03/29/2018 01/30/2018 08/01/2017  PHQ - 2 Score 0 2 0 '1 1 1 '$ 0  PHQ- 9 Score - - - - 4 - -    Fall Risk Fall Risk  10/26/2021 10/25/2020 12/17/2019 05/08/2019 03/29/2018  Falls in the past year? 0 0 1 0 No  Number falls in past yr: 0 0 0 - -  Injury with Fall? 0 0 1 - -  Risk for fall due to : No Fall Risks No Fall Risks History of  fall(s) - -  Follow up Falls evaluation completed Falls evaluation completed Falls evaluation completed - -    FALL RISK PREVENTION PERTAINING TO THE HOME:  Any stairs in or around the home? Yes  If so, are there any without handrails? No  Home free of loose throw rugs in walkways, pet beds, electrical cords, etc? Yes  Adequate lighting in your home to reduce risk of falls? Yes   ASSISTIVE DEVICES UTILIZED TO PREVENT FALLS:  Life alert? No  Use of a cane, walker or w/c? No  Grab bars in the bathroom? Yes  Shower chair or bench in shower? Yes  Elevated toilet seat or a handicapped toilet? Yes   TIMED UP AND GO:  Was the test performed? Yes .  Length of time to ambulate 10 feet: 6 sec.   Gait steady and fast without use of assistive device  Cognitive Function:  Yes; Patient has current diagnosis of cognitive impairment.  MMSE - Mini Mental State Exam 03/29/2018  Orientation to time 5  Orientation to Place 5  Registration 3  Attention/ Calculation 4  Recall 1  Language- name 2 objects 2  Language- repeat 1  Language- follow 3 step command 3  Language- read & follow direction 1  Write a sentence 1  Copy design 1  Total score 27        Immunizations Immunization History  Administered Date(s) Administered   Fluad Quad(high Dose 65+) 04/19/2019, 09/27/2020, 05/13/2021   Influenza Split 06/02/2011, 05/21/2012   Influenza Whole 06/12/2007   Influenza, High Dose Seasonal PF 04/20/2017   Influenza,inj,Quad PF,6+ Mos 05/22/2013, 04/08/2014, 06/03/2015, 05/04/2016   Influenza,inj,quad, With Preservative 05/21/2017   PFIZER Comirnaty(Gray Top)Covid-19 Tri-Sucrose Vaccine 01/03/2020, 01/24/2020, 09/16/2020   Pneumococcal Conjugate-13 05/22/2013   Pneumococcal Polysaccharide-23 06/12/2007   Td 10/16/2006   Tdap 04/20/2017   Zoster Recombinat (Shingrix) 02/15/2018, 04/10/2018   Zoster, Live 10/16/2006    TDAP status: Up to date  Flu Vaccine status: Up to  date  Pneumococcal vaccine status: Up to date  Covid-19 vaccine status: Completed vaccines  Qualifies for Shingles Vaccine? Yes  Zostavax completed Yes   Shingrix Completed?: Yes  Screening Tests Health Maintenance  Topic Date Due   COVID-19 Vaccine (4 - Booster for Pfizer series) 11/11/2020   TETANUS/TDAP  04/21/2027   Pneumonia Vaccine 12+ Years old  Completed   INFLUENZA VACCINE  Completed   DEXA SCAN  Completed   Zoster Vaccines- Shingrix  Completed   HPV VACCINES  Aged Out    Health Maintenance  Health Maintenance Due  Topic Date Due   COVID-19 Vaccine (4 - Booster for Pfizer series) 11/11/2020    Colorectal cancer screening: No longer required.   Mammogram status: No longer required due to age.  Bone Density status: Completed 06/10/2019. Results reflect: Bone density results: OSTEOPENIA. Repeat every 2-3 years.  Lung Cancer Screening: (Low Dose CT Chest recommended if Age 77-80 years, 30 pack-year currently smoking OR have quit w/in 15years.) does not qualify.   Lung Cancer Screening Referral: no  Additional Screening:  Hepatitis C Screening: does not qualify; Completed no  Vision Screening: Recommended annual ophthalmology exams for early detection of glaucoma and other disorders of the eye. Is the patient up to date with their annual eye exam?  Yes  Who is the provider or what is the name of the office in which the patient attends annual eye exams? [facility] If pt is not established with a provider, would they like to be referred to a provider to establish care? No .   Dental Screening: Recommended annual dental exams for proper oral hygiene  Community Resource Referral / Chronic Care Management: CRR required this visit?  No   CCM required this visit?  No      Plan:     I have personally reviewed and noted the following in the patients chart:   Medical and social history Use of alcohol, tobacco or illicit drugs  Current  medications and supplements including opioid prescriptions.  Functional ability and status Nutritional status Physical activity Advanced directives List of other physicians Hospitalizations, surgeries, and ER visits in previous 12 months Vitals Screenings to include cognitive, depression, and falls Referrals and appointments  In addition, I have reviewed and discussed with patient certain preventive protocols, quality metrics, and best practice recommendations. A written personalized care plan for preventive services as well as general preventive health recommendations were provided to patient.     Sheral Flow, LPN   04/28/9479   Nurse Notes:  Hearing Screening - Comments:: Patient denied any hearing difficulty. No hearing aids. Vision Screening - Comments:: Patient does not wear any glasses/contacts. Cataracts removed and had lens implants.

## 2021-10-26 NOTE — Telephone Encounter (Signed)
Patient would like to get a refill on her Premarin; she stated that the pharmacy will not refill due to age inappropriate.  Does she need to come in for this rx? ?

## 2021-10-26 NOTE — Telephone Encounter (Signed)
Patient notified and verbalizes understanding.

## 2021-10-26 NOTE — Patient Instructions (Signed)
Alyssa Fernandez , Thank you for taking time to come for your Medicare Wellness Visit. I appreciate your ongoing commitment to your health goals. Please review the following plan we discussed and let me know if I can assist you in the future.   Screening recommendations/referrals: Colonoscopy: discontinued Mammogram: discontinued Bone Density: 06/10/2019; due every 2-3 years Recommended yearly ophthalmology/optometry visit for glaucoma screening and checkup Recommended yearly dental visit for hygiene and checkup  Vaccinations: Influenza vaccine: 05/13/2021 Pneumococcal vaccine: 06/12/2007, 05/22/2013 Tdap vaccine: 04/20/2017; due every 10 years Shingles vaccine: 02/15/2018, 04/10/2018   Covid-19: 01/03/2020, 01/24/2020, 09/16/2020  Advanced directives: Please bring a copy of your health care power of attorney and living will to the office at your convenience.  Conditions/risks identified: Yes; Client understands the importance of follow-up appointments with providers by attending scheduled visits and discussed goals to eat healthier, increase physical activity 5 times a week for 30 minutes each, exercise the brain by doing stimulating brain exercises (reading, adult coloring, crafting, listening to music, puzzles, etc.), socialize and enjoy life more, get enough sleep at least 8-9 hours average per night and make time for laughter.  Next appointment: Please schedule your next Medicare Wellness Visit with your Nurse Health Advisor in 1 year by calling [phone].   Preventive Care 38 Years and Older, Female Preventive care refers to lifestyle choices and visits with your health care provider that can promote health and wellness. What does preventive care include? A yearly physical exam. This is also called an annual well check. Dental exams once or twice a year. Routine eye exams. Ask your health care provider how often you should have your eyes checked. Personal lifestyle choices, including: Daily  care of your teeth and gums. Regular physical activity. Eating a healthy diet. Avoiding tobacco and drug use. Limiting alcohol use. Practicing safe sex. Taking low-dose aspirin every day. Taking vitamin and mineral supplements as recommended by your health care provider. What happens during an annual well check? The services and screenings done by your health care provider during your annual well check will depend on your age, overall health, lifestyle risk factors, and family history of disease. Counseling  Your health care provider may ask you questions about your: Alcohol use. Tobacco use. Drug use. Emotional well-being. Home and relationship well-being. Sexual activity. Eating habits. History of falls. Memory and ability to understand (cognition). Work and work Statistician. Reproductive health. Screening  You may have the following tests or measurements: Height, weight, and BMI. Blood pressure. Lipid and cholesterol levels. These may be checked every 5 years, or more frequently if you are over 35 years old. Skin check. Lung cancer screening. You may have this screening every year starting at age 3 if you have a 30-pack-year history of smoking and currently smoke or have quit within the past 15 years. Fecal occult blood test (FOBT) of the stool. You may have this test every year starting at age 36. Flexible sigmoidoscopy or colonoscopy. You may have a sigmoidoscopy every 5 years or a colonoscopy every 10 years starting at age 80. Hepatitis C blood test. Hepatitis B blood test. Sexually transmitted disease (STD) testing. Diabetes screening. This is done by checking your blood sugar (glucose) after you have not eaten for a while (fasting). You may have this done every 1-3 years. Bone density scan. This is done to screen for osteoporosis. You may have this done starting at age 16. Mammogram. This may be done every 1-2 years. Talk to your health care provider about how  often you  should have regular mammograms. Talk with your health care provider about your test results, treatment options, and if necessary, the need for more tests. Vaccines  Your health care provider may recommend certain vaccines, such as: Influenza vaccine. This is recommended every year. Tetanus, diphtheria, and acellular pertussis (Tdap, Td) vaccine. You may need a Td booster every 10 years. Zoster vaccine. You may need this after age 1. Pneumococcal 13-valent conjugate (PCV13) vaccine. One dose is recommended after age 18. Pneumococcal polysaccharide (PPSV23) vaccine. One dose is recommended after age 50. Talk to your health care provider about which screenings and vaccines you need and how often you need them. This information is not intended to replace advice given to you by your health care provider. Make sure you discuss any questions you have with your health care provider. Document Released: 09/03/2015 Document Revised: 04/26/2016 Document Reviewed: 06/08/2015 Elsevier Interactive Patient Education  2017 [employer] Prevention in the Home Falls can cause injuries. They can happen to people of all ages. There are many things you can do to make your home safe and to help prevent falls. What can I do on the outside of my home? Regularly fix the edges of walkways and driveways and fix any cracks. Remove anything that might make you trip as you walk through a door, such as a raised step or threshold. Trim any bushes or trees on the path to your home. Use bright outdoor lighting. Clear any walking paths of anything that might make someone trip, such as rocks or tools. Regularly check to see if handrails are loose or broken. Make sure that both sides of any steps have handrails. Any raised decks and porches should have guardrails on the edges. Have any leaves, snow, or ice cleared regularly. Use sand or salt on walking paths during winter. Clean up any spills in your garage right away.  This includes oil or grease spills. What can I do in the bathroom? Use night lights. Install grab bars by the toilet and in the tub and shower. Do not use towel bars as grab bars. Use non-skid mats or decals in the tub or shower. If you need to sit down in the shower, use a plastic, non-slip stool. Keep the floor dry. Clean up any water that spills on the floor as soon as it happens. Remove soap buildup in the tub or shower regularly. Attach bath mats securely with double-sided non-slip rug tape. Do not have throw rugs and other things on the floor that can make you trip. What can I do in the bedroom? Use night lights. Make sure that you have a light by your bed that is easy to reach. Do not use any sheets or blankets that are too big for your bed. They should not hang down onto the floor. Have a firm chair that has side arms. You can use this for support while you get dressed. Do not have throw rugs and other things on the floor that can make you trip. What can I do in the kitchen? Clean up any spills right away. Avoid walking on wet floors. Keep items that you use a lot in easy-to-reach places. If you need to reach something above you, use a strong step stool that has a grab bar. Keep electrical cords out of the way. Do not use floor polish or wax that makes floors slippery. If you must use wax, use non-skid floor wax. Do not have throw rugs and other things  on the floor that can make you trip. What can I do with my stairs? Do not leave any items on the stairs. Make sure that there are handrails on both sides of the stairs and use them. Fix handrails that are broken or loose. Make sure that handrails are as long as the stairways. Check any carpeting to make sure that it is firmly attached to the stairs. Fix any carpet that is loose or worn. Avoid having throw rugs at the top or bottom of the stairs. If you do have throw rugs, attach them to the floor with carpet tape. Make sure that  you have a light switch at the top of the stairs and the bottom of the stairs. If you do not have them, ask someone to add them for you. What else can I do to help prevent falls? Wear shoes that: Do not have high heels. Have rubber bottoms. Are comfortable and fit you well. Are closed at the toe. Do not wear sandals. If you use a stepladder: Make sure that it is fully opened. Do not climb a closed stepladder. Make sure that both sides of the stepladder are locked into place. Ask someone to hold it for you, if possible. Clearly mark and make sure that you can see: Any grab bars or handrails. First and last steps. Where the edge of each step is. Use tools that help you move around (mobility aids) if they are needed. These include: Canes. Walkers. Scooters. Crutches. Turn on the lights when you go into a dark area. Replace any light bulbs as soon as they burn out. Set up your furniture so you have a clear path. Avoid moving your furniture around. If any of your floors are uneven, fix them. If there are any pets around you, be aware of where they are. Review your medicines with your doctor. Some medicines can make you feel dizzy. This can increase your chance of falling. Ask your doctor what other things that you can do to help prevent falls. This information is not intended to replace advice given to you by your health care provider. Make sure you discuss any questions you have with your health care provider. Document Released: 06/03/2009 Document Revised: 01/13/2016 Document Reviewed: 09/11/2014 Elsevier Interactive Patient Education  2017 [employer].

## 2021-10-26 NOTE — Telephone Encounter (Signed)
I agree that we should try to hold on taking this further, as long term use might be associated with increased risk of breast cancer, so ok to stop at this time ?

## 2021-11-07 ENCOUNTER — Other Ambulatory Visit: Payer: Self-pay | Admitting: Internal Medicine

## 2021-11-11 ENCOUNTER — Other Ambulatory Visit: Payer: Self-pay | Admitting: Internal Medicine

## 2021-11-11 NOTE — Telephone Encounter (Signed)
Please refill as per office routine med refill policy (all routine meds to be refilled for 3 mo or monthly (per pt preference) up to one year from last visit, then month to month grace period for 3 mo, then further med refills will have to be denied) ? ?

## 2021-12-06 ENCOUNTER — Other Ambulatory Visit: Payer: Self-pay | Admitting: Internal Medicine

## 2021-12-06 NOTE — Telephone Encounter (Signed)
Please refill as per office routine med refill policy (all routine meds to be refilled for 3 mo or monthly (per pt preference) up to one year from last visit, then month to month grace period for 3 mo, then further med refills will have to be denied) ? ?

## 2021-12-07 ENCOUNTER — Other Ambulatory Visit: Payer: Self-pay | Admitting: Internal Medicine

## 2021-12-12 ENCOUNTER — Ambulatory Visit (INDEPENDENT_AMBULATORY_CARE_PROVIDER_SITE_OTHER): Payer: PPO | Admitting: Internal Medicine

## 2021-12-12 VITALS — BP 132/74 | HR 58 | Temp 98.9°F | Ht 63.0 in | Wt 141.0 lb

## 2021-12-12 DIAGNOSIS — E559 Vitamin D deficiency, unspecified: Secondary | ICD-10-CM | POA: Diagnosis not present

## 2021-12-12 DIAGNOSIS — N1831 Chronic kidney disease, stage 3a: Secondary | ICD-10-CM | POA: Diagnosis not present

## 2021-12-12 DIAGNOSIS — F32A Depression, unspecified: Secondary | ICD-10-CM

## 2021-12-12 DIAGNOSIS — R1013 Epigastric pain: Secondary | ICD-10-CM | POA: Diagnosis not present

## 2021-12-12 DIAGNOSIS — F03B3 Unspecified dementia, moderate, with mood disturbance: Secondary | ICD-10-CM | POA: Diagnosis not present

## 2021-12-12 DIAGNOSIS — F419 Anxiety disorder, unspecified: Secondary | ICD-10-CM

## 2021-12-12 DIAGNOSIS — R609 Edema, unspecified: Secondary | ICD-10-CM

## 2021-12-12 DIAGNOSIS — E538 Deficiency of other specified B group vitamins: Secondary | ICD-10-CM

## 2021-12-12 DIAGNOSIS — R739 Hyperglycemia, unspecified: Secondary | ICD-10-CM | POA: Diagnosis not present

## 2021-12-12 DIAGNOSIS — F4321 Adjustment disorder with depressed mood: Secondary | ICD-10-CM

## 2021-12-12 DIAGNOSIS — I10 Essential (primary) hypertension: Secondary | ICD-10-CM | POA: Diagnosis not present

## 2021-12-12 LAB — CBC WITH DIFFERENTIAL/PLATELET
Basophils Absolute: 0 10*3/uL (ref 0.0–0.1)
Basophils Relative: 0.7 % (ref 0.0–3.0)
Eosinophils Absolute: 0.5 10*3/uL (ref 0.0–0.7)
Eosinophils Relative: 7.1 % — ABNORMAL HIGH (ref 0.0–5.0)
HCT: 37.2 % (ref 36.0–46.0)
Hemoglobin: 12.7 g/dL (ref 12.0–15.0)
Lymphocytes Relative: 16 % (ref 12.0–46.0)
Lymphs Abs: 1.1 10*3/uL (ref 0.7–4.0)
MCHC: 34.2 g/dL (ref 30.0–36.0)
MCV: 87.5 fl (ref 78.0–100.0)
Monocytes Absolute: 0.6 10*3/uL (ref 0.1–1.0)
Monocytes Relative: 8.4 % (ref 3.0–12.0)
Neutro Abs: 4.8 10*3/uL (ref 1.4–7.7)
Neutrophils Relative %: 67.8 % (ref 43.0–77.0)
Platelets: 225 10*3/uL (ref 150.0–400.0)
RBC: 4.25 Mil/uL (ref 3.87–5.11)
RDW: 13.8 % (ref 11.5–15.5)
WBC: 7.1 10*3/uL (ref 4.0–10.5)

## 2021-12-12 LAB — TSH: TSH: 2.74 u[IU]/mL (ref 0.35–5.50)

## 2021-12-12 LAB — HEMOGLOBIN A1C: Hgb A1c MFr Bld: 5.9 % (ref 4.6–6.5)

## 2021-12-12 LAB — VITAMIN D 25 HYDROXY (VIT D DEFICIENCY, FRACTURES): VITD: 42.51 ng/mL (ref 30.00–100.00)

## 2021-12-12 LAB — VITAMIN B12: Vitamin B-12: 1273 pg/mL — ABNORMAL HIGH (ref 211–911)

## 2021-12-12 MED ORDER — ALPRAZOLAM 0.5 MG PO TABS: ORAL_TABLET | ORAL | 5 refills | Status: DC

## 2021-12-12 MED ORDER — DONEPEZIL HCL 10 MG PO TABS: 10.0000 mg | ORAL_TABLET | Freq: Every day | ORAL | 3 refills | Status: DC

## 2021-12-12 MED ORDER — SUCRALFATE 1 G PO TABS: 1.0000 g | ORAL_TABLET | Freq: Four times a day (QID) | ORAL | 0 refills | Status: DC

## 2021-12-12 MED ORDER — ARIPIPRAZOLE 5 MG PO TABS: 5.0000 mg | ORAL_TABLET | Freq: Every day | ORAL | 3 refills | Status: DC

## 2021-12-12 MED ORDER — TELMISARTAN 80 MG PO TABS: 80.0000 mg | ORAL_TABLET | Freq: Every day | ORAL | 3 refills | Status: DC

## 2021-12-12 MED ORDER — SERTRALINE HCL 100 MG PO TABS: 100.0000 mg | ORAL_TABLET | Freq: Two times a day (BID) | ORAL | 3 refills | Status: DC

## 2021-12-12 MED ORDER — ROSUVASTATIN CALCIUM 20 MG PO TABS: 20.0000 mg | ORAL_TABLET | Freq: Every day | ORAL | 3 refills | Status: DC

## 2021-12-12 MED ORDER — LABETALOL HCL 300 MG PO TABS: 300.0000 mg | ORAL_TABLET | Freq: Two times a day (BID) | ORAL | 3 refills | Status: DC

## 2021-12-12 MED ORDER — PANTOPRAZOLE SODIUM 40 MG PO TBEC: 40.0000 mg | DELAYED_RELEASE_TABLET | Freq: Every day | ORAL | 3 refills | Status: DC

## 2021-12-12 MED ORDER — AMLODIPINE BESYLATE 5 MG PO TABS: 5.0000 mg | ORAL_TABLET | Freq: Every day | ORAL | 3 refills | Status: DC

## 2021-12-12 NOTE — Progress Notes (Signed)
Patient ID: Alyssa Fernandez, female   DOB: [birth year], 81 y.o.   MRN: [id number] ? ? ? ?    Chief Complaint: follow up not feeling well ? ?     HPI:  Alyssa Fernandez is a 81 y.o. female here with c/o depressoin and fatigue, husband died 1.5 yrs ago with covid infection.  Has mild to mod worsening depressive symptoms, but no suicidal ideation, or panic; has ongoing anxiety.  Tolerating aricept 5 mg well  - Denies worsening reflux, dysphagia, bowel change or blood.  Also c/o 2-3 wks new onset epigastric dull pain, without raidation, mild nausea, no vomiting,  Pt denies fever, wt loss, night sweats, loss of appetite, or other constitutional symptoms  Pt denies chest pain, increased sob or doe, wheezing, orthopnea, PND, increased LE swelling, palpitations, dizziness or syncope.   Pt denies polydipsia, polyuria, or new focal neuro s/s.   ?      ?Wt Readings from Last 3 Encounters:  ?12/12/21 141 lb (64 kg)  ?10/26/21 145 lb 9.6 oz (66 kg)  ?05/13/21 147 lb (66.7 kg)  ? ?BP Readings from Last 3 Encounters:  ?12/12/21 132/74  ?10/26/21 110/70  ?05/13/21 110/68  ? ?      ?Past Medical History:  ?Diagnosis Date  ? Anxiety   ? Atherosclerosis of aorta ([place]) 08/01/2017  ? Cervical spine degeneration   ? Severe by CT April 2012  ? GERD   ? Hyperlipidemia   ? Hypertension   ? HYPOTHYROIDISM   ? INSOMNIA   ? 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 OSTEOPENIA   ? Polycystic kidney disease   ? RENAL FAILURE   ? VITAMIN D DEFICIENCY   ? ?Past Surgical History:  ?Procedure Laterality Date  ? ABDOMINAL HYSTERECTOMY    ? 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 bladder operation, but per sling procedure    ? BUNIONECTOMY    ? CERVICAL DISC SURGERY    ? fx rt ankle    ? left temporal bx artery-negative 2     ? PARTIAL NEPHRECTOMY    ? percutaneous drainage of liver abscess  05/06/2012  ? ? reports that she has never smoked. She has never used smokeless tobacco. She reports that she does not drink alcohol and does not use drugs. ?family  history includes Stroke in her mother. ?Allergies  ?Allergen Reactions  ? Codeine Swelling  ? Sulfonamide Derivatives Swelling  ? Zocor [Simvastatin] Other (See Comments)  ?  Cognitive decrease  ? ?Current Outpatient Medications on File Prior to Visit  ?Medication Sig Dispense Refill  ? Cholecalciferol (VITAMIN D3) 50 MCG (2000 UT) capsule TAKE ONE CAPSULE EACH DAY 90 capsule 99  ? vitamin B-12 (CYANOCOBALAMIN) 1000 MCG tablet Take 1 tablet (1,000 mcg total) by mouth daily. 90 tablet 3  ? ?No current facility-administered medications on file prior to visit.  ? ?     ROS:  All others reviewed and negative. ? ?Objective  ? ?     PE:  BP 132/74 (BP Location: Right Arm, Patient Position: Sitting, Cuff Size: Large)   Pulse (!) 58   Temp 98.9 ?F (37.2 ?C) (Oral)   Ht '5\' 3"'$  (1.6 m)   Wt 141 lb (64 kg)   SpO2 92%   BMI 24.98 kg/m?  ? ?              Constitutional: Pt appears in NAD ?  HENT: Head: NCAT.  ?              Right Ear: External ear normal.   ?              Left Ear: External ear normal.  ?              Eyes: . Pupils are equal, round, and reactive to light. Conjunctivae and EOM are normal ?              Nose: without d/c or deformity ?              Neck: Neck supple. Gross normal ROM ?              Cardiovascular: Normal rate and regular rhythm.   ?              Pulmonary/Chest: Effort normal and breath sounds without rales or wheezing.  ?              Abd:  Soft, mild epigastric tender, ND, + BS, no organomegaly ?              Neurological: Pt is alert. At baseline orientation, motor grossly intact ?              Skin: Skin is warm. No rashes, no other new lesions, LE edema - none ?              Psychiatric: Pt behavior is normal without agitation, depressed affect  ? ?Micro: none ? ?Cardiac tracings I have personally interpreted today:  none ? ?Pertinent Radiological findings (summarize): none  ? ?Lab Results  ?Component Value Date  ? WBC 7.1 12/12/2021  ? HGB 12.7 12/12/2021  ? HCT 37.2  12/12/2021  ? PLT 225.0 12/12/2021  ? GLUCOSE 116 (H) 12/12/2021  ? CHOL 124 12/12/2021  ? TRIG 185.0 (H) 12/12/2021  ? HDL 41.20 12/12/2021  ? LDLCALC 46 12/12/2021  ? ALT 15 12/12/2021  ? AST 21 12/12/2021  ? NA 135 12/12/2021  ? K 3.1 (L) 12/12/2021  ? CL 95 (L) 12/12/2021  ? CREATININE 1.35 (H) 12/12/2021  ? BUN 24 (H) 12/12/2021  ? CO2 30 12/12/2021  ? TSH 2.74 12/12/2021  ? INR 1.14 04/14/2017  ? HGBA1C 5.9 12/12/2021  ? ?Assessment/Plan:  ?Alyssa Fernandez is a 81 y.o. 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 ? ?Vitamin D deficiency ?Last vitamin D ?Lab Results  ?Component Value Date  ? VD25OH 42.51 12/12/2021  ? ?Stable, cont oral replacement ? ?Grief ?With husband death recent, for counseling referral ? ?Essential hypertension ?BP Readings from Last 3 Encounters:  ?12/12/21 132/74  ?10/26/21 110/70  ?05/13/21 110/68  ? ?Stable, pt to continue medical treatment norvasc, normodyne, micardis ? ? ?CKD (chronic kidney disease) stage 3, GFR 30-59 ml/min (HCC) ?Lab Results  ?Component Value Date  ? CREATININE 1.35 (H) 12/12/2021  ? ?Stable overall, cont to avoid nephrotoxins ? ? ?Anxiety and depression ?In addition to grief - also for counseling referral ? ?Epigastric pain ?Etiology unclear, for h pylori ab and labs as ordered, trial protonix 40 qd with carafate qid for 1 mo, declines GI referral for now  ? ?Dementia ([place]) ?Also for increased aricept 10 qd ? ?Followup: No follow-ups on  file. ? ?Cathlean Cower, MD 12/14/2021 7:17 PM ?[facility] ?[place] ?Internal Medicine ?

## 2021-12-12 NOTE — Patient Instructions (Addendum)
Please take all new medication as prescribed - the protonix and the carafate for the stomach ? ?Ok to increase the aricept to 10 mg for memory ? ?Please continue all other medications as before, and refills have been done if requested. ? ?Please have the pharmacy call with any other refills you may need. ? ?Please continue your efforts at being more active, low cholesterol diet, and weight control. ? ?You are otherwise up to date with prevention measures today. ? ?Please keep your appointments with your specialists as you may have planned ? ?You will be contacted regarding the referral for: counseling ? ?Please go to the LAB at the blood drawing area for the tests to be done ? ?You will be contacted by phone if any changes need to be made immediately.  Otherwise, you will receive a letter about your results with an explanation, but please check with MyChart first. ? ?Please remember to sign up for MyChart if you have not done so, as this will be important to you in the future with finding out test results, communicating by private email, and scheduling acute appointments online when needed. ? ?Please make an Appointment to return in 3 months, or sooner if needed ?

## 2021-12-13 LAB — LIPID PANEL
Cholesterol: 124 mg/dL (ref 0–200)
HDL: 41.2 mg/dL (ref >39.00)
LDL Cholesterol: 46 mg/dL (ref 0–99)
NonHDL: 82.63
Total CHOL/HDL Ratio: 3
Triglycerides: 185 mg/dL — ABNORMAL HIGH (ref 0.0–149.0)
VLDL: 37 mg/dL (ref 0.0–40.0)

## 2021-12-13 LAB — URINALYSIS, ROUTINE W REFLEX MICROSCOPIC
Bilirubin Urine: NEGATIVE
Hgb urine dipstick: NEGATIVE
Ketones, ur: NEGATIVE
Nitrite: NEGATIVE
RBC / HPF: NONE SEEN (ref 0–?)
Specific Gravity, Urine: 1.015 (ref 1.000–1.030)
Total Protein, Urine: NEGATIVE
Urine Glucose: NEGATIVE
Urobilinogen, UA: 0.2 (ref 0.0–1.0)
pH: 6.5 (ref 5.0–8.0)

## 2021-12-13 LAB — BASIC METABOLIC PANEL
BUN: 24 mg/dL — ABNORMAL HIGH (ref 6–23)
CO2: 30 mEq/L (ref 19–32)
Calcium: 9.3 mg/dL (ref 8.4–10.5)
Chloride: 95 mEq/L — ABNORMAL LOW (ref 96–112)
Creatinine, Ser: 1.35 mg/dL — ABNORMAL HIGH (ref 0.40–1.20)
GFR: 36.56 mL/min — ABNORMAL LOW (ref >60.00)
Glucose, Bld: 116 mg/dL — ABNORMAL HIGH (ref 70–99)
Potassium: 3.1 mEq/L — ABNORMAL LOW (ref 3.5–5.1)
Sodium: 135 mEq/L (ref 135–145)

## 2021-12-13 LAB — H. PYLORI ANTIBODY, IGG: H Pylori IgG: NEGATIVE

## 2021-12-13 LAB — HEPATIC FUNCTION PANEL
ALT: 15 U/L (ref 0–35)
AST: 21 U/L (ref 0–37)
Albumin: 4.4 g/dL (ref 3.5–5.2)
Alkaline Phosphatase: 64 U/L (ref 39–117)
Bilirubin, Direct: 0.1 mg/dL (ref 0.0–0.3)
Total Bilirubin: 0.5 mg/dL (ref 0.2–1.2)
Total Protein: 6.8 g/dL (ref 6.0–8.3)

## 2021-12-13 LAB — LIPASE: Lipase: 23 U/L (ref 11.0–59.0)

## 2021-12-14 ENCOUNTER — Other Ambulatory Visit: Payer: Self-pay | Admitting: Internal Medicine

## 2021-12-14 DIAGNOSIS — F419 Anxiety disorder, unspecified: Secondary | ICD-10-CM | POA: Insufficient documentation

## 2021-12-14 DIAGNOSIS — F329 Major depressive disorder, single episode, unspecified: Secondary | ICD-10-CM

## 2021-12-14 DIAGNOSIS — R1013 Epigastric pain: Secondary | ICD-10-CM | POA: Insufficient documentation

## 2021-12-14 HISTORY — DX: Major depressive disorder, single episode, unspecified: F32.9

## 2021-12-14 HISTORY — DX: Epigastric pain: R10.13

## 2021-12-14 MED ORDER — POTASSIUM CHLORIDE ER 10 MEQ PO TBCR: 10.0000 meq | EXTENDED_RELEASE_TABLET | Freq: Every day | ORAL | 0 refills | Status: DC

## 2021-12-14 NOTE — Assessment & Plan Note (Signed)
Etiology unclear, for h pylori ab and labs as ordered, trial protonix 40 qd with carafate qid for 1 mo, declines GI referral for now  ?

## 2021-12-14 NOTE — Assessment & Plan Note (Signed)
With husband death recent, for counseling referral ?

## 2021-12-14 NOTE — Assessment & Plan Note (Signed)
Last vitamin D ?Lab Results  ?Component Value Date  ? VD25OH 42.51 12/12/2021  ? ?Stable, cont oral replacement ?

## 2021-12-14 NOTE — Assessment & Plan Note (Signed)
BP Readings from Last 3 Encounters:  ?12/12/21 132/74  ?10/26/21 110/70  ?05/13/21 110/68  ? ?Stable, pt to continue medical treatment norvasc, normodyne, micardis ? ?

## 2021-12-14 NOTE — Assessment & Plan Note (Signed)
Also for increased aricept 10 qd ?

## 2021-12-14 NOTE — Assessment & Plan Note (Signed)
Lab Results  ?Component Value Date  ? CREATININE 1.35 (H) 12/12/2021  ? ?Stable overall, cont to avoid nephrotoxins ? ?

## 2021-12-14 NOTE — Assessment & Plan Note (Signed)
In addition to grief - also for counseling referral ?

## 2021-12-24 ENCOUNTER — Other Ambulatory Visit: Payer: Self-pay | Admitting: Internal Medicine

## 2021-12-27 ENCOUNTER — Other Ambulatory Visit: Payer: Self-pay | Admitting: Internal Medicine

## 2022-01-09 ENCOUNTER — Telehealth: Payer: Self-pay | Admitting: Internal Medicine

## 2022-01-09 DIAGNOSIS — F03B3 Unspecified dementia, moderate, with mood disturbance: Secondary | ICD-10-CM

## 2022-01-09 NOTE — Telephone Encounter (Signed)
Pt daughter called in and states MD referred her to behavior health for deprerssion and anxiety.   MD diagnosed her with dementia. Wants to know why a referral to neurology was not placed?   Requesting a call back from assistant to discuss this issue.

## 2022-01-10 NOTE — Telephone Encounter (Signed)
This is not always done as treatment options are limited, but we can certainly do this if daughter is asking, thnaks

## 2022-03-09 DIAGNOSIS — L7212 Trichodermal cyst: Secondary | ICD-10-CM | POA: Diagnosis not present

## 2022-03-13 ENCOUNTER — Encounter: Payer: Self-pay | Admitting: Internal Medicine

## 2022-03-13 ENCOUNTER — Ambulatory Visit (INDEPENDENT_AMBULATORY_CARE_PROVIDER_SITE_OTHER): Payer: PPO | Admitting: Internal Medicine

## 2022-03-13 VITALS — BP 128/80 | HR 58 | Ht 63.0 in | Wt 135.0 lb

## 2022-03-13 DIAGNOSIS — E559 Vitamin D deficiency, unspecified: Secondary | ICD-10-CM

## 2022-03-13 DIAGNOSIS — Z0001 Encounter for general adult medical examination with abnormal findings: Secondary | ICD-10-CM | POA: Diagnosis not present

## 2022-03-13 DIAGNOSIS — N1831 Chronic kidney disease, stage 3a: Secondary | ICD-10-CM | POA: Diagnosis not present

## 2022-03-13 DIAGNOSIS — E7849 Other hyperlipidemia: Secondary | ICD-10-CM | POA: Diagnosis not present

## 2022-03-13 DIAGNOSIS — R739 Hyperglycemia, unspecified: Secondary | ICD-10-CM | POA: Diagnosis not present

## 2022-03-13 DIAGNOSIS — I10 Essential (primary) hypertension: Secondary | ICD-10-CM | POA: Diagnosis not present

## 2022-03-13 DIAGNOSIS — E538 Deficiency of other specified B group vitamins: Secondary | ICD-10-CM

## 2022-03-13 DIAGNOSIS — F03B3 Unspecified dementia, moderate, with mood disturbance: Secondary | ICD-10-CM

## 2022-03-13 DIAGNOSIS — R1013 Epigastric pain: Secondary | ICD-10-CM | POA: Diagnosis not present

## 2022-03-13 DIAGNOSIS — F32A Depression, unspecified: Secondary | ICD-10-CM | POA: Diagnosis not present

## 2022-03-13 DIAGNOSIS — F419 Anxiety disorder, unspecified: Secondary | ICD-10-CM | POA: Diagnosis not present

## 2022-03-13 LAB — BASIC METABOLIC PANEL
BUN: 14 mg/dL (ref 6–23)
CO2: 29 mEq/L (ref 19–32)
Calcium: 8.9 mg/dL (ref 8.4–10.5)
Chloride: 95 mEq/L — ABNORMAL LOW (ref 96–112)
Creatinine, Ser: 1.16 mg/dL (ref 0.40–1.20)
GFR: 43.78 mL/min — ABNORMAL LOW (ref >60.00)
Glucose, Bld: 109 mg/dL — ABNORMAL HIGH (ref 70–99)
Potassium: 3.4 mEq/L — ABNORMAL LOW (ref 3.5–5.1)
Sodium: 132 mEq/L — ABNORMAL LOW (ref 135–145)

## 2022-03-13 LAB — LIPID PANEL
Cholesterol: 117 mg/dL (ref 0–200)
HDL: 33.2 mg/dL — ABNORMAL LOW (ref >39.00)
LDL Cholesterol: 67 mg/dL (ref 0–99)
NonHDL: 83.9
Total CHOL/HDL Ratio: 4
Triglycerides: 85 mg/dL (ref 0.0–149.0)
VLDL: 17 mg/dL (ref 0.0–40.0)

## 2022-03-13 LAB — CBC WITH DIFFERENTIAL/PLATELET
Basophils Absolute: 0 10*3/uL (ref 0.0–0.1)
Basophils Relative: 0.7 % (ref 0.0–3.0)
Eosinophils Absolute: 0.1 10*3/uL (ref 0.0–0.7)
Eosinophils Relative: 2.1 % (ref 0.0–5.0)
HCT: 32.4 % — ABNORMAL LOW (ref 36.0–46.0)
Hemoglobin: 11 g/dL — ABNORMAL LOW (ref 12.0–15.0)
Lymphocytes Relative: 11.4 % — ABNORMAL LOW (ref 12.0–46.0)
Lymphs Abs: 0.8 10*3/uL (ref 0.7–4.0)
MCHC: 34 g/dL (ref 30.0–36.0)
MCV: 85.2 fl (ref 78.0–100.0)
Monocytes Absolute: 0.4 10*3/uL (ref 0.1–1.0)
Monocytes Relative: 5.7 % (ref 3.0–12.0)
Neutro Abs: 5.7 10*3/uL (ref 1.4–7.7)
Neutrophils Relative %: 80.1 % — ABNORMAL HIGH (ref 43.0–77.0)
Platelets: 326 10*3/uL (ref 150.0–400.0)
RBC: 3.8 Mil/uL — ABNORMAL LOW (ref 3.87–5.11)
RDW: 13.3 % (ref 11.5–15.5)
WBC: 7.1 10*3/uL (ref 4.0–10.5)

## 2022-03-13 LAB — URINALYSIS, ROUTINE W REFLEX MICROSCOPIC
Bilirubin Urine: NEGATIVE
Ketones, ur: NEGATIVE
Nitrite: POSITIVE — AB
Specific Gravity, Urine: <=1.005 — AB (ref 1.000–1.030)
Total Protein, Urine: NEGATIVE
Urine Glucose: NEGATIVE
Urobilinogen, UA: 0.2 (ref 0.0–1.0)
pH: 7 (ref 5.0–8.0)

## 2022-03-13 LAB — HEPATIC FUNCTION PANEL
ALT: 11 U/L (ref 0–35)
AST: 14 U/L (ref 0–37)
Albumin: 3.8 g/dL (ref 3.5–5.2)
Alkaline Phosphatase: 53 U/L (ref 39–117)
Bilirubin, Direct: 0.1 mg/dL (ref 0.0–0.3)
Total Bilirubin: 0.5 mg/dL (ref 0.2–1.2)
Total Protein: 6.5 g/dL (ref 6.0–8.3)

## 2022-03-13 LAB — VITAMIN D 25 HYDROXY (VIT D DEFICIENCY, FRACTURES): VITD: 47.15 ng/mL (ref 30.00–100.00)

## 2022-03-13 LAB — TSH: TSH: 3.09 u[IU]/mL (ref 0.35–5.50)

## 2022-03-13 LAB — VITAMIN B12: Vitamin B-12: 807 pg/mL (ref 211–911)

## 2022-03-13 LAB — HEMOGLOBIN A1C: Hgb A1c MFr Bld: 6.1 % (ref 4.6–6.5)

## 2022-03-13 MED ORDER — SERTRALINE HCL 100 MG PO TABS
ORAL_TABLET | ORAL | 3 refills | Status: AC
Start: 2022-03-13 — End: ?

## 2022-03-13 MED ORDER — LABETALOL HCL 300 MG PO TABS
300.0000 mg | ORAL_TABLET | Freq: Two times a day (BID) | ORAL | 3 refills | Status: AC
Start: 2022-03-13 — End: ?

## 2022-03-13 MED ORDER — ROSUVASTATIN CALCIUM 20 MG PO TABS
20.0000 mg | ORAL_TABLET | Freq: Every day | ORAL | 3 refills | Status: DC
Start: 2022-03-13 — End: 2022-05-19

## 2022-03-13 MED ORDER — TELMISARTAN 80 MG PO TABS: 80.0000 mg | ORAL_TABLET | Freq: Every day | ORAL | 3 refills | Status: AC

## 2022-03-13 MED ORDER — AMLODIPINE BESYLATE 5 MG PO TABS: 5.0000 mg | ORAL_TABLET | Freq: Every day | ORAL | 3 refills | Status: DC

## 2022-03-13 MED ORDER — HYDROCORTISONE (PERIANAL) 2.5 % EX CREA
1.0000 | TOPICAL_CREAM | Freq: Two times a day (BID) | CUTANEOUS | 1 refills | Status: DC
Start: 2022-03-13 — End: 2022-04-03

## 2022-03-13 MED ORDER — DONEPEZIL HCL 10 MG PO TABS: 10.0000 mg | ORAL_TABLET | Freq: Every day | ORAL | 3 refills | Status: DC

## 2022-03-13 MED ORDER — ARIPIPRAZOLE 5 MG PO TABS: 5.0000 mg | ORAL_TABLET | Freq: Every day | ORAL | 3 refills | Status: DC

## 2022-03-13 NOTE — Progress Notes (Unsigned)
Patient ID: Alyssa Fernandez, female   DOB: 05/13/1941, 81 y.o.   MRN: [id number]        Chief Complaint: follow up HTN, HLD and hyperglycemia ***       HPI:  Alyssa Fernandez is a 81 y.o. 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 She has never used smokeless tobacco. She reports that she does not drink alcohol and does not use drugs. family history includes Stroke in her mother. Allergies  Allergen Reactions   Codeine Swelling   Sulfonamide Derivatives Swelling   Zocor [Simvastatin] Other (See Comments)    Cognitive decrease   Current Outpatient Medications on File Prior to Visit  Medication Sig Dispense Refill   ALPRAZolam (XANAX) 0.5 MG tablet 1 tab by mouth twice per day as needed 60 tablet 5   amLODipine (NORVASC) 5 MG tablet Take 1 tablet (5 mg total) by mouth daily. 90 tablet 3   ARIPiprazole (ABILIFY) 5 MG tablet Take 1 tablet (5 mg total) by mouth daily. 90 tablet 3   hydrochlorothiazide (HYDRODIURIL) 25 MG tablet Take 25 mg by mouth daily.      labetalol (NORMODYNE) 300 MG tablet Take 1 tablet (300 mg total) by mouth 2 (two) times daily. 180 tablet 3   rosuvastatin (CRESTOR) 20 MG tablet Take 1 tablet (20 mg total) by mouth daily. 90 tablet 3   sertraline (ZOLOFT) 100 MG tablet Take 1 tablet (100 mg total) by mouth 2 (two) times daily. 180 tablet 3   telmisartan (MICARDIS) 80 MG tablet Take 1 tablet (80 mg total) by mouth daily. 90 tablet 3   vitamin B-12 (CYANOCOBALAMIN) 1000 MCG tablet Take 1 tablet (1,000 mcg total) by mouth daily. 90 tablet 3   Cholecalciferol (VITAMIN D3) 50 MCG (2000 UT) capsule TAKE ONE CAPSULE EACH DAY (Patient not taking: Reported on 03/13/2022) 90 capsule 99   donepezil (ARICEPT) 10 MG tablet Take 1 tablet (10 mg total) by mouth at bedtime. (Patient not taking: Reported on 03/13/2022) 90 tablet 3   pantoprazole (PROTONIX) 40 MG tablet Take 1 tablet (40 mg total) by mouth daily. (Patient not taking: Reported on 03/13/2022) 90 tablet 3   potassium chloride (KLOR-CON 10) 10 MEQ  tablet Take 1 tablet (10 mEq total) by mouth daily. (Patient not taking: Reported on 03/13/2022) 5 tablet 0   sucralfate (CARAFATE) 1 g tablet Take 1 tablet (1 g total) by mouth 4 (four) times daily. (Patient not taking: Reported on 03/13/2022) 120 tablet 0   No current facility-administered medications on file prior to visit.        ROS:  All others reviewed and negative.  Objective        PE:  BP 128/80 (BP Location: Right Arm, Patient Position: Sitting, Cuff Size: Normal)   Pulse (!) 58   Ht '5\' 3"'$  (1.6 m)   Wt 135 lb (61.2 kg)   SpO2 96%   BMI 23.91 kg/m                 Constitutional: Pt appears in NAD               HENT: Head: NCAT.                Right Ear: External ear normal.                 Left Ear: External ear normal.                Eyes: . Pupils are equal, round, and reactive to light. Conjunctivae and EOM are normal               Nose: without d/c or deformity               Neck: Neck supple. Gross normal ROM                Cardiovascular: Normal rate and regular rhythm.                 Pulmonary/Chest: Effort normal and breath sounds without rales or wheezing.                Abd:  Soft, NT, ND, + BS, no organomegaly               Neurological: Pt is alert. At baseline orientation, motor grossly intact               Skin: Skin is warm. No rashes, no other new lesions, LE edema - ***               Psychiatric: Pt behavior is normal without agitation   Micro: none  Cardiac tracings I have personally interpreted today:  none  Pertinent Radiological findings (summarize): none   Lab Results  Component Value Date   WBC 7.1 12/12/2021   HGB 12.7 12/12/2021   HCT 37.2 12/12/2021   PLT 225.0 12/12/2021   GLUCOSE 116 (H) 12/12/2021   CHOL 124 12/12/2021   TRIG 185.0 (H) 12/12/2021   HDL 41.20 12/12/2021   LDLCALC 46 12/12/2021   ALT 15 12/12/2021   AST 21 12/12/2021   NA 135 12/12/2021   K 3.1 (L) 12/12/2021   CL 95 (L) 12/12/2021   CREATININE 1.35 (H) 12/12/2021   BUN 24 (H) 12/12/2021   CO2 30 12/12/2021   TSH 2.74 12/12/2021   INR 1.14 04/14/2017   HGBA1C 5.9 12/12/2021   Assessment/Plan:  LODIE Fernandez is a 81 y.o. White or Caucasian [1] female with  has a past medical history of Anxiety, Atherosclerosis of aorta (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  No problem-specific Assessment & Plan notes found for this encounter.  Followup: No follow-ups on file.  Cathlean Cower, MD 03/13/2022 11:09 AM [facility]

## 2022-03-13 NOTE — Patient Instructions (Addendum)
Please take all new medication as prescribed - the cream as needed for the hemorrhoids  Ok to STOP the HCT (hydrochlorothiazide) 25 mg  Ok to STOP the potassium pill (that you were not taking anyway)  Ok to STOP the protonix and carafate if you no longer have stomach issue or reflux  Ok to restart the zoloft at 100 mg per day, as this should not cause sleepiness  Please continue all other medications as before, and refills have been done if requested - the aricept restarted, as well as refills for all the other medications  Please have the pharmacy call with any other refills you may need.  Please continue your efforts at being more active, low cholesterol diet, and weight control.  You are otherwise up to date with prevention measures today.  Please keep your appointments with your specialists as you may have planned  Please go to the LAB at the blood drawing area for the tests to be done  You will be contacted by phone if any changes need to be made immediately.  Otherwise, you will receive a letter about your results with an explanation, but please check with MyChart first.  Please remember to sign up for MyChart if you have not done so, as this will be important to you in the future with finding out test results, communicating by private email, and scheduling acute appointments online when needed.  Please make an Appointment to return in 3 months, or sooner if needed

## 2022-03-14 ENCOUNTER — Other Ambulatory Visit: Payer: Self-pay | Admitting: Internal Medicine

## 2022-03-14 ENCOUNTER — Encounter: Payer: Self-pay | Admitting: Internal Medicine

## 2022-03-14 MED ORDER — CEPHALEXIN 500 MG PO CAPS: 500.0000 mg | ORAL_CAPSULE | Freq: Three times a day (TID) | ORAL | 0 refills | Status: DC

## 2022-03-14 NOTE — Assessment & Plan Note (Signed)

## 2022-03-14 NOTE — Assessment & Plan Note (Signed)
Lab Results  Component Value Date   CREATININE 1.16 03/13/2022   Stable overall, cont to avoid nephrotoxins

## 2022-03-14 NOTE — Assessment & Plan Note (Signed)
BP Readings from Last 3 Encounters:  03/13/22 128/80  12/12/21 132/74  10/26/21 110/70   Stable, pt to continue medical treatment amlodipine 5 mg qd, labetolol 300 bid, micardis 80 qd

## 2022-03-14 NOTE — Assessment & Plan Note (Signed)
Last vitamin D Lab Results  Component Value Date   VD25OH 47.15 03/13/2022   Stable, cont oral replacement

## 2022-03-14 NOTE — Assessment & Plan Note (Signed)
Lab Results  Component Value Date   LDLCALC 67 03/13/2022   Stable, pt to continue current statin crestor 20 mg qd

## 2022-03-14 NOTE — Assessment & Plan Note (Signed)
Stable to improved, ok for decreased zoloft at 100 mg qd

## 2022-03-14 NOTE — Assessment & Plan Note (Signed)
Resolved, pt asks to d/c PPI and carafate and follow, appears she is not taking these at this time

## 2022-03-14 NOTE — Assessment & Plan Note (Signed)
Mild worsening uncontrlled, for restart aricept 5 qd

## 2022-03-15 ENCOUNTER — Telehealth: Payer: Self-pay | Admitting: Internal Medicine

## 2022-03-15 DIAGNOSIS — H16223 Keratoconjunctivitis sicca, not specified as Sjogren's, bilateral: Secondary | ICD-10-CM | POA: Diagnosis not present

## 2022-03-15 NOTE — Telephone Encounter (Signed)
Spoke with patient's daughter regarding lab results. 

## 2022-03-15 NOTE — Telephone Encounter (Signed)
Patient returned your call please call back.

## 2022-03-22 ENCOUNTER — Ambulatory Visit (INDEPENDENT_AMBULATORY_CARE_PROVIDER_SITE_OTHER): Payer: PPO | Admitting: Internal Medicine

## 2022-03-22 ENCOUNTER — Encounter: Payer: Self-pay | Admitting: Internal Medicine

## 2022-03-22 VITALS — BP 128/70 | HR 60 | Temp 98.2°F | Ht 63.0 in | Wt 132.0 lb

## 2022-03-22 DIAGNOSIS — E559 Vitamin D deficiency, unspecified: Secondary | ICD-10-CM | POA: Diagnosis not present

## 2022-03-22 DIAGNOSIS — K648 Other hemorrhoids: Secondary | ICD-10-CM

## 2022-03-22 DIAGNOSIS — I10 Essential (primary) hypertension: Secondary | ICD-10-CM | POA: Diagnosis not present

## 2022-03-22 DIAGNOSIS — N390 Urinary tract infection, site not specified: Secondary | ICD-10-CM

## 2022-03-22 HISTORY — DX: Other hemorrhoids: K64.8

## 2022-03-22 HISTORY — DX: Urinary tract infection, site not specified: N39.0

## 2022-03-22 NOTE — Progress Notes (Signed)
Patient ID: Alyssa Fernandez, female   DOB: 12/31/1940, 81 y.o.   MRN: [id number]        Chief Complaint: follow up grade 3 intermal hemorrhoid, here with daughter       HPI:  Alyssa Fernandez is a 81 y.o. female here with c/o internal hemorrhoid that continue to go in and out with some discomfort in the past week, without bleeding, fever, drainage.  Denies urinary symptoms such as dysuria, frequency, urgency, flank pain, hematuria or n/v, fever, chills, and asks for f/u urine to make sure infection is resolved.  Pt denies chest pain, increased sob or doe, wheezing, orthopnea, PND, increased LE swelling, palpitations, dizziness or syncope.   Pt denies polydipsia, polyuria, or new focal neuro s/s.   Dementia overall stable symptomatically, and not assoc with behavioral changes such as hallucinations, paranoia, or agitation.       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 She has never used smokeless tobacco. She reports that she does not drink alcohol and does not use drugs. family history  includes Stroke in her mother. Allergies  Allergen Reactions   Codeine Swelling   Sulfonamide Derivatives Swelling   Zocor [Simvastatin] Other (See Comments)    Cognitive decrease   Current Outpatient Medications on File Prior to Visit  Medication Sig Dispense Refill   ALPRAZolam (XANAX) 0.5 MG tablet 1 tab by mouth twice per day as needed 60 tablet 5   amLODipine (NORVASC) 5 MG tablet Take 1 tablet (5 mg total) by mouth daily. 90 tablet 3   ARIPiprazole (ABILIFY) 5 MG tablet Take 1 tablet (5 mg total) by mouth daily. 90 tablet 3   donepezil (ARICEPT) 10 MG tablet Take 1 tablet (10 mg total) by mouth at bedtime. 90 tablet 3   hydrocortisone (ANUSOL-HC) 2.5 % rectal cream Place 1 Application rectally 2 (two) times daily. 30 g 1   labetalol (NORMODYNE) 300 MG tablet Take 1 tablet (300 mg total) by mouth 2 (two) times daily. 180 tablet 3   rosuvastatin (CRESTOR) 20 MG tablet Take 1 tablet (20 mg total) by mouth daily. 90 tablet 3   sertraline (ZOLOFT) 100 MG tablet 1 tab by mouth once daily 90 tablet 3   telmisartan (MICARDIS) 80 MG tablet Take 1 tablet (  80 mg total) by mouth daily. 90 tablet 3   vitamin B-12 (CYANOCOBALAMIN) 1000 MCG tablet Take 1 tablet (1,000 mcg total) by mouth daily. 90 tablet 3   No current facility-administered medications on file prior to visit.        ROS:  All others reviewed and negative.  Objective        PE:  BP 128/70 (BP Location: Right Arm, Patient Position: Sitting, Cuff Size: Normal)   Pulse 60   Temp 98.2 F (36.8 C) (Oral)   Ht '5\' 3"'$  (1.6 m)   Wt 132 lb (59.9 kg)   SpO2 96%   BMI 23.38 kg/m                 Constitutional: Pt appears in NAD               HENT: Head: NCAT.                Right Ear: External ear normal.                 Left Ear: External ear normal.                Eyes: . Pupils are equal, round, and reactive to light. Conjunctivae and EOM are normal               Nose: without d/c or deformity               Neck: Neck supple.  Gross normal ROM               Cardiovascular: Normal rate and regular rhythm.                 Pulmonary/Chest: Effort normal and breath sounds without rales or wheezing.                Abd:  Soft, NT, ND, + BS, no organomegaly               Neurological: Pt is alert. At baseline orientation, motor grossly intact               Skin: Skin is warm. No rashes, no other new lesions, LE edema - none               Psychiatric: Pt behavior is normal without agitation   Micro: none  Cardiac tracings I have personally interpreted today:  none  Pertinent Radiological findings (summarize): none   Lab Results  Component Value Date   WBC 7.1 03/13/2022   HGB 11.0 (L) 03/13/2022   HCT 32.4 (L) 03/13/2022   PLT 326.0 03/13/2022   GLUCOSE 109 (H) 03/13/2022   CHOL 117 03/13/2022   TRIG 85.0 03/13/2022   HDL 33.20 (L) 03/13/2022   LDLCALC 67 03/13/2022   ALT 11 03/13/2022   AST 14 03/13/2022   NA 132 (L) 03/13/2022   K 3.4 (L) 03/13/2022   CL 95 (L) 03/13/2022   CREATININE 1.16 03/13/2022   BUN 14 03/13/2022   CO2 29 03/13/2022   TSH 3.09 03/13/2022   INR 1.14 04/14/2017   HGBA1C 6.1 03/13/2022   Assessment/Plan:  Alyssa Fernandez is a 81 y.o. 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  Vitamin D deficiency Last vitamin D  Lab Results  Component Value Date   VD25OH 47.15 03/13/2022   Stable, cont oral replacement   UTI (urinary tract infection) Symptoms improved for f/u UA as per request, but suspect is resolved  Internal hemorrhoids Now evolving to grade 3 it seems, for GI referral, anusol HC prn  Essential hypertension BP Readings from Last 3 Encounters:  03/22/22 128/70  03/13/22 128/80   12/12/21 132/74   Stable, pt to continue medical treatment norvasc 5 mg qd, labetolol 300 bid, micardis 80 qd  Followup: Return if symptoms worsen or fail to improve.  Cathlean Cower, MD 03/25/2022 8:22 PM [facility]

## 2022-03-22 NOTE — Assessment & Plan Note (Signed)
Last vitamin D Lab Results  Component Value Date   VD25OH 47.15 03/13/2022   Stable, cont oral replacement

## 2022-03-22 NOTE — Patient Instructions (Signed)
Please continue all other medications as before, and refills have been done if requested.  Please have the pharmacy call with any other refills you may need.  Please continue your efforts at being more active, low cholesterol diet, and weight control  Please keep your appointments with your specialists as you may have planned  You will be contacted regarding the referral for: Gastroenterology  Please go to the LAB at the blood drawing area for the tests to be done - just the urine testing today  You will be contacted by phone if any changes need to be made immediately.  Otherwise, you will receive a letter about your results with an explanation, but please check with MyChart first.  Please remember to sign up for MyChart if you have not done so, as this will be important to you in the future with finding out test results, communicating by private email, and scheduling acute appointments online when needed.

## 2022-03-23 LAB — URINE CULTURE

## 2022-03-25 ENCOUNTER — Encounter: Payer: Self-pay | Admitting: Internal Medicine

## 2022-03-25 NOTE — Assessment & Plan Note (Signed)
BP Readings from Last 3 Encounters:  03/22/22 128/70  03/13/22 128/80  12/12/21 132/74   Stable, pt to continue medical treatment norvasc 5 mg qd, labetolol 300 bid, micardis 80 qd

## 2022-03-25 NOTE — Assessment & Plan Note (Signed)
Now evolving to grade 3 it seems, for GI referral, anusol HC prn

## 2022-03-25 NOTE — Assessment & Plan Note (Signed)
Symptoms improved for f/u UA as per request, but suspect is resolved

## 2022-03-29 ENCOUNTER — Ambulatory Visit (INDEPENDENT_AMBULATORY_CARE_PROVIDER_SITE_OTHER): Payer: PPO | Admitting: Physician Assistant

## 2022-03-29 ENCOUNTER — Encounter: Payer: Self-pay | Admitting: Physician Assistant

## 2022-03-29 ENCOUNTER — Other Ambulatory Visit (INDEPENDENT_AMBULATORY_CARE_PROVIDER_SITE_OTHER): Payer: PPO

## 2022-03-29 VITALS — BP 134/79 | HR 68 | Resp 20 | Ht 64.0 in | Wt 125.0 lb

## 2022-03-29 DIAGNOSIS — R413 Other amnesia: Secondary | ICD-10-CM

## 2022-03-29 DIAGNOSIS — G309 Alzheimer's disease, unspecified: Secondary | ICD-10-CM

## 2022-03-29 DIAGNOSIS — F0280 Dementia in other diseases classified elsewhere without behavioral disturbance: Secondary | ICD-10-CM | POA: Diagnosis not present

## 2022-03-29 LAB — VITAMIN B12: Vitamin B-12: >1500 pg/mL — ABNORMAL HIGH (ref 211–911)

## 2022-03-29 NOTE — Progress Notes (Signed)
B12 is good. Thanks

## 2022-03-29 NOTE — Progress Notes (Unsigned)
Assessment/Plan:    The patient is seen in neurologic consultation at the request of Biagio Borg, MD for the evaluation of memory.  Alyssa Fernandez is a very pleasant 81 y.o. year old RH female with  a history of hypertension, hyperlipidemia, situational depression, anxiety, hypothyroidism, insomnia, narcolepsy, CKD, vitamin D deficiency arthritis, and diagnosis of dementia per PCP on donepezil 10 mg daily, seen today for evaluation of memory loss. MoCA today is    Recommendations:   Memory Loss   MRI brain without contrast to assess for underlying structural abnormality and assess vascular load  Neurocognitive testing to further evaluate cognitive concerns and determine other underlying cause of memory changes, including potential contribution from sleep, anxiety, or depression  Continue donepezil 10 mg daily (as per PCP) Check B12  Folllow up in 1 month  Subjective:    The patient is accompanied by  who supplements the history.    How long did patient have memory difficulties? 3-4 month, have to write thing down. Dtr 2y ago. Patient lives with:Patient lives alone repeats oneself? Endorsed, for the last 2 years.  Disoriented when walking into a room?  Patient denies   Leaving objects in unusual places?  Patient denies   Ambulates  with difficulty?   1 mile a day until the L side hurts. She is not as active since the death of her h    Recent falls?  Patient denies   Any head injuries?  Patient denies   History of seizures?   Patient denies   Wandering behavior?  Patient denies   Patient drives?  Short distances  Any mood changes ?  Patient is dealing with grief/depression after her husband's death to [place] few years ago Hallucinations?  Patient denies   Paranoia?  Patient denies   Patient reports that sleeps well without vivid dreams, REM behavior or sleepwalking    History of sleep apnea?  Patient denies   Any hygiene concerns?  Patient denies   Independent of bathing and  dressing?  Endorsed  Does the patient needs help with medications?  pillbox pill pack  Patient denies   Who is in charge of the finances?  Patient is in charge   Any changes in appetite?  Patient denies   Patient have trouble swallowing? Patient denies   Does the patient cook?  Patient denies   Any kitchen accidents such as leaving the stove on? Patient denies   Any headaches?  Patient denies   Double vision? Patient denies   Any focal numbness or tingling?  Patient denies   Chronic back pain Patient denies   Unilateral weakness?  Patient denies   Any tremors?  Patient denies   Any history of anosmia?  Patient denies   Any incontinence of urine?  Endorsed, wears depends  Any bowel dysfunction?   Hemorrhoid, sometimes lose stools  History of heavy alcohol intake?  Patient denies   History of heavy tobacco use?  Patient denies   Family history of dementia?  Patient denies.   Recent labs 03/13/2022 remarkable for hemoglobin 11, hematocrit 32.4, HDL 33.2, sodium 132, 4, TSH 3.09.1.  Allergies  Allergen Reactions   Codeine Swelling   Sulfonamide Derivatives Swelling   Zocor [Simvastatin] Other (See Comments)    Cognitive decrease    Current Outpatient Medications  Medication Instructions   ALPRAZolam (XANAX) 0.5 MG tablet 1 tab by mouth twice per day as needed   amLODipine (NORVASC) 5 mg, Oral, Daily   ARIPiprazole (  ABILIFY) 5 mg, Oral, Daily   cyanocobalamin (VITAMIN B12) 1,000 mcg, Oral, Daily   donepezil (ARICEPT) 10 mg, Oral, Daily at bedtime   hydrocortisone (ANUSOL-HC) 2.5 % rectal cream 1 Application, Rectal, 2 times daily   labetalol (NORMODYNE) 300 mg, Oral, 2 times daily   rosuvastatin (CRESTOR) 20 mg, Oral, Daily   sertraline (ZOLOFT) 100 MG tablet 1 tab by mouth once daily   telmisartan (MICARDIS) 80 mg, Oral, Daily     VITALS:  There were no vitals filed for this visit.    10/26/2021    9:20 AM 10/25/2020    9:28 AM 05/26/2020    1:29 PM 05/08/2019    2:35 PM  03/29/2018    4:27 PM  Depression screen PHQ 2/9  Decreased Interest 0 1 0 0 0  Down, Depressed, Hopeless 0 1 0 1 1  PHQ - 2 Score 0 2 0 1 1  Altered sleeping     2  Tired, decreased energy     1  Change in appetite     0  Feeling bad or failure about yourself      0  Trouble concentrating     0  Moving slowly or fidgety/restless     0  Suicidal thoughts     0  PHQ-9 Score     4  Difficult doing work/chores     Not difficult at all    PHYSICAL EXAM   HEENT:  Normocephalic, atraumatic. The mucous membranes are moist. The superficial temporal arteries are without ropiness or tenderness. Cardiovascular: Regular rate and rhythm. Lungs: Clear to auscultation bilaterally. Neck: There are no carotid bruits noted bilaterally.  NEUROLOGICAL:     No data to display             03/29/2018    4:28 PM  MMSE - Mini Mental State Exam  Orientation to time 5  Orientation to Place 5  Registration 3  Attention/ Calculation 4  Recall 1  Language- name 2 objects 2  Language- repeat 1  Language- follow 3 step command 3  Language- read & follow direction 1  Write a sentence 1  Copy design 1  Total score 27     Orientation:  Alert and oriented to person, place and time. No aphasia or dysarthria. Fund of knowledge is appropriate. Recent memory impaired and remote memory intact.  Attention and concentration are normal.  Able to name objects and repeat phrases. Delayed recall   Cranial nerves: There is good facial symmetry. Extraocular muscles are intact and visual fields are full to confrontational testing. Speech is fluent and clear. Soft palate rises symmetrically and there is no tongue deviation. Hearing is intact to conversational tone. Tone: Tone is good throughout. Sensation: Sensation is intact to light touch and pinprick throughout. Vibration is intact at the bilateral big toe.There is no extinction with double simultaneous stimulation. There is no sensory dermatomal level  identified. Coordination: The patient has no difficulty with RAM's or FNF bilaterally. Normal finger to nose  Motor: Strength is 5/5 in the bilateral upper and lower extremities. There is no pronator drift. There are no fasciculations noted. DTR's: Deep tendon reflexes are 2/4 at the bilateral biceps, triceps, brachioradialis, patella and achilles.  Plantar responses are downgoing bilaterally. Gait and Station: The patient is able to ambulate without difficulty.The patient is able to heel toe walk without any difficulty.The patient is able to ambulate in a tandem fashion. The patient is able to stand in the [place]  position.     Thank you for allowing [place] the opportunity to participate in the care of this nice patient. Please do not hesitate to contact us for any questions or concerns.   Total time spent on today's visit was *** minutes dedicated to this patient today, preparing to see patient, examining the patient, ordering tests and/or medications and counseling the patient, documenting clinical information in the EHR or other health record, independently interpreting results and communicating results to the patient/family, discussing treatment and goals, answering patient's questions and coordinating care.  Cc:  Biagio Borg, MD  Sharene Butters 03/29/2022 6:59 AM

## 2022-03-29 NOTE — Patient Instructions (Addendum)
It was a pleasure to see you today at our office.   Recommendations:  Neurocognitive evaluation at our office MRI of the brain, the radiology office will call you to arrange you appointment Check labs today Follow up in 1 month  Continue donepezil 10 mg daily  Recommend psychotherapy   Whom to call:  Memory  decline, memory medications: Call our office [phone]   For psychiatric meds, mood meds: Please have your primary care physician manage these medications.   Counseling regarding caregiver distress, including caregiver depression, anxiety and issues regarding community resources, adult day care programs, adult living facilities, or memory care questions:   Feel free to contact [place], Social Worker at [phone]   For assessment of decision of mental capacity and competency:  Call Dr. Anthoney Harada, geriatric psychiatrist at [phone]  For guidance in geriatric dementia issues please call [facility] [phone]  For guidance regarding [facility] and if placement were needed at the facility, contact Arnell Asal, Social Worker tel: [phone]  Consider [place]  [place], [place] [address] [phone]  Hours of Operation Mondays to Thursdays: 8 am to 8 pm,Fridays: 9 am to 8 pm, Saturdays: 9 am to 1 pm Sundays: Closed  [email or web address]   If you have any severe symptoms of a stroke, or other severe issues such as confusion,severe chills or fever, etc call 911 or go to the ER as you may need to be evaluated further   Feel free to visit Facebook page " [employer]" for tips of how to care for people with memory problems.   Feel free to go to the following database for funded clinical studies conducted around the world: http://saunders.com/   https://www.triadclinicaltrials.com/     RECOMMENDATIONS FOR  ALL PATIENTS WITH MEMORY PROBLEMS: 1. Continue to exercise (Recommend 30 minutes of walking everyday, or 3 hours every week) 2. Increase social interactions - continue going to [place] and enjoy social gatherings with friends and family 3. Eat healthy, avoid fried foods and eat more fruits and vegetables 4. Maintain adequate blood pressure, blood sugar, and blood cholesterol level. Reducing the risk of stroke and cardiovascular disease also helps promoting better memory. 5. Avoid stressful situations. Live a simple life and avoid aggravations. Organize your time and prepare for the next day in anticipation. 6. Sleep well, avoid any interruptions of sleep and avoid any distractions in the bedroom that may interfere with adequate sleep quality 7. Avoid sugar, avoid sweets as there is a strong link between excessive sugar intake, diabetes, and cognitive impairment We discussed the Mediterranean diet, which has been shown to help patients reduce the risk of progressive memory disorders and reduces cardiovascular risk. This includes eating fish, eat fruits and green leafy vegetables, nuts like almonds and hazelnuts, walnuts, and also use olive oil. Avoid fast foods and fried foods as much as possible. Avoid sweets and sugar as sugar use has been linked to worsening of memory function.  There is always a concern of gradual progression of memory problems. If this is the case, then we may need to adjust level of care according to patient needs. Support, both to the patient and caregiver, should then be put into place.      You have been referred for a neuropsychological evaluation (i.e., evaluation of memory and thinking abilities). Please bring someone with you to this appointment if possible, as it is helpful for the doctor to hear from both you and another adult who  knows you well. Please bring eyeglasses and hearing aids if you wear them.    The evaluation will take approximately 3 hours and has two  parts:   The first part is a clinical interview with the neuropsychologist (Dr. Melvyn Novas or Dr. Nicole Kindred). During the interview, the neuropsychologist will speak with you and the individual you brought to the appointment.    The second part of the evaluation is testing with the doctor's technician Hinton Dyer or Maudie Mercury). During the testing, the technician will ask you to remember different types of material, solve problems, and answer some questionnaires. Your family member will not be present for this portion of the evaluation.   Please note: We must reserve several hours of the neuropsychologist's time and the psychometrician's time for your evaluation appointment. As such, there is a No-Show fee of $100. If you are unable to attend any of your appointments, please contact our office as soon as possible to reschedule.    FALL PRECAUTIONS: Be cautious when walking. Scan the area for obstacles that may increase the risk of trips and falls. When getting up in the mornings, sit up at the edge of the bed for a few minutes before getting out of bed. Consider elevating the bed at the head end to avoid drop of blood pressure when getting up. Walk always in a well-lit room (use night lights in the walls). Avoid area rugs or power cords from appliances in the middle of the walkways. Use a walker or a cane if necessary and consider physical therapy for balance exercise. Get your eyesight checked regularly.  FINANCIAL OVERSIGHT: Supervision, especially oversight when making financial decisions or transactions is also recommended.  HOME SAFETY: Consider the safety of the kitchen when operating appliances like stoves, microwave oven, and blender. Consider having supervision and share cooking responsibilities until no longer able to participate in those. Accidents with firearms and other hazards in the house should be identified and addressed as well.   ABILITY TO BE LEFT ALONE: If patient is unable to contact 911 operator,  consider using LifeLine, or when the need is there, arrange for someone to stay with patients. Smoking is a fire hazard, consider supervision or cessation. Risk of wandering should be assessed by caregiver and if detected at any point, supervision and safe proof recommendations should be instituted.  MEDICATION SUPERVISION: Inability to self-administer medication needs to be constantly addressed. Implement a mechanism to ensure safe administration of the medications.   DRIVING: Regarding driving, in patients with progressive memory problems, driving will be impaired. We advise to have someone else do the driving if trouble finding directions or if minor accidents are reported. Independent driving assessment is available to determine safety of driving.   If you are interested in the driving assessment, you can contact the following:  [employer] in [place]  Lemay Aragon [phone] or [phone]    [place] refers to food and lifestyle choices that are based on the traditions of countries located on the [employer]. This way of eating has been shown to help prevent certain conditions and improve outcomes for people who have chronic diseases, like kidney disease and heart disease. What are tips for following this plan? Lifestyle  Cook and eat meals together with your family, when possible. Drink enough fluid to keep your urine clear or pale yellow. Be physically active every day. This includes: Aerobic exercise like running or  swimming. Leisure activities like gardening, walking, or housework. Get 7-8 hours of sleep each night. If recommended by your health care provider, drink red wine in moderation. This means 1 glass a day for nonpregnant women and 2 glasses a day for men. A glass of wine equals 5 oz (150 mL). Reading food labels   Check the serving size of packaged foods. For foods such as rice and pasta, the serving size refers to the amount of cooked product, not dry. Check the total fat in packaged foods. Avoid foods that have saturated fat or trans fats. Check the ingredients list for added sugars, such as corn syrup. Shopping  At the grocery store, buy most of your food from the areas near the walls of the store. This includes: Fresh fruits and vegetables (produce). Grains, beans, nuts, and seeds. Some of these may be available in unpackaged forms or large amounts (in bulk). Fresh seafood. Poultry and eggs. Low-fat dairy products. Buy whole ingredients instead of prepackaged foods. Buy fresh fruits and vegetables in-season from local farmers markets. Buy frozen fruits and vegetables in resealable bags. If you do not have access to quality fresh seafood, buy precooked frozen shrimp or canned fish, such as tuna, salmon, or sardines. Buy small amounts of raw or cooked vegetables, salads, or olives from the deli or salad bar at your store. Stock your pantry so you always have certain foods on hand, such as olive oil, canned tuna, canned tomatoes, rice, pasta, and beans. Cooking  Cook foods with extra-virgin olive oil instead of using butter or other vegetable oils. Have meat as a side dish, and have vegetables or grains as your main dish. This means having meat in small portions or adding small amounts of meat to foods like pasta or stew. Use beans or vegetables instead of meat in common dishes like chili or lasagna. Experiment with different cooking methods. Try roasting or broiling vegetables instead of steaming or sauteing them. Add frozen vegetables to soups, stews, pasta, or rice. Add nuts or seeds for added healthy fat at each meal. You can add these to yogurt, salads, or vegetable dishes. Marinate fish or vegetables using olive oil, lemon juice, garlic, and fresh herbs. Meal planning  Plan to eat 1  vegetarian meal one day each week. Try to work up to 2 vegetarian meals, if possible. Eat seafood 2 or more times a week. Have healthy snacks readily available, such as: Vegetable sticks with hummus. Greek yogurt. Fruit and nut trail mix. Eat balanced meals throughout the week. This includes: Fruit: 2-3 servings a day Vegetables: 4-5 servings a day Low-fat dairy: 2 servings a day Fish, poultry, or lean meat: 1 serving a day Beans and legumes: 2 or more servings a week Nuts and seeds: 1-2 servings a day Whole grains: 6-8 servings a day Extra-virgin olive oil: 3-4 servings a day Limit red meat and sweets to only a few servings a month What are my food choices? Mediterranean diet Recommended Grains: Whole-grain pasta. [place] rice. Bulgar wheat. Polenta. Couscous. Whole-wheat bread. [place]. Vegetables: Artichokes. Beets. Broccoli. Cabbage. Carrots. Eggplant. Green beans. Chard. Kale. Spinach. Onions. Leeks. Peas. Squash. Tomatoes. Peppers. Radishes. Fruits: Apples. Apricots. Avocado. Berries. Bananas. Cherries. Dates. Figs. Grapes. Lemons. Melon. Oranges. Peaches. Plums. Pomegranate. Meats and other protein foods: Beans. Almonds. Sunflower seeds. Pine nuts. Peanuts. [place]. Salmon. Scallops. Shrimp. [place]. Tilapia. Clams. Oysters. Eggs. Dairy: Low-fat milk. Cheese. Greek yogurt. Beverages: Water. Red wine. Herbal tea. Fats and oils: Extra virgin olive oil. Avocado oil.  Grape seed oil. Sweets and desserts: [place] yogurt with honey. Baked apples. Poached pears. Trail mix. Seasoning and other foods: Basil. Cilantro. Coriander. Cumin. Mint. Parsley. Sage. Rosemary. Tarragon. Garlic. Oregano. Thyme. Pepper. Balsalmic vinegar. Tahini. Hummus. Tomato sauce. Olives. Mushrooms. Limit these Grains: Prepackaged pasta or rice dishes. Prepackaged cereal with added sugar. Vegetables: Deep fried potatoes (french fries). Fruits: Fruit canned in syrup. Meats and other protein foods: Beef. Pork. Lamb.  Poultry with skin. Hot dogs. [place] Salines. Dairy: Ice cream. Sour cream. Whole milk. Beverages: Juice. Sugar-sweetened soft drinks. Beer. Liquor and spirits. Fats and oils: Butter. Canola oil. Vegetable oil. Beef fat (tallow). Lard. Sweets and desserts: Cookies. Cakes. Pies. Candy. Seasoning and other foods: Mayonnaise. Premade sauces and marinades. The items listed may not be a complete list. Talk with your dietitian about what dietary choices are right for you. Summary The Mediterranean diet includes both food and lifestyle choices. Eat a variety of fresh fruits and vegetables, beans, nuts, seeds, and whole grains. Limit the amount of red meat and sweets that you eat. Talk with your health care provider about whether it is safe for you to drink red wine in moderation. This means 1 glass a day for nonpregnant women and 2 glasses a day for men. A glass of wine equals 5 oz (150 mL). This information is not intended to replace advice given to you by your health care provider. Make sure you discuss any questions you have with your health care provider. Document Released: 03/30/2016 Document Revised: 05/02/2016 Document Reviewed: 03/30/2016  Elsevier Interactive Patient Education  2017 [place] provider has requested that you have labwork completed today. Please go to [facility] ([id number]) on the second floor of this building before leaving the office today. You do not need to check in. If you are not called within 15 minutes please check with the front desk.  We have sent a referral to [facility] for your MRI and they will call you directly to schedule your appointment. They are located at [place]. If you need to contact them directly please call [phone].

## 2022-04-03 ENCOUNTER — Other Ambulatory Visit: Payer: Self-pay | Admitting: Internal Medicine

## 2022-04-17 ENCOUNTER — Encounter: Payer: Self-pay | Admitting: Internal Medicine

## 2022-04-25 ENCOUNTER — Ambulatory Visit
Admission: RE | Admit: 2022-04-25 | Discharge: 2022-04-25 | Disposition: A | Discharge status: Home / Self Care | Payer: PPO | Source: Ambulatory Visit | Attending: Physician Assistant | Admitting: Physician Assistant

## 2022-04-25 DIAGNOSIS — R413 Other amnesia: Secondary | ICD-10-CM | POA: Diagnosis not present

## 2022-04-25 DIAGNOSIS — I6782 Cerebral ischemia: Secondary | ICD-10-CM | POA: Diagnosis not present

## 2022-04-30 NOTE — Progress Notes (Signed)
MRI brain shows Mild to moderate chronic  changed in the vessels, some loss of volume and some atrophy of the brain which may explain her memory changes.  No acute findings, thanks

## 2022-05-11 ENCOUNTER — Ambulatory Visit (INDEPENDENT_AMBULATORY_CARE_PROVIDER_SITE_OTHER): Payer: PPO | Admitting: Physician Assistant

## 2022-05-11 ENCOUNTER — Encounter: Payer: Self-pay | Admitting: Physician Assistant

## 2022-05-11 VITALS — BP 136/74 | HR 57 | Resp 18 | Ht 64.0 in | Wt 128.0 lb

## 2022-05-11 DIAGNOSIS — F0280 Dementia in other diseases classified elsewhere without behavioral disturbance: Secondary | ICD-10-CM

## 2022-05-11 DIAGNOSIS — F0150 Vascular dementia without behavioral disturbance: Secondary | ICD-10-CM

## 2022-05-11 DIAGNOSIS — G309 Alzheimer's disease, unspecified: Secondary | ICD-10-CM | POA: Diagnosis not present

## 2022-05-11 NOTE — Patient Instructions (Addendum)
It was a pleasure to see you today at our office.   Recommendations:  Neurocognitive evaluation at our office Continue donepezil 10 mg daily  Follow up 4/26 at 11:30   Whom to call:  Memory  decline, memory medications: Call our office [phone]   For psychiatric meds, mood meds: Please have your primary care physician manage these medications.   Counseling regarding caregiver distress, including caregiver depression, anxiety and issues regarding community resources, adult day care programs, adult living facilities, or memory care questions:   Feel free to contact [place], Social Worker at [phone]   For assessment of decision of mental capacity and competency:  Call Dr. Anthoney Harada, geriatric psychiatrist at [phone]  For guidance in geriatric dementia issues please call [facility] [phone]  For guidance regarding [facility] and if placement were needed at the facility, contact Arnell Asal, Social Worker tel: [phone]  Consider [facility]  [place], [place] [address] [phone]  Hours of Operation Mondays to Thursdays: 8 am to 8 pm,Fridays: 9 am to 8 pm, Saturdays: 9 am to 1 pm Sundays: Closed  [email or web address].gov/departments/parks-recreation/active-adults-50/[facility]   If you have any severe symptoms of a stroke, or other severe issues such as confusion,severe chills or fever, etc call 911 or go to the ER as you may need to be evaluated further   Feel free to visit Facebook page " [email or web address]" for tips of how to care for people with memory problems.     RECOMMENDATIONS FOR ALL PATIENTS WITH MEMORY PROBLEMS: 1. Continue to exercise (Recommend 30 minutes of walking everyday, or 3 hours every week) 2. Increase social interactions - continue going to [place] and enjoy social gatherings with friends and family 3. Eat healthy, avoid fried foods and eat more  fruits and vegetables 4. Maintain adequate blood pressure, blood sugar, and blood cholesterol level. Reducing the risk of stroke and cardiovascular disease also helps promoting better memory. 5. Avoid stressful situations. Live a simple life and avoid aggravations. Organize your time and prepare for the next day in anticipation. 6. Sleep well, avoid any interruptions of sleep and avoid any distractions in the bedroom that may interfere with adequate sleep quality 7. Avoid sugar, avoid sweets as there is a strong link between excessive sugar intake, diabetes, and cognitive impairment We discussed the Mediterranean diet, which has been shown to help patients reduce the risk of progressive memory disorders and reduces cardiovascular risk. This includes eating fish, eat fruits and green leafy vegetables, nuts like almonds and hazelnuts, walnuts, and also use olive oil. Avoid fast foods and fried foods as much as possible. Avoid sweets and sugar as sugar use has been linked to worsening of memory function.  There is always a concern of gradual progression of memory problems. If this is the case, then we may need to adjust level of care according to patient needs. Support, both to the patient and caregiver, should then be put into place.      You have been referred for a neuropsychological evaluation (i.e., evaluation of memory and thinking abilities). Please bring someone with you to this appointment if possible, as it is helpful for the doctor to hear from both you and another adult who knows you well. Please bring eyeglasses and hearing aids if you wear them.    The evaluation will take approximately 3 hours and has two parts:   The first part is a clinical interview with the neuropsychologist (Dr. Melvyn Novas or  Dr. Nicole Kindred). During the interview, the neuropsychologist will speak with you and the individual you brought to the appointment.    The second part of the evaluation is testing with the doctor's  technician Hinton Dyer or Maudie Mercury). During the testing, the technician will ask you to remember different types of material, solve problems, and answer some questionnaires. Your family member will not be present for this portion of the evaluation.   Please note: We must reserve several hours of the neuropsychologist's time and the psychometrician's time for your evaluation appointment. As such, there is a No-Show fee of $100. If you are unable to attend any of your appointments, please contact our office as soon as possible to reschedule.    FALL PRECAUTIONS: Be cautious when walking. Scan the area for obstacles that may increase the risk of trips and falls. When getting up in the mornings, sit up at the edge of the bed for a few minutes before getting out of bed. Consider elevating the bed at the head end to avoid drop of blood pressure when getting up. Walk always in a well-lit room (use night lights in the walls). Avoid area rugs or power cords from appliances in the middle of the walkways. Use a walker or a cane if necessary and consider physical therapy for balance exercise. Get your eyesight checked regularly.  FINANCIAL OVERSIGHT: Supervision, especially oversight when making financial decisions or transactions is also recommended.  HOME SAFETY: Consider the safety of the kitchen when operating appliances like stoves, microwave oven, and blender. Consider having supervision and share cooking responsibilities until no longer able to participate in those. Accidents with firearms and other hazards in the house should be identified and addressed as well.   ABILITY TO BE LEFT ALONE: If patient is unable to contact 911 operator, consider using LifeLine, or when the need is there, arrange for someone to stay with patients. Smoking is a fire hazard, consider supervision or cessation. Risk of wandering should be assessed by caregiver and if detected at any point, supervision and safe proof recommendations should be  instituted.  MEDICATION SUPERVISION: Inability to self-administer medication needs to be constantly addressed. Implement a mechanism to ensure safe administration of the medications.   DRIVING: Regarding driving, in patients with progressive memory problems, driving will be impaired. We advise to have someone else do the driving if trouble finding directions or if minor accidents are reported. Independent driving assessment is available to determine safety of driving.   If you are interested in the driving assessment, you can contact the following:  [employer] in [place]  [place] [phone] or [phone]    [place] refers to food and lifestyle choices that are based on the traditions of countries located on the [employer]. This way of eating has been shown to help prevent certain conditions and improve outcomes for people who have chronic diseases, like kidney disease and heart disease. What are tips for following this plan? Lifestyle  Cook and eat meals together with your family, when possible. Drink enough fluid to keep your urine clear or pale yellow. Be physically active every day. This includes: Aerobic exercise like running or swimming. Leisure activities like gardening, walking, or housework. Get 7-8 hours of sleep each night. If recommended by your health care provider, drink red wine in moderation. This means 1 glass a day for nonpregnant women and 2 glasses a day for  men. A glass of wine equals 5 oz (150 mL). Reading food labels  Check the serving size of packaged foods. For foods such as rice and pasta, the serving size refers to the amount of cooked product, not dry. Check the total fat in packaged foods. Avoid foods that have saturated fat or trans fats. Check the ingredients list for added sugars, such as  corn syrup. Shopping  At the grocery store, buy most of your food from the areas near the walls of the store. This includes: Fresh fruits and vegetables (produce). Grains, beans, nuts, and seeds. Some of these may be available in unpackaged forms or large amounts (in bulk). Fresh seafood. Poultry and eggs. Low-fat dairy products. Buy whole ingredients instead of prepackaged foods. Buy fresh fruits and vegetables in-season from local farmers markets. Buy frozen fruits and vegetables in resealable bags. If you do not have access to quality fresh seafood, buy precooked frozen shrimp or canned fish, such as tuna, salmon, or sardines. Buy small amounts of raw or cooked vegetables, salads, or olives from the deli or salad bar at your store. Stock your pantry so you always have certain foods on hand, such as olive oil, canned tuna, canned tomatoes, rice, pasta, and beans. Cooking  Cook foods with extra-virgin olive oil instead of using butter or other vegetable oils. Have meat as a side dish, and have vegetables or grains as your main dish. This means having meat in small portions or adding small amounts of meat to foods like pasta or stew. Use beans or vegetables instead of meat in common dishes like chili or lasagna. Experiment with different cooking methods. Try roasting or broiling vegetables instead of steaming or sauteing them. Add frozen vegetables to soups, stews, pasta, or rice. Add nuts or seeds for added healthy fat at each meal. You can add these to yogurt, salads, or vegetable dishes. Marinate fish or vegetables using olive oil, lemon juice, garlic, and fresh herbs. Meal planning  Plan to eat 1 vegetarian meal one day each week. Try to work up to 2 vegetarian meals, if possible. Eat seafood 2 or more times a week. Have healthy snacks readily available, such as: Vegetable sticks with hummus. Greek yogurt. Fruit and nut trail mix. Eat balanced meals throughout the week. This  includes: Fruit: 2-3 servings a day Vegetables: 4-5 servings a day Low-fat dairy: 2 servings a day Fish, poultry, or lean meat: 1 serving a day Beans and legumes: 2 or more servings a week Nuts and seeds: 1-2 servings a day Whole grains: 6-8 servings a day Extra-virgin olive oil: 3-4 servings a day Limit red meat and sweets to only a few servings a month What are my food choices? Mediterranean diet Recommended Grains: Whole-grain pasta. [place]river rice. Bulgar wheat. Polenta. Couscous. Whole-wheat bread. [place]. Vegetables: Artichokes. Beets. Broccoli. Cabbage. Carrots. Eggplant. Green beans. Chard. Kale. Spinach. Onions. Leeks. Peas. Squash. Tomatoes. Peppers. Radishes. Fruits: Apples. Apricots. Avocado. Berries. Bananas. Cherries. Dates. Figs. Grapes. Lemons. Melon. Oranges. Peaches. Plums. Pomegranate. Meats and other protein foods: Beans. Almonds. Sunflower seeds. Pine nuts. Peanuts. [place]. Salmon. Scallops. Shrimp. [place]. Tilapia. Clams. Oysters. Eggs. Dairy: Low-fat milk. Cheese. Greek yogurt. Beverages: Water. Red wine. Herbal tea. Fats and oils: Extra virgin olive oil. Avocado oil. Grape seed oil. Sweets and desserts: [place] yogurt with honey. Baked apples. Poached pears. Trail mix. Seasoning and other foods: Basil. Cilantro. Coriander. Cumin. Mint. Parsley. Sage. Rosemary. Tarragon. Garlic. Oregano. Thyme. Pepper. Balsalmic vinegar. Tahini. Hummus. Tomato sauce. Olives. Mushrooms. Limit  these Grains: Prepackaged pasta or rice dishes. Prepackaged cereal with added sugar. Vegetables: Deep fried potatoes (french fries). Fruits: Fruit canned in syrup. Meats and other protein foods: Beef. Pork. Lamb. Poultry with skin. Hot dogs. Berniece Salines. Dairy: Ice cream. Sour cream. Whole milk. Beverages: Juice. Sugar-sweetened soft drinks. Beer. Liquor and spirits. Fats and oils: Butter. Canola oil. Vegetable oil. Beef fat (tallow). Lard. Sweets and desserts: Cookies. Cakes. Pies. Candy. Seasoning  and other foods: Mayonnaise. Premade sauces and marinades. The items listed may not be a complete list. Talk with your dietitian about what dietary choices are right for you. Summary The Mediterranean diet includes both food and lifestyle choices. Eat a variety of fresh fruits and vegetables, beans, nuts, seeds, and whole grains. Limit the amount of red meat and sweets that you eat. Talk with your health care provider about whether it is safe for you to drink red wine in moderation. This means 1 glass a day for nonpregnant women and 2 glasses a day for men. A glass of wine equals 5 oz (150 mL). This information is not intended to replace advice given to you by your health care provider. Make sure you discuss any questions you have with your health care provider. Document Released: 03/30/2016 Document Revised: 05/02/2016 Document Reviewed: 03/30/2016  Elsevier Interactive Patient Education  2017 [place] provider has requested that you have labwork completed today. Please go to [facility] ([id number]) on the second floor of this building before leaving the office today. You do not need to check in. If you are not called within 15 minutes please check with the front desk.  We have sent a referral to [facility] for your MRI and they will call you directly to schedule your appointment. They are located at [place]. If you need to contact them directly please call [phone].

## 2022-05-11 NOTE — Progress Notes (Signed)
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 RH female  with  a history of hypertension, hyperlipidemia, situational depression, anxiety, hypothyroidism, insomnia, narcolepsy, CKD, vitamin D deficiency arthritis, and diagnosis of dementia per PCP on donepezil 10 mg daily seen today in follow up to discuss the MRI results, personally reviewed, remarkable for mild to moderate chronic microvascular ischemic changes and with parenchymal volume loss. Mild atrophy is noted as well. MoCA  on 03/27/22 was 16/30 consistent with mild dementia  likely  of mixed vascular and Alzheimer's etiology.     Follow up in  6 months. Patient is scheduled for Neuropsych testing for clarity of diagnosis and disease trajectory   Recommend good control of cardiovascular risk factors.   Continue donepezil 10 mg daily as per PCP Agree with [facility]dependent [facility]      Subjective:    This patient is accompanied in the office by  who supplements the history.  Previous records as well as any outside records available were reviewed prior to todays visit.    Any changes in memory since last visit? No significant changes, but she continues to have trouble understanding some instructions or statements and remembering recent conversations . She continues to perform her ADLs well except remembering appointments . She is in the list at [facility], but she still does not want to go there,decision to move is still pending.  Tolerating meds? Patient on donepezil 10 mg daily by PCP, tolerating well.    History 03/29/2022  The patient is accompanied by her daughter who supplements the history.   How long did patient have memory difficulties?  Daughter reports that she began showing signs of memory loss about 2 years ago, but has been worse over the last 3 or 4 months, when she has to write down appointments, names, and she has difficulty  remembering recent conversations.   Patient lives with:Patient lives alone repeats oneself? Endorsed, for the last 2 years.  Disoriented when walking into a room?  Patient denies   Leaving objects in unusual places?  Patient denies   Ambulates  with difficulty?   She walks about 1 mile a day, she is not as active as she used to be before the death of her husband.   Recent falls?  Patient denies   Any head injuries?  Patient denies   History of seizures?   Patient denies   Wandering behavior?  Patient denies   Patient drives?  Short distances  Any mood changes ?  Patient is dealing with grief/depression after her husband's death to [place] few years ago Hallucinations?  Patient denies   Paranoia?  Patient denies   Patient reports that sleeps well without vivid dreams, REM behavior or sleepwalking    History of sleep apnea?  Patient denies   Any hygiene concerns?  Patient denies   Independent of bathing and dressing?  Endorsed  Does the patient needs help with medications?  She is in charge of the medications Who is in charge of the finances?  Patient is in charge   Any changes in appetite?  Patient denies   Patient have trouble swallowing? Patient denies   Does the patient cook?  Patient denies   Any kitchen accidents such as leaving the stove on? Patient denies   Any headaches?  Patient denies   Double vision? Patient denies   Any focal numbness or  tingling?  Patient denies   Chronic back pain Patient denies   Unilateral weakness?  Patient denies   Any tremors?  Patient denies   Any history of anosmia?  Patient denies   Any incontinence of urine?  Endorsed, wears depends  Any bowel dysfunction?   Hemorrhoids, sometimes lose stools  History of heavy alcohol intake?  Patient denies   History of heavy tobacco use?  Patient denies   Family history of dementia?  Patient denies.    Recent labs 03/13/2022 remarkable for hemoglobin 11, hematocrit 32.4, HDL 33.2, sodium 132, 4, TSH  3.09.1.   MRI brain 04/2022  personally reviewed : No evidence of recent infarction, hemorrhage, or mass.Mild to moderate chronic microvascular ischemic changes. Parenchymal volume loss without lobar predilection or disproportionate hippocampal atrophy.   CURRENT MEDICATIONS:  Outpatient Encounter Medications as of 05/11/2022  Medication Sig   ALPRAZolam (XANAX) 0.5 MG tablet 1 tab by mouth twice per day as needed   amLODipine (NORVASC) 5 MG tablet Take 1 tablet (5 mg total) by mouth daily.   ARIPiprazole (ABILIFY) 5 MG tablet Take 1 tablet (5 mg total) by mouth daily.   donepezil (ARICEPT) 10 MG tablet Take 1 tablet (10 mg total) by mouth at bedtime.   hydrocortisone (ANUSOL-HC) 2.5 % rectal cream APPLY RECTALLY TWICE DAILY   labetalol (NORMODYNE) 300 MG tablet Take 1 tablet (300 mg total) by mouth 2 (two) times daily.   rosuvastatin (CRESTOR) 20 MG tablet Take 1 tablet (20 mg total) by mouth daily.   sertraline (ZOLOFT) 100 MG tablet 1 tab by mouth once daily   telmisartan (MICARDIS) 80 MG tablet Take 1 tablet (80 mg total) by mouth daily.   vitamin B-12 (CYANOCOBALAMIN) 1000 MCG tablet Take 1 tablet (1,000 mcg total) by mouth daily.   No facility-administered encounter medications on file as of 05/11/2022.       03/29/2018    4:28 PM  MMSE - Mini Mental State Exam  Orientation to time 5  Orientation to Place 5  Registration 3  Attention/ Calculation 4  Recall 1  Language- name 2 objects 2  Language- repeat 1  Language- follow 3 step command 3  Language- read & follow direction 1  Write a sentence 1  Copy design 1  Total score 27      03/30/2022   12:00 PM  Montreal Cognitive Assessment   Visuospatial/ Executive (0/5) 4  Naming (0/3) 3  Attention: Read list of digits (0/2) 1  Attention: Read list of letters (0/1) 1  Attention: Serial 7 subtraction starting at 100 (0/3) 0  Language: Repeat phrase (0/2) 0  Language : Fluency (0/1) 0  Abstraction (0/2) 2  Delayed Recall  (0/5) 1  Orientation (0/6) 4  Total 16  Adjusted Score (based on education) 16     Thank you for allowing [place] the opportunity to participate in the care of this nice patient. Please do not hesitate to contact us for any questions or concerns.   Total time spent on today's visit was 38 minutes dedicated to this patient today, preparing to see patient, examining the patient, ordering tests and/or medications and counseling the patient, documenting clinical information in the EHR or other health record, independently interpreting results and communicating results to the patient/family, discussing treatment and goals, answering patient's questions and coordinating care.  Cc:  Biagio Borg, MD  Sharene Butters 05/11/2022 7:20 AM

## 2022-05-19 ENCOUNTER — Ambulatory Visit (INDEPENDENT_AMBULATORY_CARE_PROVIDER_SITE_OTHER): Payer: PPO | Admitting: Physician Assistant

## 2022-05-19 ENCOUNTER — Encounter: Payer: Self-pay | Admitting: Physician Assistant

## 2022-05-19 VITALS — BP 118/74 | HR 62 | Ht 63.5 in | Wt 128.2 lb

## 2022-05-19 DIAGNOSIS — K623 Rectal prolapse: Secondary | ICD-10-CM

## 2022-05-19 NOTE — Progress Notes (Signed)
Chief Complaint: Hemorrhoids  HPI:    Alyssa Fernandez is an 81 year old female with past medical history of GERD, osteopenia and multiple others listed below, previously known to Dr. Sharlett Iles, who was referred to me by Biagio Borg, MD for a complaint of hemorrhoids.      10/08/2008 colonoscopy.  Cannot see report.    03/13/2022 CBC with hemoglobin down to 11.0 (12.7 on 12/12/2021)    03/22/2022 office visit with her PCP for follow-up of a grade 3 internal hemorrhoid.  Patient told to use Anusol HC as needed.    Today, patient presents to clinic accompanied by her daughter, who assists with history.  She explains that over the past couple of months she has had trouble where she feels like something is coming out of her rectum, at first it was small and she felt like it was a hemorrhoid.  She used hemorrhoid cream and it would go away, but now over the past 2 to 3 weeks it is larger and feels like almost the size of a lemon and when it comes out during the day it is typically when she is standing or straining and is "uncomfortable", then at night it tends to go back and by itself or if she is sitting.  Denies any change in bowel habits, no straining and is only had 1 child vaginally previously.    Denies fever, chills, weight loss or blood in her stool.  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 90 tablet 3   ARIPiprazole (ABILIFY) 5 MG tablet Take 1 tablet (5 mg total) by mouth daily. 90 tablet 3   donepezil (ARICEPT) 10 MG tablet Take 1 tablet (10 mg total) by mouth at bedtime. 90 tablet 3   hydrocortisone (ANUSOL-HC) 2.5 % rectal cream APPLY RECTALLY TWICE DAILY 30 g 1   labetalol (NORMODYNE) 300 MG tablet Take 1 tablet (300 mg total) by mouth 2 (two) times daily. 180 tablet 3   sertraline (ZOLOFT) 100 MG tablet 1 tab by mouth once daily 90 tablet 3   telmisartan (MICARDIS) 80 MG tablet Take 1 tablet (80 mg total) by mouth daily. 90 tablet 3   vitamin B-12 (CYANOCOBALAMIN) 1000 MCG tablet Take 1 tablet (1,000 mcg total) by mouth daily. 90 tablet 3   No current facility-administered medications for this visit.  Allergies as of 05/19/2022 - Review Complete 05/11/2022  Allergen Reaction Noted   Codeine Swelling 03/25/2007   Sulfonamide derivatives Swelling 03/25/2007   Zocor [simvastatin] Other (See Comments) 06/03/2015    Family History  Problem Relation Age of Onset   Stroke Mother     Social History   Socioeconomic History   Marital status: Widowed    Spouse name: Not on file   Number of children: 1   Years of education: Not on file   Highest education level: Not on file  Occupational History   Occupation: Retired  Tobacco Use   Smoking status: Never   Smokeless tobacco: Never  Vaping Use   Vaping Use: Never used  Substance and Sexual Activity   Alcohol use: No   Drug use: No   Sexual activity: Not Currently    Birth control/protection: Post-menopausal  Other Topics Concern   Not on file  Social History Narrative   Widowed since December 2021; married for over 42 years.    Lives in a 2-story home with cat.   Right handed   No caffeine   Social Determinants of Health   Financial Resource Strain: Low Risk  (10/26/2021)   Overall Financial Resource Strain (CARDIA)    Difficulty of Paying Living Expenses: Not hard at all  Food Insecurity: No Food Insecurity (10/26/2021)   Hunger Vital Sign    Worried About Running Out of Food in the Last Year: Never true    Ran Out of Food in the Last Year: Never true  Transportation Needs: No Transportation Needs (10/26/2021)   PRAPARE - Hydrologist (Medical): No    Lack of Transportation (Non-Medical): No  Physical Activity: Sufficiently Active (10/26/2021)   Exercise Vital Sign    Days of Exercise per Week: 5 days    Minutes of Exercise per Session: 30 min  Stress: No Stress Concern Present (10/26/2021)   [place]    Feeling of Stress : Not at all  Social Connections: Moderately Integrated (10/26/2021)   Social Connection and Isolation Panel [NHANES]    Frequency of Communication with Friends and Family: More than three times a week    Frequency of Social Gatherings with Friends and Family: Once a week    Attends Religious Services: More than 4 times per year    Active Member of [employer] or Organizations: No    Attends Music therapist: More than 4 times per year    Marital Status: Widowed  Intimate Partner Violence: Not At Risk (10/26/2021)   Humiliation, Afraid, Rape, and Kick questionnaire    Fear of Current or Ex-Partner: No    Emotionally Abused: No    Physically Abused: No    Sexually Abused: No    Review of Systems:    Constitutional: No weight loss, fever or chills Skin: No rash  Cardiovascular: No chest pain Respiratory: No SOB Gastrointestinal: See HPI and otherwise negative Genitourinary: No dysuria  Neurological: No headache, dizziness or syncope Musculoskeletal: No new muscle or joint  pain Hematologic: No bleeding  Psychiatric: No history of depression or anxiety   Physical Exam:  Vital signs: BP 118/74   Pulse 62   Ht 5' 3.5" (1.613 m)   Wt 128 lb 3.2 oz (58.2 kg)   BMI 22.35 kg/m    Constitutional:   Pleasant Elderly Caucasian female appears to be in NAD, Well developed, Well nourished, alert and cooperative Head:  Normocephalic  and atraumatic. Eyes:   PEERL, EOMI. No icterus. Conjunctiva pink. Ears:  Normal auditory acuity. Neck:  Supple Throat: Oral cavity and pharynx without inflammation, swelling or lesion.  Respiratory: Respirations even and unlabored. Lungs clear to auscultation bilaterally.   No wheezes, crackles, or rhonchi.  Cardiovascular: Normal S1, S2. No MRG. Regular rate and rhythm. No peripheral edema, cyanosis or pallor.  Gastrointestinal:  Soft, nondistended, nontender. No rebound or guarding. Normal bowel sounds. No appreciable masses or hepatomegaly. Rectal: External: Normal, decreased sphincter tone; internal: Decrease sphincter tone, no tenderness; Anoscopy: Visualized rectal tissue slightly erythematous with prolapse at straining Msk:  Symmetrical without gross deformities. Without edema, no deformity or joint abnormality.  Neurologic:  Alert and  oriented x4;  grossly normal neurologically.  Skin:   Dry and intact without significant lesions or rashes. Psychiatric: Demonstrates good judgement and reason without abnormal affect or behaviors.  RELEVANT LABS AND IMAGING: CBC    Component Value Date/Time   WBC 7.1 03/13/2022 1135   RBC 3.80 (L) 03/13/2022 1135   HGB 11.0 (L) 03/13/2022 1135   HCT 32.4 (L) 03/13/2022 1135   PLT 326.0 03/13/2022 1135   MCV 85.2 03/13/2022 1135   MCH 29.6 09/23/2020 1333   MCHC 34.0 03/13/2022 1135   RDW 13.3 03/13/2022 1135   LYMPHSABS 0.8 03/13/2022 1135   MONOABS 0.4 03/13/2022 1135   EOSABS 0.1 03/13/2022 1135   BASOSABS 0.0 03/13/2022 1135    CMP     Component Value Date/Time   NA 132 (L)  03/13/2022 1135   K 3.4 (L) 03/13/2022 1135   CL 95 (L) 03/13/2022 1135   CO2 29 03/13/2022 1135   GLUCOSE 109 (H) 03/13/2022 1135   BUN 14 03/13/2022 1135   CREATININE 1.16 03/13/2022 1135   CREATININE 0.81 06/21/2012 1217   CALCIUM 8.9 03/13/2022 1135   PROT 6.5 03/13/2022 1135   ALBUMIN 3.8 03/13/2022 1135   AST 14 03/13/2022 1135   ALT 11 03/13/2022 1135   ALKPHOS 53 03/13/2022 1135   BILITOT 0.5 03/13/2022 1135   GFRNONAA 58 (L) 09/23/2020 1333   GFRNONAA 72 06/21/2012 1217   GFRAA >60 04/16/2017 0535   GFRAA 83 06/21/2012 1217    Assessment: 1.  Rectal prolapse: Over the past couple of months, worsening, does reduce on its own, no rectal bleeding  Plan: 1.  Recommend the patient maintain soft solid bowel movements with no straining.  Discussed avoiding lifting heavy objects etc. 2.  Refer the patient to [facility] for further evaluation of her rectal prolapse and possible surgery.  They can let us know if they would require a flex sigmoidoscopy or other first, she is not having any rectal bleeding with this. 3.  Recommend she discontinue her Hydrocortisone.  She can use Vaseline or  other lube to help push the tissue back in if needed. 4.  Patient follow in clinic with [place] as needed.  Assigned to Dr. Bryan Lemma this morning.  Ellouise Newer, PA-C [facility] 05/19/2022, 9:09 AM  Cc: Biagio Borg, MD

## 2022-05-19 NOTE — Progress Notes (Signed)
Agree with the assessment and plan as outlined by Ellouise Newer, PA-C. Agree with plan for surgical evaluation. Pending surgical eval, may require colonoscopy to fully evaluate prior to any surgical intervention, vs intraoperative flex sig. Will await recommendations from surgery.   Eldor Conaway, DO, [facility]

## 2022-05-19 NOTE — Patient Instructions (Signed)
You have been referred for an appointment at [facility]. You should hear from their office within the next 2-3 weeks. If you do know, please call the number below. Make certain to bring a list of current medications, including any over the counter medications or vitamins. Also bring your co-pay if you have one as well as your insurance cards. [facility] is located at [address]. Should you need to reschedule your appointment, please contact them at [phone].  _______________________________________________________  If you are age 37 or older, your body mass index should be between 23-30. Your Body mass index is 22.35 kg/m. If this is out of the aforementioned range listed, please consider follow up with your Primary Care Provider.  If you are age 82 or younger, your body mass index should be between 19-25. Your Body mass index is 22.35 kg/m. If this is out of the aformentioned range listed, please consider follow up with your Primary Care Provider.   ________________________________________________________  The [facility] providers would like to encourage you to use [facility] to communicate with providers for non-urgent requests or questions.  Due to long hold times on the telephone, sending your provider a message by [facility] may be a faster and more efficient way to get a response.  Please allow 48 business hours for a response.  Please remember that this is for non-urgent requests.  _______________________________________________________  Due to recent changes in healthcare laws, you may see the results of your imaging and laboratory studies on MyChart before your provider has had a chance to review them.  We understand that in some cases there may be results that are confusing or concerning to you. Not all laboratory results come back in the same time frame and the provider may be waiting for multiple results in order to interpret others.  Please  give [place] 48 hours in order for your provider to thoroughly review all the results before contacting the office for clarification of your results.

## 2022-06-05 ENCOUNTER — Telehealth: Payer: Self-pay | Admitting: Physician Assistant

## 2022-06-05 NOTE — Telephone Encounter (Signed)
Advised Ms.Nance (patient daughter) that I contacted [facility] to follow up on appointment requested 05/19/22. Gabriel Cirri with [facility] indicates that they do not have patient in their system and has asked that I refax information over at which time she will contact patient with an appointment. Referral refaxed to [phone].

## 2022-06-05 NOTE — Telephone Encounter (Signed)
Patients daughter called to follow up on referral to [facility] also requested a call back once done so she knows.

## 2022-06-09 NOTE — Telephone Encounter (Signed)
Per [facility], patient is scheduled for an appointment with them on 06/21/22 at 930am.

## 2022-06-13 ENCOUNTER — Ambulatory Visit (INDEPENDENT_AMBULATORY_CARE_PROVIDER_SITE_OTHER): Payer: PPO

## 2022-06-13 DIAGNOSIS — Z23 Encounter for immunization: Secondary | ICD-10-CM

## 2022-06-13 NOTE — Progress Notes (Signed)
Pt received flu vaccine w/o any complications.  

## 2022-06-21 ENCOUNTER — Ambulatory Visit: Payer: Self-pay | Admitting: Surgery

## 2022-06-21 DIAGNOSIS — K623 Rectal prolapse: Secondary | ICD-10-CM | POA: Diagnosis not present

## 2022-06-21 DIAGNOSIS — G300 Alzheimer's disease with early onset: Secondary | ICD-10-CM | POA: Diagnosis not present

## 2022-06-21 DIAGNOSIS — R739 Hyperglycemia, unspecified: Secondary | ICD-10-CM

## 2022-06-21 DIAGNOSIS — K6289 Other specified diseases of anus and rectum: Secondary | ICD-10-CM | POA: Insufficient documentation

## 2022-06-21 DIAGNOSIS — F02A0 Dementia in other diseases classified elsewhere, mild, without behavioral disturbance, psychotic disturbance, mood disturbance, and anxiety: Secondary | ICD-10-CM | POA: Diagnosis not present

## 2022-06-21 HISTORY — DX: Other specified diseases of anus and rectum: K62.89

## 2022-06-26 NOTE — Telephone Encounter (Signed)
Left message for Alyssa Fernandez to call back.

## 2022-06-26 NOTE — Telephone Encounter (Signed)
Alyssa Fernandez called from [facility] to coordinate a procedure prior to 12\5\23, not sure if that needs to be discussed with Dr. Bryan Lemma first due to her age.

## 2022-06-27 NOTE — Telephone Encounter (Signed)
Caryl Pina from [facility] stated that pt needs to be scheduled for a colonoscopy prior to surgery on 12/6. Pt needs colonoscopy procedure on 07/25/22. It looks like you're not in the [facility] that day but Dr. Fuller Plan, Dr. [name], and Dr. Lyndel Safe are.

## 2022-06-28 NOTE — Telephone Encounter (Signed)
Please see notes below Chart reviewed. Dr. Lyndel Safe does not have any open availability on 07/25/2022 in the [facility] Please review and advise

## 2022-06-28 NOTE — Telephone Encounter (Signed)
Dr. [name] and Dr. Fuller Plan currently have a 10 am appt available on 07/25/22 in the [facility]. Please advise on scheduling. Thanks

## 2022-06-28 NOTE — Telephone Encounter (Signed)
I have one opening that [place] day at 10 am. The rest of the day is full. If Dr. [name] is unable to add on I'm happy to do it.

## 2022-06-29 NOTE — Telephone Encounter (Signed)
The patient can go on my schedule if I have availability that date and if she is a candidate for the [place].  - HD

## 2022-06-29 NOTE — Telephone Encounter (Signed)
Pt scheduled with Dr. [name] on 07/25/22 at 10:00 am. Pt's daughter verbalized understanding. Attempted to schedule previsit but pt's daughter stated that she already received prep and instructions from [place] [facility]. Called Ashley at [employer] and left voicemail.

## 2022-07-10 NOTE — Telephone Encounter (Signed)
Left message for Alyssa Fernandez to call back.

## 2022-07-12 NOTE — Telephone Encounter (Signed)
Left message for Hassan Rowan at [facility] regarding prep.

## 2022-07-12 NOTE — Telephone Encounter (Signed)
Hassan Rowan from [facility] stated the patient was given a prep but told that [facility] may want to use their prep and instructions. Left voicemail for pt's daughter to call back. Previsit appointment scheduled for 11/28 at 1:00 pm.

## 2022-07-14 NOTE — Patient Instructions (Addendum)
SURGICAL WAITING ROOM VISITATION Patients having surgery or a procedure may have no more than 2 support people in the waiting area - these visitors may rotate in the visitor waiting room.   Children under the age of 60 must have an adult with them who is not the patient. If the patient needs to stay at the hospital during part of their recovery, the visitor guidelines for inpatient rooms apply.  PRE-OP VISITATION  Pre-op nurse will coordinate an appropriate time for 1 support person to accompany the patient in pre-op.  This support person may not rotate.  This visitor will be contacted when the time is appropriate for the visitor to come back in the pre-op area.  Please refer to the [facility] website for the visitor guidelines for Inpatients (after your surgery is over and you are in a regular room).  You are not required to quarantine at this time prior to your surgery. However, you must do this: Hand Hygiene often Do NOT share personal items Notify your provider if you are in close contact with someone who has COVID or you develop fever 100.4 or greater, new onset of sneezing, cough, sore throat, shortness of breath or body aches.  If you test positive for Covid or have been in contact with anyone that has tested positive in the last 10 days please notify you surgeon.    Your procedure is scheduled on:  Wednesday  July 26, 2022  Report to [facility] Main Entrance: [address] entrance where the [employer] is available.   Report to admitting at: 06:15 AM  +++++Call this number if you have any questions or problems the morning of surgery [phone]  FOLLOW BOWEL PREP AND ANY ADDITIONAL PRE OP INSTRUCTIONS YOU RECEIVED FROM YOUR SURGEON'S OFFICE!!!  Dulcolax 5 mg - Take one (1) tablets with water at 07:00 am the day prior to surgery.  Miralax 255 g - Mix with 64 oz Gatorade/Powerade.  Starting at 10:00 am ,Drink this gradually over the next few hours (8 oz glass every  15-30 minutes) until gone the day prior to surgery You should finish in 4 hours-6 hours.    Neomycin 1000 mg - At 2 pm, 3 pm and 10 pm after Miralax  bowel prep the day prior to surgery.  Metronidazole (Flagyl) 1000 mg - At 2 pm, 3 pm and 10 pm after Miralax bowel prep the day prior to surgery.   Drink plenty of clear liquids all evening to avoid getting dehydrated.   Do not eat food after Midnight the night prior to your surgery/procedure.  After Midnight you may have the following liquids until  05:30 AM DAY OF SURGERY  Clear Liquid Diet Water Black Coffee (sugar ok, NO MILK/CREAM OR CREAMERS)  Tea (sugar ok, NO MILK/CREAM OR CREAMERS) regular and decaf                             Plain Jell-O  with no fruit (NO RED)                                           Fruit ices (not with fruit pulp, NO RED)  Popsicles (NO RED)                                                                  Juice: apple, WHITE grape, WHITE cranberry Sports drinks like Gatorade or Powerade (NO RED)        DRINK two (2) bottles of Pre-Surgery Clear Ensure starting at 6:00 pm the evening prior to your surgery to help prevent dehydration. Increase drinking clear fluids (see list above)                     The day of surgery:  Drink ONE (1) Pre-Surgery Clear Ensure at  05:30  AM the morning of surgery. Drink in one sitting. Do not sip.  This drink was given to you during your hospital pre-op appointment visit. Nothing else to drink after completing the Pre-Surgery Clear Ensure : No candy, chewing gum or throat lozenges.      Oral Hygiene is also important to reduce your risk of infection.        Remember - BRUSH YOUR TEETH THE MORNING OF SURGERY WITH YOUR REGULAR TOOTHPASTE  Take ONLY these medicines the morning of surgery with A SIP OF WATER: sertraline (Zoloft), aripiprazole (Abilify), labetalol, amlodipine and alprazolam (Xanax)                   You may not have any  metal on your body including hair pins, jewelry, and body piercing  Do not wear make-up, lotions, powders, perfumes or deodorant  Do not wear nail polish including gel and S&S, artificial / acrylic nails, or any other type of covering on natural nails including finger and toenails. If you have artificial nails, gel coating, etc., that needs to be removed by a nail salon, Please have this removed prior to surgery. Not doing so may mean that your surgery could be cancelled or delayed if the Surgeon or anesthesia staff feels like they are unable to monitor you safely.   Do not shave 48 hours prior to surgery to avoid nicks in your skin which may contribute to postoperative infections.   Contacts, Hearing Aids, dentures or bridgework may not be worn into surgery.   You may bring a small overnight bag with you on the day of surgery, only pack items that are not valuable .[facility] IS NOT RESPONSIBLE   FOR VALUABLES THAT ARE LOST OR STOLEN.   Do not bring your home medications to the hospital. The Pharmacy will dispense medications listed on your medication list to you during your admission in the Hospital.  Special Instructions: Bring a copy of your healthcare power of attorney and living will documents the day of surgery, if you wish to have them scanned into your [facility] Records- EPIC  Please read over the following fact sheets you were given: IF YOU HAVE QUESTIONS ABOUT YOUR PRE-OP INSTRUCTIONS, PLEASE CALL [phone]  ([place])   [facility] - Preparing for Surgery Before surgery, you can play an important role.  Because skin is not sterile, your skin needs to be as free of germs as possible.  You can reduce the number of germs on your skin by washing with CHG (chlorahexidine gluconate) soap before surgery.  CHG is an antiseptic cleaner which kills germs  and bonds with the skin to continue killing germs even after washing. Please DO NOT use if you have an allergy to CHG or  antibacterial soaps.  If your skin becomes reddened/irritated stop using the CHG and inform your nurse when you arrive at Short Stay. Do not shave (including legs and underarms) for at least 48 hours prior to the first CHG shower.  You may shave your face/neck.  Please follow these instructions carefully:  1.  Shower with CHG Soap the night before surgery and the  morning of surgery.  2.  If you choose to wash your hair, wash your hair first as usual with your normal  shampoo.  3.  After you shampoo, rinse your hair and body thoroughly to remove the shampoo.                             4.  Use CHG as you would any other liquid soap.  You can apply chg directly to the skin and wash.  Gently with a scrungie or clean washcloth.  5.  Apply the CHG Soap to your body ONLY FROM THE NECK DOWN.   Do not use on face/ open                           Wound or open sores. Avoid contact with eyes, ears mouth and genitals (private parts).                       Wash face,  Genitals (private parts) with your normal soap.             6.  Wash thoroughly, paying special attention to the area where your  surgery  will be performed.  7.  Thoroughly rinse your body with warm water from the neck down.  8.  DO NOT shower/wash with your normal soap after using and rinsing off the CHG Soap.            9.  Pat yourself dry with a clean towel.            10.  Wear clean pajamas.            11.  Place clean sheets on your bed the night of your first shower and do not  sleep with pets.  ON THE DAY OF SURGERY : Do not apply any lotions/deodorants the morning of surgery.  Please wear clean clothes to the hospital/surgery center.    FAILURE TO FOLLOW THESE INSTRUCTIONS MAY RESULT IN THE CANCELLATION OF YOUR SURGERY  PATIENT SIGNATURE_________________________________  NURSE SIGNATURE__________________________________  ________________________________________________________________________       Alyssa Fernandez    An incentive spirometer is a tool that can help keep your lungs clear and active. This tool measures how well you are filling your lungs with each breath. Taking long deep breaths may help reverse or decrease the chance of developing breathing (pulmonary) problems (especially infection) following: A long period of time when you are unable to move or be active. BEFORE THE PROCEDURE  If the spirometer includes an indicator to show your best effort, your nurse or respiratory therapist will set it to a desired goal. If possible, sit up straight or lean slightly forward. Try not to slouch. Hold the incentive spirometer in an upright position. INSTRUCTIONS FOR USE  Sit on the edge of your bed if possible, or sit up  as far as you can in bed or on a chair. Hold the incentive spirometer in an upright position. Breathe out normally. Place the mouthpiece in your mouth and seal your lips tightly around it. Breathe in slowly and as deeply as possible, raising the piston or the ball toward the top of the column. Hold your breath for 3-5 seconds or for as long as possible. Allow the piston or ball to fall to the bottom of the column. Remove the mouthpiece from your mouth and breathe out normally. Rest for a few seconds and repeat Steps 1 through 7 at least 10 times every 1-2 hours when you are awake. Take your time and take a few normal breaths between deep breaths. The spirometer may include an indicator to show your best effort. Use the indicator as a goal to work toward during each repetition. After each set of 10 deep breaths, practice coughing to be sure your lungs are clear. If you have an incision (the cut made at the time of surgery), support your incision when coughing by placing a pillow or rolled up towels firmly against it. Once you are able to get out of bed, walk around indoors and cough well. You may stop using the incentive spirometer when instructed by your caregiver.  RISKS AND  COMPLICATIONS Take your time so you do not get dizzy or light-headed. If you are in pain, you may need to take or ask for pain medication before doing incentive spirometry. It is harder to take a deep breath if you are having pain. AFTER USE Rest and breathe slowly and easily. It can be helpful to keep track of a log of your progress. Your caregiver can provide you with a simple table to help with this. If you are using the spirometer at home, follow these instructions: [place] IF:  You are having difficultly using the spirometer. You have trouble using the spirometer as often as instructed. Your pain medication is not giving enough relief while using the spirometer. You develop fever of 100.5 F (38.1 C) or higher.                                                                                                    SEEK IMMEDIATE MEDICAL CARE IF:  You cough up bloody sputum that had not been present before. You develop fever of 102 F (38.9 C) or greater. You develop worsening pain at or near the incision site. MAKE SURE YOU:  Understand these instructions. Will watch your condition. Will get help right away if you are not doing well or get worse. Document Released: 12/18/2006 Document Revised: 10/30/2011 Document Reviewed: 02/18/2007 [facility] Patient Information 2014 [place], [place].

## 2022-07-14 NOTE — Progress Notes (Addendum)
COVID Vaccine received:  _0  No _1  Yes Date of any COVID positive Test in last 90 days: none  PCP - Cathlean Cower, MD Cardiologist - None  Neurology- Sharene Butters, PA-C  Chest x-ray -  EKG -04-16-2017  will repeat at PST   Stress Test -  ECHO - 2013 Cardiac Cath -   PCR screen: _2  Ordered & Completed                      _3   No Order but Needs PROFEND                      _4   N/A for this surgery  Surgery Plan:  _5  Ambulatory                            _6  Outpatient in bed                            _7  Admit  Anesthesia:    _8  General  _9  Spinal                           _10   Choice _11   MAC  Bowel Prep - _12  No  _13   Yes __Dulcolax, Flagyl, Neomycin, Miralax, 3 Ensures  etc___  Pacemaker / ICD device _14  No _15  Yes        Device order form faxed _16  No    _17   Yes      Faxed to:  History of Sleep Apnea? _18  No _19  Yes   CPAP used?- _20  No _21  Yes    Does the patient monitor blood sugar? _22  No _23  Yes  _24  N/A  Blood Thinner / Instructions: none Aspirin Instructions:  ERAS Protocol Ordered: _25  No  _26  Yes PRE-SURGERY _27  ENSURE X 3 bottles Patient is to be NPO after: 05:30 am  Comments: Patient is scheduled to have a colonoscopy the day before surgery at [facility]. Patient's daughter, who is a Chartered certified accountant 3 on 6th floor, will be assisting Mrs. Tsou with all of her bowel preps.   Activity level: Patient can not climb a flight of stairs without difficulty; _28  No CP  _29  No SOB.  Patient can perform ADLs without assistance.   Anesthesia review: NTN, Dementia on Meds, CKD3, anemia, hx liver abscess, partial nephrectomy, remote hx narcolepsy.  Patient denies shortness of breath, fever, cough and chest pain at PAT appointment.  Patient verbalized understanding and agreement to the Pre-Surgical Instructions that were given to them at this PAT appointment. Patient was also educated of the need to review these PAT instructions again prior to his/her surgery.I reviewed the appropriate  phone numbers to call if they have any and questions or concerns.

## 2022-07-17 ENCOUNTER — Telehealth: Payer: Self-pay | Admitting: *Deleted

## 2022-07-17 ENCOUNTER — Other Ambulatory Visit: Payer: Self-pay

## 2022-07-17 ENCOUNTER — Encounter (HOSPITAL_COMMUNITY)
Admission: RE | Admit: 2022-07-17 | Discharge: 2022-07-17 | Disposition: A | Discharge status: Home / Self Care | Payer: PPO | Source: Ambulatory Visit | Attending: Surgery | Admitting: Surgery

## 2022-07-17 ENCOUNTER — Encounter (HOSPITAL_COMMUNITY): Payer: Self-pay

## 2022-07-17 VITALS — BP 161/90 | HR 54 | Resp 22 | Ht 63.5 in | Wt 126.0 lb

## 2022-07-17 DIAGNOSIS — R739 Hyperglycemia, unspecified: Secondary | ICD-10-CM | POA: Diagnosis not present

## 2022-07-17 DIAGNOSIS — I10 Essential (primary) hypertension: Secondary | ICD-10-CM | POA: Diagnosis not present

## 2022-07-17 DIAGNOSIS — Z01818 Encounter for other preprocedural examination: Secondary | ICD-10-CM | POA: Diagnosis not present

## 2022-07-17 HISTORY — DX: Personal history of urinary calculi: Z87.442

## 2022-07-17 LAB — CBC
HCT: 39.7 % (ref 36.0–46.0)
Hemoglobin: 12.7 g/dL (ref 12.0–15.0)
MCH: 28.8 pg (ref 26.0–34.0)
MCHC: 32 g/dL (ref 30.0–36.0)
MCV: 90 fL (ref 80.0–100.0)
Platelets: 216 10*3/uL (ref 150–400)
RBC: 4.41 MIL/uL (ref 3.87–5.11)
RDW: 13.5 % (ref 11.5–15.5)
WBC: 6.4 10*3/uL (ref 4.0–10.5)
nRBC: 0 % (ref 0.0–0.2)

## 2022-07-17 LAB — COMPREHENSIVE METABOLIC PANEL
ALT: 11 U/L (ref 0–44)
AST: 17 U/L (ref 15–41)
Albumin: 4.3 g/dL (ref 3.5–5.0)
Alkaline Phosphatase: 51 U/L (ref 38–126)
Anion gap: 10 (ref 5–15)
BUN: 17 mg/dL (ref 8–23)
CO2: 27 mmol/L (ref 22–32)
Calcium: 8.9 mg/dL (ref 8.9–10.3)
Chloride: 103 mmol/L (ref 98–111)
Creatinine, Ser: 1.34 mg/dL — ABNORMAL HIGH (ref 0.44–1.00)
GFR, Estimated: 39 mL/min — ABNORMAL LOW (ref >60)
Glucose, Bld: 96 mg/dL (ref 70–99)
Potassium: 3.5 mmol/L (ref 3.5–5.1)
Sodium: 140 mmol/L (ref 135–145)
Total Bilirubin: 0.7 mg/dL (ref 0.3–1.2)
Total Protein: 6.6 g/dL (ref 6.5–8.1)

## 2022-07-17 NOTE — Telephone Encounter (Signed)
Spoke with pt's daughter and let her know about previsit appointment tomorrow at 1 pm. Pt's daughter said they already had prep from [facility]. Let pt's daughter know she can bring prep she was given to previsit appointment to see if it is the right prep. Pt's daughter verbalized understanding and had no other concerns at end of call.

## 2022-07-17 NOTE — Telephone Encounter (Signed)
Alyssa Fernandez, this patient is a late add-on for [place], coming in tomorrow for PV, colonoscopy date 07/25/22

## 2022-07-18 ENCOUNTER — Ambulatory Visit (AMBULATORY_SURGERY_CENTER): Payer: Self-pay

## 2022-07-18 VITALS — Ht 63.0 in | Wt 127.0 lb

## 2022-07-18 DIAGNOSIS — Z800 Family history of malignant neoplasm of digestive organs: Secondary | ICD-10-CM

## 2022-07-18 NOTE — Telephone Encounter (Signed)
Alyssa Fernandez,  This pt is cleared for anesthetic care at [facility].  Thanks,  Osvaldo Angst

## 2022-07-18 NOTE — Progress Notes (Signed)

## 2022-07-19 LAB — HEMOGLOBIN A1C
Hgb A1c MFr Bld: 5.6 % (ref 4.8–5.6)
Mean Plasma Glucose: 114 mg/dL

## 2022-07-21 ENCOUNTER — Ambulatory Visit: Payer: PPO | Admitting: Internal Medicine

## 2022-07-22 ENCOUNTER — Encounter: Payer: Self-pay | Admitting: Gastroenterology

## 2022-07-25 ENCOUNTER — Encounter: Payer: Self-pay | Admitting: Gastroenterology

## 2022-07-25 ENCOUNTER — Ambulatory Visit (AMBULATORY_SURGERY_CENTER): Payer: PPO | Admitting: Gastroenterology

## 2022-07-25 VITALS — BP 183/79 | HR 57 | Temp 98.0°F | Resp 16

## 2022-07-25 DIAGNOSIS — K5730 Diverticulosis of large intestine without perforation or abscess without bleeding: Secondary | ICD-10-CM | POA: Diagnosis not present

## 2022-07-25 DIAGNOSIS — K623 Rectal prolapse: Secondary | ICD-10-CM | POA: Diagnosis not present

## 2022-07-25 DIAGNOSIS — K625 Hemorrhage of anus and rectum: Secondary | ICD-10-CM | POA: Diagnosis not present

## 2022-07-25 MED ORDER — SODIUM CHLORIDE 0.9 % IV SOLN: 500.0000 mL | Freq: Once | INTRAVENOUS | Status: DC

## 2022-07-25 NOTE — Op Note (Signed)
[place] Patient Name: Alyssa Fernandez Procedure Date: 07/25/2022 9:52 AM MRN: [id number] Endoscopist: [place]. Alyssa Fernandez , MD, [id number] Age: 81 Referring MD:  Date of Birth: October 24, 1940 Gender: Female Account #: [id number] Procedure:                Colonoscopy Indications:              Rectal bleeding with rectal prolapse.                           surgery planned for tomorrow - surgeon requested                            colonoscopy to rule out additional pathology such                            as neoplasia contributing to prolapse Medicines:                Monitored Anesthesia Care Procedure:                Pre-Anesthesia Assessment:                           - Prior to the procedure, a History and Physical                            was performed, and patient medications and                            allergies were reviewed. The patient's tolerance of                            previous anesthesia was also reviewed. The risks                            and benefits of the procedure and the sedation                            options and risks were discussed with the patient.                            All questions were answered, and informed consent                            was obtained. Prior Anticoagulants: The patient has                            taken no anticoagulant or antiplatelet agents. ASA                            Grade Assessment: III - A patient with severe                            systemic disease. After reviewing the risks and  benefits, the patient was deemed in satisfactory                            condition to undergo the procedure.                           After obtaining informed consent, the colonoscope                            was passed under direct vision. Throughout the                            procedure, the patient's blood pressure, pulse, and                            oxygen saturations were monitored  continuously. The                            CF [id number] #[id number] was introduced through the anus                            and advanced to the the terminal ileum, with                            identification of the appendiceal orifice and IC                            valve. The colonoscopy was performed without                            difficulty. The patient tolerated the procedure                            well. The quality of the bowel preparation was                            good. The terminal ileum, ileocecal valve,                            appendiceal orifice, and rectum were photographed. Scope In: 4:34:08 PM Scope Out: 4:44:01 PM Scope Withdrawal Time: 0 hours 5 minutes 47 seconds  Total Procedure Duration: 0 hours 9 minutes 53 seconds  Findings:                 The perianal exam findings include rectal prolapse                            (manaully reduced).                           Repeat examination of right colon under NBI                            performed.  There is no endoscopic evidence of polyps in the                            entire colon.                           A few small-mouthed diverticula were found in the                            sigmoid colon.                           A diffuse area of congested, erythematous and                            eroded (linear-pattern) mucosa was found in the                            distal rectum. (prolapse changes)                           Retroflexion in the rectum was not performed due to                            anatomy.                           The exam was otherwise without abnormality. Complications:            No immediate complications. Estimated Blood Loss:     Estimated blood loss: none. Impression:               - Rectal prolapse (manaully reduced) found on                            digital rectal exam.                           - Diverticulosis in the sigmoid colon.                            - Congested, erythematous and eroded                            (linear-pattern) mucosa in the distal rectum.                           - The examination was otherwise normal.                           - No specimens collected. Recommendation:           - Patient has a contact number available for                            emergencies. The signs and symptoms of potential  delayed complications were discussed with the                            patient. Return to normal activities tomorrow.                            Written discharge instructions were provided to the                            patient.                           - Clear liquid diet today. Then NPO after midnight                            for your surgery tomorrow.                           - Continue present medications.                           - Follow other pre-operative instructions from your                            surgeon. Daje Stark L. Alyssa Carrow, MD 07/25/2022 4:54:50 PM This report has been signed electronically.

## 2022-07-25 NOTE — Anesthesia Preprocedure Evaluation (Signed)
Anesthesia Evaluation  Patient identified by MRN, date of birth, ID band Patient awake    Reviewed: Allergy & Precautions, NPO status , Patient's Chart, lab work & pertinent test results, reviewed documented beta blocker date and time   History of Anesthesia Complications Negative for: history of anesthetic complications  Airway Mallampati: III  TM Distance: >3 FB Neck ROM: Full    Dental  (+) Dental Advisory Given   Pulmonary neg pulmonary ROS   Pulmonary exam normal        Cardiovascular hypertension, Pt. on home beta blockers and Pt. on medications Normal cardiovascular exam     Neuro/Psych  PSYCHIATRIC DISORDERS Anxiety Depression   Dementia  Neuromuscular disease    GI/Hepatic Neg liver ROS,GERD  Controlled,,  Endo/Other  Hypothyroidism    Renal/GU CRFRenal disease PKD      Musculoskeletal  (+) Arthritis ,    Abdominal   Peds  Hematology negative hematology ROS (+)   Anesthesia Other Findings   Reproductive/Obstetrics                             Anesthesia Physical Anesthesia Plan  ASA: 3  Anesthesia Plan: General   Post-op Pain Management: Tylenol PO (pre-op)*   Induction: Intravenous  PONV Risk Score and Plan: 3 and Treatment may vary due to age or medical condition, Ondansetron and TIVA  Airway Management Planned: Oral ETT  Additional Equipment: None  Intra-op Plan:   Post-operative Plan: Extubation in OR  Informed Consent: I have reviewed the patients History and Physical, chart, labs and discussed the procedure including the risks, benefits and alternatives for the proposed anesthesia with the patient or authorized representative who has indicated his/her understanding and acceptance.     Dental advisory given and Consent reviewed with POA  Plan Discussed with: CRNA and Anesthesiologist  Anesthesia Plan Comments:        Anesthesia Quick Evaluation

## 2022-07-25 NOTE — Progress Notes (Unsigned)
History and Physical:  This patient presents for endoscopic testing for: Encounter Diagnosis  Name Primary?   Rectal prolapse Yes    81 year old woman seen in consultation at our office 05/19/2022 (supervised by Dr. Bryan Lemma) for rectal prolapse.  She was then seen in consultation by Dr. Johney Maine who has the patient scheduled for surgical therapy tomorrow (rectosigmoid resection and rectopexy).  Surgical consult requested preoperative colonoscopic evaluation to rule out significant pathology contributing to this prolapse.  This patient was scheduled for procedure earlier today but had marked hypertension because she had not taken her morning blood pressure medicine.  She was sent home with her daughter and instructions to take her blood pressure medicine and return later today, which she did.  Blood pressure is now sufficiently improved to allow her to undergo sedation for this colonoscopy.  Patient is otherwise without complaints or active issues today.   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    OSTEOPENIA    Polycystic kidney disease    RENAL FAILURE    VITAMIN D DEFICIENCY      Past Surgical History: Past Surgical History:  Procedure Laterality Date   ABDOMINAL HYSTERECTOMY     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     bladder operation, but per sling procedure     BUNIONECTOMY     CERVICAL DISC SURGERY     fx rt ankle     left temporal bx artery-negative 2      PARTIAL NEPHRECTOMY Left    percutaneous drainage of liver abscess  05/06/2012    Allergies: Allergies  Allergen Reactions   Codeine Swelling   Sulfonamide Derivatives Swelling   Zocor [Simvastatin] Other (See Comments)    Cognitive  decrease    Outpatient Meds: Current Outpatient Medications  Medication Sig Dispense Refill   amLODipine (NORVASC) 5 MG tablet Take 1 tablet (5 mg total) by mouth daily. 90 tablet 3   ARIPiprazole (ABILIFY) 5 MG tablet Take 1 tablet (5 mg total) by mouth daily. 90 tablet 3   donepezil (ARICEPT) 10 MG tablet Take 1 tablet (10 mg total) by mouth at bedtime. 90 tablet 3   rosuvastatin (CRESTOR) 20 MG tablet Take 20 mg by mouth daily.     sertraline (ZOLOFT) 100 MG tablet 1 tab by mouth once daily 90 tablet 3   telmisartan (MICARDIS) 80 MG tablet Take 1 tablet (80 mg total) by mouth daily. 90 tablet 3   vitamin B-12 (CYANOCOBALAMIN) 1000 MCG tablet Take 1 tablet (1,000 mcg total) by mouth daily. 90 tablet 3   ALPRAZolam (XANAX) 0.5 MG tablet 1 tab by mouth twice per day as needed (Patient not taking: Reported on 07/18/2022) 60 tablet 5   bisacodyl (DULCOLAX) 5 MG EC tablet Take 5 mg by mouth once.     hydrocortisone (ANUSOL-HC) 2.5 % rectal cream APPLY RECTALLY TWICE DAILY (Patient not taking: Reported on 07/10/2022) 30 g 1   labetalol (NORMODYNE) 300 MG tablet Take 1 tablet (300 mg total) by mouth 2 (two) times daily. 180 tablet 3   metroNIDAZOLE (FLAGYL) 500 MG tablet Take 1,000 mg by mouth 3 (three) times daily.  neomycin (MYCIFRADIN) 500 MG tablet Take 1,000 mg by mouth 3 (three) times daily.     Current Facility-Administered Medications  Medication Dose Route Frequency Provider Last Rate Last Admin   0.9 %  sodium chloride infusion  500 mL Intravenous Once Danis, Estill Cotta III, MD          ___________________________________________________________________ Objective   Exam:  BP (!) 184/96   Pulse 60   Temp 98 F (36.7 C)   CV: regular , S1/S2 Resp: clear to auscultation bilaterally, normal RR and effort noted GI: soft, no tenderness, with active bowel sounds.   Assessment: Encounter Diagnosis  Name Primary?   Rectal prolapse Yes     Plan: Colonoscopy  The benefits  and risks of the planned procedure were described in detail with the patient or (when appropriate) their health care proxy.  Risks were outlined as including, but not limited to, bleeding, infection, perforation, adverse medication reaction leading to cardiac or pulmonary decompensation, pancreatitis (if ERCP).  The limitation of incomplete mucosal visualization was also discussed.  No guarantees or warranties were given.    The patient is appropriate for an endoscopic procedure in the ambulatory setting.   - Wilfrid Lund, MD

## 2022-07-25 NOTE — Progress Notes (Signed)
Pt's states no medical or surgical changes since previsit or office visit. 

## 2022-07-25 NOTE — Progress Notes (Unsigned)
Pt and daughter spoke with Dr. [name].  She will leave, take her blood pressure medicine ASAP when she gets home, NPO at 1:00 pm and will be back at 3:00 pm.  Understanding voiced

## 2022-07-25 NOTE — Patient Instructions (Addendum)
-   Patient has a contact number available for emergencies. The signs and symptoms of potential delayed complications were discussed with the patient. Return to normal activities tomorrow. Written discharge instructions were provided to the patient. - Clear liquid diet today. Then NPO after midnight for your surgery tomorrow. - Continue present medications. - Follow other pre-operative instructions from your surgeon.  Handout on diverticulosis given.   YOU HAD AN ENDOSCOPIC PROCEDURE TODAY AT [facility]:   Refer to the procedure report that was given to you for any specific questions about what was found during the examination.  If the procedure report does not answer your questions, please call your gastroenterologist to clarify.  If you requested that your care partner not be given the details of your procedure findings, then the procedure report has been included in a sealed envelope for you to review at your convenience later.  YOU SHOULD EXPECT: Some feelings of bloating in the abdomen. Passage of more gas than usual.  Walking can help get rid of the air that was put into your GI tract during the procedure and reduce the bloating. If you had a lower endoscopy (such as a colonoscopy or flexible sigmoidoscopy) you may notice spotting of blood in your stool or on the toilet paper. If you underwent a bowel prep for your procedure, you may not have a normal bowel movement for a few days.  Please Note:  You might notice some irritation and congestion in your nose or some drainage.  This is from the oxygen used during your procedure.  There is no need for concern and it should clear up in a day or so.  SYMPTOMS TO REPORT IMMEDIATELY:  Following lower endoscopy (colonoscopy or flexible sigmoidoscopy):  Excessive amounts of blood in the stool  Significant tenderness or worsening of abdominal pains  Swelling of the abdomen that is new, acute  Fever of 100F or higher  For urgent or  emergent issues, a gastroenterologist can be reached at any hour by calling [phone]. Do not use MyChart messaging for urgent concerns.    DIET:  We do recommend a small meal at first, but then you may proceed to your regular diet.  Drink plenty of fluids but you should avoid alcoholic beverages for 24 hours.  ACTIVITY:  You should plan to take it easy for the rest of today and you should NOT DRIVE or use heavy machinery until tomorrow (because of the sedation medicines used during the test).    FOLLOW UP: Our staff will call the number listed on your records the next business day following your procedure.  We will call around 7:15- 8:00 am to check on you and address any questions or concerns that you may have regarding the information given to you following your procedure. If we do not reach you, we will leave a message.     If any biopsies were taken you will be contacted by phone or by letter within the next 1-3 weeks.  Please call us at [phone] if you have not heard about the biopsies in 3 weeks.    SIGNATURES/CONFIDENTIALITY: You and/or your care partner have signed paperwork which will be entered into your electronic medical record.  These signatures attest to the fact that that the information above on your After Visit Summary has been reviewed and is understood.  Full responsibility of the confidentiality of this discharge information lies with you and/or your care-partner.

## 2022-07-25 NOTE — Progress Notes (Unsigned)
Patient seen and examined in pre-op  - asymptomatic HTN.  Did not take BP meds this AM.  Discussed with patient and daughter plan to go home, take BP meds, return later today and have procedure late afternoon if BP sufficiently improved.  - H. Danis

## 2022-07-25 NOTE — Progress Notes (Unsigned)
PT taken to PACU. Monitors in place. VSS. Report given to RN. 

## 2022-07-26 ENCOUNTER — Encounter (HOSPITAL_COMMUNITY): Payer: Self-pay | Admitting: Surgery

## 2022-07-26 ENCOUNTER — Encounter (HOSPITAL_COMMUNITY)
Admission: RE | Disposition: A | Discharge status: Skilled Nursing Facility | Payer: Self-pay | Source: Home / Self Care | Attending: Surgery

## 2022-07-26 ENCOUNTER — Other Ambulatory Visit: Payer: Self-pay

## 2022-07-26 ENCOUNTER — Inpatient Hospital Stay (HOSPITAL_COMMUNITY): Payer: PPO | Admitting: Anesthesiology

## 2022-07-26 ENCOUNTER — Inpatient Hospital Stay (HOSPITAL_COMMUNITY)
Admission: RE | Admit: 2022-07-26 | Discharge: 2022-07-31 | DRG: 330 | Disposition: A | Discharge status: Skilled Nursing Facility | Payer: PPO | Attending: Surgery | Admitting: Surgery

## 2022-07-26 ENCOUNTER — Telehealth: Payer: Self-pay | Admitting: *Deleted

## 2022-07-26 ENCOUNTER — Inpatient Hospital Stay (HOSPITAL_COMMUNITY): Payer: PPO | Admitting: Physician Assistant

## 2022-07-26 DIAGNOSIS — Z823 Family history of stroke: Secondary | ICD-10-CM

## 2022-07-26 DIAGNOSIS — F0153 Vascular dementia, unspecified severity, with mood disturbance: Secondary | ICD-10-CM | POA: Diagnosis not present

## 2022-07-26 DIAGNOSIS — M5136 Other intervertebral disc degeneration, lumbar region: Secondary | ICD-10-CM | POA: Diagnosis present

## 2022-07-26 DIAGNOSIS — Z89421 Acquired absence of other right toe(s): Secondary | ICD-10-CM

## 2022-07-26 DIAGNOSIS — Z87442 Personal history of urinary calculi: Secondary | ICD-10-CM

## 2022-07-26 DIAGNOSIS — K623 Rectal prolapse: Secondary | ICD-10-CM

## 2022-07-26 DIAGNOSIS — D62 Acute posthemorrhagic anemia: Secondary | ICD-10-CM | POA: Diagnosis not present

## 2022-07-26 DIAGNOSIS — Q612 Polycystic kidney, adult type: Secondary | ICD-10-CM | POA: Diagnosis not present

## 2022-07-26 DIAGNOSIS — D509 Iron deficiency anemia, unspecified: Secondary | ICD-10-CM | POA: Diagnosis present

## 2022-07-26 DIAGNOSIS — F0284 Dementia in other diseases classified elsewhere, unspecified severity, with anxiety: Secondary | ICD-10-CM | POA: Diagnosis not present

## 2022-07-26 DIAGNOSIS — N183 Chronic kidney disease, stage 3 unspecified: Secondary | ICD-10-CM

## 2022-07-26 DIAGNOSIS — M1990 Unspecified osteoarthritis, unspecified site: Secondary | ICD-10-CM | POA: Diagnosis present

## 2022-07-26 DIAGNOSIS — R2689 Other abnormalities of gait and mobility: Secondary | ICD-10-CM | POA: Diagnosis not present

## 2022-07-26 DIAGNOSIS — F32A Depression, unspecified: Secondary | ICD-10-CM | POA: Diagnosis not present

## 2022-07-26 DIAGNOSIS — Z882 Allergy status to sulfonamides status: Secondary | ICD-10-CM

## 2022-07-26 DIAGNOSIS — I700 Atherosclerosis of aorta: Secondary | ICD-10-CM | POA: Diagnosis not present

## 2022-07-26 DIAGNOSIS — R7989 Other specified abnormal findings of blood chemistry: Secondary | ICD-10-CM | POA: Diagnosis not present

## 2022-07-26 DIAGNOSIS — Z905 Acquired absence of kidney: Secondary | ICD-10-CM

## 2022-07-26 DIAGNOSIS — G629 Polyneuropathy, unspecified: Secondary | ICD-10-CM

## 2022-07-26 DIAGNOSIS — Q613 Polycystic kidney, unspecified: Secondary | ICD-10-CM | POA: Diagnosis not present

## 2022-07-26 DIAGNOSIS — I129 Hypertensive chronic kidney disease with stage 1 through stage 4 chronic kidney disease, or unspecified chronic kidney disease: Secondary | ICD-10-CM | POA: Diagnosis not present

## 2022-07-26 DIAGNOSIS — N189 Chronic kidney disease, unspecified: Secondary | ICD-10-CM | POA: Diagnosis not present

## 2022-07-26 DIAGNOSIS — Z885 Allergy status to narcotic agent status: Secondary | ICD-10-CM

## 2022-07-26 DIAGNOSIS — Q438 Other specified congenital malformations of intestine: Secondary | ICD-10-CM | POA: Diagnosis not present

## 2022-07-26 DIAGNOSIS — F329 Major depressive disorder, single episode, unspecified: Secondary | ICD-10-CM | POA: Diagnosis present

## 2022-07-26 DIAGNOSIS — F0150 Vascular dementia without behavioral disturbance: Secondary | ICD-10-CM | POA: Diagnosis not present

## 2022-07-26 DIAGNOSIS — G309 Alzheimer's disease, unspecified: Secondary | ICD-10-CM | POA: Diagnosis not present

## 2022-07-26 DIAGNOSIS — F411 Generalized anxiety disorder: Secondary | ICD-10-CM | POA: Diagnosis present

## 2022-07-26 DIAGNOSIS — M6281 Muscle weakness (generalized): Secondary | ICD-10-CM | POA: Diagnosis not present

## 2022-07-26 DIAGNOSIS — R159 Full incontinence of feces: Secondary | ICD-10-CM | POA: Diagnosis present

## 2022-07-26 DIAGNOSIS — E785 Hyperlipidemia, unspecified: Secondary | ICD-10-CM | POA: Diagnosis not present

## 2022-07-26 DIAGNOSIS — K660 Peritoneal adhesions (postprocedural) (postinfection): Secondary | ICD-10-CM | POA: Diagnosis present

## 2022-07-26 DIAGNOSIS — I10 Essential (primary) hypertension: Secondary | ICD-10-CM | POA: Diagnosis present

## 2022-07-26 DIAGNOSIS — R293 Abnormal posture: Secondary | ICD-10-CM | POA: Diagnosis not present

## 2022-07-26 DIAGNOSIS — E876 Hypokalemia: Secondary | ICD-10-CM | POA: Diagnosis present

## 2022-07-26 DIAGNOSIS — G8929 Other chronic pain: Secondary | ICD-10-CM | POA: Diagnosis not present

## 2022-07-26 DIAGNOSIS — F0283 Dementia in other diseases classified elsewhere, unspecified severity, with mood disturbance: Secondary | ICD-10-CM | POA: Diagnosis not present

## 2022-07-26 DIAGNOSIS — M810 Age-related osteoporosis without current pathological fracture: Secondary | ICD-10-CM | POA: Diagnosis present

## 2022-07-26 DIAGNOSIS — Z800 Family history of malignant neoplasm of digestive organs: Secondary | ICD-10-CM

## 2022-07-26 DIAGNOSIS — K5790 Diverticulosis of intestine, part unspecified, without perforation or abscess without bleeding: Secondary | ICD-10-CM | POA: Diagnosis present

## 2022-07-26 DIAGNOSIS — N182 Chronic kidney disease, stage 2 (mild): Secondary | ICD-10-CM

## 2022-07-26 DIAGNOSIS — Z888 Allergy status to other drugs, medicaments and biological substances status: Secondary | ICD-10-CM

## 2022-07-26 DIAGNOSIS — Z9049 Acquired absence of other specified parts of digestive tract: Secondary | ICD-10-CM

## 2022-07-26 DIAGNOSIS — K219 Gastro-esophageal reflux disease without esophagitis: Secondary | ICD-10-CM | POA: Diagnosis present

## 2022-07-26 DIAGNOSIS — F419 Anxiety disorder, unspecified: Secondary | ICD-10-CM | POA: Diagnosis present

## 2022-07-26 DIAGNOSIS — Z8249 Family history of ischemic heart disease and other diseases of the circulatory system: Secondary | ICD-10-CM

## 2022-07-26 DIAGNOSIS — F0154 Vascular dementia, unspecified severity, with anxiety: Secondary | ICD-10-CM | POA: Diagnosis not present

## 2022-07-26 DIAGNOSIS — F0280 Dementia in other diseases classified elsewhere without behavioral disturbance: Secondary | ICD-10-CM | POA: Diagnosis present

## 2022-07-26 DIAGNOSIS — G9009 Other idiopathic peripheral autonomic neuropathy: Secondary | ICD-10-CM | POA: Diagnosis not present

## 2022-07-26 DIAGNOSIS — Q430 Meckel's diverticulum (displaced) (hypertrophic): Secondary | ICD-10-CM | POA: Diagnosis not present

## 2022-07-26 DIAGNOSIS — Z90710 Acquired absence of both cervix and uterus: Secondary | ICD-10-CM

## 2022-07-26 DIAGNOSIS — R41841 Cognitive communication deficit: Secondary | ICD-10-CM | POA: Diagnosis not present

## 2022-07-26 HISTORY — PX: PROCTOSCOPY: SHX2266

## 2022-07-26 HISTORY — PX: RECTOPEXY: SHX5700

## 2022-07-26 HISTORY — DX: Rectal prolapse: K62.3

## 2022-07-26 SURGERY — COLECTOMY, SIGMOID, ROBOT-ASSISTED
Anesthesia: General

## 2022-07-26 MED ORDER — METOPROLOL TARTRATE 5 MG/5ML IV SOLN: 5.0000 mg | Freq: Four times a day (QID) | INTRAVENOUS | Status: DC | PRN

## 2022-07-26 MED ORDER — SIMETHICONE 80 MG PO CHEW: 40.0000 mg | CHEWABLE_TABLET | Freq: Four times a day (QID) | ORAL | Status: DC | PRN

## 2022-07-26 MED ORDER — ENSURE SURGERY PO LIQD
237.0000 mL | Freq: Two times a day (BID) | ORAL | Status: DC
  Administered 2022-07-27 – 2022-07-31 (×5): 237 mL via ORAL

## 2022-07-26 MED ORDER — LACTATED RINGERS IV SOLN: INTRAVENOUS | Status: DC

## 2022-07-26 MED ORDER — BUPIVACAINE-EPINEPHRINE (PF) 0.5% -1:200000 IJ SOLN
INTRAMUSCULAR | Status: AC
  Filled 2022-07-26: qty 30

## 2022-07-26 MED ORDER — SERTRALINE HCL 100 MG PO TABS
100.0000 mg | ORAL_TABLET | Freq: Every day | ORAL | Status: DC
  Administered 2022-07-27 – 2022-07-31 (×5): 100 mg via ORAL
  Filled 2022-07-26 (×5): qty 1

## 2022-07-26 MED ORDER — SODIUM CHLORIDE 0.9% FLUSH: 3.0000 mL | INTRAVENOUS | Status: DC | PRN

## 2022-07-26 MED ORDER — BISACODYL 5 MG PO TBEC
20.0000 mg | DELAYED_RELEASE_TABLET | Freq: Once | ORAL | Status: DC
  Filled 2022-07-26: qty 4

## 2022-07-26 MED ORDER — ALVIMOPAN 12 MG PO CAPS
12.0000 mg | ORAL_CAPSULE | ORAL | Status: AC
  Administered 2022-07-26: 12 mg via ORAL
  Filled 2022-07-26: qty 1

## 2022-07-26 MED ORDER — PROPOFOL 500 MG/50ML IV EMUL
INTRAVENOUS | Status: AC
  Filled 2022-07-26: qty 50

## 2022-07-26 MED ORDER — GABAPENTIN 300 MG PO CAPS
300.0000 mg | ORAL_CAPSULE | ORAL | Status: AC
  Administered 2022-07-26: 300 mg via ORAL
  Filled 2022-07-26: qty 1

## 2022-07-26 MED ORDER — PROPOFOL 10 MG/ML IV BOLUS
INTRAVENOUS | Status: DC | PRN
  Administered 2022-07-26: 80 mg via INTRAVENOUS

## 2022-07-26 MED ORDER — ONDANSETRON HCL 4 MG/2ML IJ SOLN
INTRAMUSCULAR | Status: DC | PRN
  Administered 2022-07-26: 4 mg via INTRAVENOUS

## 2022-07-26 MED ORDER — METHOCARBAMOL 1000 MG/10ML IJ SOLN
500.0000 mg | Freq: Four times a day (QID) | INTRAVENOUS | Status: DC | PRN
  Filled 2022-07-26: qty 5

## 2022-07-26 MED ORDER — LIDOCAINE HCL (PF) 2 % IJ SOLN
INTRAMUSCULAR | Status: AC
  Filled 2022-07-26: qty 5

## 2022-07-26 MED ORDER — OXYCODONE HCL 5 MG PO TABS: 5.0000 mg | ORAL_TABLET | Freq: Once | ORAL | Status: DC | PRN

## 2022-07-26 MED ORDER — SODIUM CHLORIDE 0.9 % IV SOLN: 250.0000 mL | INTRAVENOUS | Status: DC | PRN

## 2022-07-26 MED ORDER — DONEPEZIL HCL 10 MG PO TABS
10.0000 mg | ORAL_TABLET | Freq: Every day | ORAL | Status: DC
  Administered 2022-07-27 – 2022-07-30 (×4): 10 mg via ORAL
  Filled 2022-07-26 (×5): qty 1

## 2022-07-26 MED ORDER — ROCURONIUM BROMIDE 10 MG/ML (PF) SYRINGE
PREFILLED_SYRINGE | INTRAVENOUS | Status: AC
  Filled 2022-07-26: qty 10

## 2022-07-26 MED ORDER — SODIUM CHLORIDE (PF) 0.9 % IJ SOLN
INTRAMUSCULAR | Status: AC
  Filled 2022-07-26: qty 30

## 2022-07-26 MED ORDER — FENTANYL CITRATE PF 50 MCG/ML IJ SOSY: 25.0000 ug | PREFILLED_SYRINGE | INTRAMUSCULAR | Status: DC | PRN

## 2022-07-26 MED ORDER — PROPOFOL 500 MG/50ML IV EMUL
INTRAVENOUS | Status: DC | PRN
  Administered 2022-07-26: 125 ug/kg/min via INTRAVENOUS

## 2022-07-26 MED ORDER — ENOXAPARIN SODIUM 30 MG/0.3ML IJ SOSY
30.0000 mg | PREFILLED_SYRINGE | INTRAMUSCULAR | Status: DC
  Administered 2022-07-27 – 2022-07-29 (×3): 30 mg via SUBCUTANEOUS
  Filled 2022-07-26 (×3): qty 0.3

## 2022-07-26 MED ORDER — HYDROMORPHONE HCL 1 MG/ML IJ SOLN
0.5000 mg | INTRAMUSCULAR | Status: DC | PRN
  Administered 2022-07-26: 1 mg via INTRAVENOUS
  Filled 2022-07-26: qty 1

## 2022-07-26 MED ORDER — DIPHENHYDRAMINE HCL 12.5 MG/5ML PO ELIX: 12.5000 mg | ORAL_SOLUTION | Freq: Four times a day (QID) | ORAL | Status: DC | PRN

## 2022-07-26 MED ORDER — ACETAMINOPHEN 500 MG PO TABS
1000.0000 mg | ORAL_TABLET | ORAL | Status: AC
  Administered 2022-07-26: 1000 mg via ORAL
  Filled 2022-07-26: qty 2

## 2022-07-26 MED ORDER — SODIUM CHLORIDE 0.9 % IV SOLN
2.0000 g | INTRAVENOUS | Status: AC
  Administered 2022-07-26: 2 g via INTRAVENOUS
  Filled 2022-07-26: qty 2

## 2022-07-26 MED ORDER — ALPRAZOLAM 0.5 MG PO TABS: 0.5000 mg | ORAL_TABLET | Freq: Two times a day (BID) | ORAL | Status: DC | PRN

## 2022-07-26 MED ORDER — TRAMADOL HCL 50 MG PO TABS: 50.0000 mg | ORAL_TABLET | Freq: Four times a day (QID) | ORAL | 0 refills | Status: DC | PRN

## 2022-07-26 MED ORDER — SUGAMMADEX SODIUM 200 MG/2ML IV SOLN
INTRAVENOUS | Status: DC | PRN
  Administered 2022-07-26: 150 mg via INTRAVENOUS

## 2022-07-26 MED ORDER — SODIUM CHLORIDE (PF) 0.9 % IJ SOLN
INTRAMUSCULAR | Status: DC | PRN
  Administered 2022-07-26: 80 mL via INTRAMUSCULAR

## 2022-07-26 MED ORDER — LIP MEDEX EX OINT
TOPICAL_OINTMENT | Freq: Two times a day (BID) | CUTANEOUS | Status: DC
  Administered 2022-07-26: 75 via TOPICAL
  Administered 2022-07-27: 1 via TOPICAL
  Administered 2022-07-29 (×2): 75 via TOPICAL
  Filled 2022-07-26 (×2): qty 7

## 2022-07-26 MED ORDER — IRBESARTAN 150 MG PO TABS
150.0000 mg | ORAL_TABLET | Freq: Every day | ORAL | Status: DC
  Administered 2022-07-27 – 2022-07-31 (×5): 150 mg via ORAL
  Filled 2022-07-26 (×5): qty 1

## 2022-07-26 MED ORDER — VITAMIN B-12 1000 MCG PO TABS
1000.0000 ug | ORAL_TABLET | Freq: Every day | ORAL | Status: DC
  Administered 2022-07-27 – 2022-07-31 (×5): 1000 ug via ORAL
  Filled 2022-07-26 (×5): qty 1

## 2022-07-26 MED ORDER — METHOCARBAMOL 1000 MG/10ML IJ SOLN
1000.0000 mg | Freq: Four times a day (QID) | INTRAVENOUS | Status: DC | PRN
  Filled 2022-07-26: qty 10

## 2022-07-26 MED ORDER — BUPIVACAINE LIPOSOME 1.3 % IJ SUSP: 20.0000 mL | Freq: Once | INTRAMUSCULAR | Status: DC

## 2022-07-26 MED ORDER — ONDANSETRON HCL 4 MG/2ML IJ SOLN
4.0000 mg | Freq: Four times a day (QID) | INTRAMUSCULAR | Status: DC | PRN
  Administered 2022-07-28: 4 mg via INTRAVENOUS
  Filled 2022-07-26: qty 2

## 2022-07-26 MED ORDER — ACETAMINOPHEN 500 MG PO TABS
1000.0000 mg | ORAL_TABLET | Freq: Four times a day (QID) | ORAL | Status: DC
  Administered 2022-07-26 – 2022-07-31 (×15): 1000 mg via ORAL
  Filled 2022-07-26 (×15): qty 2

## 2022-07-26 MED ORDER — TRAMADOL HCL 50 MG PO TABS
50.0000 mg | ORAL_TABLET | Freq: Four times a day (QID) | ORAL | Status: DC | PRN
  Administered 2022-07-27 – 2022-07-29 (×4): 50 mg via ORAL
  Filled 2022-07-26 (×2): qty 1
  Filled 2022-07-26: qty 2
  Filled 2022-07-26 (×2): qty 1

## 2022-07-26 MED ORDER — NEOMYCIN SULFATE 500 MG PO TABS: 1000.0000 mg | ORAL_TABLET | ORAL | Status: DC

## 2022-07-26 MED ORDER — EPHEDRINE SULFATE-NACL 50-0.9 MG/10ML-% IV SOSY
PREFILLED_SYRINGE | INTRAVENOUS | Status: DC | PRN
  Administered 2022-07-26: 5 mg via INTRAVENOUS
  Administered 2022-07-26: 10 mg via INTRAVENOUS

## 2022-07-26 MED ORDER — ARIPIPRAZOLE 5 MG PO TABS
5.0000 mg | ORAL_TABLET | Freq: Every day | ORAL | Status: DC
  Administered 2022-07-27 – 2022-07-31 (×5): 5 mg via ORAL
  Filled 2022-07-26 (×5): qty 1

## 2022-07-26 MED ORDER — PHENYLEPHRINE 80 MCG/ML (10ML) SYRINGE FOR IV PUSH (FOR BLOOD PRESSURE SUPPORT)
PREFILLED_SYRINGE | INTRAVENOUS | Status: DC | PRN
  Administered 2022-07-26: 160 ug via INTRAVENOUS
  Administered 2022-07-26 (×2): 80 ug via INTRAVENOUS
  Administered 2022-07-26 (×2): 160 ug via INTRAVENOUS
  Administered 2022-07-26: 80 ug via INTRAVENOUS

## 2022-07-26 MED ORDER — DEXAMETHASONE SODIUM PHOSPHATE 10 MG/ML IJ SOLN
INTRAMUSCULAR | Status: DC | PRN
  Administered 2022-07-26: 5 mg via INTRAVENOUS

## 2022-07-26 MED ORDER — PROPOFOL 10 MG/ML IV BOLUS
INTRAVENOUS | Status: AC
  Filled 2022-07-26: qty 20

## 2022-07-26 MED ORDER — ALUM & MAG HYDROXIDE-SIMETH 200-200-20 MG/5ML PO SUSP: 30.0000 mL | Freq: Four times a day (QID) | ORAL | Status: DC | PRN

## 2022-07-26 MED ORDER — AMLODIPINE BESYLATE 5 MG PO TABS
5.0000 mg | ORAL_TABLET | Freq: Every day | ORAL | Status: DC
  Administered 2022-07-27 – 2022-07-31 (×5): 5 mg via ORAL
  Filled 2022-07-26 (×5): qty 1

## 2022-07-26 MED ORDER — LIDOCAINE HCL (CARDIAC) PF 100 MG/5ML IV SOSY
PREFILLED_SYRINGE | INTRAVENOUS | Status: DC | PRN
  Administered 2022-07-26: 100 mg via INTRAVENOUS

## 2022-07-26 MED ORDER — ENSURE PRE-SURGERY PO LIQD
296.0000 mL | Freq: Once | ORAL | Status: AC
  Administered 2022-07-26: 296 mL via ORAL
  Filled 2022-07-26: qty 296

## 2022-07-26 MED ORDER — ENSURE PRE-SURGERY PO LIQD
592.0000 mL | Freq: Once | ORAL | Status: DC
  Filled 2022-07-26: qty 592

## 2022-07-26 MED ORDER — 0.9 % SODIUM CHLORIDE (POUR BTL) OPTIME
TOPICAL | Status: DC | PRN
  Administered 2022-07-26: 2000 mL

## 2022-07-26 MED ORDER — METHOCARBAMOL 500 MG PO TABS: 500.0000 mg | ORAL_TABLET | Freq: Four times a day (QID) | ORAL | Status: DC | PRN

## 2022-07-26 MED ORDER — ROSUVASTATIN CALCIUM 20 MG PO TABS
20.0000 mg | ORAL_TABLET | Freq: Every day | ORAL | Status: DC
  Administered 2022-07-27 – 2022-07-31 (×5): 20 mg via ORAL
  Filled 2022-07-26 (×5): qty 1

## 2022-07-26 MED ORDER — STERILE WATER FOR INJECTION IJ SOLN
INTRAMUSCULAR | Status: DC | PRN
  Administered 2022-07-26: 2 mL via INTRAVENOUS

## 2022-07-26 MED ORDER — ORAL CARE MOUTH RINSE: 15.0000 mL | Freq: Once | OROMUCOSAL | Status: AC

## 2022-07-26 MED ORDER — BUPIVACAINE LIPOSOME 1.3 % IJ SUSP
INTRAMUSCULAR | Status: AC
  Filled 2022-07-26: qty 20

## 2022-07-26 MED ORDER — PROCHLORPERAZINE EDISYLATE 10 MG/2ML IJ SOLN: 5.0000 mg | Freq: Four times a day (QID) | INTRAMUSCULAR | Status: DC | PRN

## 2022-07-26 MED ORDER — OXYCODONE HCL 5 MG/5ML PO SOLN: 5.0000 mg | Freq: Once | ORAL | Status: DC | PRN

## 2022-07-26 MED ORDER — ENOXAPARIN SODIUM 40 MG/0.4ML IJ SOSY
40.0000 mg | PREFILLED_SYRINGE | Freq: Once | INTRAMUSCULAR | Status: AC
  Administered 2022-07-26: 40 mg via SUBCUTANEOUS
  Filled 2022-07-26: qty 0.4

## 2022-07-26 MED ORDER — PROPOFOL 1000 MG/100ML IV EMUL
INTRAVENOUS | Status: AC
  Filled 2022-07-26: qty 100

## 2022-07-26 MED ORDER — LACTATED RINGERS IR SOLN
Status: DC | PRN
  Administered 2022-07-26: 2000 mL

## 2022-07-26 MED ORDER — DIPHENHYDRAMINE HCL 50 MG/ML IJ SOLN: 12.5000 mg | Freq: Four times a day (QID) | INTRAMUSCULAR | Status: DC | PRN

## 2022-07-26 MED ORDER — SODIUM CHLORIDE 0.9 % IV SOLN
2.0000 g | Freq: Two times a day (BID) | INTRAVENOUS | Status: AC
  Administered 2022-07-26: 2 g via INTRAVENOUS
  Filled 2022-07-26: qty 2

## 2022-07-26 MED ORDER — METHOCARBAMOL 500 MG PO TABS
500.0000 mg | ORAL_TABLET | Freq: Four times a day (QID) | ORAL | Status: DC | PRN
  Administered 2022-07-27: 500 mg via ORAL
  Filled 2022-07-26: qty 1

## 2022-07-26 MED ORDER — POLYETHYLENE GLYCOL 3350 17 GM/SCOOP PO POWD
1.0000 | Freq: Once | ORAL | Status: DC
  Filled 2022-07-26: qty 255

## 2022-07-26 MED ORDER — PROCHLORPERAZINE MALEATE 10 MG PO TABS
10.0000 mg | ORAL_TABLET | Freq: Four times a day (QID) | ORAL | Status: DC | PRN
  Filled 2022-07-26: qty 1

## 2022-07-26 MED ORDER — ROCURONIUM BROMIDE 10 MG/ML (PF) SYRINGE
PREFILLED_SYRINGE | INTRAVENOUS | Status: DC | PRN
  Administered 2022-07-26: 50 mg via INTRAVENOUS
  Administered 2022-07-26: 20 mg via INTRAVENOUS

## 2022-07-26 MED ORDER — SODIUM CHLORIDE 0.9% FLUSH
3.0000 mL | Freq: Two times a day (BID) | INTRAVENOUS | Status: DC
  Administered 2022-07-26 – 2022-07-30 (×7): 3 mL via INTRAVENOUS

## 2022-07-26 MED ORDER — CALCIUM POLYCARBOPHIL 625 MG PO TABS
625.0000 mg | ORAL_TABLET | Freq: Two times a day (BID) | ORAL | Status: DC
  Administered 2022-07-26 – 2022-07-31 (×10): 625 mg via ORAL
  Filled 2022-07-26 (×10): qty 1

## 2022-07-26 MED ORDER — LABETALOL HCL 100 MG PO TABS
100.0000 mg | ORAL_TABLET | Freq: Three times a day (TID) | ORAL | Status: DC
  Administered 2022-07-26 – 2022-07-31 (×14): 100 mg via ORAL
  Filled 2022-07-26 (×14): qty 1

## 2022-07-26 MED ORDER — METRONIDAZOLE 500 MG PO TABS: 1000.0000 mg | ORAL_TABLET | ORAL | Status: DC

## 2022-07-26 MED ORDER — ALVIMOPAN 12 MG PO CAPS
12.0000 mg | ORAL_CAPSULE | Freq: Two times a day (BID) | ORAL | Status: DC
  Administered 2022-07-27 – 2022-07-28 (×3): 12 mg via ORAL
  Filled 2022-07-26 (×5): qty 1

## 2022-07-26 MED ORDER — ONDANSETRON HCL 4 MG/2ML IJ SOLN: 4.0000 mg | Freq: Once | INTRAMUSCULAR | Status: DC | PRN

## 2022-07-26 MED ORDER — HYDRALAZINE HCL 20 MG/ML IJ SOLN: 10.0000 mg | INTRAMUSCULAR | Status: DC | PRN

## 2022-07-26 MED ORDER — CHLORHEXIDINE GLUCONATE 0.12 % MT SOLN
15.0000 mL | Freq: Once | OROMUCOSAL | Status: AC
  Administered 2022-07-26: 15 mL via OROMUCOSAL

## 2022-07-26 MED ORDER — FENTANYL CITRATE (PF) 250 MCG/5ML IJ SOLN
INTRAMUSCULAR | Status: AC
  Filled 2022-07-26: qty 5

## 2022-07-26 MED ORDER — MAGIC MOUTHWASH
15.0000 mL | Freq: Four times a day (QID) | ORAL | Status: DC | PRN
  Filled 2022-07-26: qty 15

## 2022-07-26 MED ORDER — MELATONIN 3 MG PO TABS
3.0000 mg | ORAL_TABLET | Freq: Every evening | ORAL | Status: DC | PRN
  Administered 2022-07-30: 3 mg via ORAL
  Filled 2022-07-26: qty 1

## 2022-07-26 MED ORDER — FENTANYL CITRATE (PF) 250 MCG/5ML IJ SOLN
INTRAMUSCULAR | Status: DC | PRN
  Administered 2022-07-26 (×4): 50 ug via INTRAVENOUS

## 2022-07-26 MED ORDER — ONDANSETRON HCL 4 MG PO TABS: 4.0000 mg | ORAL_TABLET | Freq: Four times a day (QID) | ORAL | Status: DC | PRN

## 2022-07-26 SURGICAL SUPPLY — 117 items
APPLIER CLIP 5 13 M/L LIGAMAX5 (MISCELLANEOUS)
APPLIER CLIP ROT 10 11.4 M/L (STAPLE)
BAG COUNTER SPONGE SURGICOUNT (BAG) ×1 IMPLANT
BLADE EXTENDED COATED 6.5IN (ELECTRODE) IMPLANT
CANNULA REDUC XI 12-8 STAPL (CANNULA)
CANNULA REDUCER 12-8 DVNC XI (CANNULA) IMPLANT
CELLS DAT CNTRL 66122 CELL SVR (MISCELLANEOUS) IMPLANT
CHLORAPREP W/TINT 26 (MISCELLANEOUS) IMPLANT
CLIP APPLIE 5 13 M/L LIGAMAX5 (MISCELLANEOUS) IMPLANT
CLIP APPLIE ROT 10 11.4 M/L (STAPLE) IMPLANT
COVER SURGICAL LIGHT HANDLE (MISCELLANEOUS) ×2 IMPLANT
COVER TIP SHEARS 8 DVNC (MISCELLANEOUS) ×1 IMPLANT
COVER TIP SHEARS 8MM DA VINCI (MISCELLANEOUS) ×1
DEVICE TROCAR PUNCTURE CLOSURE (ENDOMECHANICALS) IMPLANT
DRAIN CHANNEL 19F RND (DRAIN) IMPLANT
DRAIN CHANNEL RND F F (WOUND CARE) IMPLANT
DRAPE ARM DVNC X/XI (DISPOSABLE) ×4 IMPLANT
DRAPE COLUMN DVNC XI (DISPOSABLE) ×1 IMPLANT
DRAPE DA VINCI XI ARM (DISPOSABLE) ×4
DRAPE DA VINCI XI COLUMN (DISPOSABLE) ×1
DRAPE SURG IRRIG POUCH 19X23 (DRAPES) ×1 IMPLANT
DRSG OPSITE POSTOP 4X10 (GAUZE/BANDAGES/DRESSINGS) IMPLANT
DRSG OPSITE POSTOP 4X6 (GAUZE/BANDAGES/DRESSINGS) IMPLANT
DRSG OPSITE POSTOP 4X8 (GAUZE/BANDAGES/DRESSINGS) IMPLANT
DRSG TEGADERM 2-3/8X2-3/4 SM (GAUZE/BANDAGES/DRESSINGS) ×5 IMPLANT
DRSG TEGADERM 4X4.75 (GAUZE/BANDAGES/DRESSINGS) IMPLANT
ELECT PENCIL ROCKER SW 15FT (MISCELLANEOUS) ×1 IMPLANT
ELECT REM PT RETURN 15FT ADLT (MISCELLANEOUS) ×1 IMPLANT
ENDOLOOP SUT PDS II  0 18 (SUTURE)
ENDOLOOP SUT PDS II 0 18 (SUTURE) IMPLANT
EVACUATOR SILICONE 100CC (DRAIN) IMPLANT
GAUZE SPONGE 2X2 8PLY STRL LF (GAUZE/BANDAGES/DRESSINGS) ×1 IMPLANT
GLOVE ECLIPSE 8.0 STRL XLNG CF (GLOVE) ×3 IMPLANT
GLOVE INDICATOR 8.0 STRL GRN (GLOVE) ×3 IMPLANT
GOWN SRG XL LVL 4 BRTHBL STRL (GOWNS) ×1 IMPLANT
GOWN STRL NON-REIN XL LVL4 (GOWNS)
GOWN STRL REUS W/ TWL XL LVL3 (GOWN DISPOSABLE) ×4 IMPLANT
GOWN STRL REUS W/TWL XL LVL3 (GOWN DISPOSABLE) ×4
GRASPER SUT TROCAR 14GX15 (MISCELLANEOUS) IMPLANT
HOLDER FOLEY CATH W/STRAP (MISCELLANEOUS) ×1 IMPLANT
IRRIG SUCT STRYKERFLOW 2 WTIP (MISCELLANEOUS) ×1
IRRIGATION SUCT STRKRFLW 2 WTP (MISCELLANEOUS) ×1 IMPLANT
KIT PROCEDURE DA VINCI SI (MISCELLANEOUS) ×1
KIT PROCEDURE DVNC SI (MISCELLANEOUS) IMPLANT
KIT SIGMOIDOSCOPE (SET/KITS/TRAYS/PACK) IMPLANT
KIT TURNOVER KIT A (KITS) IMPLANT
NDL INSUFFLATION 14GA 120MM (NEEDLE) ×1 IMPLANT
NEEDLE INSUFFLATION 14GA 120MM (NEEDLE) ×1 IMPLANT
PACK CARDIOVASCULAR III (CUSTOM PROCEDURE TRAY) ×1 IMPLANT
PACK COLON (CUSTOM PROCEDURE TRAY) ×1 IMPLANT
PAD POSITIONING PINK XL (MISCELLANEOUS) ×1 IMPLANT
PROTECTOR NERVE ULNAR (MISCELLANEOUS) ×2 IMPLANT
RELOAD STAPLE 45 3.5 BLU DVNC (STAPLE) IMPLANT
RELOAD STAPLE 45 4.3 GRN DVNC (STAPLE) IMPLANT
RELOAD STAPLE 60 3.5 BLU DVNC (STAPLE) IMPLANT
RELOAD STAPLE 60 4.3 GRN DVNC (STAPLE) IMPLANT
RELOAD STAPLER 3.5X45 BLU DVNC (STAPLE) IMPLANT
RELOAD STAPLER 3.5X60 BLU DVNC (STAPLE) IMPLANT
RELOAD STAPLER 4.3X45 GRN DVNC (STAPLE) IMPLANT
RELOAD STAPLER 4.3X60 GRN DVNC (STAPLE) ×1 IMPLANT
RETRACTOR WND ALEXIS 18 MED (MISCELLANEOUS) IMPLANT
RTRCTR WOUND ALEXIS 18CM MED (MISCELLANEOUS)
SCISSORS LAP 5X35 DISP (ENDOMECHANICALS) ×1 IMPLANT
SEAL CANN UNIV 5-8 DVNC XI (MISCELLANEOUS) ×3 IMPLANT
SEAL XI 5MM-8MM UNIVERSAL (MISCELLANEOUS) ×4
SEALER VESSEL DA VINCI XI (MISCELLANEOUS) ×1
SEALER VESSEL EXT DVNC XI (MISCELLANEOUS) ×1 IMPLANT
SOLUTION ELECTROLUBE (MISCELLANEOUS) ×1 IMPLANT
SPIKE FLUID TRANSFER (MISCELLANEOUS) ×1 IMPLANT
STAPLER 45 DA VINCI SURE FORM (STAPLE)
STAPLER 45 SUREFORM DVNC (STAPLE) IMPLANT
STAPLER 60 DA VINCI SURE FORM (STAPLE) ×1
STAPLER 60 SUREFORM DVNC (STAPLE) IMPLANT
STAPLER CANNULA SEAL DVNC XI (STAPLE) ×1 IMPLANT
STAPLER CANNULA SEAL XI (STAPLE) ×1
STAPLER ECHELON POWER CIR 29 (STAPLE) IMPLANT
STAPLER ECHELON POWER CIR 31 (STAPLE) IMPLANT
STAPLER RELOAD 3.5X45 BLU DVNC (STAPLE)
STAPLER RELOAD 3.5X45 BLUE (STAPLE)
STAPLER RELOAD 3.5X60 BLU DVNC (STAPLE)
STAPLER RELOAD 3.5X60 BLUE (STAPLE)
STAPLER RELOAD 4.3X45 GREEN (STAPLE)
STAPLER RELOAD 4.3X45 GRN DVNC (STAPLE)
STAPLER RELOAD 4.3X60 GREEN (STAPLE) ×1
STAPLER RELOAD 4.3X60 GRN DVNC (STAPLE) ×1
STOPCOCK 4 WAY LG BORE MALE ST (IV SETS) ×2 IMPLANT
SURGILUBE 2OZ TUBE FLIPTOP (MISCELLANEOUS) IMPLANT
SUT MNCRL AB 4-0 PS2 18 (SUTURE) ×1 IMPLANT
SUT PDS AB 1 CT1 27 (SUTURE) ×2 IMPLANT
SUT PROLENE 0 CT 2 (SUTURE) IMPLANT
SUT PROLENE 2 0 KS (SUTURE) IMPLANT
SUT PROLENE 2 0 SH DA (SUTURE) IMPLANT
SUT SILK 2 0 (SUTURE)
SUT SILK 2 0 SH CR/8 (SUTURE) IMPLANT
SUT SILK 2-0 18XBRD TIE 12 (SUTURE) IMPLANT
SUT SILK 3 0 (SUTURE)
SUT SILK 3 0 SH CR/8 (SUTURE) ×1 IMPLANT
SUT SILK 3-0 18XBRD TIE 12 (SUTURE) IMPLANT
SUT V-LOC BARB 180 2/0GR6 GS22 (SUTURE)
SUT VIC AB 3-0 SH 18 (SUTURE) IMPLANT
SUT VIC AB 3-0 SH 27 (SUTURE)
SUT VIC AB 3-0 SH 27XBRD (SUTURE) IMPLANT
SUT VICRYL 0 UR6 27IN ABS (SUTURE) ×1 IMPLANT
SUT VLOC 180 0 6IN GS21 (SUTURE) IMPLANT
SUT VLOC 180 0 9IN  GS21 (SUTURE) ×1
SUT VLOC 180 0 9IN GS21 (SUTURE) IMPLANT
SUTURE V-LC BRB 180 2/0GR6GS22 (SUTURE) IMPLANT
SYR 20ML ECCENTRIC (SYRINGE) ×1 IMPLANT
SYS LAPSCP GELPORT 120MM (MISCELLANEOUS)
SYS WOUND ALEXIS 18CM MED (MISCELLANEOUS) ×1
SYSTEM LAPSCP GELPORT 120MM (MISCELLANEOUS) IMPLANT
SYSTEM WOUND ALEXIS 18CM MED (MISCELLANEOUS) ×1 IMPLANT
TOWEL OR NON WOVEN STRL DISP B (DISPOSABLE) ×1 IMPLANT
TRAY FOLEY MTR SLVR 16FR STAT (SET/KITS/TRAYS/PACK) ×1 IMPLANT
TROCAR ADV FIXATION 5X100MM (TROCAR) ×1 IMPLANT
TUBING CONNECTING 10 (TUBING) ×2 IMPLANT
TUBING INSUFFLATION 10FT LAP (TUBING) ×1 IMPLANT

## 2022-07-26 NOTE — Interval H&P Note (Signed)
History and Physical Interval Note:  07/26/2022 7:45 AM  Alyssa Fernandez  has presented today for surgery, with the diagnosis of RECTAL PROLAPSE.  The various methods of treatment have been discussed with the patient and family. After consideration of risks, benefits and other options for treatment, the patient has consented to  Procedure(s): ROBOTIC RESECTION OF RECTOSIGMOID (N/A) RECTOPEXY (N/A) RIGID PROCTOSCOPY (N/A) as a surgical intervention.  The patient's history has been reviewed, patient examined, no change in status, stable for surgery.  I have reviewed the patient's chart and labs.  Questions were answered to the patient's satisfaction.    I have re-reviewed the the patient's records, history, medications, and allergies.  I have re-examined the patient.  I again discussed intraoperative plans and goals of post-operative recovery.  The patient agrees to proceed.  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   UTI (urinary tract infection) 03/22/2022   Internal hemorrhoids 03/22/2022   Anxiety and depression 12/14/2021   Epigastric pain 12/14/2021   Grief 09/27/2020   Penetrating forearm wound, left, sequela 05/26/2020   Right wrist pain 11/09/2019   Wheezing 05/08/2019   Osteoporosis 01/30/2018   Atherosclerosis of aorta ([place]) 08/01/2017   CKD (chronic kidney disease) stage 3, GFR 30-59 ml/min ([facility]) 08/01/2017   Acute pyelonephritis 04/13/2017   Chronic low back pain 05/04/2016   Easy bruising 12/02/2015   Dementia ([place]) 09/17/2015   Encounter for immunization 06/03/2015   Fatigue 11/25/2014   Degenerative joint disease 05/26/2014   Hives 04/08/2014   Dyspnea  02/03/2014   Peripheral neuropathy ([place]) 02/05/2013   Bilateral foot pain 11/20/2012   Vaginal itching 06/26/2012   Low blood pressure 06/19/2012   Liver abscess 06/19/2012   Normocytic anemia 05/07/2012   Hypokalemia 05/06/2012   Fever 05/06/2012   Dehydration 05/06/2012   Hepatic abscess 05/03/2012   Rash 11/20/2011   Encounter for well adult exam with abnormal findings 11/16/2011   Anxiety 11/16/2011   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   Essential hypertension 02/24/2010   DERMATITIS, ATOPIC 01/20/2010   Edema 12/28/2009   Vitamin D deficiency 11/10/2009   UNS ADVRS EFF OTH RX MEDICINAL&BIOLOGICAL SBSTNC 11/10/2009   TACHYCARDIA 10/05/2009   OTHER POSTSURGICAL STATUS OTHER 07/21/2009   POSTMENOPAUSAL SYNDROME 03/09/2009   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/17/2008   INSOMNIA 06/17/2008   Hypothyroidism 02/12/2008   Renal failure 02/12/2008   Polycystic kidney 02/12/2008   GERD 06/12/2007   Disorder of bone and cartilage 06/12/2007   Hyperlipidemia 03/25/2007    Past Medical History:  Diagnosis Date   Anxiety    Atherosclerosis of aorta ([place]) 08/01/2017   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   GERD    History of kidney stones    Hyperlipidemia    Hypertension    HYPOTHYROIDISM    INSOMNIA    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    OSTEOPENIA    Polycystic kidney disease    RENAL FAILURE    VITAMIN D DEFICIENCY     Past Surgical History:  Procedure Laterality Date   ABDOMINAL HYSTERECTOMY     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     bladder operation, but per sling  procedure     BUNIONECTOMY     CERVICAL DISC SURGERY     fx rt ankle     left temporal bx artery-negative 2      PARTIAL NEPHRECTOMY Left    percutaneous drainage of liver abscess  05/06/2012    Social History   Socioeconomic History   Marital status: Widowed    Spouse name: Not on file   Number of children: 1   Years of education:  Not on file   Highest education level: Not on file  Occupational History   Occupation: Retired  Tobacco Use   Smoking status: Never   Smokeless tobacco: Never  Vaping Use   Vaping Use: Never used  Substance and Sexual Activity   Alcohol use: No   Drug use: No   Sexual activity: Not Currently    Birth control/protection: Post-menopausal  Other Topics Concern   Not on file  Social History Narrative   Widowed since December 2021; married for over 77 years.   Lives in a 2-story home with cat.   Right handed   No caffeine   Social Determinants of Health   Financial Resource Strain: Low Risk  (10/26/2021)   Overall Financial Resource Strain (CARDIA)    Difficulty of Paying Living Expenses: Not hard at all  Food Insecurity: No Food Insecurity (10/26/2021)   Hunger Vital Sign    Worried About Running Out of Food in the Last Year: Never true    Ran Out of Food in the Last Year: Never true  Transportation Needs: No Transportation Needs (10/26/2021)   PRAPARE - Hydrologist (Medical): No    Lack of Transportation (Non-Medical): No  Physical Activity: Sufficiently Active (10/26/2021)   Exercise Vital Sign    Days of Exercise per Week: 5 days    Minutes of Exercise per Session: 30 min  Stress: No Stress Concern Present (10/26/2021)   [place]    Feeling of Stress : Not at all  Social Connections: Moderately Integrated (10/26/2021)   Social Connection and Isolation Panel [NHANES]    Frequency of Communication with Friends and Family: More than three times a week    Frequency of Social Gatherings with Friends and Family: Once a week    Attends Religious Services: More than 4 times per year    Active Member of [employer] or Organizations: No    Attends Music therapist: More than 4 times per year    Marital Status: Widowed  Intimate Partner Violence: Not At Risk (10/26/2021)   Humiliation,  Afraid, Rape, and Kick questionnaire    Fear of Current or Ex-Partner: No    Emotionally Abused: No    Physically Abused: No    Sexually Abused: No    Family History  Problem Relation Age of Onset   Stroke Mother    Stomach cancer Sister    Colon cancer Neg Hx    Colon polyps Neg Hx    Esophageal cancer Neg Hx    Rectal cancer Neg Hx     Medications Prior to Admission  Medication Sig Dispense Refill Last Dose   amLODipine (NORVASC) 5 MG tablet Take 1 tablet (5 mg total) by mouth daily. 90 tablet 3 07/26/2022 at 0500   ARIPiprazole (ABILIFY) 5 MG tablet Take 1 tablet (5 mg total) by mouth daily. 90 tablet 3 07/26/2022 at 0500   donepezil (ARICEPT) 10 MG tablet Take 1 tablet (10 mg  total) by mouth at bedtime. 90 tablet 3 07/26/2022 at 0500   labetalol (NORMODYNE) 300 MG tablet Take 1 tablet (300 mg total) by mouth 2 (two) times daily. 180 tablet 3 07/26/2022 at 0500   rosuvastatin (CRESTOR) 20 MG tablet Take 20 mg by mouth daily.   07/25/2022   sertraline (ZOLOFT) 100 MG tablet 1 tab by mouth once daily 90 tablet 3 07/26/2022 at 0500   telmisartan (MICARDIS) 80 MG tablet Take 1 tablet (80 mg total) by mouth daily. 90 tablet 3 07/25/2022   vitamin B-12 (CYANOCOBALAMIN) 1000 MCG tablet Take 1 tablet (1,000 mcg total) by mouth daily. 90 tablet 3 07/25/2022   ALPRAZolam (XANAX) 0.5 MG tablet 1 tab by mouth twice per day as needed 60 tablet 5 More than a month   bisacodyl (DULCOLAX) 5 MG EC tablet Take 5 mg by mouth once.   for procedure   hydrocortisone (ANUSOL-HC) 2.5 % rectal cream APPLY RECTALLY TWICE DAILY (Patient not taking: Reported on 07/10/2022) 30 g 1 More than a month   metroNIDAZOLE (FLAGYL) 500 MG tablet Take 1,000 mg by mouth 3 (three) times daily.   for procedure   neomycin (MYCIFRADIN) 500 MG tablet Take 1,000 mg by mouth 3 (three) times daily.   for procedure    Current Facility-Administered Medications  Medication Dose Route Frequency Provider Last Rate Last Admin   bisacodyl  (DULCOLAX) EC tablet 20 mg  20 mg Oral Once Michael Boston, MD       bupivacaine liposome (EXPAREL) 1.3 % injection 266 mg  20 mL Infiltration Once Michael Boston, MD       cefoTEtan (CEFOTAN) 2 g in sodium chloride 0.9 % 100 mL IVPB  2 g Intravenous On Call to OR Michael Boston, MD       feeding supplement (ENSURE PRE-SURGERY) liquid 592 mL  592 mL Oral Once Michael Boston, MD       lactated ringers infusion   Intravenous Continuous Barnet Glasgow, MD 10 mL/hr at 07/26/22 0728 New Bag at 07/26/22 0728   metoprolol tartrate (LOPRESSOR) injection 5 mg  5 mg Intravenous Q6H PRN Michael Boston, MD         Allergies  Allergen Reactions   Codeine Swelling   Shellfish Allergy Swelling    Throat swells   Sulfonamide Derivatives Swelling   Zocor [Simvastatin] Other (See Comments)    Cognitive decrease    BP 122/69   Pulse (!) 58   Temp 98.3 F (36.8 C) (Oral)   Resp 18   Ht '5\' 3"'$  (1.6 m)   Wt 57 kg   SpO2 93%   BMI 22.26 kg/m   Labs: No results found for this or any previous visit (from the past 48 hour(s)).  Imaging / Studies: No results found.   Adin Hector, M.D., F.A.C.S. Gastrointestinal and Minimally Invasive Surgery [facility] [address], [place], [place] [address] [phone] Main / Paging  07/26/2022 7:46 AM    Adin Hector

## 2022-07-26 NOTE — Telephone Encounter (Signed)
Post procedure follow up phone call. Patient is in surgery this morning.

## 2022-07-26 NOTE — Transfer of Care (Signed)
Immediate Anesthesia Transfer of Care Note  Patient: JAMMY STLOUIS  Procedure(s) Performed: ROBOTIC LOW ANTERIOR RECTOSIGMOID RESECTION RECTOPEXY RIGID PROCTOSCOPY  Patient Location: PACU  Anesthesia Type:General  Level of Consciousness: awake, alert , oriented, and patient cooperative  Airway & Oxygen Therapy: Patient Spontanous Breathing and Patient connected to face mask oxygen  Post-op Assessment: Report given to RN and Post -op Vital signs reviewed and stable  Post vital signs: Reviewed and stable  Last Vitals:  Vitals Value Taken Time  BP 164/83 07/26/22 1230  Temp 36.6 C 07/26/22 1208  Pulse 51 07/26/22 1242  Resp 13 07/26/22 1242  SpO2 97 % 07/26/22 1242  Vitals shown include unvalidated device data.  Last Pain:  Vitals:   07/26/22 1215  TempSrc:   PainSc: Asleep         Complications: No notable events documented.

## 2022-07-26 NOTE — Discharge Instructions (Addendum)
SURGERY: POST OP INSTRUCTIONS (Surgery for small bowel obstruction, colon resection, etc)   ######################################################################  EAT Gradually transition to a high fiber diet with a fiber supplement over the next few days after discharge  WALK Walk an hour a day.  Control your pain to do that.    CONTROL PAIN Control pain so that you can walk, sleep, tolerate sneezing/coughing, go up/down stairs.  HAVE A BOWEL MOVEMENT DAILY Keep your bowels regular to avoid problems.  OK to try a laxative to override constipation.  OK to use an antidairrheal to slow down diarrhea.  Call if not better after 2 tries  CALL IF YOU HAVE PROBLEMS/CONCERNS Call if you are still struggling despite following these instructions. Call if you have concerns not answered by these instructions  ######################################################################   DIET Follow a light diet the first few days at home.  Start with a bland diet such as soups, liquids, starchy foods, low fat foods, etc.  If you feel full, bloated, or constipated, stay on a ful liquid or pureed/blenderized diet for a few days until you feel better and no longer constipated. Be sure to drink plenty of fluids every day to avoid getting dehydrated (feeling dizzy, not urinating, etc.). Gradually add a fiber supplement to your diet over the next week.  Gradually get back to a regular solid diet.  Avoid fast food or heavy meals the first week as you are more likely to get nauseated. It is expected for your digestive tract to need a few months to get back to normal.  It is common for your bowel movements and stools to be irregular.  You will have occasional bloating and cramping that should eventually fade away.  Until you are eating solid food normally, off all pain medications, and back to regular activities; your bowels will not be normal. Focus on eating a low-fat, high fiber diet the rest of your life  (See Getting to [place], below).  CARE of your INCISION or WOUND  It is good for closed incisions and even open wounds to be washed every day.  Shower every day.  Short baths are fine.  Wash the incisions and wounds clean with soap & water.    You may leave closed incisions open to air if it is dry.   You may cover the incision with clean gauze & replace it after your daily shower for comfort.  TEGADERM:  You have clear gauze band-aid dressings over your closed incision(s).  Remove the dressings 3 days after surgery.= 12/9 Saturday    If you have an open wound with a wound vac, see wound vac care instructions.    ACTIVITIES as tolerated Start light daily activities --- self-care, walking, climbing stairs-- beginning the day after surgery.  Gradually increase activities as tolerated.  Control your pain to be active.  Stop when you are tired.  Ideally, walk several times a day, eventually an hour a day.   Most people are back to most day-to-day activities in a few weeks.  It takes 4-8 weeks to get back to unrestricted, intense activity. If you can walk 30 minutes without difficulty, it is safe to try more intense activity such as jogging, treadmill, bicycling, low-impact aerobics, swimming, etc. Save the most intensive and strenuous activity for last (Usually 4-8 weeks after surgery) such as sit-ups, heavy lifting, contact sports, etc.  Refrain from any intense heavy lifting or straining until you are off narcotics for pain control.  You will have off  days, but things should improve week-by-week. DO NOT PUSH THROUGH PAIN.  Let pain be your guide: If it hurts to do something, don't do it.  Pain is your body warning you to avoid that activity for another week until the pain goes down. You may drive when you are no longer taking narcotic prescription pain medication, you can comfortably wear a seatbelt, and you can safely make sudden turns/stops to protect yourself without hesitating due to  pain. You may have sexual intercourse when it is comfortable. If it hurts to do something, stop.  MEDICATIONS Take your usually prescribed home medications unless otherwise directed.   Blood thinners:  Usually you can restart any strong blood thinners after the second postoperative day.  It is OK to take aspirin right away.     If you are on strong blood thinners (warfarin/Coumadin, Plavix, Xerelto, Eliquis, Pradaxa, etc), discuss with your surgeon, medicine PCP, and/or cardiologist for instructions on when to restart the blood thinner & if blood monitoring is needed (PT/INR blood check, etc).     PAIN CONTROL Pain after surgery or related to activity is often due to strain/injury to muscle, tendon, nerves and/or incisions.  This pain is usually short-term and will improve in a few months.  To help speed the process of healing and to get back to regular activity more quickly, DO THE FOLLOWING THINGS TOGETHER: Increase activity gradually.  DO NOT PUSH THROUGH PAIN Use Ice and/or Heat Try Gentle Massage and/or Stretching Take over the counter pain medication Take Narcotic prescription pain medication for more severe pain  Good pain control = faster recovery.  It is better to take more medicine to be more active than to stay in bed all day to avoid medications.  Increase activity gradually Avoid heavy lifting at first, then increase to lifting as tolerated over the next 6 weeks. Do not "push through" the pain.  Listen to your body and avoid positions and maneuvers than reproduce the pain.  Wait a few days before trying something more intense Walking an hour a day is encouraged to help your body recover faster and more safely.  Start slowly and stop when getting sore.  If you can walk 30 minutes without stopping or pain, you can try more intense activity (running, jogging, aerobics, cycling, swimming, treadmill, sex, sports, weightlifting, etc.) Remember: If it hurts to do it, then don't do  it! Use Ice and/or Heat You will have swelling and bruising around the incisions.  This will take several weeks to resolve. Ice packs or heating pads (6-8 times a day, 30-60 minutes at a time) will help sooth soreness & bruising. Some people prefer to use ice alone, heat alone, or alternate between ice & heat.  Experiment and see what works best for you.  Consider trying ice for the first few days to help decrease swelling and bruising; then, switch to heat to help relax sore spots and speed recovery. Shower every day.  Short baths are fine.  It feels good!  Keep the incisions and wounds clean with soap & water.   Try Gentle Massage and/or Stretching Massage at the area of pain many times a day Stop if you feel pain - do not overdo it Take over the counter pain medication This helps the muscle and nerve tissues become less irritable and calm down faster Choose ONE of the following over-the-counter anti-inflammatory medications: Acetaminophen 585m tabs (Tylenol) 1-2 pills with every meal and just before bedtime (avoid if you have liver  problems or if you have acetaminophen in you narcotic prescription) Naproxen 217m tabs (ex. Aleve, Naprosyn) 1-2 pills twice a day (avoid if you have kidney, stomach, IBD, or bleeding problems) Ibuprofen 2093mtabs (ex. Advil, Motrin) 3-4 pills with every meal and just before bedtime (avoid if you have kidney, stomach, IBD, or bleeding problems) Take with food/snack several times a day as directed for at least 2 weeks to help keep pain / soreness down & more manageable. Take Narcotic prescription pain medication for more severe pain A prescription for strong pain control is often given to you upon discharge (for example: oxycodone/Percocet, hydrocodone/Norco/Vicodin, or tramadol/Ultram) Take your pain medication as prescribed. Be mindful that most narcotic prescriptions contain Tylenol (acetaminophen) as well - avoid taking too much Tylenol. If you are having  problems/concerns with the prescription medicine (does not control pain, nausea, vomiting, rash, itching, etc.), please call [phone] see if we need to switch you to a different pain medicine that will work better for you and/or control your side effects better. If you need a refill on your pain medication, you must call the office before 4 pm and on weekdays only.  By federal law, prescriptions for narcotics cannot be called into a pharmacy.  They must be filled out on paper & picked up from our office by the patient or authorized caretaker.  Prescriptions cannot be filled after 4 pm nor on weekends.    WHEN TO CALL [phone] uncontrolled or worsening pain  Fever over 101 F (38.5 C) Concerns with the incision: Worsening pain, redness, rash/hives, swelling, bleeding, or drainage Reactions / problems with new medications (itching, rash, hives, nausea, etc.) Nausea and/or vomiting Difficulty urinating Difficulty breathing Worsening fatigue, dizziness, lightheadedness, blurred vision Other concerns If you are not getting better after two weeks or are noticing you are getting worse, contact our office [phone] for further advice.  We may need to adjust your medications, re-evaluate you in the office, send you to the emergency room, or see what other things we can do to help. The clinic staff is available to answer your questions during regular business hours (8:30am-5pm).  Please don't hesitate to call and ask to speak to one of our nurses for clinical concerns.    [facility] is always on call at the hospitals 24 hours/day If you have a medical emergency, go to the nearest emergency room or call 911.  FOLLOW UP in our office One the day of your discharge from the hospital (or the next business weekday), please call [facility] to set up or confirm an appointment to see your surgeon in the office for a follow-up appointment.   Usually it is 2-3 weeks after your surgery.   If you have skin staples at your incision(s), let the office know so we can set up a time in the office for the nurse to remove them (usually around 10 days after surgery). Make sure that you call for appointments the day of discharge (or the next business weekday) from the hospital to ensure a convenient appointment time. IF YOU HAVE DISABILITY OR FAMILY LEAVE FORMS, BRING THEM TO THE OFFICE FOR PROCESSING.  DO NOT GIVE THEM TO YOUR DOCTOR.  [facility] [address] [address]  [id number] ([phone] Main [phone] [place] ([phone] Fax [email or web address]    GETTING TO GOOD BOWEL HEALTH. It is expected  for your digestive tract to need a few months to get back to normal.  It is common for your bowel movements and stools to be irregular.  You will have occasional bloating and cramping that should eventually fade away.  Until you are eating solid food normally, off all pain medications, and back to regular activities; your bowels will not be normal.   Avoiding constipation The goal: ONE SOFT BOWEL MOVEMENT A DAY!    Drink plenty of fluids.  Choose water first. TAKE A FIBER SUPPLEMENT EVERY DAY THE REST OF YOUR LIFE During your first week back home, gradually add back a fiber supplement every day Experiment which form you can tolerate.   There are many forms such as powders, tablets, wafers, gummies, etc Psyllium bran (Metamucil), methylcellulose (Citrucel), Miralax or Glycolax, Benefiber, Flax Seed.  Adjust the dose week-by-week (1/2 dose/day to 6 doses a day) until you are moving your bowels 1-2 times a day.  Cut back the dose or try a different fiber product if it is giving you problems such as diarrhea or bloating. Sometimes a laxative is needed to help jump-start bowels if constipated until the fiber supplement can help regulate your bowels.  If you are tolerating eating  & you are farting, it is okay to try a gentle laxative such as double dose MiraLax, prune juice, or Milk of Magnesia.  Avoid using laxatives too often. Stool softeners can sometimes help counteract the constipating effects of narcotic pain medicines.  It can also cause diarrhea, so avoid using for too long. If you are still constipated despite taking fiber daily, eating solids, and a few doses of laxatives, call our office. Controlling diarrhea Try drinking liquids and eating bland foods for a few days to avoid stressing your intestines further. Avoid dairy products (especially milk & ice cream) for a short time.  The intestines often can lose the ability to digest lactose when stressed. Avoid foods that cause gassiness or bloating.  Typical foods include beans and other legumes, cabbage, broccoli, and dairy foods.  Avoid greasy, spicy, fast foods.  Every person has some sensitivity to other foods, so listen to your body and avoid those foods that trigger problems for you. Probiotics (such as active yogurt, Align, etc) may help repopulate the intestines and colon with normal bacteria and calm down a sensitive digestive tract Adding a fiber supplement gradually can help thicken stools by absorbing excess fluid and retrain the intestines to act more normally.  Slowly increase the dose over a few weeks.  Too much fiber too soon can backfire and cause cramping & bloating. It is okay to try and slow down diarrhea with a few doses of antidiarrheal medicines.   Bismuth subsalicylate (ex. Kayopectate, Pepto Bismol) for a few doses can help control diarrhea.  Avoid if pregnant.   Loperamide (Imodium) can slow down diarrhea.  Start with one tablet (6m) first.  Avoid if you are having fevers or severe pain.  ILEOSTOMY PATIENTS WILL HAVE CHRONIC DIARRHEA since their colon is not in use.    Drink plenty of liquids.  You will need to drink even more glasses of water/liquid a day to avoid getting dehydrated. Record  output from your ileostomy.  Expect to empty the bag every 3-4 hours at first.  Most people with a permanent ileostomy empty their bag 4-6 times at the least.   Use antidiarrheal medicine (especially Imodium) several times a day to avoid getting dehydrated.  Start with a dose at bedtime &  breakfast.  Adjust up or down as needed.  Increase antidiarrheal medications as directed to avoid emptying the bag more than 8 times a day (every 3 hours). Work with your wound ostomy nurse to learn care for your ostomy.  See ostomy care instructions. TROUBLESHOOTING IRREGULAR BOWELS 1) Start with a soft & bland diet. No spicy, greasy, or fried foods.  2) Avoid gluten/wheat or dairy products from diet to see if symptoms improve. 3) Miralax 17gm or flax seed mixed in [place]. water or juice-daily. May use 2-4 times a day as needed. 4) Gas-X, Phazyme, etc. as needed for gas & bloating.  5) Prilosec (omeprazole) over-the-counter as needed 6)  Consider probiotics (Align, Activa, etc) to help calm the bowels down  Call your doctor if you are getting worse or not getting better.  Sometimes further testing (cultures, endoscopy, X-ray studies, CT scans, bloodwork, etc.) may be needed to help diagnose and treat the cause of the diarrhea. [facility], [place], [place], [place], [place]  [address] [phone] - Main.    [phone]  - Toll Free.   [phone] - Fax [email or web address]   Pelvic floor muscle training exercises ("Kegels") can help strengthen the muscles under the uterus, bladder, and bowel (large intestine). They can help both men and women who have problems with urine leakage or bowel control.  A pelvic floor muscle training exercise is like pretending that you have to urinate, and then holding it. You relax and tighten the muscles that control urine flow. It's important to find the right muscles to tighten.  The next time you have to urinate, start to go and  then stop. Feel the muscles in your vagina, bladder, or anus get tight and move up. These are the pelvic floor muscles. If you feel them tighten, you've done the exercise right. If you are still not sure whether you are tightening the right muscles, keep in mind that all of the muscles of the pelvic floor relax and contract at the same time. Because these muscles control the bladder, rectum, and vagina, the following tips may help: Women: Insert a finger into your vagina. Tighten the muscles as if you are holding in your urine, then let go. You should feel the muscles tighten and move up and down.  Men: Insert a finger into your rectum. Tighten the muscles as if you are holding in your urine, then let go. You should feel the muscles tighten and move up and down. These are the same muscles you would tighten if you were trying to prevent yourself from passing gas.  It is very important that you keep the following muscles relaxed while doing pelvic floor muscle training exercises: Abdominal  Buttocks (the deeper, anal sphincter muscle should contract)  Thigh   A woman can also strengthen these muscles by using a vaginal cone, which is a weighted device that is inserted into the vagina. Then you try to tighten the pelvic floor muscles to hold the device in place. If you are unsure whether you are doing the pelvic floor muscle training correctly, you can use biofeedback and electrical stimulation to help find the correct muscle group to work. Biofeedback is a method of positive reinforcement. Electrodes are placed on the abdomen and along the anal area. Some therapists place a sensor in the vagina in women or anus in men to monitor the contraction of pelvic floor muscles.  A monitor will display a graph showing which muscles are contracting and  which are at rest. The therapist can help find the right muscles for performing pelvic floor muscle training exercises.   PERFORMING PELVIC FLOOR EXERCISES: 1.  Begin by emptying your bladder. 2. Tighten the pelvic floor muscles and hold for a count of 10. 3. Relax the muscles completely for a count of 10. 4. Do 10 repititions, 3 to 5 times a day (morning, afternoon, and night). You can do these exercises at any time and any place. Most people prefer to do the exercises while lying down or sitting in a chair. After 4 - 6 weeks, most people notice some improvement. It may take as long as 3 months to see a major change. After a couple of weeks, you can also try doing a single pelvic floor contraction at times when you are likely to leak (for example, while getting out of a chair). A word of caution: Some people feel that they can speed up the progress by increasing the number of repetitions and the frequency of exercises. However, over-exercising can instead cause muscle fatigue and increase urine leakage. If you feel any discomfort in your abdomen or back while doing these exercises, you are probably doing them wrong. Breathe deeply and relax your body when you are doing these exercises. Make sure you are not tightening your stomach, thigh, buttock, or chest muscles. When done the right way, pelvic floor muscle exercises have been shown to be very effective at improving urinary continence.  Pelvic Floor Pain / Incontinence  Do you suffer from pelvic pain or incontinence? Do you have pain in the pelvis, low back or hips that is associated with sitting, walking, urination or intercourse? Have you experienced leaking of urine or feces when coughing, sneezing or laughing? Do you have pain in the pelvic area associated with cancer?  These are conditions that are common with pelvic floor muscle dysfunction. Over time, due to stress, scar tissue, surgeries and the natural course of aging, our muscles may become weak or overstressed and can spasm. This can lead to pain, weakness, incontinence or decreased quality of life.  Men and women with pelvic floor dysfunction  frequently describe:  A "falling out" feeling. Pain or burning in the abdomen, tailbone or perineal area. Constipation or bowel elimination problems or difficulty initiating urination. Unresolved low back or hip pain. Frequency and urgency when going to the bathroom. Leaking of urine or feces. Pain with intercourse.  [email or web address]  To make a referral or for more information about [facility] Pelvic Floor Therapy Program, call  [facility]) - [place] [phone] [place]) - [facility] [facility]) - [phone]

## 2022-07-26 NOTE — H&P (Signed)
07/26/2022    REFERRING PHYSICIAN: Levin Erp,*  Patient Care Team: Biagio Borg, MD as PCP - General (Internal Medicine)  PROVIDER: Hollace Kinnier, MD  [facility] MRN: [id number] DOB: 1940/11/27  SUBJECTIVE   Chief Complaint: New Consultation (eval of rectal prolapse)   History of Present Illness: Alyssa Fernandez is a 81 y.o. female who is seen today  as an office consultation at the request of Dr. Fabio Asa  for evaluation of New Consultation (eval of rectal prolapse) .   Pleasant elderly woman. Lives by herself. Daughter works in the oncology through the [employer] and very involved. Some question of early onset dementia with some mild memory issues but still independent and active. Patient has noted worsening tissue prolapsing out the anus. Noted to her primary care physician who they see almost on a monthly basis. Suspected initially to be hemorrhoids but had persistent problems. Sent to see gastroenterology. They were concerned about complete circumferential rectal prolapse so surgical consultation requested.  Patient notes that she has to wear diapers now. Leaking mucus often. Does not recall any prior anorectal procedures. She recalls having hysterectomy and cholecystectomy but no other abdominal surgery. Usually moves her bowels about every other day. She can walk half mile without problems. Occasionally has to pause but then can walk the rest of the way. 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 No history of heart attack or stroke. No diabetes. No severe kidney disease. Has some high blood pressure but otherwise feels very healthy and active.  Medical History:  Past Medical History:  Diagnosis Date  Anxiety  Arthritis  Chronic kidney disease  Hyperlipidemia  Hypertension   There is no problem list on file for this patient.  Past Surgical History:  Procedure Laterality Date  HYSTERECTOMY  LAPAROSCOPIC CHOLECYSTECTOMY    Allergies  Allergen  Reactions  Codeine Hives and Swelling   Current Outpatient Medications on File Prior to Visit  Medication Sig Dispense Refill  amLODIPine (NORVASC) 5 MG tablet  ARIPiprazole (ABILIFY) 5 MG tablet  cyanocobalamin (VITAMIN B12) 1000 MCG tablet Take by mouth  donepeziL (ARICEPT) 10 MG tablet Take 10 mg by mouth at bedtime  labetaloL (TRANDATE) 300 MG tablet  sertraline (ZOLOFT) 100 MG tablet  telmisartan (MICARDIS) 80 MG tablet  rosuvastatin (CRESTOR) 20 MG tablet   No current facility-administered medications on file prior to visit.   Family History  Problem Relation Age of Onset  High blood pressure (Hypertension) Brother    Social History   Tobacco Use  Smoking Status Never  Smokeless Tobacco Never    Social History   Socioeconomic History  Marital status: Widowed  Tobacco Use  Smoking status: Never  Smokeless tobacco: Never  Substance and Sexual Activity  Alcohol use: Not Currently  Drug use: Never   ############################################################  Review of Systems: A complete review of systems (ROS) was obtained from the patient. I have reviewed this information and discussed as appropriate with the patient. See HPI as well for other pertinent ROS.  Constitutional: No fevers, chills, sweats. Weight stable Eyes: No vision changes, No discharge HENT: No sore throats, nasal drainage Lymph: No neck swelling, No bruising easily Pulmonary: No cough, productive sputum CV: No orthopnea, PND . No exertional chest/neck/shoulder/arm pain. Patient can walk 1/2 mile without difficulty.   GI: No personal nor family history of GI/colon cancer, inflammatory bowel disease, irritable bowel syndrome, allergy such as Celiac Sprue, dietary/dairy problems, colitis, ulcers nor gastritis. No recent sick contacts/gastroenteritis. No travel outside the  country. No changes in diet.  Renal: No UTIs, No hematuria Genital: No drainage, bleeding, masses Musculoskeletal: No  severe joint pain. Good ROM major joints Skin: No sores or lesions Heme/Lymph: No easy bleeding. No swollen lymph nodes Neuro: No active seizures. No facial droop Psych: No hallucinations. No agitation  OBJECTIVE   Vitals:  06/21/22 0932  BP: (!) 140/80  Pulse: 67  Temp: 37.2 C (99 F)  SpO2: 92%  Weight: 58.1 kg (128 lb)  Height: 160 cm ('5\' 3"'$ )   Body mass index is 22.67 kg/m.  PHYSICAL EXAM:  Constitutional: Not cachectic. Hygeine adequate. Vitals signs as above.  Eyes: No glasses. Vision adequate,Pupils reactive, normal extraocular movements. Sclera nonicteric Neuro: CN II-XII intact. No major focal sensory defects. No major motor deficits. Lymph: No head/neck/groin lymphadenopathy Psych: No severe agitation. No severe anxiety. Judgment & insight Adequate, Oriented x4, HENT: Normocephalic, Mucus membranes moist. No thrush. Hearing: adequate Neck: Supple, No tracheal deviation. No obvious thyromegaly Chest: No pain to chest wall compression. Good respiratory excursion. No audible wheezing CV: Pulses intact. regular. No major extremity edema Ext: No obvious deformity or contracture. Edema: Not present. No cyanosis Skin: No major subcutaneous nodules. Warm and dry Musculoskeletal: Severe joint rigidity not present. No obvious clubbing. No digital petechiae. Mobility: no assist device moving easily without restrictions  Abdomen: Flat Soft. Nondistended. Nontender. Hernia: Not present. Diastasis recti: Not present. No hepatomegaly. No splenomegaly.  Genital/Pelvic: Inguinal hernia: Not present. Inguinal lymph nodes: without lymphadenopathy nor hidradenitis.   Rectal:   ##################################  Perianal skin Mild fecal soiling  Pruritis ani: Mild Pilonidal disease: Not present Condyloma / warts: Not present  Anal fissure: Not present Perirectal abscess/fistula Not present External hemorrhoids left anterior midline external hemorrhoid  Digital and anoscopic  rectal exam tolerated  Sphincter tone very decreased with very weak squeeze. Posterior two thirds ring intact right anterior midline thinned out Hemorrhoidal piles Grade 1 normal Prostate: N/A Rectovaginal septum: The thinned out but no rectocele. Rectal masses: No obvious mass palpated to 9 cm by digital exam.  Other significant findings: Circumferential rectal prolapse easily with straining. No obvious major proctitis. Rather redundant rectum. Some fullness at the tip but stretches out flattens out arguing against any mass or lead point.  Patient examined with assistance of female Medical Assistant in the room with patient in decubitus position .  ###################################    ###################################################################  Labs, Imaging and Diagnostic Testing:  Located in [place]' section of Epic EMR chart  PRIOR [facility] CLINIC NOTES:  Not applicable  SURGERY NOTES:  Not applicable  PATHOLOGY:  Not applicable  Assessment and Plan:  DIAGNOSES:  There are no diagnoses linked to this encounter.   ASSESSMENT/PLAN  Pleasant elderly but active woman with complete circumferential rectal prolapse and incontinence.  Standard of care is surgery. Looking at prior CAT scan and on digital exam expect she has fair amount of redundancy. We will do robotic rectosigmoid resection with suture rectopexy. She is rather healthy for age and is not superhigh risk factors for leak. Minimally invasive robotic approach.  The anatomy & physiology of the digestive tract was discussed. The pathophysiology of rectal prolapse was discussed. Natural history risks without surgery was discussed. I feel the risks of no intervention will lead to serious problems that outweigh the operative risks; therefore, I recommended surgery to treat the pathology. Possible need for sigmoid colectomy to remove redundant colon was discussed. Pexy by suture and probable mesh  reinforcement was discussed as well. Laparoscopic &  open techniques were discussed.   Risks such as bleeding, infection, abscess, leak, reoperation, possible ostomy, hernia, stroke, heart attack, death, and other risks were discussed. I noted a good likelihood this will help address the problem. Goals of post-operative recovery were discussed as well. We will work to minimize complications. An educational handout on the technique was given as well. Questions were answered. The patient expresses understanding & wishes to proceed with surgery.  I do think would be helpful that she get a colonoscopy or her done preoperatively make sure there is no lead border any surprises. We will see if we can coordinate this the day before surgery so the patient just gets 1 bowel prep. Through [employer].  She has a diagnosis of some dementia with mixed vascular/Alzheimer etiology according to [facility] neurology. She seems to be quite functioning and rather active and still lives at home alone. Daughter works in oncology through the [employer] and is very involved in here today. She will require general esthesia. Mother may be a theoretical increased risk of exacerbating dementia, I think we can minimize these risks doing this electively. Despite the increased risk, I do not think she can stay completely incontinent. They wish to be aggressive and proceed.  ADDENDUM:  D/W GI.  DR dANIS NOTED POORLY CONTROLLED HTN BUT NO CONCERNING MASS/TUMOR W COLONOSCOPY  READY FOR SURGERY Adin Hector, MD, FACS, MASCRS Esophageal, Gastrointestinal & Colorectal Surgery Robotic and Minimally Invasive Surgery  [facility] [address] [place], [place] [address] [phone] Fax [phone] Main  CONTACT INFORMATION:  Weekday (9AM-5PM): Call [facility] main office at [phone]  Weeknight (5PM-9AM) or Weekend/Holiday: Check [email or web address] (password "  [email or web address]") for General Surgery [facility] coverage  (Please, do not use SecureChat as it is not reliable communication to reach operating surgeons for immediate patient care given surgeries/outpatient duties/clinic/cross-coverage/off post-call which would lead to a delay in care.  Epic staff messaging available for outptient concerns, but may not be answered for 48 hours or more).    07/26/2022

## 2022-07-26 NOTE — Anesthesia Postprocedure Evaluation (Signed)
Anesthesia Post Note  Patient: Alyssa Fernandez  Procedure(s) Performed: ROBOTIC LOW ANTERIOR RECTOSIGMOID RESECTION RECTOPEXY RIGID PROCTOSCOPY     Patient location during evaluation: PACU Anesthesia Type: General Level of consciousness: awake and alert Pain management: pain level controlled Vital Signs Assessment: post-procedure vital signs reviewed and stable Respiratory status: spontaneous breathing, nonlabored ventilation, respiratory function stable and patient connected to nasal cannula oxygen Cardiovascular status: blood pressure returned to baseline, stable and bradycardic Postop Assessment: no apparent nausea or vomiting Anesthetic complications: no   No notable events documented.  Last Vitals:  Vitals:   07/26/22 1230 07/26/22 1245  BP: (!) 164/83 (!) 152/76  Pulse: (!) 50 (!) 51  Resp: 13 13  Temp:    SpO2: 96% 95%    Last Pain:  Vitals:   07/26/22 1230  TempSrc:   PainSc: [place] Sintia Mckissic

## 2022-07-26 NOTE — Anesthesia Procedure Notes (Signed)
Procedure Name: Intubation Date/Time: 07/26/2022 8:41 AM  Performed by: Raenette Rover, CRNAPre-anesthesia Checklist: Patient identified, Emergency Drugs available, Suction available and Patient being monitored Patient Re-evaluated:Patient Re-evaluated prior to induction Oxygen Delivery Method: Circle system utilized Preoxygenation: Pre-oxygenation with 100% oxygen Induction Type: IV induction Ventilation: Mask ventilation without difficulty Laryngoscope Size: Mac and 3 Grade View: Grade I Tube type: Oral Tube size: 7.0 mm Number of attempts: 1 Airway Equipment and Method: Stylet Placement Confirmation: ETT inserted through vocal cords under direct vision, positive ETCO2 and breath sounds checked- equal and bilateral Secured at: 21 cm Tube secured with: Tape Dental Injury: Teeth and Oropharynx as per pre-operative assessment

## 2022-07-26 NOTE — Op Note (Signed)
07/26/2022  11:53 AM  PATIENT:  Alyssa Fernandez  81 y.o. 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  This assistant was needed for exposure, dissection, suction, tissue approximation, retraction, perception, etc.  ANESTHESIA:     General  Regional TRANSVERSUS ABDOMINIS PLANE (TAP) nerve block for perioperative & postoperative pain control provided with liposomal bupivacaine (Experel) mixed with 0.25% bupivacaine as a Bilateral TAP block x 10m each side at the level of the transverse abdominis & preperitoneal spaces along the flank at the anterior axillary line, from subcostal ridge to iliac crest under laparoscopic guidance   Local field block at port sites & extraction wound  EBL:  Total I/O In: 1300 [I.V.:1200; IV Piggyback:100] Out: 250 [Urine:150; Blood:100]  Delay start of Pharmacological VTE agent (>24hrs) due to surgical blood loss or risk of bleeding:  no  DRAINS: 19 Fr Blake drain goes to the pelvis  SPECIMEN:   RECTOSIGMOID COLON (open end proximal) DISTAL ANASTOMOTIC RING (final distal margin)  DISPOSITION OF SPECIMEN:  PATHOLOGY  COUNTS:  YES  PLAN OF CARE: Admit to inpatient   PATIENT DISPOSITION:  PACU - hemodynamically  stable.  INDICATION:    Pleasant elderly woman mild eventually controlled and quite functional.  Close family involvement.  Having worsening complete circumferential rectal prolapse with incontinence of flatus and stool.  Felt medically stable to proceed with repair for better quality of life.  Underwent colonoscopy yesterday with no evidence of any obvious lead point mass or tumor.  Diverticulosis only.  I recommended segmental resection:  The anatomy & physiology of the digestive tract was discussed.  The pathophysiology was discussed.  Natural history risks without surgery was discussed.   I worked to give an overview of the disease and the frequent need to have multispecialty involvement.  I feel the risks of no intervention will lead to serious problems that outweigh the operative risks; therefore, I recommended a partial colectomy to remove the pathology.  Laparoscopic & open techniques were discussed.   Risks such as bleeding, infection, abscess, leak, reoperation, possible ostomy, hernia, heart attack, death, and other risks were discussed.  I noted a good likelihood this will help address the problem.   Goals of post-operative recovery were discussed as well.  We will work to minimize complications.  Educational materials on the pathology had been given in the office.  Questions were answered.    The patient expressed understanding & wished to proceed with surgery.  OR FINDINGS:   Patient had redundant rectosigmoid colon with significant prolapse of the mid and distal rectum.  Markedly decreased but intact sphincter tone.  No obvious metastatic disease on visceral parietal peritoneum or liver.  Proximal ileal Meckel's diverticulum smooth without any nodularity or atypia nor inflammation.  Left in situ.  The anastomosis rests 12 cm from the anal verge by rigid proctoscopy.  Is a 29 descending colon to mid rectal EEA  anastomosis  CASE DATA:  Type of patient?: Elective [facility] Private  Case  Status of Case? Elective Scheduled  Infection Present At Time Of Surgery (PATOS)?  NO  DESCRIPTION:   Informed consent was confirmed.  The patient underwent general anaesthesia without difficulty.  The patient was positioned appropriately.  VTE prevention in place.  The patient was clipped, prepped, & draped in a sterile fashion.  Surgical timeout confirmed our plan.  The patient was positioned in reverse Trendelenburg.  Abdominal entry was gained using Varess technique at the left subcostal ridge on the anterior abdominal wall.  No elevated EtCO2 noted.  Port placed.  Camera inspection revealed no injury.  Extra ports were carefully placed under direct laparoscopic visualization.  Upon entering the abdomen (organ space), I encountered no obvious evidence of any peritonitis or perforation.  Patient clearly had very redundant rectosigmoid colon going down the pelvis.  Had a very floppy stretched out small bowel and cecal and transverse colon mesentery.  Initially a challenge to ventilate given the Trendelenburg positioning so she was repositioned with decreased gas.  ET tube repositioned and able to ventilate/respirate much more easily.  Patient repositioned.  We reflected the greater omentum and the upper abdomen the small bowel in the upper abdomen.  The patient was carefully positioned.  The Intuitive daVinci robot was docked with camera & instruments carefully placed.  I mobilized the rectosigmoid colon & elevated it to put the main pedicle on tension.  I scored the base of peritoneum of the medial side of the mesentery of the elevated left colon from base of the inferior mesenteric pedicle to the mid rectum.   I elevated the sigmoid mesentery and entered into the retro-mesenteric plane. We were able to identify the left ureter and gonadal vessels. We kept those posterior within the retroperitoneum and elevated the left colon mesentery off that. I did isolate the inferior mesenteric artery (IMA)  pedicle but did not ligate it yet.  I continued distally and got into the avascular plane posterior to the mesorectum, sparing the nervi ergentes.. This allowed me to help mobilize the rectum as well by freeing the mesorectum off the sacrum.  I stayed away from the right and left ureters.  I kept the lateral vascular pedicles to the rectum intact.  We focused down and freed the mesorectum all the way down to the pelvic floor.  I did digital exam to confirm that I was at the level of the levators.  Took the mesentery on the right and left rectal mesorectum and came down to the peritoneal flexion which was quite low.  Patient had rather floppy rectum and vaginal cuff from prior hysterectomy.  Some adhesions between the posterior vaginal wall and anterior rectal wall.  Took our time and gradually freed and straighten this area out.  Redid anorectal examination to confirm that I had mobilization anteriorly all the way down to the anterior pelvic floor sphincter complex..  Worked to preserve the lateral blood supply.  With this we got much better mobilization and to get the mid rectum to reach up to the sacral promontory.  Skeletonized and transected through the mesorectum at the proximal/mid rectal junction.  Placed that on gentle tension confirmed the anorectal examination that the rectal prolapse had been reduced.  The vaginal cuff was clean and intact without injury.  I then came to the descending/sigmoid junction where there were good lateral colon adhesions.  Shows an area at the descending/sigmoid junction reach down to the sacral  promontory.  Transected the left mesocolon between the descending sigmoid colon in a radial fashion using vessel sealer carefully.  To access vascular perfusion of tissues, we asked anesthesia use intravenous  indocyanine green (ICG) with IV flush.  I switched to the NIR fluorescence (Firefly mode) imaging window on the daVinci robot platform.  We were able to see good light green  visualization of blood vessels with good vascular perfusion of tissues, confirming good tissue perfusion of tissues (descending colon and mid rectum) planned for anastomosis.  I transected at the mid rectum using a robotic stapler - 31m green load.  I then used 0 Prolene suture through the sacral promontory fascia left anterolateral and right anterior lateral for the start of the suture rectopexy and placed those needle and tails laterally.  We created an extraction incision through a small Pfannenstiel incision in the suprapubic region.  Placed a wound protector.  I was able to eviscerate the rectosigmoid and descending colon out the wound.   I clamped the colon proximal to this area using a reusable pursestringer device.  Passed a 2-0 Keith needle. I transected at the descending/sigmoid junction with a scalpel. I got healthy bleeding mucosa.  We sent the rectosigmoid colon specimen off to go to pathology.  We sized the colon orifice.  I chose a 273mEEA anvil stapler system.  I reinforced the prolene pursestring with interrupted silk "belt loop" sutures.  I placed the anvil to the open end of the proximal remaining colon and closed around it using the pursestring.    We did copious irrigation with crystalloid solution.  Hemostasis was good.  The distal end of the remaining colon reached down to the sacral promontory with minimal tension.      I scrubbed down and did gentle anal dilation and advanced the EEA stapler up the rectal stump. The spike was brought out at the provimal end of the rectal stump under direct visualization.  Dr ThMarcello Mooresattached the anvil of the proximal colon the spike of the stapler. Anvil was tightened down and held clamped for 60 seconds.  Orientation was confirmed such that there is no twisting of the colon nor small bowel underneath the mesenteric defect. No concerning tension.  The EEA stapler was fired and held clamped for 30 seconds. The stapler was released & removed. Blue stitch  is in the proximal ring.  Care was taken to ensure no other structures were incorporated within this either.  We noted 2 excellent anastomotic rings.   The colon proximal to the anastomosis was then gently occluded. The pelvis was filled with sterile irrigation.  I  did rigid proctoscopy noted the anastomosis was at 12 cm from the anal verge consistent with the proximal rectum.  There was a negative air leak test. There was mild tension of the anastomosis as planned to naturally help hold the rectal stump cephalad.   Tissues looked viable.  Ureters & bowel uninjured.  The anastomosis looked healthy.    Robot was read docked.  I used 2-0 Prolene sutures to come through the colorectal anastomosis right inferolateral and left inferolateral and tied the sutures down to have a good pexy of the anastomosis at the level of the sacral promontory for good rectopexy.  I then closed the mesorectum to the lateral pelvis using 2 OV lock running suture.  Right side V-Loc I used to redo rectopexy of the rectal stump to the sacral promontory as well.  Left side done from the left lateral  pelvis up to the ascending colon and lateral attachments.  Not helped close any potential defects in provide some additional pexy.  We positioned a drain come from one of the millimeter port sites to come underneath the descending colon mesentery and go into the presacral space.  Had some redundancy, to the left anterior pelvis as well.  Secured the skin with 2-0 Prolene suture.  Endoluminal gas was evacuated.  Ports & wound protector removed.  We changed gloves & redraped the patient per colon SSI prevention protocol.  We aspirated the sterile irrigation.  Hemostasis was good.  Sterile unused instruments were used from this point.  I closed the skin at the port sites using Monocryl stitch and sterile dressing.  We assured hemostasis and the former ostomy wound.  Wound irrigated.  I closed the posterior rectus fascia with 0 Vicryl suture.   Anterior rectus fascia was closed using #1 PDS transversely.  Sterile dressing placed.   Patient is being extubated go to recovery room. I had discussed postop care with the patient in detail the office & in the holding area. Instructions are written. I discussed operative findings, updated the patient's status, discussed probable steps to recovery, and gave postoperative recommendations to the patient's daughter, Dierdre Searles .  Recommendations were made.  Questions were answered.  She expressed understanding & appreciation.  Adin Hector, M.D., F.A.C.S. Gastrointestinal and Minimally Invasive Surgery [facility] [address] [place], [place] [address] [phone] Main / Paging

## 2022-07-27 ENCOUNTER — Encounter (HOSPITAL_COMMUNITY): Payer: Self-pay | Admitting: Surgery

## 2022-07-27 DIAGNOSIS — N182 Chronic kidney disease, stage 2 (mild): Secondary | ICD-10-CM

## 2022-07-27 LAB — BASIC METABOLIC PANEL
Anion gap: 13 (ref 5–15)
BUN: 23 mg/dL (ref 8–23)
CO2: 25 mmol/L (ref 22–32)
Calcium: 7.9 mg/dL — ABNORMAL LOW (ref 8.9–10.3)
Chloride: 103 mmol/L (ref 98–111)
Creatinine, Ser: 2.08 mg/dL — ABNORMAL HIGH (ref 0.44–1.00)
GFR, Estimated: 23 mL/min — ABNORMAL LOW (ref >60)
Glucose, Bld: 117 mg/dL — ABNORMAL HIGH (ref 70–99)
Potassium: 2.4 mmol/L — CL (ref 3.5–5.1)
Sodium: 141 mmol/L (ref 135–145)

## 2022-07-27 LAB — CBC
HCT: 29 % — ABNORMAL LOW (ref 36.0–46.0)
Hemoglobin: 9.3 g/dL — ABNORMAL LOW (ref 12.0–15.0)
MCH: 29.2 pg (ref 26.0–34.0)
MCHC: 32.1 g/dL (ref 30.0–36.0)
MCV: 90.9 fL (ref 80.0–100.0)
Platelets: 194 10*3/uL (ref 150–400)
RBC: 3.19 MIL/uL — ABNORMAL LOW (ref 3.87–5.11)
RDW: 13.7 % (ref 11.5–15.5)
WBC: 8.8 10*3/uL (ref 4.0–10.5)
nRBC: 0 % (ref 0.0–0.2)

## 2022-07-27 LAB — MAGNESIUM: Magnesium: 1.6 mg/dL — ABNORMAL LOW (ref 1.7–2.4)

## 2022-07-27 MED ORDER — POTASSIUM CHLORIDE 10 MEQ/100ML IV SOLN
10.0000 meq | INTRAVENOUS | Status: AC
  Administered 2022-07-27 (×4): 10 meq via INTRAVENOUS
  Filled 2022-07-27 (×3): qty 100

## 2022-07-27 MED ORDER — LACTATED RINGERS IV BOLUS
1000.0000 mL | Freq: Once | INTRAVENOUS | Status: AC
  Administered 2022-07-27: 1000 mL via INTRAVENOUS

## 2022-07-27 MED ORDER — SODIUM CHLORIDE 0.9% FLUSH
3.0000 mL | Freq: Two times a day (BID) | INTRAVENOUS | Status: DC
  Administered 2022-07-27 – 2022-07-30 (×6): 3 mL via INTRAVENOUS

## 2022-07-27 MED ORDER — SODIUM CHLORIDE 0.9% FLUSH: 3.0000 mL | INTRAVENOUS | Status: DC | PRN

## 2022-07-27 MED ORDER — MAGNESIUM SULFATE 2 GM/50ML IV SOLN
2.0000 g | Freq: Once | INTRAVENOUS | Status: AC
  Administered 2022-07-27: 2 g via INTRAVENOUS
  Filled 2022-07-27: qty 50

## 2022-07-27 MED ORDER — SODIUM CHLORIDE 0.9 % IV SOLN: 250.0000 mL | INTRAVENOUS | Status: DC | PRN

## 2022-07-27 MED ORDER — POTASSIUM CHLORIDE CRYS ER 20 MEQ PO TBCR
40.0000 meq | EXTENDED_RELEASE_TABLET | Freq: Every day | ORAL | Status: DC
  Administered 2022-07-27: 40 meq via ORAL
  Filled 2022-07-27: qty 2

## 2022-07-27 NOTE — Progress Notes (Signed)
BSE completed, full report to follow.  Pt with functional oropharyngeal swallow ability based on clinical swallow evaluation. Pt denies h/o  or current dysphagia- admits to occasional GERD.  No focal CN deficits - and pt able to feed self with adequate clinical airway protection and swallow.  Defer to surgeon for diet.  No SLP follow up.   Kathleen Lime, MS [facility] SLP Acute Rehab Services Office [phone] Pager [phone]

## 2022-07-27 NOTE — TOC Initial Note (Signed)
Transition of Care [facility]) - Initial/Assessment Note    Patient Details  Name: Alyssa Fernandez MRN: [id number] Date of Birth: 06-22-1941  [facility]) CM/SW Contact:    Lennart Pall, LCSW Phone Number: 07/27/2022, 11:00 AM  Clinical Narrative:                 Met with pt and daughter today to discuss dc planning needs.  Pt reports that she does live alone and is still driving.  She is hopeful she can discharge directly home, however, daughter is concerned about pt's safety.  Daughter is supportive and local, however, does work full time.  Have explained to both the we will await the therapy evaluations/ recommendations to determine if pt may need SNF rehab.  Pt is agreeable with SNF if this is recommended.  Expected Discharge Plan: Skilled Nursing Facility Barriers to Discharge: Continued Medical Work up, Ship broker   Patient Goals and CMS Choice Patient states their goals for this hospitalization and ongoing recovery are:: pt prefers to dc home   Choice offered to / list presented to : Patient, Adult Children  Expected Discharge Plan and Services Expected Discharge Plan: [place] In-house Referral: Clinical Social Work     Living arrangements for the past 2 months: Single Family Home                                      Prior Living Arrangements/Services Living arrangements for the past 2 months: Single Family Home Lives with:: Self Patient language and need for interpreter reviewed:: Yes Do you feel safe going back to the place where you live?: Yes      Need for Family Participation in Patient Care: Yes (Comment) Care giver support system in place?: Yes (comment)   Criminal Activity/Legal Involvement Pertinent to Current Situation/Hospitalization: No - Comment as needed  Activities of Daily Living Home Assistive Devices/Equipment: Eyeglasses, Blood pressure cuff, Grab bars in shower, Shower chair without back, Raised toilet  seat with rails, Hand-held shower hose, Hearing aid ADL Screening (condition at time of admission) Patient's cognitive ability adequate to safely complete daily activities?: No Is the patient deaf or have difficulty hearing?: No Does the patient have difficulty seeing, even when wearing glasses/contacts?: No Does the patient have difficulty concentrating, remembering, or making decisions?: Yes Patient able to express need for assistance with ADLs?: No Does the patient have difficulty dressing or bathing?: Yes Independently performs ADLs?: No Communication: Independent Dressing (OT): Needs assistance Is this a change from baseline?: Pre-admission baseline Grooming: Needs assistance Is this a change from baseline?: Pre-admission baseline Feeding: Independent Bathing: Needs assistance Is this a change from baseline?: Pre-admission baseline Toileting: Needs assistance Is this a change from baseline?: Pre-admission baseline In/Out Bed: Needs assistance Is this a change from baseline?: Pre-admission baseline Walks in Home: Independent Does the patient have difficulty walking or climbing stairs?: Yes Weakness of Legs: Both Weakness of Arms/Hands: Both  Permission Sought/Granted Permission sought to share information with : Family Supports Permission granted to share information with : Yes, Verbal Permission Granted  Share Information with NAME: Dierdre Searles     Permission granted to share info w Relationship: daughter  Permission granted to share info w Contact Information: [phone]  Emotional Assessment Appearance:: Appears stated age Attitude/Demeanor/Rapport: Gracious Affect (typically observed): Flat Orientation: : Oriented to Self, Oriented to Place, Oriented to  Time, Oriented to Situation Alcohol /  Substance Use: Not Applicable Psych Involvement: No (comment)  Admission diagnosis:  Complete rectal prolapse [K62.3] Patient Active Problem List   Diagnosis Date Noted    Rectal prolapse s/p robotic LAR/rectopexy 07/26/2022 07/26/2022   Decreased anal sphincter tone 06/21/2022   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   UTI (urinary tract infection) 03/22/2022   Internal hemorrhoids 03/22/2022   Anxiety and depression 12/14/2021   Epigastric pain 12/14/2021   Lumbar radiculopathy 05/27/2021   Grief 09/27/2020   Penetrating forearm wound, left, sequela 05/26/2020   Pain and swelling of right wrist 11/12/2019   Right wrist pain 11/09/2019   Wheezing 05/08/2019   Osteoporosis 01/30/2018   Atherosclerosis of aorta ([place]) 08/01/2017   CKD (chronic kidney disease) stage 3, GFR 30-59 ml/min (HCC) 08/01/2017   Acute pyelonephritis 04/13/2017   Chronic low back pain 05/04/2016   Easy bruising 12/02/2015   Mild early onset Alzheimer's dementia without behavioral disturbance, psychotic disturbance, mood disturbance, or anxiety ([place]) 09/17/2015   Encounter for immunization 06/03/2015   Fatigue 11/25/2014   Degenerative joint disease 05/26/2014   Hives 04/08/2014   Dyspnea 02/03/2014   Peripheral neuropathy ([place]) 02/05/2013   Bilateral foot pain 11/20/2012   Vaginal itching 06/26/2012   Low blood pressure 06/19/2012   Liver abscess 06/19/2012   Normocytic anemia 05/07/2012   Hypokalemia 05/06/2012   Fever 05/06/2012   Dehydration 05/06/2012   Rash 11/20/2011   Encounter for well adult exam with abnormal findings 11/16/2011   Anxiety 11/16/2011   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   Essential hypertension 02/24/2010   DERMATITIS, ATOPIC 01/20/2010   Edema 12/28/2009   Vitamin D deficiency 11/10/2009   UNS ADVRS EFF OTH RX MEDICINAL&BIOLOGICAL SBSTNC 11/10/2009   TACHYCARDIA 10/05/2009   OTHER POSTSURGICAL STATUS OTHER 07/21/2009   POSTMENOPAUSAL SYNDROME 03/09/2009   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 [place] [address] Phone: [phone] Fax: [phone]     Social Determinants of Health (SDOH) Interventions Food Insecurity Interventions: Intervention Not Indicated Housing Interventions: Intervention Not Indicated Transportation Interventions: Intervention Not Indicated Utilities Interventions: Intervention Not Indicated  Readmission Risk Interventions    07/27/2022   10:56 AM  Readmission Risk Prevention Plan  Transportation Screening Complete  PCP or Specialist Appt within 5-7 Days Complete  Home Care Screening Complete  Medication Review (RN CM) Complete

## 2022-07-27 NOTE — Evaluation (Signed)
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    OSTEOPENIA    Polycystic kidney disease    RENAL FAILURE    Renal failure 02/12/2008   Qualifier: Diagnosis of   By: Niel Hummer MD, Lorinda Creed      VITAMIN D DEFICIENCY    Past Surgical History:  Past Surgical History:  Procedure Laterality Date   ABDOMINAL HYSTERECTOMY     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     bladder operation, but per sling procedure     BUNIONECTOMY     CERVICAL DISC SURGERY     fx rt ankle     left temporal bx artery-negative 2      PARTIAL NEPHRECTOMY Left    percutaneous drainage of liver abscess  05/06/2012   PROCTOSCOPY N/A 07/26/2022   Procedure: RIGID PROCTOSCOPY;  Surgeon: Michael Boston, MD;  Location: [facility];  Service: General;  Laterality: N/A;   RECTOPEXY N/A 07/26/2022   Procedure: RECTOPEXY;  Surgeon: Michael Boston, MD;  Location: [facility];  Service: General;  Laterality: N/A;   HPI:  Alyssa Fernandez , 81 yo female admitted 07/26/22 ,  S/P  ROBOTIC LOW ANTERIOR RECTOSIGMOID Fernandez  -RECTOPEXY  -RIGID PROCTOSCOPY  07/26/22  with the diagnosis of RECTAL PROLAPSE.Alyssa Fernandez PMH  neck fusion, Partial left nephrectomy, renal failure, anxiety, narcolepsy.  Swallow eval ordered by Dr Johney Maine.  pt denies any h/o dysphagia nor reflux.    Assessment / Plan / Recommendation  Clinical Impression  Pt with functional oropharyngeal swallow ability based on clinical  swallow evaluation. Pt denies h/o  or current dysphagia- admits to occasional GERD.  No focal CN deficits - and pt able to feed self with adequate clinical airway protection and swallow.  Defer to surgeon for diet. SLP Visit Diagnosis: Dysphagia, unspecified (R13.10)    Aspiration Risk  No limitations    Diet Recommendation Regular;Thin liquid   Liquid Administration via: Cup;Straw Medication Administration: Whole meds with liquid Supervision: Patient able to self feed Compensations: Slow rate;Small sips/bites Postural Changes: Remain upright for at least 30 minutes after po intake;Seated upright at 90 degrees    Other  Recommendations      Recommendations for follow up therapy are one component of a multi-disciplinary discharge planning process, led by the attending physician.  Recommendations may be updated based on patient status, additional functional criteria and insurance authorization.  Follow up Recommendations No SLP follow up      Assistance Recommended at Discharge  N/a.sp  Functional Status Assessment  N/a  Frequency and Duration     N/a       Prognosis    N/a    Swallow Study   General Date of Onset: 07/27/22 HPI: Alyssa Fernandez , 81 yo female admitted 07/26/22 ,  S/P  ROBOTIC LOW ANTERIOR RECTOSIGMOID Fernandez  -RECTOPEXY  -RIGID PROCTOSCOPY  07/26/22  with the diagnosis of RECTAL PROLAPSE.Alyssa Fernandez PMH  neck fusion, Partial left nephrectomy, renal failure, anxiety, narcolepsy.  Swallow eval ordered by Dr Johney Maine.  pt denies any h/o dysphagia nor reflux. Type of Study: Bedside Swallow Evaluation Diet Prior to this Study: Regular;Thin liquids Temperature Spikes Noted: No Respiratory Status: Room air History of Recent Intubation: No Behavior/Cognition: Alert;Cooperative;Pleasant mood Oral Cavity Assessment: Within Functional Limits Oral Care Completed by SLP: No Oral Cavity - Dentition: Adequate natural dentition Vision: Functional for self-feeding Self-Feeding  Abilities: Able to feed self Patient Positioning: Upright in bed Baseline Vocal Quality: Normal Volitional Cough: Strong Volitional Swallow: Able to elicit    Oral/Motor/Sensory Function Overall Oral Motor/Sensory Function: Within functional limits   Ice Chips Ice chips: Not tested   Thin Liquid Thin Liquid: Within functional limits Presentation: Cup;Self Fed    Nectar Thick Nectar Thick Liquid: Not tested   Honey Thick Honey Thick Liquid: Not tested   Puree Puree: Within functional limits Presentation: Self Fed;Spoon   Solid     Solid: Within functional limits      Macario Golds 07/27/2022,12:12 PM  Kathleen Lime, MS [facility] SLP [place] Office [phone] Pager [phone]

## 2022-07-27 NOTE — Progress Notes (Signed)
Transition of Care (TOC) -30 day Note       Patient Details  Name: Ishana Blades MRN: [id number] Date of Birth:  10-24-1940   [facility]) CM/SW Contact  Name:  Lennart Pall, [place] Phone Number:  [phone] Date:  07/27/2022 Time:  1502   MUST ID: [id number]   To Whom it May Concern:   Please be advised that the above patient will require a short-term nursing home stay, anticipated 30 days or less rehabilitation and strengthening. The plan is for return home.

## 2022-07-27 NOTE — Progress Notes (Signed)
Alyssa Fernandez [id number] [birth year]  CARE TEAM:  PCP: Biagio Borg, MD  Outpatient Care Team: Patient Care Team: Biagio Borg, MD as PCP - General (Internal Medicine) Michael Boston, MD as Consulting Physician (General Surgery) Lavena Bullion, DO as Consulting Physician (Gastroenterology)  Inpatient Treatment Team: Treatment Team: Attending Provider: Michael Boston, MD; Speech Language Pathologist: Fritz Pickerel; Physical Therapist: Fuller Mandril, PT; Occupational Therapist: Lenward Chancellor, OT; Licensed Practical Nurse: Wright, [place] E, LPN; Pharmacist: Suzzanne Cloud, [facility]; Registered Nurse: Jennye Boroughs, RN; Utilization Review: Tressie Stalker, RN   Problem List:   Principal Problem:   Rectal prolapse s/p robotic LAR/rectopexy 07/26/2022 Active Problems:   Mixed Alzheimer's and vascular dementia ([place])   Essential hypertension   GERD   Polycystic kidney   Anxiety   Hypokalemia   Peripheral neuropathy (HCC)   Degenerative joint disease   CKD (chronic kidney disease) stage 3, GFR 30-59 ml/min (HCC)   Anxiety and depression   1 Day Post-Op  07/26/2022  POST-OPERATIVE DIAGNOSIS:   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  Markedly decreased but intact sphincter tone.  No obvious metastatic disease on visceral parietal peritoneum or liver.  Proximal ileal Meckel's diverticulum smooth without any nodularity or atypia nor inflammation.  Left in situ.   The anastomosis rests 12 cm from the anal verge by rigid proctoscopy.  Is a 29 descending  colon to mid rectal EEA anastomosis    Assessment  Recovering relatively well  [facility] Stay = 1 days)  Plan:  -ERAS protocol  -Gradually advance diet.  With her advanced age and some dementia, swallow evaluation to make sure there are no aspiration issues while she is here.  Hopefully not too likely but she is definitely at risk.  -Hypokalemia.  IV and oral replacement and follow  -Hypomagnesemia.  IV replacement and follow.  -Elevated creatinine in the setting of chronic kidney disease.  She is nonoliguric.  Will do 1 IV fluid bolus.  Try to [facility] with boluses as needed.  Try to avoid overhydration in a 81 year old and avoid postop ileus.  -Follow-up on pathology.  Most likely some diverticular disease.  -Kegel pelvic for exercises to help strengthen pelvic floor.  I did caution patient will still have episodes of incontinence to flatus and stool at first but hopefully will start to improve and stabilize now that she is not chronically prolapsed.  -VTE prophylaxis- SCDs, etc  -mobilize as tolerated to help recovery patient is already worked to mobilize.  Will have physical Occupational Therapy see.  Patient's daughter worried that her mother with some dementia and may need to be in a skilled facility for some time to recover.  Will see if that is appropriate or not.  Disposition:  Disposition:  The patient is from: Home  Anticipate discharge to:   TBD  Anticipated Date of Discharge is:  December 8,2023    Barriers to discharge:  Transitions of Care, Therapy assessment & Recommendations pending, and Pending Clinical improvement (more likely than not)  Patient currently is NOT MEDICALLY STABLE for discharge from the hospital from a surgery standpoint.  I reviewed nursing notes, last 24 h vitals and pain scores, last 48 h intake and output, last 24 h labs and trends, and last 24 h imaging results. I have reviewed this patient's available data, including medical  history, events of note, test results, etc as part of my evaluation.  A significant portion of that time was spent in counseling.  Care during the described time interval was provided by me.  This care required moderate level of medical decision making.  07/27/2022    Subjective: (Chief complaint)  No major events overnight.  Daughter at bedside.  Patient denies much pain.  Already mobilize.  Tolerated some liquids.  Objective:  Vital signs:  Vitals:   07/26/22 1754 07/26/22 2056 07/27/22 0202 07/27/22 0554  BP: 99/76 (!) 113/59 (!) 103/59 110/64  Pulse:  70 63 63  Resp:  '18 18 18  '$ Temp:  98.8 F (37.1 C) 99.1 F (37.3 C) 98.5 F (36.9 C)  TempSrc:  Oral Oral Oral  SpO2:  97% 94% 92%  Weight:      Height:           Intake/Output   Yesterday:  12/06 0701 - 12/07 0700 In: 2055 [P.O.:480; I.V.:1475; IV Piggyback:100] Out: 1910 [Urine:1600; Drains:210; Blood:100] This shift:  No intake/output data recorded.  Bowel function:  Flatus: YES  BM:  No  Drain: Serosanguinous   Physical Exam:  General: Pt awake/alert in no acute distress Eyes: PERRL, normal EOM.  Sclera clear.  No icterus Neuro: CN II-XII intact w/o focal sensory/motor deficits. Lymph: No head/neck/groin lymphadenopathy Psych:  No delerium/psychosis/paranoia.  Oriented x 4 HENT: Normocephalic, Mucus membranes moist.  No thrush Neck: Supple, No tracheal deviation.  No obvious thyromegaly Chest: No pain to chest wall compression.  Good respiratory excursion.  No audible wheezing CV:  Pulses intact.  Regular rhythm.  No major extremity edema MS: Normal AROM mjr joints.  No obvious deformity  Abdomen: Dressings clean dry and intact.  Soft.  Nondistended.  Nontender.  No evidence of peritonitis.  No incarcerated hernias.  Ext:   No deformity.  No mjr edema.  No cyanosis Skin: No petechiae / purpurea.  No major sores.  Warm and dry    Results:   Cultures: No results found for this or any  previous visit (from the past 720 hour(s)).  Labs: Results for orders placed or performed during the hospital encounter of 07/26/22 (from the past 48 hour(s))  Basic metabolic panel     Status: Abnormal   Collection Time: 07/27/22  4:48 AM  Result Value Ref Range   Sodium 141 135 - 145 mmol/L   Potassium 2.4 (LL) 3.5 - 5.1 mmol/L    Comment: CRITICAL RESULT CALLED TO, READ BACK BY AND VERIFIED WITH RAGAAS,N RN @ 0616 ON [id number] BY MAHMOUD,S CR    Chloride 103 98 - 111 mmol/L   CO2 25 22 - 32 mmol/L   Glucose, Bld 117 (H) 70 - 99 mg/dL    Comment: Glucose reference range applies only to samples taken after fasting for at least 8 hours.   BUN 23 8 - 23 mg/dL   Creatinine, Ser 2.08 (H) 0.44 - 1.00 mg/dL   Calcium 7.9 (L) 8.9 - 10.3 mg/dL   GFR, Estimated 23 (L) >60 mL/min    Comment: (NOTE) Calculated using the CKD-EPI Creatinine Equation (2021)    Anion gap 13 5 - 15    Comment: Performed at [facility], [address] [place], [place]  [address]  CBC     Status: Abnormal   Collection Time: 07/27/22  4:48 AM  Result Value Ref Range   WBC 8.8 4.0 - 10.5 K/uL   RBC 3.19 (L) 3.87 - 5.11 MIL/uL   Hemoglobin 9.3 (L) 12.0 - 15.0 g/dL   HCT 29.0 (L) 36.0 - 46.0 %   MCV 90.9 80.0 - 100.0 fL   MCH 29.2 26.0 - 34.0 pg   MCHC 32.1 30.0 - 36.0 g/dL   RDW 13.7 11.5 - 15.5 %   Platelets 194 150 - 400 K/uL   nRBC 0.0 0.0 - 0.2 %    Comment: Performed at [facility], [place] [address] [place], [place] [address]  Magnesium     Status: Abnormal   Collection Time: 07/27/22  4:48 AM  Result Value Ref Range   Magnesium 1.6 (L) 1.7 - 2.4 mg/dL    Comment: Performed at [facility], [place] [address] [place], [place] [address]    Imaging / Studies: No results found.  Medications / Allergies: per chart  Antibiotics: Anti-infectives (From admission, onward)    Start     Dose/Rate Route Frequency Ordered Stop   07/26/22 2100  cefoTEtan  (CEFOTAN) 2 g in sodium chloride 0.9 % 100 mL IVPB        2 g 200 mL/hr over 30 Minutes Intravenous Every 12 hours 07/26/22 1412 07/26/22 2215   07/26/22 1400  neomycin (MYCIFRADIN) tablet 1,000 mg  Status:  Discontinued       See Hyperspace for full Linked Orders Report.   1,000 mg Oral 3 times per day 07/26/22 0620 07/26/22 0624   07/26/22 1400  metroNIDAZOLE (FLAGYL) tablet 1,000 mg  Status:  Discontinued       See Hyperspace for full Linked Orders Report.   1,000 mg Oral 3 times per day 07/26/22 0620 07/26/22 0624   07/26/22 0630  cefoTEtan (CEFOTAN) 2 g in sodium chloride 0.9 % 100 mL IVPB        2 g 200 mL/hr over 30 Minutes Intravenous On call to O.R. 07/26/22 6579 07/26/22 0859         Note: Portions of this report may have been transcribed using voice recognition software. Every effort was made to ensure accuracy; however, inadvertent computerized transcription errors may be present.   Any transcriptional errors that result from this process are unintentional.    Adin Hector, MD, FACS, MASCRS Esophageal, Gastrointestinal & Colorectal Surgery Robotic and Minimally Invasive Surgery  [facility]. [address] [place], [place] [address] [phone] Fax [phone] Main  CONTACT INFORMATION:  Weekday (9AM-5PM): Call [facility] main office at [phone]  Weeknight (5PM-9AM) or Weekend/Holiday: Check [email or web address] (password " [email or web address]") for General Surgery [facility] coverage  (Please, do not use SecureChat as it is not reliable communication to reach operating surgeons for immediate patient care given surgeries/outpatient duties/clinic/cross-coverage/off post-call which would lead to a delay in care.  Epic staff messaging available for outptient concerns, but may not be answered for 48 hours or more).     07/27/2022  7:44 AM

## 2022-07-27 NOTE — NC FL2 (Addendum)
[place] LEVEL OF CARE FORM     IDENTIFICATION  Patient Name: OLLIE DELANO Birthdate: 1941-04-30 Sex: female Admission Date (Current Location): 07/26/2022  [facility] and [place] Number:  Herbalist and Address:  [facility],  [place], [place]      Provider Number: [id number]  Attending Physician Name and Address:  Michael Boston, MD  Relative Name and Phone Number:  daughter, Dierdre Searles  [phone]    Current Level of Care: Hospital Recommended Level of Care: [place] Prior Approval Number:    Date Approved/Denied:   PASRR Number: [id number]  Discharge Plan: SNF    Current Diagnoses: Patient Active Problem List   Diagnosis Date Noted   Rectal prolapse s/p robotic LAR/rectopexy 07/26/2022 07/26/2022   Decreased anal sphincter tone 06/21/2022   Mixed Alzheimer's and vascular dementia ([place]) 05/11/2022   UTI (urinary tract infection) 03/22/2022   Internal hemorrhoids 03/22/2022   Anxiety and depression 12/14/2021   Epigastric pain 12/14/2021   Lumbar radiculopathy 05/27/2021   Grief 09/27/2020   Penetrating forearm wound, left, sequela 05/26/2020   Pain and swelling of right wrist 11/12/2019   Right wrist pain 11/09/2019   Wheezing 05/08/2019   Osteoporosis 01/30/2018   Atherosclerosis of aorta ([place]) 08/01/2017   CKD (chronic kidney disease) stage 3, GFR 30-59 ml/min ([place]) 08/01/2017   Acute pyelonephritis 04/13/2017   Chronic low back pain 05/04/2016   Easy bruising 12/02/2015   Mild early onset Alzheimer's dementia without behavioral disturbance, psychotic disturbance, mood disturbance, or anxiety ([place]) 09/17/2015   Encounter for immunization 06/03/2015   Fatigue 11/25/2014   Degenerative joint disease 05/26/2014   Hives 04/08/2014   Dyspnea 02/03/2014   Peripheral neuropathy ([place]) 02/05/2013   Bilateral foot pain 11/20/2012   Vaginal itching 06/26/2012   Low blood pressure 06/19/2012    Liver abscess 06/19/2012   Normocytic anemia 05/07/2012   Hypokalemia 05/06/2012   Fever 05/06/2012   Dehydration 05/06/2012   Rash 11/20/2011   Encounter for well adult exam with abnormal findings 11/16/2011   Anxiety 11/16/2011   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   Essential hypertension 02/24/2010   DERMATITIS, ATOPIC 01/20/2010   Edema 12/28/2009   Vitamin D deficiency 11/10/2009   UNS ADVRS EFF OTH RX MEDICINAL&BIOLOGICAL SBSTNC 11/10/2009   TACHYCARDIA 10/05/2009   OTHER POSTSURGICAL STATUS OTHER 07/21/2009   POSTMENOPAUSAL SYNDROME 03/09/2009   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/17/2008   INSOMNIA 06/17/2008   Hypothyroidism 02/12/2008   Polycystic kidney 02/12/2008   GERD 06/12/2007   Disorder of bone and cartilage 06/12/2007   Hyperlipidemia 03/25/2007    Orientation RESPIRATION BLADDER Height & Weight     Self, Time, Situation, Place  Normal Continent Weight: 134 lb 14.7 oz (61.2 kg) Height:  '5\' 3"'$  (160 cm)  BEHAVIORAL SYMPTOMS/MOOD NEUROLOGICAL BOWEL NUTRITION STATUS      Continent    AMBULATORY STATUS COMMUNICATION OF NEEDS Skin   Limited Assist Verbally Other (Comment) (surgical incision only)                       Personal Care Assistance Level of Assistance  Bathing, Dressing Bathing Assistance: Limited assistance   Dressing Assistance: Limited assistance     Functional Limitations Info             SPECIAL CARE FACTORS FREQUENCY  PT (By licensed PT), OT (By licensed OT)     PT Frequency: 5x/wk OT Frequency: 5x/wk  Contractures Contractures Info: Not present    Additional Factors Info  Code Status, Allergies, Psychotropic Code Status Info: Full Allergies Info: Codeine, Shellfish Allergy, Sulfonamide Derivatives, Zocor (Simvastatin) Psychotropic Info: see MAR         Current Medications (07/28/2022):  This is the current hospital active medication list Current Facility-Administered  Medications  Medication Dose Route Frequency Provider Last Rate Last Admin   0.9 %  sodium chloride infusion  250 mL Intravenous PRN Gross, Remo Lipps, MD       0.9 %  sodium chloride infusion  250 mL Intravenous PRN Gross, Remo Lipps, MD       acetaminophen (TYLENOL) tablet 1,000 mg  1,000 mg Oral Lajuana Ripple, MD   1,000 mg at 07/28/22 1355   ALPRAZolam (XANAX) tablet 0.5 mg  0.5 mg Oral BID PRN Michael Boston, MD       alum & mag hydroxide-simeth (MAALOX/MYLANTA) 200-200-20 MG/5ML suspension 30 mL  30 mL Oral Q6H PRN Michael Boston, MD       alvimopan (ENTEREG) capsule 12 mg  12 mg Oral BID Michael Boston, MD   12 mg at 07/28/22 1046   amLODipine (NORVASC) tablet 5 mg  5 mg Oral Daily Michael Boston, MD   5 mg at 07/28/22 1047   ARIPiprazole (ABILIFY) tablet 5 mg  5 mg Oral Daily Michael Boston, MD   5 mg at 07/28/22 1046   cyanocobalamin (VITAMIN B12) tablet 1,000 mcg  1,000 mcg Oral Daily Michael Boston, MD   1,000 mcg at 07/28/22 1047   diphenhydrAMINE (BENADRYL) 12.5 MG/5ML elixir 12.5 mg  12.5 mg Oral Q6H PRN Michael Boston, MD       Or   diphenhydrAMINE (BENADRYL) injection 12.5 mg  12.5 mg Intravenous Q6H PRN Michael Boston, MD       donepezil (ARICEPT) tablet 10 mg  10 mg Oral Ardeen Fillers, MD   10 mg at 07/27/22 2115   enoxaparin (LOVENOX) injection 30 mg  30 mg Subcutaneous Q24H Michael Boston, MD   30 mg at 07/28/22 8889   feeding supplement (ENSURE SURGERY) liquid 237 mL  237 mL Oral BID BM Michael Boston, MD   237 mL at 07/28/22 1047   hydrALAZINE (APRESOLINE) injection 10 mg  10 mg Intravenous Q2H PRN Michael Boston, MD       HYDROmorphone (DILAUDID) injection 0.5-2 mg  0.5-2 mg Intravenous Q4H PRN Michael Boston, MD   1 mg at 07/26/22 1606   irbesartan (AVAPRO) tablet 150 mg  150 mg Oral Daily Michael Boston, MD   150 mg at 07/28/22 1047   labetalol (NORMODYNE) tablet 100 mg  100 mg Oral TID Michael Boston, MD   100 mg at 07/28/22 1517   lip balm (CARMEX) ointment   Topical BID Michael Boston, MD   Given at 07/28/22 1049   magic mouthwash  15 mL Oral QID PRN Michael Boston, MD       melatonin tablet 3 mg  3 mg Oral QHS PRN Michael Boston, MD       methocarbamol (ROBAXIN) tablet 500 mg  500 mg Oral Q6H PRN Michael Boston, MD   500 mg at 07/27/22 2115   Or   methocarbamol (ROBAXIN) 500 mg in dextrose 5 % 50 mL IVPB  500 mg Intravenous Q6H PRN Michael Boston, MD       metoprolol tartrate (LOPRESSOR) injection 5 mg  5 mg Intravenous Q6H PRN Michael Boston, MD       metoprolol tartrate (LOPRESSOR) injection 5  mg  5 mg Intravenous Q6H PRN Michael Boston, MD       ondansetron [facility]) tablet 4 mg  4 mg Oral Q6H PRN Michael Boston, MD       Or   ondansetron [facility]) injection 4 mg  4 mg Intravenous Q6H PRN Michael Boston, MD   4 mg at 07/28/22 1232   polycarbophil (FIBERCON) tablet 625 mg  625 mg Oral BID Michael Boston, MD   625 mg at 07/28/22 1046   potassium chloride SA (KLOR-CON M) CR tablet 40 mEq  40 mEq Oral BID Michael Boston, MD   40 mEq at 07/28/22 1046   prochlorperazine (COMPAZINE) tablet 10 mg  10 mg Oral Q6H PRN Michael Boston, MD       Or   prochlorperazine (COMPAZINE) injection 5-10 mg  5-10 mg Intravenous Q6H PRN Michael Boston, MD       rosuvastatin (CRESTOR) tablet 20 mg  20 mg Oral Daily Michael Boston, MD   20 mg at 07/28/22 1046   sertraline (ZOLOFT) tablet 100 mg  100 mg Oral Daily Michael Boston, MD   100 mg at 07/28/22 1047   simethicone (MYLICON) chewable tablet 40 mg  40 mg Oral Q6H PRN Michael Boston, MD       sodium chloride flush (NS) 0.9 % injection 3 mL  3 mL Intravenous Gorden Harms, MD   3 mL at 07/27/22 2117   sodium chloride flush (NS) 0.9 % injection 3 mL  3 mL Intravenous PRN Michael Boston, MD       sodium chloride flush (NS) 0.9 % injection 3 mL  3 mL Intravenous Gorden Harms, MD   3 mL at 07/28/22 1049   sodium chloride flush (NS) 0.9 % injection 3 mL  3 mL Intravenous PRN Michael Boston, MD       traMADol Veatrice [place]) tablet 50-100 mg  50-100 mg Oral  Q6H PRN Michael Boston, MD   50 mg at 07/28/22 1234     Discharge Medications: Please see discharge summary for a list of discharge medications.  Relevant Imaging Results:  Relevant Lab Results:   Additional Information SS# [id number]  Lennart Pall, LCSW

## 2022-07-27 NOTE — Evaluation (Signed)
Occupational Therapy Evaluation Patient Details Name: Alyssa Fernandez MRN: [id number] DOB: Mar 02, 1941 Today's Date: 07/27/2022   History of Present Illness Alyssa Fernandez , 81 yo female admitted 07/26/22 ,  S/P  ROBOTIC LOW ANTERIOR RECTOSIGMOID RESECTION  -RECTOPEXY  -RIGID PROCTOSCOPY  07/26/22  with the diagnosis of RECTAL PROLAPSE.Marland Kitchen PMH  neck fusion, Partial left nephrectomy, renal failure, anxiety, narcolepsy   Clinical Impression   Alyssa Fernandez presents with pain and decreased activity tolerance s/p surgery. On evaluation she is min guard to supervision for ambulation needing a walker to bear down through due to discomfort. She is predominantly set up for Adl and min assist for lower body tasks. Se is typically independent and ambulate 2 miles a day with breaks. Today she is needing increased assistance for ADLs and has no assistance at home to manage IADLs. Recommend discharge to short term rehab prior to return home.      Recommendations for follow up therapy are one component of a multi-disciplinary discharge planning process, led by the attending physician.  Recommendations may be updated based on patient status, additional functional criteria and insurance authorization.   Follow Up Recommendations  Skilled nursing-short term rehab (<3 hours/day)     Assistance Recommended at Discharge Intermittent Supervision/Assistance  Patient can return home with the following A little help with bathing/dressing/bathroom;Assistance with cooking/housework    Functional Status Assessment  Patient has had a recent decline in their functional status and demonstrates the ability to make significant improvements in function in a reasonable and predictable amount of time.  Equipment Recommendations  None recommended by OT    Recommendations for Other Services       Precautions / Restrictions Precautions Precautions: Fall Precaution Comments: right drain Restrictions Weight Bearing  Restrictions: No      Mobility Bed Mobility                    Transfers                          Balance Overall balance assessment: Mild deficits observed, not formally tested                                         ADL either performed or assessed with clinical judgement   ADL Overall ADL's : Needs assistance/impaired Eating/Feeding: Independent   Grooming: Supervision/safety;Standing;Oral care   Upper Body Bathing: Set up;Sitting   Lower Body Bathing: Minimal assistance;Sit to/from stand   Upper Body Dressing : Set up;Sitting   Lower Body Dressing: Minimal assistance;Sit to/from stand   Toilet Transfer: Supervision/safety;Regular Toilet;Rolling walker (2 wheels)   Toileting- Water quality scientist and Hygiene: Supervision/safety;Sit to/from stand       Functional mobility during ADLs: Min guard;Rolling walker (2 wheels) General ADL Comments: Min guard for ambulation. O2 sat 94% on RA     Vision Patient Visual Report: No change from baseline       Perception     Praxis      Pertinent Vitals/Pain Pain Assessment Pain Assessment: 0-10 Pain Score: 8  Pain Location: abdomen Pain Descriptors / Indicators: Sore Pain Intervention(s): Limited activity within patient's tolerance     Hand Dominance Right   Extremity/Trunk Assessment Upper Extremity Assessment Upper Extremity Assessment: Overall WFL for tasks assessed   Lower Extremity Assessment Lower Extremity Assessment: Defer to PT  evaluation   Cervical / Trunk Assessment Cervical / Trunk Assessment: Normal   Communication Communication Communication: No difficulties   Cognition Arousal/Alertness: Awake/alert Behavior During Therapy: WFL for tasks assessed/performed Overall Cognitive Status: Within Functional Limits for tasks assessed                                       General Comments       Exercises     Shoulder Instructions       Home Living Family/patient expects to be discharged to:: Unsure Living Arrangements: Alone   Type of Home: House Home Access: Stairs to enter Entrance Stairs-Number of Steps: 1   Home Layout: Two level;Able to live on main level with bedroom/bathroom     Bathroom Shower/Tub: Occupational psychologist: Handicapped height     Home Equipment: None   Additional Comments: walks 2 miles a day - with a break, has Chartered certified accountant, daughter works      Prior Functioning/Environment Prior Level of Function : Independent/Modified Independent             Mobility Comments: no device, drives ADLs Comments: independent        OT Problem List: Decreased activity tolerance;Pain;Decreased knowledge of use of DME or AE;Decreased knowledge of precautions      OT Treatment/Interventions: Self-care/ADL training;DME and/or AE instruction;Therapeutic activities;Patient/family education;Therapeutic exercise    OT Goals(Current goals can be found in the care plan section) Acute Rehab OT Goals Patient Stated Goal: less pain OT Goal Formulation: With patient Time For Goal Achievement: 08/10/22 Potential to Achieve Goals: Good  OT Frequency: Min 2X/week    Co-evaluation              AM-PAC OT "6 Clicks" Daily Activity     Outcome Measure Help from another person eating meals?: None Help from another person taking care of personal grooming?: A Little Help from another person toileting, which includes using toliet, bedpan, or urinal?: A Little Help from another person bathing (including washing, rinsing, drying)?: A Little Help from another person to put on and taking off regular upper body clothing?: A Little Help from another person to put on and taking off regular lower body clothing?: A Little 6 Click Score: 19   End of Session Nurse Communication: Mobility status  Activity Tolerance: Patient limited by pain Patient left: in chair;with call bell/phone within  reach  OT Visit Diagnosis: Pain                Time: 7915-0569 OT Time Calculation (min): 17 min Charges:  OT General Charges $OT Visit: 1 Visit OT Evaluation $OT Eval Low Complexity: 1 Low  Gustavo Lah, OTR/L [place]  Office [phone]   Lenward Chancellor 07/27/2022, 12:44 PM

## 2022-07-27 NOTE — Evaluation (Signed)
Physical Therapy Evaluation Patient Details Name: Alyssa Fernandez MRN: [id number] DOB: Nov 19, 1940 Today's Date: 07/27/2022  History of Present Illness  Alyssa Fernandez , 81 yo female admitted 07/26/22 ,  S/P  ROBOTIC LOW ANTERIOR RECTOSIGMOID RESECTION  -RECTOPEXY  -RIGID PROCTOSCOPY  07/26/22  with the diagnosis of RECTAL PROLAPSE.Alyssa Fernandez PMH  neck fusion, Partial left nephrectomy, renal failure, anxiety, narcolepsy  Clinical Impression  Pt admitted with above diagnosis.  Pt currently with functional limitations due to the deficits listed below (see PT Problem List). Pt will benefit from skilled PT to increase their independence and safety with mobility to allow discharge to the venue listed below.   The patient reporting 8/10 abdominal pain, RN notified. Patient lived alone, drove and active PTA. Currently requires  RW and  1 assistance  and limited by complaints of pain.  Continue PT for progressive  ambulation.        Recommendations for follow up therapy are one component of a multi-disciplinary discharge planning process, led by the attending physician.  Recommendations may be updated based on patient status, additional functional criteria and insurance authorization.  Follow Up Recommendations Skilled nursing-short term rehab (<3 hours/day) Can patient physically be transported by private vehicle: Yes    Assistance Recommended at Discharge Frequent or constant Supervision/Assistance  Patient can return home with the following  A little help with walking and/or transfers;A little help with bathing/dressing/bathroom;Help with stairs or ramp for entrance;Assistance with cooking/housework;Assist for transportation    Equipment Recommendations Rolling walker (2 wheels)  Recommendations for Other Services       Functional Status Assessment Patient has had a recent decline in their functional status and demonstrates the ability to make significant improvements in function in a reasonable and  predictable amount of time.     Precautions / Restrictions Precautions Precautions: Fall Precaution Comments: right drain      Mobility  Bed Mobility               General bed mobility comments: standing with OT    Transfers                   General transfer comment: na    Ambulation/Gait Ambulation/Gait assistance: Min assist, Min guard Gait Distance (Feet): 60 Feet Assistive device: Rolling walker (2 wheels) Gait Pattern/deviations: Step-through pattern Gait velocity: decr     General Gait Details: cues for safety  and turns and use of RW  Stairs            Wheelchair Mobility    Modified Rankin (Stroke Patients Only)       Balance Overall balance assessment: Mild deficits observed, not formally tested                                           Pertinent Vitals/Pain Pain Assessment Pain Score: 8  Pain Location: abdomen Pain Descriptors / Indicators: Sore Pain Intervention(s): Monitored during session    Home Living Family/patient expects to be discharged to:: Unsure Living Arrangements: Alone   Type of Home: House Home Access: Stairs to enter   [employer] of Steps: 1   Home Layout: Two level;Able to live on main level with bedroom/bathroom Home Equipment: None Additional Comments: walks 2 miles a day - with a break, has Chartered certified accountant, daughter works    Prior Function Prior Level of Function : Independent/Modified Independent  Mobility Comments: no device, drives ADLs Comments: independent     Hand Dominance   Dominant Hand: Right    Extremity/Trunk Assessment        Lower Extremity Assessment Lower Extremity Assessment: Overall WFL for tasks assessed    Cervical / Trunk Assessment Cervical / Trunk Assessment: Normal  Communication   Communication: No difficulties  Cognition Arousal/Alertness: Awake/alert Behavior During Therapy: WFL for tasks  assessed/performed Overall Cognitive Status: Within Functional Limits for tasks assessed                                          General Comments      Exercises     Assessment/Plan    PT Assessment Patient needs continued PT services  PT Problem List Decreased mobility;Pain;Decreased activity tolerance       PT Treatment Interventions DME instruction;Therapeutic activities;Gait training;Therapeutic exercise;Functional mobility training    PT Goals (Current goals can be found in the Care Plan section)  Acute Rehab PT Goals Patient Stated Goal: go home PT Goal Formulation: With patient Time For Goal Achievement: 08/10/22 Potential to Achieve Goals: Good    Frequency Min 3X/week     Co-evaluation               AM-PAC PT "6 Clicks" Mobility  Outcome Measure Help needed turning from your back to your side while in a flat bed without using bedrails?: A Little Help needed moving from lying on your back to sitting on the side of a flat bed without using bedrails?: A Little Help needed moving to and from a bed to a chair (including a wheelchair)?: A Little Help needed standing up from a chair using your arms (e.g., wheelchair or bedside chair)?: A Little Help needed to walk in hospital room?: A Little Help needed climbing 3-5 steps with a railing? : A Lot 6 Click Score: 17    End of Session   Activity Tolerance: Patient limited by pain Patient left: in chair;with call bell/phone within reach Nurse Communication: Mobility status PT Visit Diagnosis: Unsteadiness on feet (R26.81);Difficulty in walking, not elsewhere classified (R26.2);Pain    Time: 8101-7510 PT Time Calculation (min) (ACUTE ONLY): 12 min   Charges:   PT Evaluation $PT Eval Low Complexity: 1 Low          [place] Office [phone] Weekend [phone]   Claretha Cooper 07/27/2022, 11:52 AM

## 2022-07-28 LAB — POTASSIUM: Potassium: 2.7 mmol/L — CL (ref 3.5–5.1)

## 2022-07-28 LAB — CREATININE, SERUM
Creatinine, Ser: 1.15 mg/dL — ABNORMAL HIGH (ref 0.44–1.00)
GFR, Estimated: 47 mL/min — ABNORMAL LOW (ref >60)

## 2022-07-28 LAB — SURGICAL PATHOLOGY

## 2022-07-28 LAB — HEMOGLOBIN: Hemoglobin: 7.8 g/dL — ABNORMAL LOW (ref 12.0–15.0)

## 2022-07-28 MED ORDER — POTASSIUM CHLORIDE CRYS ER 20 MEQ PO TBCR
40.0000 meq | EXTENDED_RELEASE_TABLET | Freq: Two times a day (BID) | ORAL | Status: DC
  Administered 2022-07-28 (×2): 40 meq via ORAL
  Filled 2022-07-28 (×2): qty 2

## 2022-07-28 MED ORDER — POTASSIUM CHLORIDE 10 MEQ/100ML IV SOLN
10.0000 meq | INTRAVENOUS | Status: AC
  Administered 2022-07-28 (×4): 10 meq via INTRAVENOUS
  Filled 2022-07-28 (×4): qty 100

## 2022-07-28 NOTE — Progress Notes (Signed)
Date and time results received: 07/28/22 at 0540  Test: Potassium Critical Value: 2.4  Name of Provider Notified: Michaelle Birks, MD   Orders Received? Or Actions Taken?: On-Call MD Zenia Resides) made aware of Critical Lab and no order given at this time. On-Call MD Zenia Resides) reports they will take care of it.

## 2022-07-28 NOTE — TOC Progression Note (Signed)
Transition of Care [facility]) - Progression Note    Patient Details  Name: REYANNA BALEY MRN: [id number] Date of Birth: [birth year]  [facility] CM/SW Contact  Lennart Pall, LCSW Phone Number: 07/28/2022, 1:29 PM  Clinical Narrative:    Have reviewed SNF bed offers with pt and daughter and they have accepted bed at [facility].  Have received insurance auth # [id number] good for 7 days. Await contact from MD about medical readiness of pt.     Expected Discharge Plan: [place] Barriers to Discharge: Continued Medical Work up, Ship broker  Expected Discharge Plan and Services Expected Discharge Plan: [place] In-house Referral: Clinical Social Work     Living arrangements for the past 2 months: Single Family Home                                       Social Determinants of Health (SDOH) Interventions Food Insecurity Interventions: Intervention Not Indicated Housing Interventions: Intervention Not Indicated Transportation Interventions: Intervention Not Indicated Utilities Interventions: Intervention Not Indicated  Readmission Risk Interventions    07/27/2022   10:56 AM  Readmission Risk Prevention Plan  Transportation Screening Complete  PCP or Specialist Appt within 5-7 Days Complete  Home Care Screening Complete  Medication Review (RN CM) Complete

## 2022-07-28 NOTE — Progress Notes (Signed)
Initial Nutrition Assessment  DOCUMENTATION CODES:   Not applicable  INTERVENTION:  - Heart healthy diet per MD.  - Ensure Surgery BID per ERAS protocol, each supplement provides 330 calories and 18g protein. - Encourage intake as tolerated. - Monitor weight trends.    NUTRITION DIAGNOSIS:   Increased nutrient needs related to acute illness as evidenced by estimated needs.  GOAL:   Patient will meet greater than or equal to 90% of their needs  MONITOR:   PO intake, Weight trends, Supplement acceptance, Labs  REASON FOR ASSESSMENT:   Malnutrition Screening Tool    ASSESSMENT:   81 y.o. female with PMH significant for CKD, HTN, and HLD who presented with rectal prolapse, now s/p rectosigmoid resection on 12/6.   Patient endorses a UBW of 138# around a year ago and weight loss that was intentional earlier this year. Per EMR, pt had a 20# or 14% weight loss from March to August (significant for 5 month time frame). However she again reiterates this was an intentional weight loss. Her weight is noted to have no significant changes since that time. Patient shares she typically eats 3 meals a day at home, food recall as below: Breakfast: cereal Lunch: sandwich and fruit Dinner: homemade meal  However, patient admits over the past month she hasn't been eating well due to feeling sick. Thankfully, she has been drinking 1 Ensure a day the past month to account for eating less.  Patient notes her current appetite is about the same but she is trying to eat 3 meals a day. She is documented to being consuming 50-100% of meals. She is currently ordered Ensure Surgery as part of ERAS protocol and notes she likes it and will continue to drink.  Medications reviewed and include: Vitamin B12, K+ replacement  Labs reviewed:  K+ 2.7 Creatinine 1.17   NUTRITION - FOCUSED PHYSICAL EXAM:  Flowsheet Row Most Recent Value  Orbital Region No depletion  Upper Arm Region No depletion   Thoracic and Lumbar Region No depletion  Buccal Region Mild depletion  Temple Region Mild depletion  Clavicle Bone Region Moderate depletion  Clavicle and Acromion Bone Region Mild depletion  Scapular Bone Region Unable to assess  Dorsal Hand Mild depletion  Patellar Region Mild depletion  Anterior Thigh Region Mild depletion  Posterior Calf Region No depletion  Edema (RD Assessment) None  Hair Reviewed  Eyes Reviewed  Mouth Reviewed  Skin Reviewed  Nails Reviewed       Diet Order:   Diet Order             Diet Heart Room service appropriate? Yes; Fluid consistency: Thin  Diet effective now           Diet - low sodium heart healthy                   EDUCATION NEEDS:  Education needs have been addressed  Skin:  Skin Assessment: Skin Integrity Issues: Skin Integrity Issues:: Incisions Incisions: Abdomen  Last BM:  12/5  Height:  Ht Readings from Last 1 Encounters:  07/26/22 '5\' 3"'$  (1.6 m)   Weight:  07/26/22 57 kg    BMI:  Body mass index is 22.27 kg/m.  Estimated Nutritional Needs:  Kcal:  1600-1700 kcals Protein:  60-75 grams Fluid:  >/= 1.6L    Samson Frederic RD, LDN For contact information, refer to [facility].

## 2022-07-28 NOTE — Progress Notes (Signed)
Mobility Specialist - Progress Note   07/28/22 1556  Mobility  Activity Ambulated with assistance in hallway  Level of Assistance Standby assist, set-up cues, supervision of patient - no hands on  Assistive Device Front wheel walker  Distance Ambulated (ft) 260 ft  Activity Response Tolerated well  Mobility Referral Yes  $Mobility charge 1 Mobility   Pt received in bed and agreeable to mobility. No complaints during mobility. Pt to bed after session with all needs met.    [facility]

## 2022-07-28 NOTE — Progress Notes (Signed)
Alyssa Fernandez [id number] [birth year]  CARE TEAM:  PCP: Biagio Borg, MD  Outpatient Care Team: Patient Care Team: Biagio Borg, MD as PCP - General (Internal Medicine) Michael Boston, MD as Consulting Physician (General Surgery) Lavena Bullion, DO as Consulting Physician (Gastroenterology)  Inpatient Treatment Team: Treatment Team: Attending Provider: Michael Boston, MD; Technician: Leda Quail, NT   Problem List:   Principal Problem:   Rectal prolapse s/p robotic LAR/rectopexy 07/26/2022 Active Problems:   Mixed Alzheimer's and vascular dementia [facility])   Essential hypertension   GERD   Polycystic kidney   Anxiety   Hypokalemia   Peripheral neuropathy (HCC)   Degenerative joint disease   CKD (chronic kidney disease) stage 3, GFR 30-59 ml/min (HCC)   Anxiety and depression   2 Days Post-Op  07/26/2022  POST-OPERATIVE DIAGNOSIS:   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  Markedly decreased but intact sphincter tone.  No obvious metastatic disease on visceral parietal peritoneum or liver.  Proximal ileal Meckel's diverticulum smooth without any nodularity or atypia nor inflammation.  Left in situ.   The anastomosis rests 12 cm from the anal verge by rigid proctoscopy.  Is a 29 descending colon to mid rectal EEA anastomosis    Assessment  Recovering gradually [facility] Stay = 2 days)  Plan:  -ERAS protocol  -Gradually advance diet.  Formally evaluate by speech therapy.  They see no concerns of dysphagia or aspiration.  Advance to solid  diet.  -Hypokalemia.  Improved somewhat but still low.  Give extra IV and oral dose today.  IV and oral replacement and follow  -Hypomagnesemia.  IV replacement and follow.  -Elevated creatinine in the setting of chronic kidney disease.  Normalized already.  Wean off oxygen as tolerated.  Hold off on diuresis given her chronic kidney disease.  -Follow-up on pathology.  Most likely some diverticular disease.  -Kegel pelvic for exercises to help strengthen pelvic floor.  I did caution patient will still have episodes of incontinence to flatus and stool at first but hopefully will start to improve and stabilize now that she is not chronically prolapsed.  -VTE prophylaxis- SCDs, etc  -mobilize as tolerated to help recovery patient is already worked to mobilize.  Will have physical Occupational Therapy see.  Patient's daughter worried that her mother with some dementia and may need to be in a skilled facility for some time to recover.  Will see if that is appropriate or not.  Disposition:  Disposition:  The patient is from: Home  Anticipate discharge to:  [place] facility per patient's daughter.  Therapy agrees.  Anticipated Date of Discharge is:  December 9,2023    Barriers to discharge:  Transitions of Care, Therapy assessment & Recommendations pending, and Pending Clinical improvement (more likely than not)  Patient currently is NOT MEDICALLY STABLE for discharge from the hospital from a surgery standpoint.      I reviewed nursing notes, last 24 h vitals and pain scores, last 48 h intake and output, last 24 h labs and trends, and last 24 h imaging results. I have reviewed this patient's available data, including medical history, events of  note, test results, etc as part of my evaluation.  A significant portion of that time was spent in counseling.  Care during the described time interval was provided by me.  This care required moderate level of medical decision making.   07/28/2022    Subjective: (Chief complaint)  No major events overnight.  Daughter at bedside.  Patient denies much pain.  Already mobilize.  Tolerated some liquids.  Objective:  Vital signs:  Vitals:   07/27/22 1308 07/27/22 2108 07/28/22 0500 07/28/22 0502  BP: 120/66 (!) 144/74  (!) 151/78  Pulse: 77 77  65  Resp: '17 18  17  '$ Temp: 97.6 F (36.4 C) 98.5 F (36.9 C)  98.8 F (37.1 C)  TempSrc: Oral Oral  Oral  SpO2:  92%  97%  Weight:   61.2 kg   Height:        Last BM Date : 07/25/22  Intake/Output   Yesterday:  12/07 0701 - 12/08 0700 In: 872.3 [P.O.:480; I.V.:6; IV Piggyback:386.3] Out: 1460 [Urine:1350; Drains:110] This shift:  Total I/O In: 246 [P.O.:240; I.V.:6] Out: 910 [Urine:850; Drains:60]  Bowel function:  Flatus: YES  BM:  No  Drain: Serosanguinous   Physical Exam:  General: Pt awake/alert in no acute distress Eyes: PERRL, normal EOM.  Sclera clear.  No icterus Neuro: CN II-XII intact w/o focal sensory/motor deficits. Lymph: No head/neck/groin lymphadenopathy Psych:  No delerium/psychosis/paranoia.  Oriented x 4 HENT: Normocephalic, Mucus membranes moist.  No thrush Neck: Supple, No tracheal deviation.  No obvious thyromegaly Chest: No pain to chest wall compression.  Good respiratory excursion.  No audible wheezing CV:  Pulses intact.  Regular rhythm.  No major extremity edema MS: Normal AROM mjr joints.  No obvious deformity  Abdomen: Dressings clean dry and intact.  Soft.  Nondistended.  Nontender.  No evidence of peritonitis.  No incarcerated hernias.  Ext:   No deformity.  No mjr edema.  No cyanosis Skin: No petechiae / purpurea.  No major sores.  Warm and dry    Results:   Cultures: No results found for this or any previous visit (from the past 720 hour(s)).  Labs: Results for orders placed or performed during the hospital encounter of 07/26/22 (from the past 48 hour(s))  Basic metabolic panel     Status: Abnormal    Collection Time: 07/27/22  4:48 AM  Result Value Ref Range   Sodium 141 135 - 145 mmol/L   Potassium 2.4 (LL) 3.5 - 5.1 mmol/L    Comment: CRITICAL RESULT CALLED TO, READ BACK BY AND VERIFIED WITH RAGAAS,N RN @ 0616 ON [id number] BY MAHMOUD,S CR    Chloride 103 98 - 111 mmol/L   CO2 25 22 - 32 mmol/L   Glucose, Bld 117 (H) 70 - 99 mg/dL    Comment: Glucose reference range applies only to samples taken after fasting for at least 8 hours.   BUN 23 8 - 23 mg/dL   Creatinine, Ser 2.08 (H) 0.44 - 1.00 mg/dL   Calcium 7.9 (L) 8.9 - 10.3 mg/dL   GFR, Estimated 23 (L) >60 mL/min    Comment: (NOTE) Calculated using the CKD-EPI Creatinine Equation (2021)    Anion gap 13 5 - 15    Comment: Performed at [facility], [place] [address] [place], [place] [address]  CBC     Status: Abnormal   Collection Time: 07/27/22  4:48 AM  Result Value Ref Range   WBC 8.8 4.0 - 10.5 K/uL  RBC 3.19 (L) 3.87 - 5.11 MIL/uL   Hemoglobin 9.3 (L) 12.0 - 15.0 g/dL   HCT 29.0 (L) 36.0 - 46.0 %   MCV 90.9 80.0 - 100.0 fL   MCH 29.2 26.0 - 34.0 pg   MCHC 32.1 30.0 - 36.0 g/dL   RDW 13.7 11.5 - 15.5 %   Platelets 194 150 - 400 K/uL   nRBC 0.0 0.0 - 0.2 %    Comment: Performed at [facility], [place] [address] [place], [place] [address]  Magnesium     Status: Abnormal   Collection Time: 07/27/22  4:48 AM  Result Value Ref Range   Magnesium 1.6 (L) 1.7 - 2.4 mg/dL    Comment: Performed at [facility], [place] [address], [place], [place] [address]  Potassium     Status: Abnormal   Collection Time: 07/28/22  4:35 AM  Result Value Ref Range   Potassium 2.7 (LL) 3.5 - 5.1 mmol/L    Comment: CRITICAL RESULT CALLED TO, READ BACK BY AND VERIFIED WITH JONES,K AT 0535 ON 07/28/22 BY VAZQUEZJ Performed at [facility], [place] [address], [place], [place] [address]   Hemoglobin     Status: Abnormal   Collection Time: 07/28/22  4:35 AM  Result Value Ref  Range   Hemoglobin 7.8 (L) 12.0 - 15.0 g/dL    Comment: Performed at [facility], [place] [address], [place], [place] [address]  Creatinine, serum     Status: Abnormal   Collection Time: 07/28/22  4:35 AM  Result Value Ref Range   Creatinine, Ser 1.15 (H) 0.44 - 1.00 mg/dL   GFR, Estimated 47 (L) >60 mL/min    Comment: (NOTE) Calculated using the CKD-EPI Creatinine Equation (2021) Performed at [facility], [place] [address], [place], [place] [address]     Imaging / Studies: No results found.  Medications / Allergies: per chart  Antibiotics: Anti-infectives (From admission, onward)    Start     Dose/Rate Route Frequency Ordered Stop   07/26/22 2100  cefoTEtan (CEFOTAN) 2 g in sodium chloride 0.9 % 100 mL IVPB        2 g 200 mL/hr over 30 Minutes Intravenous Every 12 hours 07/26/22 1412 07/26/22 2215   07/26/22 1400  neomycin (MYCIFRADIN) tablet 1,000 mg  Status:  Discontinued       See Hyperspace for full Linked Orders Report.   1,000 mg Oral 3 times per day 07/26/22 0620 07/26/22 0624   07/26/22 1400  metroNIDAZOLE (FLAGYL) tablet 1,000 mg  Status:  Discontinued       See Hyperspace for full Linked Orders Report.   1,000 mg Oral 3 times per day 07/26/22 0620 07/26/22 0624   07/26/22 0630  cefoTEtan (CEFOTAN) 2 g in sodium chloride 0.9 % 100 mL IVPB        2 g 200 mL/hr over 30 Minutes Intravenous On call to O.R. 07/26/22 4970 07/26/22 0859         Note: Portions of this report may have been transcribed using voice recognition software. Every effort was made to ensure accuracy; however, inadvertent computerized transcription errors may be present.   Any transcriptional errors that result from this process are unintentional.    Adin Hector, MD, FACS, MASCRS Esophageal, Gastrointestinal & Colorectal Surgery Robotic and Minimally Invasive Surgery  [facility]. [address], [place], [place] [address] [phone] Fax [phone]  Main  CONTACT INFORMATION:  Weekday (9AM-5PM): Call [facility]CS main office at [phone]  Weeknight (5PM-9AM) or Weekend/Holiday: Check [email or web address] (password " [email or web address]") for General Surgery [facility] coverage  (Please, do not use SecureChat as it is not reliable communication to reach operating surgeons for immediate patient care given surgeries/outpatient duties/clinic/cross-coverage/off post-call which would lead to a delay in care.  Epic staff messaging available for outptient concerns, but may not be answered for 48 hours or more).     07/28/2022  6:49 AM

## 2022-07-28 NOTE — Progress Notes (Signed)
Mobility Specialist - Progress Note   07/28/22 1116  Mobility  Activity Ambulated with assistance in hallway;Ambulated with assistance to bathroom  Level of Assistance Standby assist, set-up cues, supervision of patient - no hands on  Assistive Device Front wheel walker  Distance Ambulated (ft) 260 ft  Activity Response Tolerated well  Mobility Referral Yes  $Mobility charge 1 Mobility   Pt received in bed and agreeable to mobility. No complaints during mobility. Pt to bed after session with all needs met & bed alarm turned back on.    [facility]

## 2022-07-29 LAB — HEMOGLOBIN: Hemoglobin: 7.1 g/dL — ABNORMAL LOW (ref 12.0–15.0)

## 2022-07-29 LAB — POTASSIUM: Potassium: 4 mmol/L (ref 3.5–5.1)

## 2022-07-29 LAB — CREATININE, SERUM
Creatinine, Ser: 0.89 mg/dL (ref 0.44–1.00)
GFR, Estimated: >60 mL/min (ref >60)

## 2022-07-29 MED ORDER — POTASSIUM CHLORIDE CRYS ER 20 MEQ PO TBCR
40.0000 meq | EXTENDED_RELEASE_TABLET | Freq: Every day | ORAL | Status: DC
  Administered 2022-07-29 – 2022-07-31 (×3): 40 meq via ORAL
  Filled 2022-07-29 (×3): qty 2

## 2022-07-29 MED ORDER — BISMUTH SUBSALICYLATE 262 MG/15ML PO SUSP
30.0000 mL | Freq: Once | ORAL | Status: AC
  Administered 2022-07-29: 30 mL via ORAL
  Filled 2022-07-29: qty 236

## 2022-07-29 NOTE — TOC Progression Note (Signed)
Transition of Care [facility]) - Progression Note    Patient Details  Name: Alyssa Fernandez MRN: [id number] Date of Birth: 02-Feb-1941  Transition [facility]) CM/SW Contact  Henrietta Dine, RN Phone Number: 07/29/2022, 1:37 PM  Clinical Narrative:    Hulen Skains by Narda Rutherford, Intake at [facility] regarding pt d/c today; notified her that pt not medically stable for d/c and plan for ? d/c on Monday.    Expected Discharge Plan: [place] Barriers to Discharge: Continued Medical Work up, Ship broker  Expected Discharge Plan and Services Expected Discharge Plan: [place] In-house Referral: Clinical Social Work     Living arrangements for the past 2 months: Single Family Home                                       Social Determinants of Health (SDOH) Interventions Food Insecurity Interventions: Intervention Not Indicated Housing Interventions: Intervention Not Indicated Transportation Interventions: Intervention Not Indicated Utilities Interventions: Intervention Not Indicated  Readmission Risk Interventions    07/27/2022   10:56 AM  Readmission Risk Prevention Plan  Transportation Screening Complete  PCP or Specialist Appt within 5-7 Days Complete  Home Care Screening Complete  Medication Review (RN CM) Complete

## 2022-07-29 NOTE — Progress Notes (Signed)
DESTANE SPEAS [id number] 1940-11-20  CARE TEAM:  PCP: Biagio Borg, MD  Outpatient Care Team: Patient Care Team: Biagio Borg, MD as PCP - General (Internal Medicine) Michael Boston, MD as Consulting Physician (General Surgery) Lavena Bullion, DO as Consulting Physician (Gastroenterology)  Inpatient Treatment Team: Treatment Team: Attending Provider: Michael Boston, MD; Mobility Specialist: Trevor Mace Charge Nurse: Heloise Ochoa, RN; Technician: Constance Goltz, NT; Registered Nurse: Jonnie Finner, RN; Utilization Review: Alanson Puls, RN   Problem List:   Principal Problem:   Rectal prolapse s/p robotic LAR/rectopexy 07/26/2022 Active Problems:   Mixed Alzheimer's and vascular dementia [facility])   Essential hypertension   GERD   Polycystic kidney   Anxiety   Hypokalemia   Peripheral neuropathy (HCC)   Degenerative joint disease   CKD (chronic kidney disease) stage 3, GFR 30-59 ml/min (HCC)   Anxiety and depression   3 Days Post-Op  07/26/2022  POST-OPERATIVE DIAGNOSIS:   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  Markedly decreased but intact sphincter tone.  No obvious metastatic disease on visceral parietal peritoneum or liver.  Proximal ileal Meckel's diverticulum smooth without any nodularity or atypia nor inflammation.  Left in situ.   The anastomosis rests 12 cm from the anal verge by rigid proctoscopy.  Is a 29 descending colon to mid rectal EEA anastomosis    Assessment  Recovering gradually [facility] Stay = 3  days)  Plan:  Most likely got cleaned out and finally had decent volume of bowel movements.  Continue soluble fiber.  Encourage solid diet.  Do 1 dose of Pepto to gently constipate and thickened.  Hold off on loperamide as that may be too aggressive.  -ERAS protocol  -Soild diet.  Formally evaluated by speech therapy.  They see no concerns of dysphagia or aspiration.    -Hypokalemia.  Improved with aggressive potassium supplementation.  Go down to just routine daily supplementation for now and follow   -Hypomagnesemia.  IV replacement and follow.  -Elevated creatinine in the setting of chronic kidney disease.  Normalized already.  -Oxygen as needed.  Weaned off.  Hold off on diuresis given her chronic kidney disease.  -Follow-up on pathology -benign tissue.  No abnormals polyp cancer tumor..  Most likely some diverticular disease.  -Kegel pelvic for exercises to help strengthen pelvic floor.  I did caution patient will still have episodes of incontinence to flatus and stool at first but hopefully will start to improve and stabilize now that she is not chronically prolapsed.  -VTE prophylaxis- SCDs, etc  -Mobilize as tolerated to help recovery patient is already worked to mobilize.  Will have physical Occupational Therapy see.  Patient's daughter worried that her mother with some dementia and may need to be in a skilled facility for some time to recover.  Therapies agree.  FL 2 filled out.  Looks like [name] may be receptive to having the patient soon.  Disposition:  Disposition:  The patient is from: Home  Anticipate discharge to:  [place] facility per patient's daughter.  Therapy agrees.  Anticipated Date of Discharge is:  December 10,2023    Barriers  to discharge:  Transitions of Care, Therapy assessment & Recommendations pending, and Pending Clinical improvement (more likely than not)  Patient currently is NOT MEDICALLY STABLE for discharge from the hospital from a  surgery standpoint.      I reviewed nursing notes, last 24 h vitals and pain scores, last 48 h intake and output, last 24 h labs and trends, and last 24 h imaging results. I have reviewed this patient's available data, including medical history, events of note, test results, etc as part of my evaluation.  A significant portion of that time was spent in counseling.  Care during the described time interval was provided by me.  This care required moderate level of medical decision making.  07/29/2022    Subjective: (Chief complaint)  A lot of loose bowel movements last night with incontinence.  Feels discouraged.  No nausea or vomiting.  No severe pain.  Weaned off oxygen.  Getting up to commode.  Worked with physical therapy a little bit.  Objective:  Vital signs:  Vitals:   07/28/22 0502 07/28/22 1335 07/28/22 2131 07/29/22 0511  BP: (!) 151/78 (!) 150/80 128/80 (!) 160/88  Pulse: 65 73 83 94  Resp: 17 (!) '22 16 16  '$ Temp: 98.8 F (37.1 C) 98.5 F (36.9 C) 98.5 F (36.9 C) 98.3 F (36.8 C)  TempSrc: Oral Oral Oral Oral  SpO2: 97% 98% 93% 94%  Weight:      Height:        Last BM Date : 07/25/22  Intake/Output   Yesterday:  12/08 0701 - 12/09 0700 In: 1000 [P.O.:600; IV Piggyback:400] Out: 2094 [Urine:1400; Drains:95] This shift:  No intake/output data recorded.  Bowel function:  Flatus: YES  BM:  YES  Drain: Serosanguinous   Physical Exam:  General: Pt awake/alert in no acute distress Eyes: PERRL, normal EOM.  Sclera clear.  No icterus Neuro: CN II-XII intact w/o focal sensory/motor deficits. Lymph: No head/neck/groin lymphadenopathy Psych:  No delerium/psychosis/paranoia.  Oriented x 4 HENT: Normocephalic, Mucus membranes moist.  No thrush Neck: Supple, No tracheal deviation.  No obvious thyromegaly Chest: No pain to chest wall compression.  Good respiratory excursion.  No audible wheezing CV:  Pulses intact.  Regular rhythm.  No major extremity  edema MS: Normal AROM mjr joints.  No obvious deformity  Abdomen: Dressings clean dry and intact.  Soft.  Nondistended.  Nontender.  No evidence of peritonitis.  No incarcerated hernias.  Ext:   No deformity.  No mjr edema.  No cyanosis Skin: No petechiae / purpurea.  No major sores.  Warm and dry    Results:   Cultures: No results found for this or any previous visit (from the past 720 hour(s)).  Labs: Results for orders placed or performed during the hospital encounter of 07/26/22 (from the past 48 hour(s))  Potassium     Status: Abnormal   Collection Time: 07/28/22  4:35 AM  Result Value Ref Range   Potassium 2.7 (LL) 3.5 - 5.1 mmol/L    Comment: CRITICAL RESULT CALLED TO, READ BACK BY AND VERIFIED WITH JONES,K AT 0535 ON 07/28/22 BY VAZQUEZJ Performed at [facility], [place] [address], [place], [place] [address]   Hemoglobin     Status: Abnormal   Collection Time: 07/28/22  4:35 AM  Result Value Ref Range   Hemoglobin 7.8 (L) 12.0 - 15.0 g/dL    Comment: Performed at [facility], [place] [address], [place], [place] [address]  Creatinine, serum  Status: Abnormal   Collection Time: 07/28/22  4:35 AM  Result Value Ref Range   Creatinine, Ser 1.15 (H) 0.44 - 1.00 mg/dL   GFR, Estimated 47 (L) >60 mL/min    Comment: (NOTE) Calculated using the CKD-EPI Creatinine Equation (2021) Performed at [facility], [place] [address], [place], [place] [address]   Potassium     Status: None   Collection Time: 07/29/22  5:23 AM  Result Value Ref Range   Potassium 4.0 3.5 - 5.1 mmol/L    Comment: Performed at [facility], [place] [address], [place], [place] [address]  Hemoglobin     Status: Abnormal   Collection Time: 07/29/22  5:23 AM  Result Value Ref Range   Hemoglobin 7.1 (L) 12.0 - 15.0 g/dL    Comment: Performed at [facility], [place] [address], [place], [place] [address]  Creatinine, serum      Status: None   Collection Time: 07/29/22  5:23 AM  Result Value Ref Range   Creatinine, Ser 0.89 0.44 - 1.00 mg/dL   GFR, Estimated >60 >60 mL/min    Comment: (NOTE) Calculated using the CKD-EPI Creatinine Equation (2021) Performed at [facility], [place] [address], [place], [place] [address]     Imaging / Studies: No results found.  Medications / Allergies: per chart  Antibiotics: Anti-infectives (From admission, onward)    Start     Dose/Rate Route Frequency Ordered Stop   07/26/22 2100  cefoTEtan (CEFOTAN) 2 g in sodium chloride 0.9 % 100 mL IVPB        2 g 200 mL/hr over 30 Minutes Intravenous Every 12 hours 07/26/22 1412 07/26/22 2215   07/26/22 1400  neomycin (MYCIFRADIN) tablet 1,000 mg  Status:  Discontinued       See Hyperspace for full Linked Orders Report.   1,000 mg Oral 3 times per day 07/26/22 0620 07/26/22 0624   07/26/22 1400  metroNIDAZOLE (FLAGYL) tablet 1,000 mg  Status:  Discontinued       See Hyperspace for full Linked Orders Report.   1,000 mg Oral 3 times per day 07/26/22 0620 07/26/22 0624   07/26/22 0630  cefoTEtan (CEFOTAN) 2 g in sodium chloride 0.9 % 100 mL IVPB        2 g 200 mL/hr over 30 Minutes Intravenous On call to O.R. 07/26/22 7106 07/26/22 0859         Note: Portions of this report may have been transcribed using voice recognition software. Every effort was made to ensure accuracy; however, inadvertent computerized transcription errors may be present.   Any transcriptional errors that result from this process are unintentional.    Adin Hector, MD, FACS, MASCRS Esophageal, Gastrointestinal & Colorectal Surgery Robotic and Minimally Invasive Surgery  [facility]. [address] [place], [place] [address] [phone] Fax [phone] Main  CONTACT INFORMATION:  Weekday (9AM-5PM): Call [facility] main office at [phone]  Weeknight (5PM-9AM)  or Weekend/Holiday: Check [email or web address] (password " [email or web address]") for General Surgery [facility] coverage  (Please, do not use SecureChat as it is not reliable communication to reach operating surgeons for immediate patient care given surgeries/outpatient duties/clinic/cross-coverage/off post-call which would lead to a delay in care.  Epic staff messaging available for outptient concerns, but may not be answered for 48 hours or more).     07/29/2022  7:46 AM

## 2022-07-29 NOTE — Progress Notes (Addendum)
Mobility Specialist - Progress Note   07/29/22 1253  Mobility  Activity Ambulated with assistance in hallway  Level of Assistance Standby assist, set-up cues, supervision of patient - no hands on  Assistive Device Front wheel walker  Distance Ambulated (ft) 350 ft  Activity Response Tolerated well  Mobility Referral Yes  $Mobility charge 1 Mobility   Pt received in bed and agreeable to mobility. No complaints during mobility. Pt to bed after session with all needs met, call bell in reach & bed alarm turned back on.  Operating Room Services

## 2022-07-30 LAB — HEMOGLOBIN: Hemoglobin: 6.4 g/dL — CL (ref 12.0–15.0)

## 2022-07-30 LAB — ABO/RH: ABO/RH(D): O POS

## 2022-07-30 LAB — POTASSIUM: Potassium: 4.2 mmol/L (ref 3.5–5.1)

## 2022-07-30 LAB — CREATININE, SERUM
Creatinine, Ser: 0.83 mg/dL (ref 0.44–1.00)
GFR, Estimated: >60 mL/min (ref >60)

## 2022-07-30 LAB — PREPARE RBC (CROSSMATCH)

## 2022-07-30 MED ORDER — SODIUM CHLORIDE 0.9 % IV SOLN
125.0000 mg | Freq: Once | INTRAVENOUS | Status: AC
  Administered 2022-07-30: 125 mg via INTRAVENOUS
  Filled 2022-07-30: qty 10

## 2022-07-30 MED ORDER — SODIUM CHLORIDE 0.9% IV SOLUTION: Freq: Once | INTRAVENOUS | Status: AC

## 2022-07-30 MED ORDER — VITAMIN C 500 MG PO TABS
500.0000 mg | ORAL_TABLET | Freq: Two times a day (BID) | ORAL | Status: DC
  Administered 2022-07-30 – 2022-07-31 (×3): 500 mg via ORAL
  Filled 2022-07-30 (×3): qty 1

## 2022-07-30 MED ORDER — SODIUM CHLORIDE 0.9% IV SOLUTION: Freq: Once | INTRAVENOUS | Status: DC

## 2022-07-30 MED ORDER — TAB-A-VITE/IRON PO TABS
1.0000 | ORAL_TABLET | Freq: Every day | ORAL | Status: DC
  Administered 2022-07-30 – 2022-07-31 (×2): 1 via ORAL
  Filled 2022-07-30 (×2): qty 1

## 2022-07-30 NOTE — Plan of Care (Signed)

## 2022-07-30 NOTE — Progress Notes (Signed)
   07/30/22 0520  Assess: MEWS Score  Temp 97.8 F (36.6 C)  BP (!) 172/85  MAP (mmHg) 106  Pulse Rate 76  Resp 16  SpO2 98 %  O2 Device Room Air  Assess: MEWS Score  MEWS Temp 0  MEWS Systolic 0  MEWS Pulse 0  MEWS RR 0  MEWS LOC 0  MEWS Score 0  MEWS Score Color Green  Provider Notification  Provider Name/Title Dr. Johney [place]  Date Provider Notified 07/30/22  Time Provider Notified [id number]  Method of Notification Page  Notification Reason Critical Result (Hgb - 6.4)  Provider response See new orders  Date of Provider Response 07/30/22  Time of Provider Response 0530  Document  Progress note created (see row info) Yes  Assess: SIRS CRITERIA  SIRS Temperature  0  SIRS Pulse 0  SIRS Respirations  0  SIRS WBC 1  SIRS Score Sum  1

## 2022-07-30 NOTE — Progress Notes (Signed)
Alyssa Fernandez [id number] 1940/12/27  CARE TEAM:  PCP: Biagio Borg, MD  Outpatient Care Team: Patient Care Team: Biagio Borg, MD as PCP - General (Internal Medicine) Michael Boston, MD as Consulting Physician (General Surgery) Lavena Bullion, DO as Consulting Physician (Gastroenterology)  Inpatient Treatment Team: Treatment Team: Attending Provider: Michael Boston, MD; Mobility Specialist: Trevor Mace; Utilization Review: Erlenmeyer, Areta Haber, RN; Technician: Constance Goltz, NT   Problem List:   Principal Problem:   Rectal prolapse s/p robotic LAR/rectopexy 07/26/2022 Active Problems:   Mixed Alzheimer's and vascular dementia [name]   Essential hypertension   GERD   Polycystic kidney   Anxiety   Hypokalemia   Peripheral neuropathy (HCC)   Degenerative joint disease   CKD (chronic kidney disease) stage 3, GFR 30-59 ml/min (HCC)   Anxiety and depression   4 Days Post-Op  07/26/2022  POST-OPERATIVE DIAGNOSIS:   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  Markedly decreased but intact sphincter tone.  No obvious metastatic disease on visceral parietal peritoneum or liver.  Proximal ileal Meckel's diverticulum smooth without any nodularity or atypia nor inflammation.  Left in situ.   The anastomosis rests 12 cm from the anal verge by rigid proctoscopy.  Is a 29 descending colon to mid rectal EEA anastomosis    Assessment  Recovering gradually  ABLA on top of IDA [name] Stay = 4 days)  Plan:  -Bowel regimen.  5.  Iron.  Solid diet.  Do 1 dose of  Pepto to gently constipate and thickened.  Hold off on loperamide as that may be too aggressive.  -ERAS protocol  -ABLA with IDA - no major drain output nor hematochezia.  HD stable but follow.  Transfuse 1U pRBCs.  IV iron.  Oral iron & vitC.  Hold chemical VTE proph  -Soild diet.  Formally evaluated by speech therapy.  They see no concerns of dysphagia or aspiration.    -Hypokalemia.  Improved with aggressive potassium supplementation.  Stable with  routine daily supplementation for now and follow   -Hypomagnesemia.  IV replaced.  -Elevated creatinine in the setting of chronic kidney disease.  Normalized already.  -Oxygen as needed.  Weaned off.  Hold off on diuresis given her chronic kidney disease.  -Pathology benign.  No abnormal polyp cancer tumor.  Mild diverticulosis w/o -itis  -Kegel pelvic for exercises to help strengthen pelvic floor.  I did caution patient will still have episodes of incontinence to flatus and stool at first but hopefully will start to improve and stabilize now that she is not chronically prolapsed.  -VTE prophylaxis- SCDs.  Hold enoxaparin w falling Hgb <7  -Mobilize as tolerated to help recovery patient is already worked to mobilize.  Will have physical Occupational Therapy see.  Patient's daughter worried that her mother with some dementia and may need to be in a skilled facility for some time to recover.  Therapies agree.  FL 2 filled out.  Looks like [name] may be receptive to having the patient soon.  Disposition:  Disposition:  The patient is from: Home  Anticipate discharge to:  [name] per patient's daughter.  Therapy agrees.  Anticipated Date of Discharge is:  July 31, 2022    Barriers to discharge:  Transitions of Care, Therapy assessment & Recommendations pending, and Pending Clinical improvement (more likely than not)  Patient currently is NOT MEDICALLY STABLE for discharge from the hospital from a surgery  standpoint.      I reviewed nursing notes, last 24 h vitals and pain scores, last 48 h intake and output, last 24 h labs and trends, and last 24 h imaging results. I have reviewed this patient's available data, including medical history, events of note, test results, etc as part of my evaluation.  A significant portion of that time was spent in counseling.  Care during the described time interval was provided by me.  This care required moderate level of medical decision making.  07/30/2022    Subjective: (Chief complaint)  Bowels little better controlled.  Drifting hemoglobin.  Being transfused.  Pain better controlled.  Objective:  Vital signs:  Vitals:   07/29/22 1343 07/29/22 2033 07/30/22 0500 07/30/22 0520  BP: 138/67 125/68  (!) 172/85  Pulse: 81 78  76  Resp: '18 16  16  '$ Temp: 98.6 F (37 C) 98 F (36.7 C)  97.8 F (36.6 C)  TempSrc: Oral Oral  Oral  SpO2: 96% 96%  98%  Weight:   61.5 kg 58.9 kg  Height:        Last BM Date : 07/29/22  Intake/Output   Yesterday:  12/09 0701 - 12/10 0700 In: 360 [P.O.:360] Out: 25 [Drains:25] This shift:  No intake/output data recorded.  Bowel function:  Flatus: YES  BM:  YES  Drain: Serosanguinous   Physical Exam:  General: Pt awake/alert in no acute distress Eyes: PERRL, normal EOM.  Sclera clear.  No icterus Neuro: CN II-XII intact w/o focal sensory/motor deficits. Lymph: No head/neck/groin lymphadenopathy Psych:  No delerium/psychosis/paranoia.  Oriented x 4 HENT: Normocephalic, Mucus membranes moist.  No thrush Neck: Supple, No tracheal deviation.  No obvious thyromegaly Chest: No pain to chest wall compression.  Good respiratory excursion.  No audible wheezing CV:  Pulses intact.  Regular rhythm.  No major extremity edema MS: Normal AROM mjr joints.  No obvious deformity  Abdomen: Dressings clean dry and intact.  Soft.  Nondistended.  Nontender.  Visions clean dry intact.  Mild ecchymosis at  Pfannenstiel incision only.  No bleeding.  No evidence of peritonitis.  No incarcerated hernias.  Ext:   No deformity.  No mjr edema.  No cyanosis Skin: No petechiae / purpurea.  No major sores.  Warm and dry    Results:   Cultures: No results found for this or any previous visit (from the past 720 hour(s)).  Labs: Results for orders placed or performed during the hospital encounter of 07/26/22 (from the past 48 hour(s))  Potassium     Status: None   Collection Time: 07/29/22  5:23 AM  Result Value Ref Range   Potassium 4.0 3.5 - 5.1 mmol/L    Comment: Performed at [facility], [place] [address] [place], [place] [address]  Hemoglobin     Status: Abnormal   Collection Time: 07/29/22  5:23 AM  Result Value Ref Range   Hemoglobin 7.1 (L) 12.0 - 15.0 g/dL    Comment: Performed at [facility], [place] [address], [place], [place] [address]  Creatinine, serum     Status: None   Collection Time: 07/29/22  5:23 AM  Result Value Ref Range  Creatinine, Ser 0.89 0.44 - 1.00 mg/dL   GFR, Estimated >60 >60 mL/min    Comment: (NOTE) Calculated using the CKD-EPI Creatinine Equation (2021) Performed at [facility], [place] [address], [place], [place] [address]   Potassium     Status: None   Collection Time: 07/30/22  4:21 AM  Result Value Ref Range   Potassium 4.2 3.5 - 5.1 mmol/L    Comment: Performed at [facility], [place] [address], [place], [place] [address]  Hemoglobin     Status: Abnormal   Collection Time: 07/30/22  4:21 AM  Result Value Ref Range   Hemoglobin 6.4 (LL) 12.0 - 15.0 g/dL    Comment: REPEATED TO VERIFY THIS CRITICAL RESULT HAS VERIFIED AND BEEN CALLED TO JASON MILLER, RN BY MEGAN HAYES ON 12 10 2023 AT 0517, AND HAS BEEN READ BACK. CRITICAL RESULT VERIFIED Performed at [facility], [place] [address] [place], [place] [address]   Creatinine, serum     Status: None   Collection  Time: 07/30/22  4:21 AM  Result Value Ref Range   Creatinine, Ser 0.83 0.44 - 1.00 mg/dL   GFR, Estimated >60 >60 mL/min    Comment: (NOTE) Calculated using the CKD-EPI Creatinine Equation (2021) Performed at [facility], [place] [address] [place], [place] [address]     Imaging / Studies: No results found.  Medications / Allergies: per chart  Antibiotics: Anti-infectives (From admission, onward)    Start     Dose/Rate Route Frequency Ordered Stop   07/26/22 2100  cefoTEtan (CEFOTAN) 2 g in sodium chloride 0.9 % 100 mL IVPB        2 g 200 mL/hr over 30 Minutes Intravenous Every 12 hours 07/26/22 1412 07/26/22 2215   07/26/22 1400  neomycin (MYCIFRADIN) tablet 1,000 mg  Status:  Discontinued       See Hyperspace for full Linked Orders Report.   1,000 mg Oral 3 times per day 07/26/22 0620 07/26/22 0624   07/26/22 1400  metroNIDAZOLE (FLAGYL) tablet 1,000 mg  Status:  Discontinued       See Hyperspace for full Linked Orders Report.   1,000 mg Oral 3 times per day 07/26/22 0620 07/26/22 0624   07/26/22 0630  cefoTEtan (CEFOTAN) 2 g in sodium chloride 0.9 % 100 mL IVPB        2 g 200 mL/hr over 30 Minutes Intravenous On call to O.R. 07/26/22 7824 07/26/22 0859         Note: Portions of this report may have been transcribed using voice recognition software. Every effort was made to ensure accuracy; however, inadvertent computerized transcription errors may be present.   Any transcriptional errors that result from this process are unintentional.    Adin Hector, MD, FACS, MASCRS Esophageal, Gastrointestinal & Colorectal Surgery Robotic and Minimally Invasive Surgery  [place]. [address] [place],  [address] [phone] Fax [phone] Main  CONTACT INFORMATION:  Weekday (9AM-5PM): Call [facility] main office at [phone]  Weeknight (5PM-9AM) or Weekend/Holiday: Check  [email or web address] (password " [email or web address]") for General Surgery [facility] coverage  (Please, do not use SecureChat as it is not reliable communication to reach operating surgeons for immediate patient care given surgeries/outpatient duties/clinic/cross-coverage/off post-call which would lead to a delay in care.  Epic staff messaging available for outptient concerns, but may not be answered for 48 hours or more).     07/30/2022  7:13  AM  

## 2022-07-31 DIAGNOSIS — R2689 Other abnormalities of gait and mobility: Secondary | ICD-10-CM | POA: Diagnosis not present

## 2022-07-31 DIAGNOSIS — E119 Type 2 diabetes mellitus without complications: Secondary | ICD-10-CM | POA: Diagnosis not present

## 2022-07-31 DIAGNOSIS — E559 Vitamin D deficiency, unspecified: Secondary | ICD-10-CM | POA: Diagnosis not present

## 2022-07-31 DIAGNOSIS — R293 Abnormal posture: Secondary | ICD-10-CM | POA: Diagnosis not present

## 2022-07-31 DIAGNOSIS — F0150 Vascular dementia without behavioral disturbance: Secondary | ICD-10-CM | POA: Diagnosis not present

## 2022-07-31 DIAGNOSIS — D62 Acute posthemorrhagic anemia: Secondary | ICD-10-CM | POA: Diagnosis not present

## 2022-07-31 DIAGNOSIS — I10 Essential (primary) hypertension: Secondary | ICD-10-CM | POA: Diagnosis not present

## 2022-07-31 DIAGNOSIS — F0390 Unspecified dementia without behavioral disturbance: Secondary | ICD-10-CM | POA: Diagnosis not present

## 2022-07-31 DIAGNOSIS — K219 Gastro-esophageal reflux disease without esophagitis: Secondary | ICD-10-CM | POA: Diagnosis not present

## 2022-07-31 DIAGNOSIS — F411 Generalized anxiety disorder: Secondary | ICD-10-CM | POA: Diagnosis not present

## 2022-07-31 DIAGNOSIS — K623 Rectal prolapse: Secondary | ICD-10-CM | POA: Diagnosis not present

## 2022-07-31 DIAGNOSIS — F02A0 Dementia in other diseases classified elsewhere, mild, without behavioral disturbance, psychotic disturbance, mood disturbance, and anxiety: Secondary | ICD-10-CM | POA: Diagnosis not present

## 2022-07-31 DIAGNOSIS — D649 Anemia, unspecified: Secondary | ICD-10-CM | POA: Diagnosis not present

## 2022-07-31 DIAGNOSIS — E782 Mixed hyperlipidemia: Secondary | ICD-10-CM | POA: Diagnosis not present

## 2022-07-31 DIAGNOSIS — R41841 Cognitive communication deficit: Secondary | ICD-10-CM | POA: Diagnosis not present

## 2022-07-31 DIAGNOSIS — Q612 Polycystic kidney, adult type: Secondary | ICD-10-CM | POA: Diagnosis not present

## 2022-07-31 DIAGNOSIS — G9009 Other idiopathic peripheral autonomic neuropathy: Secondary | ICD-10-CM | POA: Diagnosis not present

## 2022-07-31 DIAGNOSIS — M6281 Muscle weakness (generalized): Secondary | ICD-10-CM | POA: Diagnosis not present

## 2022-07-31 DIAGNOSIS — N183 Chronic kidney disease, stage 3 unspecified: Secondary | ICD-10-CM | POA: Diagnosis not present

## 2022-07-31 DIAGNOSIS — G309 Alzheimer's disease, unspecified: Secondary | ICD-10-CM | POA: Diagnosis not present

## 2022-07-31 LAB — TYPE AND SCREEN
ABO/RH(D): O POS
Antibody Screen: NEGATIVE
Unit division: 0

## 2022-07-31 LAB — BPAM RBC
Blood Product Expiration Date: 202401022359
ISSUE DATE / TIME: 202312101158
Unit Type and Rh: 5100

## 2022-07-31 LAB — HEMOGLOBIN: Hemoglobin: 8.1 g/dL — ABNORMAL LOW (ref 12.0–15.0)

## 2022-07-31 LAB — POTASSIUM: Potassium: 4.6 mmol/L (ref 3.5–5.1)

## 2022-07-31 MED ORDER — ASCORBIC ACID 500 MG PO TABS: 500.0000 mg | ORAL_TABLET | Freq: Every day | ORAL | 0 refills | Status: AC

## 2022-07-31 MED ORDER — CALCIUM POLYCARBOPHIL 625 MG PO TABS: 625.0000 mg | ORAL_TABLET | Freq: Two times a day (BID) | ORAL | 2 refills | Status: AC

## 2022-07-31 MED ORDER — LOPERAMIDE HCL 2 MG PO CAPS
2.0000 mg | ORAL_CAPSULE | Freq: Once | ORAL | Status: AC
  Administered 2022-07-31: 2 mg via ORAL
  Filled 2022-07-31: qty 1

## 2022-07-31 MED ORDER — FERROUS SULFATE 325 (65 FE) MG PO TABS: 325.0000 mg | ORAL_TABLET | Freq: Every day | ORAL | 2 refills | Status: AC

## 2022-07-31 NOTE — Discharge Summary (Signed)
Physician Discharge Summary    Patient ID: Alyssa Fernandez MRN: [id number] DOB/AGE: 01/14/1941  81 y.o.  Patient Care Team: Biagio Borg, MD as PCP - General (Internal Medicine) Michael Boston, MD as Consulting Physician (General Surgery) Lavena Bullion, DO as Consulting Physician (Gastroenterology)  Admit date: 07/26/2022  Discharge date: 07/31/2022  Hospital Stay = 5 days    Discharge Diagnoses:  Principal Problem:   Rectal prolapse s/p robotic LAR/rectopexy 07/26/2022 Active Problems:   Mixed Alzheimer's and vascular dementia [facility]   Essential hypertension   GERD   Polycystic kidney   Anxiety   Hypokalemia   Peripheral neuropathy (HCC)   Degenerative joint disease   CKD (chronic kidney disease) stage 3, GFR 30-59 ml/min ([place])   Anxiety and depression   5 Days Post-Op  07/26/2022  POST-OPERATIVE DIAGNOSIS:   RECTAL PROLAPSE  SURGERY:  07/26/2022  Procedure(s): ROBOTIC LOW ANTERIOR RECTOSIGMOID RESECTION RECTOPEXY RIGID PROCTOSCOPY  SURGEON:    Surgeon(s): Michael Boston, MD Leighton Ruff, MD  Consults: Case Management / Social Work, Physical Therapy, Occupational Therapy, Speech Therapy, Pharmacy, and Nutrition  Hospital Course:   The patient underwent  the surgery above.  Postoperatively, the patient gradually mobilized and advanced to a solid diet.  Pain and other symptoms were treated aggressively.  Patient did have some drifting hemoglobin on anticoagulation.  She was transfused 1 unit and hemoglobin improved.  Hypokalemia corrected.  Multiple therapies involved.  She seemed to improve and stabilized.  Family wished patient to go to skilled facility for better aggressive rehab options.  Patient had some mild leaking and swelling to her decree sphincter tone but no major incontinence.  Again discussions made to encourage Kegel pelvic floor exercises.  By the time of discharge, the patient was walking well the hallways, eating food, having flatus.   Pain was well-controlled on an oral medications.  Based on meeting discharge criteria and continuing to recover, I felt it was safe for the patient to be discharged from the hospital to further recover with close followup. Postoperative recommendations were discussed in detail.  They are written as well.  Discharged Condition: good  Discharge Exam: Blood pressure (!) 158/90, pulse 79, temperature 98 F (36.7 C), temperature source Oral, resp. rate 16, height '5\' 3"'$  (1.6 m), weight 58.9 kg, SpO2 96 %.  General: Pt awake/alert/oriented x4 in No acute distress Eyes: PERRL, normal EOM.  Sclera clear.  No icterus Neuro: CN II-XII intact w/o focal sensory/motor deficits. Lymph: No head/neck/groin lymphadenopathy Psych:  No delerium/psychosis/paranoia HENT: Normocephalic, Mucus membranes moist.  No thrush Neck: Supple, No tracheal deviation Chest:  No chest wall pain w good excursion CV:  Pulses intact.  Regular rhythm MS: Normal AROM mjr joints.  No obvious deformity Abdomen: Soft.  Nondistended.  Nontender.  Incisions clean dry and intact.  Minimal ecchymosis in lower abdomen.  No evidence of peritonitis.  No incarcerated hernias. GU.  No Foley.  No hematuria. Rectal: No recurrent prolapse.  Still with decreased sphincter tone. Ext:  SCDs BLE.  No mjr edema.  No cyanosis Skin: No petechiae / purpura   Disposition:    Follow-up Information     Michael Boston, MD Follow up on 08/17/2022.   Specialties: General Surgery, Colon and Rectal Surgery Contact information: [address] [address] [phone]                 Discharge disposition: [facility]       Discharge Instructions  Call MD for:   Complete by: As directed    FEVER > 101.5 F  (temperatures < 101.5 F are not significant)   Call MD for:  extreme fatigue   Complete by: As directed    Call MD for:  persistant dizziness or light-headedness   Complete by: As  directed    Call MD for:  persistant nausea and vomiting   Complete by: As directed    Call MD for:  redness, tenderness, or signs of infection (pain, swelling, redness, odor or green/yellow discharge around incision site)   Complete by: As directed    Call MD for:  severe uncontrolled pain   Complete by: As directed    Diet - low sodium heart healthy   Complete by: As directed    Start with a bland diet such as soups, liquids, starchy foods, low fat foods, etc. the first few days at home. Gradually advance to a solid, low-fat, high fiber diet by the end of the first week at home.   Add a fiber supplement to your diet (Metamucil, etc) If you feel full, bloated, or constipated, stay on a full liquid or pureed/blenderized diet for a few days until you feel better and are no longer constipated.   Discharge instructions   Complete by: As directed    See Discharge Instructions If you are not getting better after two weeks or are noticing you are getting worse, contact our office [phone] for further advice.  We may need to adjust your medications, re-evaluate you in the office, send you to the emergency room, or see what other things we can do to help. The clinic staff is available to answer your questions during regular business hours (8:30am-5pm).  Please don't hesitate to call and ask to speak to one of our nurses for clinical concerns.    A surgeon from [facility] is always on call at the hospitals 24 hours/day If you have a medical emergency, go to the nearest emergency room or call 911.   Discharge wound care:   Complete by: As directed    It is good for closed incisions and even open wounds to be washed every day.  Shower every day.  Short baths are fine.  Wash the incisions and wounds clean with soap & water.    You may leave closed incisions open to air if it is dry.   You may cover the incision with clean gauze & replace it after your daily shower for  comfort.  TEGADERM:  You have clear gauze band-aid dressings over your closed incision(s).  Remove the dressings 3 days after surgery = 12/9 Saturday   Driving Restrictions   Complete by: As directed    You may drive when: - you are no longer taking narcotic prescription pain medication - you can comfortably wear a seatbelt - you can safely make sudden turns/stops without pain.   Increase activity slowly   Complete by: As directed    Start light daily activities --- self-care, walking, climbing stairs- beginning the day after surgery.  Gradually increase activities as tolerated.  Control your pain to be active.  Stop when you are tired.  Ideally, walk several times a day, eventually an hour a day.   Most people are back to most day-to-day activities in a few weeks.  It takes 4-6 weeks to get back to unrestricted, intense activity. If you can walk 30 minutes without difficulty, it is safe to try more intense activity  such as jogging, treadmill, bicycling, low-impact aerobics, swimming, etc. Save the most intensive and strenuous activity for last (Usually 4-8 weeks after surgery) such as sit-ups, heavy lifting, contact sports, etc.  Refrain from any intense heavy lifting or straining until you are off narcotics for pain control.  You will have off days, but things should improve week-by-week. DO NOT PUSH THROUGH PAIN.  Let pain be your guide: If it hurts to do something, don't do it.   Lifting restrictions   Complete by: As directed    If you can walk 30 minutes without difficulty, it is safe to try more intense activity such as jogging, treadmill, bicycling, low-impact aerobics, swimming, etc. Save the most intensive and strenuous activity for last (Usually 4-8 weeks after surgery) such as sit-ups, heavy lifting, contact sports, etc.   Refrain from any intense heavy lifting or straining until you are off narcotics for pain control.  You will have off days, but things should improve  week-by-week. DO NOT PUSH THROUGH PAIN.  Let pain be your guide: If it hurts to do something, don't do it.  Pain is your body warning you to avoid that activity for another week until the pain goes down.   May shower / Bathe   Complete by: As directed    May walk up steps   Complete by: As directed    Remove dressing in 72 hours   Complete by: As directed    Make sure all dressings are removed by the third day after surgery.  Leave incisions open to air.  OK to cover incisions with gauze or bandages as desired   Sexual Activity Restrictions   Complete by: As directed    You may have sexual intercourse when it is comfortable. If it hurts to do something, stop.       Allergies as of 07/31/2022       Reactions   Codeine Swelling   Shellfish Allergy Swelling   Throat swells   Sulfonamide Derivatives Swelling   Zocor [simvastatin] Other (See Comments)   Cognitive decrease        Medication List     STOP taking these medications    bisacodyl 5 MG EC tablet Commonly known as: DULCOLAX   hydrocortisone 2.5 % rectal cream Commonly known as: ANUSOL-HC   metroNIDAZOLE 500 MG tablet Commonly known as: FLAGYL   neomycin 500 MG tablet Commonly known as: MYCIFRADIN       TAKE these medications    ALPRAZolam 0.5 MG tablet Commonly known as: XANAX 1 tab by mouth twice per day as needed   amLODipine 5 MG tablet Commonly known as: NORVASC Take 1 tablet (5 mg total) by mouth daily.   ARIPiprazole 5 MG tablet Commonly known as: ABILIFY Take 1 tablet (5 mg total) by mouth daily.   ascorbic acid 500 MG tablet Commonly known as: VITAMIN C Take 1 tablet (500 mg total) by mouth daily.   cyanocobalamin 1000 MCG tablet Commonly known as: VITAMIN B12 Take 1 tablet (1,000 mcg total) by mouth daily.   donepezil 10 MG tablet Commonly known as: ARICEPT Take 1 tablet (10 mg total) by mouth at bedtime.   ferrous sulfate 325 (65 FE) MG tablet Take 1 tablet (325 mg total) by  mouth daily.   labetalol 300 MG tablet Commonly known as: NORMODYNE Take 1 tablet (300 mg total) by mouth 2 (two) times daily.   polycarbophil 625 MG tablet Commonly known as: FIBERCON Take 1 tablet (625 mg total)  by mouth 2 (two) times daily.   rosuvastatin 20 MG tablet Commonly known as: CRESTOR Take 20 mg by mouth daily.   sertraline 100 MG tablet Commonly known as: ZOLOFT 1 tab by mouth once daily   telmisartan 80 MG tablet Commonly known as: MICARDIS Take 1 tablet (80 mg total) by mouth daily.   traMADol 50 MG tablet Commonly known as: ULTRAM Take 1-2 tablets (50-100 mg total) by mouth every 6 (six) hours as needed for moderate pain or severe pain.               Discharge Care Instructions  (From admission, onward)           Start     Ordered   07/31/22 0000  Discharge wound care:       Comments: It is good for closed incisions and even open wounds to be washed every day.  Shower every day.  Short baths are fine.  Wash the incisions and wounds clean with soap & water.    You may leave closed incisions open to air if it is dry.   You may cover the incision with clean gauze & replace it after your daily shower for comfort.  TEGADERM:  You have clear gauze band-aid dressings over your closed incision(s).  Remove the dressings 3 days after surgery = 12/9 Saturday   07/31/22 2947            Significant Diagnostic Studies:  Results for orders placed or performed during the hospital encounter of 07/26/22 (from the past 72 hour(s))  Potassium     Status: None   Collection Time: 07/29/22  5:23 AM  Result Value Ref Range   Potassium 4.0 3.5 - 5.1 mmol/L    Comment: Performed at [facility], [place] [address], [place], [place] [address]  Hemoglobin     Status: Abnormal   Collection Time: 07/29/22  5:23 AM  Result Value Ref Range   Hemoglobin 7.1 (L) 12.0 - 15.0 g/dL    Comment: Performed at [facility], [place] [address] [place], [place] [address]  Creatinine, serum     Status: None   Collection Time: 07/29/22  5:23 AM  Result Value Ref Range   Creatinine, Ser 0.89 0.44 - 1.00 mg/dL   GFR, Estimated >60 >60 mL/min    Comment: (NOTE) Calculated using the CKD-EPI Creatinine Equation (2021) Performed at [facility], [place] [address] [place], [place] [address]   ABO/Rh     Status: None   Collection Time: 07/30/22  4:20 AM  Result Value Ref Range   ABO/RH(D)      O POS Performed at [facility], [place] [address] [place], [place] [address]   Potassium     Status: None   Collection Time: 07/30/22  4:21 AM  Result Value Ref Range   Potassium 4.2 3.5 - 5.1 mmol/L    Comment: Performed at [facility], [place] [address] [place], [place] [address]  Hemoglobin     Status: Abnormal   Collection Time: 07/30/22  4:21 AM  Result Value Ref Range   Hemoglobin 6.4 (LL) 12.0 - 15.0 g/dL    Comment: REPEATED TO VERIFY THIS CRITICAL RESULT HAS VERIFIED AND BEEN CALLED TO JASON MILLER, RN BY MEGAN HAYES ON 12 10 2023 AT 0517, AND HAS BEEN READ BACK. CRITICAL RESULT VERIFIED Performed at [facility], [place] [address] [place], [place] [address]   Creatinine, serum  Status: None   Collection Time: 07/30/22  4:21 AM  Result Value Ref Range   Creatinine, Ser 0.83 0.44 - 1.00 mg/dL   GFR, Estimated >60 >60 mL/min    Comment: (NOTE) Calculated using the CKD-EPI Creatinine Equation (2021) Performed at [facility], [place] [address], [place], [place] [address]   Type and screen [place]     Status: None (Preliminary result)   Collection Time: 07/30/22  7:09 AM  Result Value Ref Range   ABO/RH(D) O POS    Antibody Screen NEG    Sample Expiration 08/02/2022,2359    Unit Number [id number]    Blood Component Type RED CELLS,LR    Unit division 00    Status of Unit ISSUED    Transfusion Status OK TO  TRANSFUSE    Crossmatch Result      Compatible Performed at [facility], [place] [address] [place], [place] [address]   Prepare RBC (crossmatch)     Status: None   Collection Time: 07/30/22  7:09 AM  Result Value Ref Range   Order Confirmation      ORDER PROCESSED BY BLOOD BANK Performed at [facility], [place] [address], [place], [place] [address]     No results found.  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     bladder operation, but per sling procedure     BUNIONECTOMY     CERVICAL DISC SURGERY     fx rt ankle     left temporal bx artery-negative 2      PARTIAL NEPHRECTOMY Left    percutaneous drainage of liver abscess  05/06/2012   PROCTOSCOPY N/A 07/26/2022   Procedure: RIGID PROCTOSCOPY;  Surgeon: Michael Boston, MD;  Location: [facility];  Service: General;  Laterality: N/A;   RECTOPEXY N/A 07/26/2022   Procedure: RECTOPEXY;  Surgeon: Michael Boston, MD;  Location: [facility];  Service: General;  Laterality: N/A;    Social History   Socioeconomic History   Marital status: Widowed    Spouse name: Not on file   Number of children: 1   Years of education: Not on file   Highest education level: Not on file  Occupational History   Occupation: Retired  Tobacco Use   Smoking status: Never   Smokeless tobacco: Never  Vaping Use   Vaping Use: Never used  Substance and  Sexual Activity   Alcohol use: No   Drug use: No   Sexual activity: Not Currently    Birth control/protection: Post-menopausal  Other Topics Concern   Not on file  Social History Narrative   Widowed since December 2021; married for over 71 years.   Lives in a 2-story home with cat.   Right  handed   No caffeine   Social Determinants of Health   Financial Resource Strain: Low Risk  (10/26/2021)   Overall Financial Resource Strain (CARDIA)    Difficulty of Paying Living Expenses: Not hard at all  Food Insecurity: No Food Insecurity (07/26/2022)   Hunger Vital Sign    Worried About Running Out of Food in the Last Year: Never true    Ran Out of Food in the Last Year: Never true  Transportation Needs: No Transportation Needs (07/26/2022)   PRAPARE - Hydrologist (Medical): No    Lack of Transportation (Non-Medical): No  Physical Activity: Sufficiently Active (10/26/2021)   Exercise Vital Sign    Days of Exercise per Week: 5 days    Minutes of Exercise per Session: 30 min  Stress: No Stress Concern Present (10/26/2021)   [place]    Feeling of Stress : Not at all  Social Connections: Moderately Integrated (10/26/2021)   Social Connection and Isolation Panel [NHANES]    Frequency of Communication with Friends and Family: More than three times a week    Frequency of Social Gatherings with Friends and Family: Once a week    Attends Religious Services: More than 4 times per year    Active Member of [employer] or Organizations: No    Attends Music therapist: More than 4 times per year    Marital Status: Widowed  Intimate Partner Violence: Not At Risk (07/26/2022)   Humiliation, Afraid, Rape, and Kick questionnaire    Fear of Current or Ex-Partner: No    Emotionally Abused: No    Physically Abused: No    Sexually Abused: No    Family History  Problem Relation Age of Onset   Stroke  Mother    Stomach cancer Sister    Colon cancer Neg Hx    Colon polyps Neg Hx    Esophageal cancer Neg Hx    Rectal cancer Neg Hx     Current Facility-Administered Medications  Medication Dose Route Frequency Provider Last Rate Last Admin   0.9 %  sodium chloride infusion (Manually program via Guardrails IV Fluids)   Intravenous Once Michael Boston, MD       0.9 %  sodium chloride infusion  250 mL Intravenous PRN Dameer Speiser, Remo Lipps, MD       0.9 %  sodium chloride infusion  250 mL Intravenous PRN Rivaldo Hineman, Remo Lipps, MD       acetaminophen (TYLENOL) tablet 1,000 mg  1,000 mg Oral Q6H Michael Boston, MD   1,000 mg at 07/31/22 0746   ALPRAZolam Duanne Moron) tablet 0.5 mg  0.5 mg Oral BID PRN Michael Boston, MD       alum & mag hydroxide-simeth (MAALOX/MYLANTA) 200-200-20 MG/5ML suspension 30 mL  30 mL Oral Q6H PRN Michael Boston, MD       amLODipine (NORVASC) tablet 5 mg  5 mg Oral Daily Michael Boston, MD   5 mg at 07/30/22 1040   ARIPiprazole (ABILIFY) tablet 5 mg  5 mg Oral Daily Michael Boston, MD   5 mg at 07/30/22 1037   ascorbic acid (VITAMIN C) tablet 500 mg  500 mg Oral BID Michael Boston, MD   500 mg at 07/30/22 2059   cyanocobalamin (VITAMIN B12) tablet 1,000 mcg  1,000 mcg Oral Daily Michael Boston, MD   1,000 mcg at 07/30/22 1040   diphenhydrAMINE (BENADRYL) 12.5 MG/5ML elixir 12.5 mg  12.5 mg Oral Q6H PRN  Michael Boston, MD       Or   diphenhydrAMINE (BENADRYL) injection 12.5 mg  12.5 mg Intravenous Q6H PRN Michael Boston, MD       donepezil (ARICEPT) tablet 10 mg  10 mg Oral Ardeen Fillers, MD   10 mg at 07/30/22 2059   feeding supplement (ENSURE SURGERY) liquid 237 mL  237 mL Oral BID BM Michael Boston, MD   237 mL at 07/30/22 1043   hydrALAZINE (APRESOLINE) injection 10 mg  10 mg Intravenous Q2H PRN Michael Boston, MD       HYDROmorphone (DILAUDID) injection 0.5-2 mg  0.5-2 mg Intravenous Q4H PRN Michael Boston, MD   1 mg at 07/26/22 1606   irbesartan (AVAPRO) tablet 150 mg  150 mg Oral Daily Michael Boston, MD   150 mg at 07/30/22 1038   labetalol (NORMODYNE) tablet 100 mg  100 mg Oral TID Michael Boston, MD   100 mg at 07/30/22 2059   lip balm (CARMEX) ointment   Topical BID Michael Boston, MD   Given at 07/30/22 2100   loperamide (IMODIUM) capsule 2 mg  2 mg Oral Once Michael Boston, MD       magic mouthwash  15 mL Oral QID PRN Michael Boston, MD       melatonin tablet 3 mg  3 mg Oral QHS PRN Michael Boston, MD   3 mg at 07/30/22 2058   methocarbamol (ROBAXIN) tablet 500 mg  500 mg Oral Q6H PRN Michael Boston, MD   500 mg at 07/27/22 2115   Or   methocarbamol (ROBAXIN) 500 mg in dextrose 5 % 50 mL IVPB  500 mg Intravenous Q6H PRN Michael Boston, MD       metoprolol tartrate (LOPRESSOR) injection 5 mg  5 mg Intravenous Q6H PRN Michael Boston, MD       metoprolol tartrate (LOPRESSOR) injection 5 mg  5 mg Intravenous Q6H PRN Michael Boston, MD       multivitamins with iron tablet 1 tablet  1 tablet Oral Daily Michael Boston, MD   1 tablet at 07/30/22 1039   ondansetron (ZOFRAN) tablet 4 mg  4 mg Oral Q6H PRN Michael Boston, MD       Or   ondansetron [facility]) injection 4 mg  4 mg Intravenous Q6H PRN Michael Boston, MD   4 mg at 07/28/22 1232   polycarbophil (FIBERCON) tablet 625 mg  625 mg Oral BID Michael Boston, MD   625 mg at 07/30/22 2058   potassium chloride SA (KLOR-CON M) CR tablet 40 mEq  40 mEq Oral Daily Michael Boston, MD   40 mEq at 07/30/22 1039   prochlorperazine (COMPAZINE) tablet 10 mg  10 mg Oral Q6H PRN Michael Boston, MD       Or   prochlorperazine (COMPAZINE) injection 5-10 mg  5-10 mg Intravenous Q6H PRN Michael Boston, MD       rosuvastatin (CRESTOR) tablet 20 mg  20 mg Oral Daily Michael Boston, MD   20 mg at 07/30/22 1040   sertraline (ZOLOFT) tablet 100 mg  100 mg Oral Daily Michael Boston, MD   100 mg at 07/30/22 1038   simethicone (MYLICON) chewable tablet 40 mg  40 mg Oral Q6H PRN Michael Boston, MD       sodium chloride flush (NS) 0.9 % injection 3 mL  3 mL Intravenous Gorden Harms, MD   3 mL at 07/30/22 2059   sodium chloride flush (NS) 0.9 % injection 3 mL  3 mL Intravenous PRN Michael Boston, MD       sodium chloride flush (NS) 0.9 % injection 3 mL  3 mL Intravenous Gorden Harms, MD   3 mL at 07/30/22 1044   sodium chloride flush (NS) 0.9 % injection 3 mL  3 mL Intravenous PRN Michael Boston, MD       traMADol Veatrice Bourbon) tablet 50-100 mg  50-100 mg Oral Q6H PRN Michael Boston, MD   50 mg at 07/29/22 1412     Allergies  Allergen Reactions   Codeine Swelling   Shellfish Allergy Swelling    Throat swells   Sulfonamide Derivatives Swelling   Zocor [Simvastatin] Other (See Comments)    Cognitive decrease    Signed:   Adin Hector, MD, FACS, MASCRS Esophageal, Gastrointestinal & Colorectal Surgery Robotic and Minimally Invasive Surgery  [facility] [place] [address], [place], [place] [address] [phone] Fax [phone] Main  CONTACT INFORMATION:  Weekday (9AM-5PM): Call [facility] main office at [phone]  Weeknight (5PM-9AM) or Weekend/Holiday: Check [email or web address] (password " [email or web address]") for General Surgery [facility] coverage  (Please, do not use SecureChat as it is not reliable communication to reach operating surgeons for immediate patient care given surgeries/outpatient duties/clinic/cross-coverage/off post-call which would lead to a delay in care.  Epic staff messaging available for outptient concerns, but may not be answered for 48 hours or more).     07/31/2022, 8:32 AM

## 2022-07-31 NOTE — Progress Notes (Signed)
Nurse reviewed discharge instructions with pt and her daughter Stanton Kidney.  Pt  and daughter verbalized understanding of discharge instructions, follow up appointment and new medications.  Nurse attempted to call report to [facility] and left a callback number as nurse was busy at the time.  No concerns at time of discharge.

## 2022-07-31 NOTE — TOC Transition Note (Signed)
Transition of Care [facility]) - CM/SW Discharge Note   Patient Details  Name: Alyssa Fernandez MRN: [id number] Date of Birth: August 02, 1941  Transition of Care [facility]) CM/SW Contact:  Lennart Pall, LCSW Phone Number: 07/31/2022, 9:57 AM   Clinical Narrative:    Pt medically cleared for discharge today to [facility].  Daughter to provide dc transportation.  Have insurance auth.  RN to call report to [phone].  No further TOC needs.   Final next level of care: Skilled Nursing Facility Barriers to Discharge: Barriers Resolved   Patient Goals and CMS Choice Patient states their goals for this hospitalization and ongoing recovery are:: pt prefers to dc home   Choice offered to / list presented to : Patient, Adult Children  Discharge Placement              Patient chooses bed at: [facility] Patient to be transferred to facility by: daughter to transport Name of family member notified: daughter    Discharge Plan and Services In-house Referral: Clinical Social Work              DME Arranged: N/A DME Agency: NA                  Social Determinants of Health (SDOH) Interventions Food Insecurity Interventions: Intervention Not Indicated Housing Interventions: Intervention Not Indicated Transportation Interventions: Intervention Not Indicated Utilities Interventions: Intervention Not Indicated   Readmission Risk Interventions    07/27/2022   10:56 AM  Readmission Risk Prevention Plan  Transportation Screening Complete  PCP or Specialist Appt within 5-7 Days Complete  Home Care Screening Complete  Medication Review (RN CM) Complete

## 2022-08-01 DIAGNOSIS — D649 Anemia, unspecified: Secondary | ICD-10-CM | POA: Diagnosis not present

## 2022-08-01 DIAGNOSIS — G309 Alzheimer's disease, unspecified: Secondary | ICD-10-CM | POA: Diagnosis not present

## 2022-08-01 DIAGNOSIS — K219 Gastro-esophageal reflux disease without esophagitis: Secondary | ICD-10-CM | POA: Diagnosis not present

## 2022-08-01 DIAGNOSIS — F02A0 Dementia in other diseases classified elsewhere, mild, without behavioral disturbance, psychotic disturbance, mood disturbance, and anxiety: Secondary | ICD-10-CM | POA: Diagnosis not present

## 2022-08-01 DIAGNOSIS — N183 Chronic kidney disease, stage 3 unspecified: Secondary | ICD-10-CM | POA: Diagnosis not present

## 2022-08-01 DIAGNOSIS — I10 Essential (primary) hypertension: Secondary | ICD-10-CM | POA: Diagnosis not present

## 2022-08-01 DIAGNOSIS — E782 Mixed hyperlipidemia: Secondary | ICD-10-CM | POA: Diagnosis not present

## 2022-08-01 DIAGNOSIS — F411 Generalized anxiety disorder: Secondary | ICD-10-CM | POA: Diagnosis not present

## 2022-08-01 DIAGNOSIS — K623 Rectal prolapse: Secondary | ICD-10-CM | POA: Diagnosis not present

## 2022-08-03 DIAGNOSIS — F0390 Unspecified dementia without behavioral disturbance: Secondary | ICD-10-CM | POA: Diagnosis not present

## 2022-08-03 DIAGNOSIS — D62 Acute posthemorrhagic anemia: Secondary | ICD-10-CM | POA: Diagnosis not present

## 2022-08-03 DIAGNOSIS — K623 Rectal prolapse: Secondary | ICD-10-CM | POA: Diagnosis not present

## 2022-08-07 DIAGNOSIS — K623 Rectal prolapse: Secondary | ICD-10-CM | POA: Diagnosis not present

## 2022-08-07 DIAGNOSIS — E782 Mixed hyperlipidemia: Secondary | ICD-10-CM | POA: Diagnosis not present

## 2022-08-07 DIAGNOSIS — F411 Generalized anxiety disorder: Secondary | ICD-10-CM | POA: Diagnosis not present

## 2022-08-07 DIAGNOSIS — N183 Chronic kidney disease, stage 3 unspecified: Secondary | ICD-10-CM | POA: Diagnosis not present

## 2022-08-07 DIAGNOSIS — F02A0 Dementia in other diseases classified elsewhere, mild, without behavioral disturbance, psychotic disturbance, mood disturbance, and anxiety: Secondary | ICD-10-CM | POA: Diagnosis not present

## 2022-08-15 DIAGNOSIS — F0150 Vascular dementia without behavioral disturbance: Secondary | ICD-10-CM | POA: Diagnosis not present

## 2022-08-15 DIAGNOSIS — K623 Rectal prolapse: Secondary | ICD-10-CM | POA: Diagnosis not present

## 2022-08-15 DIAGNOSIS — R41841 Cognitive communication deficit: Secondary | ICD-10-CM | POA: Diagnosis not present

## 2022-08-15 DIAGNOSIS — Q612 Polycystic kidney, adult type: Secondary | ICD-10-CM | POA: Diagnosis not present

## 2022-08-15 DIAGNOSIS — N183 Chronic kidney disease, stage 3 unspecified: Secondary | ICD-10-CM | POA: Diagnosis not present

## 2022-08-15 DIAGNOSIS — R2689 Other abnormalities of gait and mobility: Secondary | ICD-10-CM | POA: Diagnosis not present

## 2022-08-15 DIAGNOSIS — R293 Abnormal posture: Secondary | ICD-10-CM | POA: Diagnosis not present

## 2022-08-15 DIAGNOSIS — G9009 Other idiopathic peripheral autonomic neuropathy: Secondary | ICD-10-CM | POA: Diagnosis not present

## 2022-08-15 DIAGNOSIS — M6281 Muscle weakness (generalized): Secondary | ICD-10-CM | POA: Diagnosis not present

## 2022-08-16 DIAGNOSIS — F411 Generalized anxiety disorder: Secondary | ICD-10-CM | POA: Diagnosis not present

## 2022-08-16 DIAGNOSIS — F02A0 Dementia in other diseases classified elsewhere, mild, without behavioral disturbance, psychotic disturbance, mood disturbance, and anxiety: Secondary | ICD-10-CM | POA: Diagnosis not present

## 2022-08-16 DIAGNOSIS — K219 Gastro-esophageal reflux disease without esophagitis: Secondary | ICD-10-CM | POA: Diagnosis not present

## 2022-08-16 DIAGNOSIS — G309 Alzheimer's disease, unspecified: Secondary | ICD-10-CM | POA: Diagnosis not present

## 2022-08-16 DIAGNOSIS — I10 Essential (primary) hypertension: Secondary | ICD-10-CM | POA: Diagnosis not present

## 2022-08-17 DIAGNOSIS — L290 Pruritus ani: Secondary | ICD-10-CM

## 2022-08-17 HISTORY — DX: Pruritus ani: L29.0

## 2022-08-22 DIAGNOSIS — I10 Essential (primary) hypertension: Secondary | ICD-10-CM | POA: Diagnosis not present

## 2022-08-22 DIAGNOSIS — Z7189 Other specified counseling: Secondary | ICD-10-CM | POA: Diagnosis not present

## 2022-08-22 DIAGNOSIS — G309 Alzheimer's disease, unspecified: Secondary | ICD-10-CM | POA: Diagnosis not present

## 2022-08-22 DIAGNOSIS — K623 Rectal prolapse: Secondary | ICD-10-CM | POA: Diagnosis not present

## 2022-08-24 ENCOUNTER — Telehealth: Payer: Self-pay

## 2022-08-24 NOTE — Telephone Encounter (Signed)
Pt notice of approval from [employer] is send to scan,  Home health on physical therapy and occupational therapy

## 2022-08-30 ENCOUNTER — Telehealth: Payer: Self-pay

## 2022-08-30 DIAGNOSIS — Z0289 Encounter for other administrative examinations: Secondary | ICD-10-CM

## 2022-08-30 NOTE — Telephone Encounter (Signed)
Pt forms given to provider to sign

## 2022-08-31 DIAGNOSIS — E039 Hypothyroidism, unspecified: Secondary | ICD-10-CM | POA: Diagnosis not present

## 2022-08-31 DIAGNOSIS — E559 Vitamin D deficiency, unspecified: Secondary | ICD-10-CM | POA: Diagnosis not present

## 2022-08-31 DIAGNOSIS — E08311 Diabetes mellitus due to underlying condition with unspecified diabetic retinopathy with macular edema: Secondary | ICD-10-CM | POA: Diagnosis not present

## 2022-08-31 DIAGNOSIS — D519 Vitamin B12 deficiency anemia, unspecified: Secondary | ICD-10-CM | POA: Diagnosis not present

## 2022-08-31 DIAGNOSIS — D508 Other iron deficiency anemias: Secondary | ICD-10-CM | POA: Diagnosis not present

## 2022-08-31 DIAGNOSIS — E785 Hyperlipidemia, unspecified: Secondary | ICD-10-CM | POA: Diagnosis not present

## 2022-09-04 DIAGNOSIS — Z0289 Encounter for other administrative examinations: Secondary | ICD-10-CM

## 2022-09-04 NOTE — Telephone Encounter (Signed)
Forms signed and faxed

## 2022-09-19 DIAGNOSIS — G309 Alzheimer's disease, unspecified: Secondary | ICD-10-CM | POA: Diagnosis not present

## 2022-09-19 DIAGNOSIS — K623 Rectal prolapse: Secondary | ICD-10-CM | POA: Diagnosis not present

## 2022-09-19 DIAGNOSIS — F02A3 Dementia in other diseases classified elsewhere, mild, with mood disturbance: Secondary | ICD-10-CM | POA: Diagnosis not present

## 2022-09-19 DIAGNOSIS — I10 Essential (primary) hypertension: Secondary | ICD-10-CM | POA: Diagnosis not present

## 2022-10-10 ENCOUNTER — Telehealth: Payer: Self-pay

## 2022-10-10 NOTE — Telephone Encounter (Signed)
[place] to schedule their annual wellness visit. Patient declined to schedule AWV at this time.  Patient is in assisted living and declines AWV.   Norton Blizzard, [place] ([facility])  [place] [facility] [phone]

## 2022-10-16 ENCOUNTER — Ambulatory Visit: Payer: Self-pay | Admitting: Surgery

## 2022-10-16 DIAGNOSIS — K580 Irritable bowel syndrome with diarrhea: Secondary | ICD-10-CM

## 2022-10-16 DIAGNOSIS — K439 Ventral hernia without obstruction or gangrene: Secondary | ICD-10-CM

## 2022-10-16 HISTORY — DX: Irritable bowel syndrome with diarrhea: K58.0

## 2022-10-16 HISTORY — DX: Ventral hernia without obstruction or gangrene: K43.9

## 2022-10-20 NOTE — Patient Instructions (Addendum)
DUE TO COVID-19 ONLY TWO VISITORS  (aged 84 and older)  ARE ALLOWED TO COME WITH YOU AND STAY IN THE WAITING ROOM ONLY DURING PRE OP AND PROCEDURE.    IF YOU WILL BE ADMITTED INTO THE HOSPITAL YOU ARE ALLOWED ONLY FOUR SUPPORT PEOPLE DURING VISITATION HOURS ONLY (7 AM -8PM)   The support person(s) must pass our screening, gel in and out, and wear a mask at all times, including in the patient's room. Patients must also wear a mask when staff or their support person are in the room. Visitors GUEST BADGE MUST BE WORN VISIBLY  One adult visitor may remain with you overnight and MUST be in the room by 8 P.M.     Your procedure is scheduled on: 10/26/22   Report to [facility] Main Entrance    Report to admitting at   5:15 AM   Call this number if you have problems the morning of surgery [phone]   Do not eat food :After Midnight.   After Midnight you may have the following liquids until __4:30_AM/ DAY OF SURGERY  Water Black Coffee (sugar ok, NO MILK/CREAM OR CREAMERS)  Tea (sugar ok, NO MILK/CREAM OR CREAMERS) regular and decaf                             Plain Jell-O (NO RED)                                           Fruit ices (not with fruit pulp, NO RED)                                     Popsicles (NO RED)                                                                  Juice: apple, WHITE grape, WHITE cranberry Sports drinks like Gatorade (NO RED)                  The day of surgery:  Drink ONE (1) Pre-Surgery Clear Ensure  at 4:15 AM the morning of surgery. Drink in one sitting. Do not sip.  This drink was given to you during your hospital  pre-op appointment visit. Nothing else to drink after completing the  Pre-Surgery Clear Ensure 4:30 AM          If you have questions, please contact your surgeon's office.      Oral Hygiene is also important to reduce your risk of infection.                                    Remember - BRUSH YOUR TEETH THE MORNING OF  SURGERY WITH YOUR REGULAR TOOTHPASTE  DENTURES WILL BE REMOVED PRIOR TO SURGERY PLEASE DO NOT APPLY "Poly grip" OR ADHESIVES!!!    Take these medicines the morning of surgery with A SIP OF WATER: Sertraline-Zoloft  Rosuvastatin-Crestor                                                                                                                            Aripiprazole-Abilify                                                                                                                            Labetalol-Normodyne                                                                                                                            Amlodipine-Norvasc  .                              You may not have any metal on your body including hair pins, jewelry, and body piercing             Do not wear make-up, lotions, powders, perfumes or deodorant  Do not wear nail polish including gel and S&S, artificial/acrylic nails, or any other type of covering on natural nails including finger and toenails. If you have artificial nails, gel coating, etc. that needs to be removed by a nail salon please have this removed prior to surgery or surgery may need to be canceled/ delayed if the surgeon/ anesthesia feels like they are unable to be safely monitored.   Do not shave  48 hours prior to surgery.     Do not bring valuables to the hospital. [place].   Contacts, glasses, or bridgework may not be worn into surgery.    DO NOT [place].     Patients discharged on the day of surgery will not be allowed to drive home.  Someone NEEDS to  stay with you for the first 24 hours after anesthesia.   Special Instructions: Bring a copy of your healthcare power of attorney and living will documents  the day  of surgery if you haven't scanned them before.              Please read over the following fact sheets you were given: IF YOU HAVE QUESTIONS ABOUT YOUR PRE-OP INSTRUCTIONS PLEASE CALL [phone]    [facility] - Preparing for Surgery Before surgery, you can play an important role.  Because skin is not sterile, your skin needs to be as free of germs as possible.  You can reduce the number of germs on your skin by washing with CHG (chlorahexidine gluconate) soap before surgery.  CHG is an antiseptic cleaner which kills germs and bonds with the skin to continue killing germs even after washing. Please DO NOT use if you have an allergy to CHG or antibacterial soaps.  If your skin becomes reddened/irritated stop using the CHG and inform your nurse when you arrive at Short Stay. Do not shave (including legs and underarms) for at least 48 hours prior to the first CHG shower.   Please follow these instructions carefully:  1.  Shower with CHG Soap the night before surgery and the  morning of Surgery.  2.  If you choose to wash your hair, wash your hair first as usual with your  normal  shampoo.  3.  After you shampoo, rinse your hair and body thoroughly to remove the  shampoo.                            4.  Use CHG as you would any other liquid soap.  You can apply chg directly  to the skin and wash                       Gently with a scrungie or clean washcloth.  5.  Apply the CHG Soap to your body ONLY FROM THE NECK DOWN.   Do not use on face/ open                           Wound or open sores. Avoid contact with eyes, ears mouth and genitals (private parts).                       Wash face,  Genitals (private parts) with your normal soap.             6.  Wash thoroughly, paying special attention to the area where your surgery  will be performed.  7.  Thoroughly rinse your body with warm water from the neck down.  8.  DO NOT shower/wash with your normal soap after using and rinsing off  the CHG Soap.              9.  Pat yourself dry with a clean towel.            10.  Wear clean pajamas.            11.  Place clean sheets on your bed the night of your first shower and do not  sleep with pets. Day of Surgery : Do not apply any lotions/deodorants the morning of surgery.  Please wear clean clothes to the hospital/surgery center.  FAILURE TO FOLLOW THESE INSTRUCTIONS MAY RESULT IN  THE CANCELLATION OF YOUR SURGERY  PATIENT SIGNATURE_________________________________  ________________________________________________________________________

## 2022-10-24 ENCOUNTER — Other Ambulatory Visit: Payer: Self-pay

## 2022-10-24 ENCOUNTER — Encounter (HOSPITAL_COMMUNITY): Payer: Self-pay

## 2022-10-24 ENCOUNTER — Encounter (HOSPITAL_COMMUNITY)
Admission: RE | Admit: 2022-10-24 | Discharge: 2022-10-24 | Disposition: A | Discharge status: Home / Self Care | Payer: PPO | Source: Ambulatory Visit | Attending: Surgery | Admitting: Surgery

## 2022-10-24 DIAGNOSIS — I10 Essential (primary) hypertension: Secondary | ICD-10-CM | POA: Insufficient documentation

## 2022-10-24 DIAGNOSIS — Z01812 Encounter for preprocedural laboratory examination: Secondary | ICD-10-CM | POA: Insufficient documentation

## 2022-10-24 HISTORY — DX: Malignant (primary) neoplasm, unspecified: C80.1

## 2022-10-24 HISTORY — DX: Unspecified dementia, unspecified severity, without behavioral disturbance, psychotic disturbance, mood disturbance, and anxiety: F03.90

## 2022-10-24 HISTORY — DX: Depression, unspecified: F32.A

## 2022-10-24 LAB — BASIC METABOLIC PANEL
Anion gap: 9 (ref 5–15)
BUN: 23 mg/dL (ref 8–23)
CO2: 30 mmol/L (ref 22–32)
Calcium: 9.1 mg/dL (ref 8.9–10.3)
Chloride: 101 mmol/L (ref 98–111)
Creatinine, Ser: 1.18 mg/dL — ABNORMAL HIGH (ref 0.44–1.00)
GFR, Estimated: 46 mL/min — ABNORMAL LOW (ref >60)
Glucose, Bld: 110 mg/dL — ABNORMAL HIGH (ref 70–99)
Potassium: 3.1 mmol/L — ABNORMAL LOW (ref 3.5–5.1)
Sodium: 140 mmol/L (ref 135–145)

## 2022-10-24 LAB — CBC
HCT: 42.3 % (ref 36.0–46.0)
Hemoglobin: 13.7 g/dL (ref 12.0–15.0)
MCH: 29 pg (ref 26.0–34.0)
MCHC: 32.4 g/dL (ref 30.0–36.0)
MCV: 89.6 fL (ref 80.0–100.0)
Platelets: 219 10*3/uL (ref 150–400)
RBC: 4.72 MIL/uL (ref 3.87–5.11)
RDW: 13.1 % (ref 11.5–15.5)
WBC: 6.4 10*3/uL (ref 4.0–10.5)
nRBC: 0 % (ref 0.0–0.2)

## 2022-10-24 NOTE — Progress Notes (Addendum)
For Short Stay: [facility] appointment date:  Bowel Prep reminder: N/A   For Anesthesia: PCP - Dr. Cathlean Cower. Pt. Just move to assisting living recently,now she is under their MD. Care. Cardiologist - N/A  Chest x-ray -  EKG - 07/17/22 Stress Test -  ECHO - 2013 Cardiac Cath -  Pacemaker/ICD device last checked: Pacemaker orders received: Device Rep notified:  Spinal Cord Stimulator:  Sleep Study - N/A CPAP -   Fasting Blood Sugar - N/A Checks Blood Sugar _____ times a day Date and result of last Hgb A1c-  Last dose of GLP1 agonist-  GLP1 instructions:   Last dose of SGLT-2 inhibitors-  SGLT-2 instructions:   Blood Thinner Instructions: Aspirin Instructions: Last Dose:  Activity level: Can go up a flight of stairs and activities of daily living without stopping and without chest pain and/or shortness of breath   Able to exercise without chest pain and/or shortness of breath  Anesthesia review: Hx: HTN,Narcolepsy,Early dementia( Pt. Was able to answer most of the questions without assistance,she verbalized that she came for her labs and that she's having a left hernia repair).  Patient denies shortness of breath, fever, cough and chest pain at PAT appointment   Patient verbalized understanding of instructions that were given to them at the PAT appointment. Patient was also instructed that they will need to review over the PAT instructions again at home before surgery.For Short Stay: [facility] appointment date:

## 2022-10-25 DIAGNOSIS — I129 Hypertensive chronic kidney disease with stage 1 through stage 4 chronic kidney disease, or unspecified chronic kidney disease: Secondary | ICD-10-CM | POA: Diagnosis not present

## 2022-10-25 DIAGNOSIS — D631 Anemia in chronic kidney disease: Secondary | ICD-10-CM | POA: Diagnosis not present

## 2022-10-25 DIAGNOSIS — N183 Chronic kidney disease, stage 3 unspecified: Secondary | ICD-10-CM | POA: Diagnosis not present

## 2022-10-25 DIAGNOSIS — K469 Unspecified abdominal hernia without obstruction or gangrene: Secondary | ICD-10-CM | POA: Diagnosis not present

## 2022-10-26 ENCOUNTER — Other Ambulatory Visit: Payer: Self-pay

## 2022-10-26 ENCOUNTER — Encounter (HOSPITAL_COMMUNITY): Payer: Self-pay | Admitting: Surgery

## 2022-10-26 ENCOUNTER — Other Ambulatory Visit (HOSPITAL_COMMUNITY): Payer: Self-pay

## 2022-10-26 ENCOUNTER — Ambulatory Visit (HOSPITAL_COMMUNITY)
Admission: RE | Admit: 2022-10-26 | Discharge: 2022-10-26 | Disposition: A | Discharge status: Home / Self Care | Payer: PPO | Attending: Surgery | Admitting: Surgery

## 2022-10-26 ENCOUNTER — Ambulatory Visit (HOSPITAL_COMMUNITY): Payer: PPO | Admitting: Anesthesiology

## 2022-10-26 ENCOUNTER — Ambulatory Visit (HOSPITAL_BASED_OUTPATIENT_CLINIC_OR_DEPARTMENT_OTHER): Payer: PPO | Admitting: Anesthesiology

## 2022-10-26 ENCOUNTER — Encounter (HOSPITAL_COMMUNITY)
Admission: RE | Disposition: A | Discharge status: Home / Self Care | Payer: Self-pay | Source: Home / Self Care | Attending: Surgery

## 2022-10-26 DIAGNOSIS — K219 Gastro-esophageal reflux disease without esophagitis: Secondary | ICD-10-CM | POA: Diagnosis not present

## 2022-10-26 DIAGNOSIS — F02A0 Dementia in other diseases classified elsewhere, mild, without behavioral disturbance, psychotic disturbance, mood disturbance, and anxiety: Secondary | ICD-10-CM | POA: Diagnosis not present

## 2022-10-26 DIAGNOSIS — K439 Ventral hernia without obstruction or gangrene: Secondary | ICD-10-CM | POA: Diagnosis not present

## 2022-10-26 DIAGNOSIS — K4020 Bilateral inguinal hernia, without obstruction or gangrene, not specified as recurrent: Secondary | ICD-10-CM

## 2022-10-26 DIAGNOSIS — K4190 Unilateral femoral hernia, without obstruction or gangrene, not specified as recurrent: Secondary | ICD-10-CM

## 2022-10-26 DIAGNOSIS — G300 Alzheimer's disease with early onset: Secondary | ICD-10-CM | POA: Insufficient documentation

## 2022-10-26 DIAGNOSIS — K580 Irritable bowel syndrome with diarrhea: Secondary | ICD-10-CM | POA: Insufficient documentation

## 2022-10-26 DIAGNOSIS — E039 Hypothyroidism, unspecified: Secondary | ICD-10-CM | POA: Insufficient documentation

## 2022-10-26 DIAGNOSIS — D175 Benign lipomatous neoplasm of intra-abdominal organs: Secondary | ICD-10-CM | POA: Insufficient documentation

## 2022-10-26 DIAGNOSIS — I10 Essential (primary) hypertension: Secondary | ICD-10-CM | POA: Insufficient documentation

## 2022-10-26 DIAGNOSIS — K436 Other and unspecified ventral hernia with obstruction, without gangrene: Secondary | ICD-10-CM | POA: Diagnosis not present

## 2022-10-26 HISTORY — PX: INGUINAL HERNIA REPAIR: SHX194

## 2022-10-26 SURGERY — REPAIR, HERNIA, INGUINAL, LAPAROSCOPIC
Anesthesia: General | Site: Abdomen | Laterality: Left

## 2022-10-26 MED ORDER — SODIUM CHLORIDE 0.9 % IV SOLN: 250.0000 mL | INTRAVENOUS | Status: DC | PRN

## 2022-10-26 MED ORDER — GABAPENTIN 100 MG PO CAPS
100.0000 mg | ORAL_CAPSULE | ORAL | Status: AC
  Administered 2022-10-26: 100 mg via ORAL
  Filled 2022-10-26: qty 1

## 2022-10-26 MED ORDER — TRAMADOL HCL 50 MG PO TABS: 50.0000 mg | ORAL_TABLET | Freq: Four times a day (QID) | ORAL | 0 refills | Status: DC | PRN

## 2022-10-26 MED ORDER — GLYCOPYRROLATE PF 0.2 MG/ML IJ SOSY
PREFILLED_SYRINGE | INTRAMUSCULAR | Status: DC | PRN
  Administered 2022-10-26: .2 mg via INTRAVENOUS

## 2022-10-26 MED ORDER — DEXAMETHASONE SODIUM PHOSPHATE 4 MG/ML IJ SOLN: 4.0000 mg | INTRAMUSCULAR | Status: DC

## 2022-10-26 MED ORDER — CHLORHEXIDINE GLUCONATE 0.12 % MT SOLN
15.0000 mL | Freq: Once | OROMUCOSAL | Status: AC
  Administered 2022-10-26: 15 mL via OROMUCOSAL

## 2022-10-26 MED ORDER — PHENYLEPHRINE 80 MCG/ML (10ML) SYRINGE FOR IV PUSH (FOR BLOOD PRESSURE SUPPORT)
PREFILLED_SYRINGE | INTRAVENOUS | Status: DC | PRN
  Administered 2022-10-26 (×2): 80 ug via INTRAVENOUS

## 2022-10-26 MED ORDER — CEFAZOLIN SODIUM-DEXTROSE 2-4 GM/100ML-% IV SOLN
2.0000 g | INTRAVENOUS | Status: AC
  Administered 2022-10-26: 2 g via INTRAVENOUS
  Filled 2022-10-26: qty 100

## 2022-10-26 MED ORDER — DEXAMETHASONE SODIUM PHOSPHATE 10 MG/ML IJ SOLN
INTRAMUSCULAR | Status: DC | PRN
  Administered 2022-10-26: 6 mg via INTRAVENOUS

## 2022-10-26 MED ORDER — SODIUM CHLORIDE 0.9% FLUSH: 3.0000 mL | INTRAVENOUS | Status: DC | PRN

## 2022-10-26 MED ORDER — LACTATED RINGERS IV SOLN: INTRAVENOUS | Status: DC

## 2022-10-26 MED ORDER — ACETAMINOPHEN 500 MG PO TABS
1000.0000 mg | ORAL_TABLET | ORAL | Status: AC
  Administered 2022-10-26: 1000 mg via ORAL
  Filled 2022-10-26: qty 2

## 2022-10-26 MED ORDER — EPHEDRINE SULFATE-NACL 50-0.9 MG/10ML-% IV SOSY
PREFILLED_SYRINGE | INTRAVENOUS | Status: DC | PRN
  Administered 2022-10-26: 5 mg via INTRAVENOUS
  Administered 2022-10-26 (×2): 10 mg via INTRAVENOUS
  Administered 2022-10-26 (×3): 5 mg via INTRAVENOUS

## 2022-10-26 MED ORDER — SODIUM CHLORIDE 0.9 % IV SOLN: INTRAVENOUS | Status: DC

## 2022-10-26 MED ORDER — CHLORHEXIDINE GLUCONATE CLOTH 2 % EX PADS: 6.0000 | MEDICATED_PAD | Freq: Once | CUTANEOUS | Status: DC

## 2022-10-26 MED ORDER — PROPOFOL 10 MG/ML IV BOLUS
INTRAVENOUS | Status: DC | PRN
  Administered 2022-10-26: 80 mg via INTRAVENOUS

## 2022-10-26 MED ORDER — FENTANYL CITRATE PF 50 MCG/ML IJ SOSY: 25.0000 ug | PREFILLED_SYRINGE | INTRAMUSCULAR | Status: DC | PRN

## 2022-10-26 MED ORDER — FENTANYL CITRATE (PF) 100 MCG/2ML IJ SOLN
INTRAMUSCULAR | Status: DC | PRN
  Administered 2022-10-26: 100 ug via INTRAVENOUS

## 2022-10-26 MED ORDER — LIDOCAINE 2% (20 MG/ML) 5 ML SYRINGE
INTRAMUSCULAR | Status: DC | PRN
  Administered 2022-10-26: 100 mg via INTRAVENOUS

## 2022-10-26 MED ORDER — BUPIVACAINE-EPINEPHRINE 0.25% -1:200000 IJ SOLN
INTRAMUSCULAR | Status: DC | PRN
  Administered 2022-10-26: 50 mL

## 2022-10-26 MED ORDER — ROCURONIUM BROMIDE 10 MG/ML (PF) SYRINGE
PREFILLED_SYRINGE | INTRAVENOUS | Status: DC | PRN
  Administered 2022-10-26: 50 mg via INTRAVENOUS

## 2022-10-26 MED ORDER — ONDANSETRON HCL 4 MG/2ML IJ SOLN
INTRAMUSCULAR | Status: AC
  Filled 2022-10-26: qty 4

## 2022-10-26 MED ORDER — ROCURONIUM BROMIDE 10 MG/ML (PF) SYRINGE
PREFILLED_SYRINGE | INTRAVENOUS | Status: AC
  Filled 2022-10-26: qty 10

## 2022-10-26 MED ORDER — LIDOCAINE HCL (PF) 2 % IJ SOLN
INTRAMUSCULAR | Status: AC
  Filled 2022-10-26: qty 10

## 2022-10-26 MED ORDER — BUPIVACAINE LIPOSOME 1.3 % IJ SUSP
INTRAMUSCULAR | Status: DC | PRN
  Administered 2022-10-26: 20 mL

## 2022-10-26 MED ORDER — BUPIVACAINE LIPOSOME 1.3 % IJ SUSP
INTRAMUSCULAR | Status: AC
  Filled 2022-10-26: qty 20

## 2022-10-26 MED ORDER — DEXAMETHASONE SODIUM PHOSPHATE 10 MG/ML IJ SOLN
INTRAMUSCULAR | Status: AC
  Filled 2022-10-26: qty 2

## 2022-10-26 MED ORDER — ONDANSETRON HCL 4 MG/2ML IJ SOLN
INTRAMUSCULAR | Status: DC | PRN
  Administered 2022-10-26: 4 mg via INTRAVENOUS

## 2022-10-26 MED ORDER — BUPIVACAINE LIPOSOME 1.3 % IJ SUSP: 20.0000 mL | Freq: Once | INTRAMUSCULAR | Status: DC

## 2022-10-26 MED ORDER — PHENYLEPHRINE 80 MCG/ML (10ML) SYRINGE FOR IV PUSH (FOR BLOOD PRESSURE SUPPORT)
PREFILLED_SYRINGE | INTRAVENOUS | Status: AC
  Filled 2022-10-26: qty 10

## 2022-10-26 MED ORDER — OXYCODONE HCL 5 MG/5ML PO SOLN: 5.0000 mg | Freq: Once | ORAL | Status: DC | PRN

## 2022-10-26 MED ORDER — SODIUM CHLORIDE 0.9% FLUSH: 3.0000 mL | Freq: Two times a day (BID) | INTRAVENOUS | Status: DC

## 2022-10-26 MED ORDER — PROPOFOL 10 MG/ML IV BOLUS
INTRAVENOUS | Status: AC
  Filled 2022-10-26: qty 20

## 2022-10-26 MED ORDER — FENTANYL CITRATE (PF) 100 MCG/2ML IJ SOLN
INTRAMUSCULAR | Status: AC
  Filled 2022-10-26: qty 2

## 2022-10-26 MED ORDER — SUGAMMADEX SODIUM 200 MG/2ML IV SOLN
INTRAVENOUS | Status: DC | PRN
  Administered 2022-10-26: 190 mg via INTRAVENOUS

## 2022-10-26 MED ORDER — BUPIVACAINE-EPINEPHRINE (PF) 0.25% -1:200000 IJ SOLN
INTRAMUSCULAR | Status: AC
  Filled 2022-10-26: qty 60

## 2022-10-26 MED ORDER — OXYCODONE HCL 5 MG PO TABS: 5.0000 mg | ORAL_TABLET | Freq: Once | ORAL | Status: DC | PRN

## 2022-10-26 MED ORDER — ENSURE PRE-SURGERY PO LIQD
296.0000 mL | Freq: Once | ORAL | Status: DC
  Filled 2022-10-26: qty 296

## 2022-10-26 MED ORDER — ONDANSETRON HCL 4 MG/2ML IJ SOLN: 4.0000 mg | Freq: Once | INTRAMUSCULAR | Status: DC | PRN

## 2022-10-26 MED ORDER — ORAL CARE MOUTH RINSE: 15.0000 mL | Freq: Once | OROMUCOSAL | Status: AC

## 2022-10-26 MED ORDER — PHENYLEPHRINE HCL-NACL 20-0.9 MG/250ML-% IV SOLN
INTRAVENOUS | Status: DC | PRN
  Administered 2022-10-26: 40 ug/min via INTRAVENOUS

## 2022-10-26 MED ORDER — EPHEDRINE 5 MG/ML INJ
INTRAVENOUS | Status: AC
  Filled 2022-10-26: qty 10

## 2022-10-26 SURGICAL SUPPLY — 50 items
APL PRP STRL LF DISP 70% ISPRP (MISCELLANEOUS) ×2
BAG COUNTER SPONGE SURGICOUNT (BAG) IMPLANT
BAG SPNG CNTER NS LX DISP (BAG)
BINDER ABDOMINAL 12 ML 46-62 (SOFTGOODS) IMPLANT
CABLE HIGH FREQUENCY MONO STRZ (ELECTRODE) ×2 IMPLANT
CHLORAPREP W/TINT 26 (MISCELLANEOUS) ×2 IMPLANT
COVER SURGICAL LIGHT HANDLE (MISCELLANEOUS) ×2 IMPLANT
DEVICE SECURE STRAP 25 ABSORB (INSTRUMENTS) ×1 IMPLANT
DRAPE LAPAROSCOPIC ABDOMINAL (DRAPES) ×2 IMPLANT
DRAPE WARM FLUID 44X44 (DRAPES) ×2 IMPLANT
DRSG TEGADERM 2-3/8X2-3/4 SM (GAUZE/BANDAGES/DRESSINGS) ×5 IMPLANT
DRSG TEGADERM 4X4.75 (GAUZE/BANDAGES/DRESSINGS) ×2 IMPLANT
ELECT REM PT RETURN 15FT ADLT (MISCELLANEOUS) ×2 IMPLANT
GAUZE SPONGE 2X2 STRL 8-PLY (GAUZE/BANDAGES/DRESSINGS) ×4 IMPLANT
GLOVE ECLIPSE 8.0 STRL XLNG CF (GLOVE) ×2 IMPLANT
GLOVE INDICATOR 8.0 STRL GRN (GLOVE) ×2 IMPLANT
GOWN STRL REUS W/ TWL XL LVL3 (GOWN DISPOSABLE) ×6 IMPLANT
GOWN STRL REUS W/TWL XL LVL3 (GOWN DISPOSABLE) ×6
IRRIG SUCT STRYKERFLOW 2 WTIP (MISCELLANEOUS)
IRRIGATION SUCT STRKRFLW 2 WTP (MISCELLANEOUS) IMPLANT
KIT BASIN OR (CUSTOM PROCEDURE TRAY) ×2 IMPLANT
KIT TURNOVER KIT A (KITS) ×1 IMPLANT
MARKER SKIN DUAL TIP RULER LAB (MISCELLANEOUS) ×2 IMPLANT
MESH BARD SOFT 6X6IN (Mesh General) ×3 IMPLANT
NDL INSUFFLATION 14GA 120MM (NEEDLE) IMPLANT
NEEDLE INSUFFLATION 14GA 120MM (NEEDLE) ×2 IMPLANT
PACK GENERAL/GYN (CUSTOM PROCEDURE TRAY) ×2 IMPLANT
PAD POSITIONING PINK XL (MISCELLANEOUS) ×2 IMPLANT
SCISSORS LAP 5X35 DISP (ENDOMECHANICALS) ×2 IMPLANT
SET TUBE SMOKE EVAC HIGH FLOW (TUBING) ×2 IMPLANT
SLEEVE ADV FIXATION 5X100MM (TROCAR) ×2 IMPLANT
SPIKE FLUID TRANSFER (MISCELLANEOUS) ×2 IMPLANT
STRIP CLOSURE SKIN 1/2X4 (GAUZE/BANDAGES/DRESSINGS) IMPLANT
SUT ETHIBOND NAB CT1 #1 30IN (SUTURE) IMPLANT
SUT MNCRL AB 4-0 PS2 18 (SUTURE) ×2 IMPLANT
SUT NOVA NAB DX-16 0-1 5-0 T12 (SUTURE) IMPLANT
SUT PDS AB 1 CT1 27 (SUTURE) ×4 IMPLANT
SUT PROLENE 1 CT 1 30 (SUTURE) IMPLANT
SUT VIC AB 2-0 SH 27 (SUTURE)
SUT VIC AB 2-0 SH 27X BRD (SUTURE) IMPLANT
SUT VICRYL 0 UR6 27IN ABS (SUTURE) ×2 IMPLANT
SUT VLOC BARB 180 ABS3/0GR12 (SUTURE) ×4
SUTURE VLOC BRB 180 ABS3/0GR12 (SUTURE) ×2 IMPLANT
SYR 20ML LL LF (SYRINGE) ×2 IMPLANT
TOWEL OR 17X26 10 PK STRL BLUE (TOWEL DISPOSABLE) ×2 IMPLANT
TOWEL OR NON WOVEN STRL DISP B (DISPOSABLE) ×2 IMPLANT
TRAY FOLEY MTR SLVR 16FR STAT (SET/KITS/TRAYS/PACK) IMPLANT
TRAY LAPAROSCOPIC (CUSTOM PROCEDURE TRAY) ×2 IMPLANT
TROCAR ADV FIXATION 5X100MM (TROCAR) ×2 IMPLANT
TROCAR BALLN 12MMX100 BLUNT (TROCAR) ×2 IMPLANT

## 2022-10-26 NOTE — Discharge Instructions (Signed)
HERNIA REPAIR: POST OP INSTRUCTIONS  ######################################################################  EAT Gradually transition to a high fiber diet with a fiber supplement over the next few weeks after discharge.  Start with a pureed / full liquid diet (see below)  WALK Walk an hour a day.  Control your pain to do that.    CONTROL PAIN Control pain so that you can walk, sleep, tolerate sneezing/coughing, and go up/down stairs.  HAVE A BOWEL MOVEMENT DAILY Keep your bowels regular to avoid problems.  OK to try a laxative to override constipation.  OK to use an antidairrheal to slow down diarrhea.  Call if not better after 2 tries  CALL IF YOU HAVE PROBLEMS/CONCERNS Call if you are still struggling despite following these instructions. Call if you have concerns not answered by these instructions  ######################################################################    DIET: Follow a light bland diet & liquids the first 24 hours after arrival home, such as soup, liquids, starches, etc.  Be sure to drink plenty of fluids.  Quickly advance to a usual solid diet within a few days.  Avoid fast food or heavy meals as your are more likely to get nauseated or have irregular bowels.  A low-fat, high-fiber diet for the rest of your life is ideal.   Take your usually prescribed home medications unless otherwise directed.  PAIN CONTROL: Pain is best controlled by a usual combination of three different methods TOGETHER: Ice/Heat Over the counter pain medication Prescription pain medication Most patients will experience some swelling and bruising around the hernia(s) such as the bellybutton, groins, or old incisions.  Ice packs or heating pads (30-60 minutes up to 6 times a day) will help. Use ice for the first few days to help decrease swelling and bruising, then switch to heat to help relax tight/sore spots and speed recovery.  Some people prefer to use ice alone, heat alone, alternating  between ice & heat.  Experiment to what works for you.  Swelling and bruising can take several weeks to resolve.   It is helpful to take an over-the-counter pain medication regularly for the first few weeks.  Choose one of the following that works best for you: Naproxen (Aleve, etc)  Two 220mg tabs twice a day Ibuprofen (Advil, etc) Three 200mg tabs four times a day (every meal & bedtime) Acetaminophen (Tylenol, etc) 325-650mg four times a day (every meal & bedtime) A  prescription for pain medication should be given to you upon discharge.  Take your pain medication as prescribed.  If you are having problems/concerns with the prescription medicine (does not control pain, nausea, vomiting, rash, itching, etc), please call us [phone] to see if we need to switch you to a different pain medicine that will work better for you and/or control your side effect better. If you need a refill on your pain medication, please contact your pharmacy.  They will contact our office to request authorization. Prescriptions will not be filled after 5 pm or on week-ends.  Avoid getting constipated.  Between the surgery and the pain medications, it is common to experience some constipation.  Increasing fluid intake and taking a fiber supplement (such as Metamucil, Citrucel, FiberCon, MiraLax, etc) 1-2 times a day regularly will usually help prevent this problem from occurring.  A mild laxative (prune juice, Milk of Magnesia, MiraLax, etc) should be taken according to package directions if there are no bowel movements after 48 hours.    Wash / shower every day.  You may shower over the dressings   as they are waterproof.    It is good for closed incisions and even open wounds to be washed every day.  Shower every day.  Short baths are fine.  Wash the incisions and wounds clean with soap & water.    You may leave closed incisions open to air if it is dry.   You may cover the incision with clean gauze & replace it after  your daily shower for comfort.  TEGADERM:  You have clear gauze band-aid dressings over your closed incision(s).  Remove the dressings 3 days after surgery.    ACTIVITIES as tolerated:   You may resume regular (light) daily activities beginning the next day--such as daily self-care, walking, climbing stairs--gradually increasing activities as tolerated.  Control your pain so that you can walk an hour a day.  If you can walk 30 minutes without difficulty, it is safe to try more intense activity such as jogging, treadmill, bicycling, low-impact aerobics, swimming, etc. Save the most intensive and strenuous activity for last such as sit-ups, heavy lifting, contact sports, etc  Refrain from any heavy lifting or straining until you are off narcotics for pain control.   DO NOT PUSH THROUGH PAIN.  Let pain be your guide: If it hurts to do something, don't do it.  Pain is your body warning you to avoid that activity for another week until the pain goes down. You may drive when you are no longer taking prescription pain medication, you can comfortably wear a seatbelt, and you can safely maneuver your car and apply brakes. You may have sexual intercourse when it is comfortable.   FOLLOW UP in our office Please call [facility] at [phone] to set up an appointment to see your surgeon in the office for a follow-up appointment approximately 2-3 weeks after your surgery. Make sure that you call for this appointment the day you arrive home to insure a convenient appointment time.  9.  If you have disability of FMLA / Family leave forms, please bring the forms to the office for processing.  (do not give to your surgeon).  WHEN TO CALL US [phone]: Poor pain control Reactions / problems with new medications (rash/itching, nausea, etc)  Fever over 101.5 F (38.5 C) Inability to urinate Nausea and/or vomiting Worsening swelling or bruising Continued bleeding from incision. Increased pain, redness, or  drainage from the incision   The clinic staff is available to answer your questions during regular business hours (8:30am-5pm).  Please don't hesitate to call and ask to speak to one of our nurses for clinical concerns.   If you have a medical emergency, go to the nearest emergency room or call 911.  A surgeon from [facility] is always on call at the hospitals in [place]  [facility] [address], [address], [place], [place]  [address] ?  [address] [address], [place], [place]   [address] MAIN: [phone] ? TOLL FREE: [phone] ? FAX: [phone] [email or web address]  

## 2022-10-26 NOTE — Anesthesia Procedure Notes (Signed)
Procedure Name: Intubation Date/Time: 10/26/2022 8:00 AM  Performed by: Lavina Hamman, CRNAPre-anesthesia Checklist: Patient identified, Emergency Drugs available, Suction available, Patient being monitored and Timeout performed Patient Re-evaluated:Patient Re-evaluated prior to induction Oxygen Delivery Method: Circle system utilized Preoxygenation: Pre-oxygenation with 100% oxygen Induction Type: IV induction Ventilation: Mask ventilation without difficulty Laryngoscope Size: Mac and 3 Grade View: Grade III Tube type: Oral Tube size: 7.0 mm Number of attempts: 1 Airway Equipment and Method: Stylet Placement Confirmation: ETT inserted through vocal cords under direct vision, positive ETCO2, CO2 detector and breath sounds checked- equal and bilateral Secured at: 20 cm Tube secured with: Tape Dental Injury: Teeth and Oropharynx as per pre-operative assessment  Difficulty Due To: Difficulty was anticipated, Difficult Airway- due to anterior larynx, Difficult Airway- due to limited oral opening and Difficult Airway- due to dentition Comments: ATOI, short thyromental distance.

## 2022-10-26 NOTE — Transfer of Care (Signed)
Immediate Anesthesia Transfer of Care Note  Patient: ILLENE BOSWELL  Procedure(s) Performed: Procedure(s) with comments: LAPAROSCOPIC BILATERAL SPIGELIAN HERNIA, BILATERAL INGUINAL HERNIA  AND LEFT FEMORAL HERNIA REPAIR WITH MESH AND TAP BLOCK (Left) - 120 ROOM 5  Patient Location: PACU  Anesthesia Type:General  Level of Consciousness:  sedated, patient cooperative and responds to stimulation  Airway & Oxygen Therapy:Patient Spontanous Breathing and Patient connected to face mask oxgen  Post-op Assessment:  Report given to PACU RN and Post -op Vital signs reviewed and stable  Post vital signs:  Reviewed and stable  Last Vitals:  Vitals:   10/26/22 0543 10/26/22 0937  BP: 136/68 121/61  Pulse: (!) 51 (!) 59  Resp: 15 16  Temp: 36.6 C   SpO2: [id number]    Complications: No apparent anesthesia complications

## 2022-10-26 NOTE — Anesthesia Postprocedure Evaluation (Signed)
Anesthesia Post Note  Patient: REGGIE GABHART  Procedure(s) Performed: LAPAROSCOPIC BILATERAL SPIGELIAN HERNIA, BILATERAL INGUINAL HERNIA  AND LEFT FEMORAL HERNIA REPAIR WITH MESH AND TAP BLOCK (Left: Abdomen)     Patient location during evaluation: PACU Anesthesia Type: General Level of consciousness: awake and alert Pain management: pain level controlled Vital Signs Assessment: post-procedure vital signs reviewed and stable Respiratory status: spontaneous breathing, nonlabored ventilation, respiratory function stable and patient connected to nasal cannula oxygen Cardiovascular status: blood pressure returned to baseline and stable Postop Assessment: no apparent nausea or vomiting Anesthetic complications: yes  Encounter Notable Events  Notable Event Outcome Phase Comment  Difficult to intubate - expected  Intraprocedure Filed from anesthesia note documentation.    Last Vitals:  Vitals:   10/26/22 0945 10/26/22 1000  BP: (!) 104/55 106/60  Pulse: (!) 52 (!) 56  Resp: 11 11  Temp:    SpO2: 92% 96%    Last Pain:  Vitals:   10/26/22 0945  TempSrc:   PainSc: Asleep                 Sanjeev Main S

## 2022-10-26 NOTE — Anesthesia Preprocedure Evaluation (Addendum)
Anesthesia Evaluation  Patient identified by MRN, date of birth, ID band Patient awake    Reviewed: Allergy & Precautions, H&P , NPO status , Patient's Chart, lab work & pertinent test results  Airway Mallampati: II  TM Distance: >3 FB Neck ROM: Full    Dental no notable dental hx.    Pulmonary neg pulmonary ROS   Pulmonary exam normal breath sounds clear to auscultation       Cardiovascular hypertension, Normal cardiovascular exam Rhythm:Regular Rate:Normal     Neuro/Psych negative neurological ROS  negative psych ROS   GI/Hepatic Neg liver ROS,GERD  Medicated,,  Endo/Other  Hypothyroidism    Renal/GU Renal InsufficiencyRenal disease  negative genitourinary   Musculoskeletal negative musculoskeletal ROS (+)    Abdominal   Peds negative pediatric ROS (+)  Hematology negative hematology ROS (+)   Anesthesia Other Findings   Reproductive/Obstetrics negative OB ROS                             Anesthesia Physical Anesthesia Plan  ASA: 3  Anesthesia Plan: General   Post-op Pain Management: Tylenol PO (pre-op)* and Gabapentin PO (pre-op)*   Induction: Intravenous  PONV Risk Score and Plan: 3 and Ondansetron, Dexamethasone and Treatment may vary due to age or medical condition  Airway Management Planned: Oral ETT  Additional Equipment:   Intra-op Plan:   Post-operative Plan: Extubation in OR  Informed Consent: I have reviewed the patients History and Physical, chart, labs and discussed the procedure including the risks, benefits and alternatives for the proposed anesthesia with the patient or authorized representative who has indicated his/her understanding and acceptance.     Dental advisory given  Plan Discussed with: CRNA and Surgeon  Anesthesia Plan Comments:        Anesthesia Quick Evaluation

## 2022-10-26 NOTE — Op Note (Addendum)
10/26/2022  9:31 AM  PATIENT:  Alyssa Fernandez  82 y.o. female  Patient Care Team: Pcp, No as PCP - Huston Foley, MD as Consulting Physician (General Surgery) Lavena Bullion, DO as Consulting Physician (Gastroenterology)  PRE-OPERATIVE DIAGNOSIS:  LEFT SPIGELIAN VS INGUINAL HERNIA, POSSIBLE LEFT FLANK HERNIA  POST-OPERATIVE DIAGNOSIS:   BILATERAL SPIGELIAN HERNIAS BILATERAL INGUINAL HERNIAS LEFT FEMORAL HERNIA  PROCEDURE:   LAPAROSCOPIC BILATERAL SPIGELIAN HERNIAS REPAIR WITH MESH  LAPAROSCOPIC BILATERAL INGUINAL HERNIAS  REPAIR WITH MESH  LAPAROSCOPIC LEFT FEMORAL HERNIA REPAIR WITH MESH  TRANSVERSUS ABDOMINIS PLANE (TAP) BLOCK - BILATERAL   SURGEON:  Adin Hector, MD  ASSISTANT:  (n/a)   ANESTHESIA:  General endotracheal intubation anesthesia (GETA) and Regional TRANSVERSUS ABDOMINIS PLANE (TAP) nerve block -BILATERAL for perioperative & postoperative pain control at the level of the transverse abdominis & preperitoneal spaces along the flank at the anterior axillary line, from subcostal ridge to iliac crest under laparoscopic guidance provided with liposomal bupivacaine (Experel) 25m mixed with 25 mL of bupivicaine 0.25% with epinephrine  Estimated Blood Loss (EBL):   Total I/O In: -  Out: 100 [Blood:100].   (See anesthesia record)  Delay start of Pharmacological VTE agent (>24hrs) due to concerns of significant anemia, surgical blood loss, or risk of bleeding?:  no  DRAINS: (None)  SPECIMEN:  (no specimen)  DISPOSITION OF SPECIMEN:  Pathology and (not applicable)  COUNTS:  Sponge, needle, & instrument counts CORRECT  PLAN OF CARE: Discharge to home after PACU  PATIENT DISPOSITION:  PACU - hemodynamically stable.  INDICATION: Pleasant elderly woman with history of rectal prolapse status postrepair last year.  Noted swelling in left lower quadrant.  Spigelian versus inguinal hernia suspected.  Possible hernias on right lower quadrant as well.  She was  worried about some swelling on her left lateral flank but looks like eventration.  I recommended laparoscopic exploration and repair of hernias found.  The anatomy & physiology of the abdominal wall and pelvic floor was discussed.  The pathophysiology of hernias in the inguinal and pelvic region was discussed.  Natural history risks such as progressive enlargement, pain, incarceration & strangulation was discussed.   Contributors to complications such as smoking, obesity, diabetes, prior surgery, etc were discussed.    I feel the risks of no intervention will lead to serious problems that outweigh the operative risks; therefore, I recommended surgery to reduce and repair the hernia.  I explained laparoscopic techniques with possible need for an open approach.  I noted usual use of mesh to patch and/or buttress hernia repair  Risks such as bleeding, infection, abscess, need for further treatment, heart attack, death, and other risks were discussed.  I noted a good likelihood this will help address the problem.   Goals of post-operative recovery were discussed as well.  Possibility that this will not correct all symptoms was explained.  I stressed the importance of low-impact activity, aggressive pain control, avoiding constipation, & not pushing through pain to minimize risk of post-operative chronic pain or injury. Possibility of reherniation was discussed.  We will work to minimize complications.     An educational handout further explaining the pathology & treatment options was given as well.  Questions were answered.  The patient expresses understanding & wishes to proceed with surgery.  OR FINDINGS: On the left side, 3 x 2cm spigelian hernia incarcerated with some omentum.  Indirect inguinal and moderate size femoral hernia.  Some laxity in the direct space but no definite direct  space inguinal hernia.  No obturator hernia.  No evidence of any flank hernia on the left side of the abdomen.  On the  right side, smaller 2 x 1 cm spigelian hernia.  Moderate size indirect inguinal hernia containing large spermatic cord.  Mild laxity in femoral canal but no definite hernia.  Mild laxity direct space but no definite hernia.  No obturator hernia.  No evidence of hernia in upper abdomen or either flank or periumbilically.  DESCRIPTION:  Informed consent was confirmed.  The patient underwent general anaesthesia without difficulty.  The patient was positioned appropriately.  VTE prevention in place.  The patient's abdomen was clipped, prepped, & draped in a sterile fashion.  Surgical timeout confirmed our plan.  Peritoneal entry with a laparoscopic port was obtained using Varess spring needle entry technique in the left upper abdomen as the patient was positioned in reverse Trendelenburg.  I induced carbon dioxide insufflation.  No change in end tidal CO2 measurements.  Full symmetrical abdominal distention.  Initial port was carefully placed.  Camera inspection revealed no injury.  Extra port was carefully placed under direct laparoscopic visualization.  Exploration confirmed spigelian hernia on the left size of moderate size.  Smaller but definite right-sided spigelian hernia as well.  Probable femoral hernia on the left side and inguinal hernias indirect as well.  There is no evidence of any hernias in either flank nor periumbilically nor in the upper abdomen nor in the midline.  Decided proceed with a Tapp extraperitoneal approach to focus on the spigelian and umbilical and femoral hernias.  I made a transverse incision through the inferior umbilical fold.  I made a small transverse nick through the anterior rectus fascia contralateral to the inguinal hernia side and placed a 0-vicryl stitch through the fascia.  I placed a Hasson trocar into the preperitoneal plane.  Entry was clean.  We induced carbon dioxide insufflation. Camera inspection revealed no injury.  I used a 15m angled scope to bluntly free  the peritoneum off the infraumbilical anterior abdominal wall.  I created enough of a preperitoneal pocket to place 522mports into the right & left mid-abdomen into this preperitoneal cavity.  I focused attention on the LEFT pelvis since that was the dominant hernia side.   I used blunt & focused sharp dissection to free the peritoneum off the flank and down to the pubic rim.  I freed the anteriolateral bladder wall off the anteriolateral pelvic wall, sparing midline attachments.   I located a swath of peritoneum going into a hernia fascial defect at the left lower quadrant lateral to the rectus consistent with a spigelian hernia.  This was reduced.  Came down and reduced an indirect inguinal hernia as well as a moderate-sized femoral hernia.  Mild laxity in direct space but no direct inguinal hernia nor obturator hernia.   I gradually freed the peritoneal hernia sac off safely and reduced it into the preperitoneal space.  I freed the peritoneum off the round ligament.  I freed peritoneum off the retroperitoneum along the psoas muscle.  Inguinal canal cord lipoma was dissected away & removed.  I checked & assured hemostasis.  Patient had very thinned out tissues from prior surgery.  Did peritoneal repair on the left lateral peritoneum as well as a high ligation of the hernia sac running with the round ligament.  Used absorbable serrated V-Loc suture in a running fashion to good result.   I turned attention on the opposite  RIGHT pelvis.  I  did dissection in a similar, mirror-image fashion. The patient had definite indirect inguinal hernia.  Smaller but definite spigelian type hernia as well.   Inguinal canal cord lipoma was dissected away & removed.    I checked & assured hemostasis.     I chose sheets of lightweight polypropylene Bard Soft Mesh 15x15cm, one for each side.  I cut a single sigmoid-shaped slit ~6cm from a corner of each mesh.  I placed the meshes into the preperitoneal space & laid them as  overlapping diamonds such that at the inferior points, a 6x6 cm corner flap rested in the true anterolateral pelvis, covering the obturator & femoral foramina.   I allowed the bladder to return to the pubis, this helping tuck the corners of the mesh in the anteriolateral pelvis.  The medial corners overlapped each other across midline cephalad to the pubic rim.   Given the numerous hernias of moderate size, I placed a third 15x15cm mesh in the center as a vertical diamond.  The lateral wings of the mesh overlap across the direct spaces and internal rings where the dominant hernias were.  They were superior and lateral to both spigelian hernias with several centimeter overlap.  Because of the numerous hernias in numerous locations I placed a third mesh as a diamond in the midline.  The lateral wings help cover up the spigelian hernias overlap as well for better overlap of the spigelian hernias and reinforcement in the midline to avoid any direct space hernias or breakdown of the femoral or indirect inguinal hernias.  Given her poor tissues I did use secure strap tacks superiorly and laterally and along the midline to help secure the mesh as well to keep them from migrating or even training through her numerous hernias.     I held the hernia sacs cephalad & evacuated carbon dioxide.  I closed the fascia with absorbable suture.  I closed the skin using 4-0 monocryl stitch.  Sterile dressings were applied.   The patient was extubated & arrived in the PACU in stable condition..  I had discussed postoperative care with the patient in the holding area.  Instructions are written in the chart.  I discussed operative findings, updated the patient's status, discussed probable steps to recovery, and gave postoperative recommendations to the  patient's daughter, Dierdre Searles .  Recommendations were made.  Questions were answered.  She expressed understanding & appreciation.   Adin Hector, M.D.,  F.A.C.S. Gastrointestinal and Minimally Invasive Surgery [facility] [address] [place], [place] [address] [phone] Main / Paging  10/26/2022 9:31 AM

## 2022-10-26 NOTE — Interval H&P Note (Signed)
History and Physical Interval Note:  10/26/2022 7:14 AM  Alyssa Fernandez  has presented today for surgery, with the diagnosis of LEFT SPIGELIAN VS INGUINAL HERNIA, POSSIBLE LEFT FLANK HERNIA.  The various methods of treatment have been discussed with the patient and family. After consideration of risks, benefits and other options for treatment, the patient has consented to  Procedure(s) with comments: LAPAROSCOPIC LEFT AND POSSIBLE RIGHT INGUINAL HERNIA (Left) - 120 ROOM 5 POSSIBLE HERNIA REPAIR VENTRAL ADULT (N/A) as a surgical intervention.  The patient's history has been reviewed, patient examined, no change in status, stable for surgery.  I have reviewed the patient's chart and labs.  Questions were answered to the patient's satisfaction.    I have re-reviewed the the patient's records, history, medications, and allergies.  I have re-examined the patient.  I again discussed intraoperative plans and goals of post-operative recovery.  The patient agrees to proceed.  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    Priority: Medium    Rectal prolapse s/p robotic LAR/rectopexy 07/26/2022 07/26/2022   Decreased anal sphincter tone 06/21/2022   UTI (urinary tract infection) 03/22/2022   Internal hemorrhoids 03/22/2022   Anxiety and depression 12/14/2021   Epigastric pain 12/14/2021   Lumbar radiculopathy 05/27/2021   Grief 09/27/2020   Penetrating forearm wound, left, sequela 05/26/2020   Pain and swelling of right wrist 11/12/2019   Right wrist pain 11/09/2019   Wheezing 05/08/2019   Osteoporosis 01/30/2018   Atherosclerosis of aorta ([place]) 08/01/2017   CKD (chronic kidney disease) stage 3, GFR 30-59 ml/min (HCC)  08/01/2017   Acute pyelonephritis 04/13/2017   Chronic low back pain 05/04/2016   Easy bruising 12/02/2015   Mild early onset Alzheimer's dementia without behavioral disturbance, psychotic disturbance, mood disturbance, or anxiety ([place]) 09/17/2015   Encounter for immunization 06/03/2015   Fatigue 11/25/2014   Degenerative joint disease 05/26/2014   Hives 04/08/2014   Dyspnea 02/03/2014   Peripheral neuropathy ([place]) 02/05/2013   Bilateral foot pain 11/20/2012   Vaginal itching 06/26/2012   Low blood pressure 06/19/2012   Liver abscess 06/19/2012   Normocytic anemia 05/07/2012   Hypokalemia 05/06/2012   Fever 05/06/2012   Dehydration 05/06/2012   Rash 11/20/2011   Encounter for well adult exam with abnormal findings 11/16/2011   Anxiety 11/16/2011   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   Essential hypertension 02/24/2010   DERMATITIS, ATOPIC 01/20/2010   Edema 12/28/2009   Vitamin D deficiency 11/10/2009   UNS ADVRS EFF OTH RX MEDICINAL&BIOLOGICAL SBSTNC 11/10/2009   TACHYCARDIA 10/05/2009   OTHER POSTSURGICAL STATUS OTHER 07/21/2009   POSTMENOPAUSAL SYNDROME 03/09/2009   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/17/2008   INSOMNIA 06/17/2008   Hypothyroidism 02/12/2008   Polycystic kidney 02/12/2008   GERD 06/12/2007   Disorder of bone and cartilage 06/12/2007   Hyperlipidemia 03/25/2007    Past Medical History:  Diagnosis Date   Anxiety    Atherosclerosis of aorta ([place]) 08/01/2017   Cancer ([place])    skin cancer on leg   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   Dementia [facility])    Depression    GERD    Hepatic abscess 05/03/2012   History of kidney stones  Hyperlipidemia    Hypertension    HYPOTHYROIDISM    INSOMNIA    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    OSTEOPENIA    Polycystic kidney disease    RENAL FAILURE    Renal failure 02/12/2008   Qualifier: Diagnosis of   By: Niel Hummer MD, Lorinda Creed       VITAMIN D DEFICIENCY     Past Surgical History:  Procedure Laterality Date   ABDOMINAL HYSTERECTOMY     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     bladder operation, but per sling procedure     BUNIONECTOMY     CERVICAL DISC SURGERY     fx rt ankle     left temporal bx artery-negative 2      PARTIAL NEPHRECTOMY Left    percutaneous drainage of liver abscess  05/06/2012   PROCTOSCOPY N/A 07/26/2022   Procedure: RIGID PROCTOSCOPY;  Surgeon: Michael Boston, MD;  Location: [facility];  Service: General;  Laterality: N/A;   RECTOPEXY N/A 07/26/2022   Procedure: RECTOPEXY;  Surgeon: Michael Boston, MD;  Location: [facility];  Service: General;  Laterality: N/A;    Social History   Socioeconomic History   Marital status: Widowed    Spouse name: Not on file   Number of children: 1   Years of education: Not on file   Highest education level: Not on file  Occupational History   Occupation: Retired  Tobacco Use   Smoking status: Never   Smokeless tobacco: Never  Vaping Use   Vaping Use: Never used  Substance and Sexual Activity   Alcohol use: No   Drug use: No   Sexual activity: Not Currently    Birth control/protection: Post-menopausal  Other Topics Concern   Not on file  Social History Narrative   Widowed since December 2021; married for over 78 years.   Lives in a 2-story home with cat.   Right handed   No caffeine   Social Determinants of Health   Financial Resource Strain: Low Risk  (10/26/2021)   Overall Financial Resource Strain (CARDIA)    Difficulty of Paying Living Expenses: Not hard at all  Food Insecurity: No Food Insecurity (07/26/2022)   Hunger Vital Sign    Worried About Running Out of Food in the Last Year: Never true    Ran Out of Food in the Last Year: Never true  Transportation Needs: No Transportation Needs (07/26/2022)   PRAPARE - Hydrologist (Medical): No    Lack of Transportation (Non-Medical): No  Physical Activity: Sufficiently Active  (10/26/2021)   Exercise Vital Sign    Days of Exercise per Week: 5 days    Minutes of Exercise per Session: 30 min  Stress: No Stress Concern Present (10/26/2021)   [place]    Feeling of Stress : Not at all  Social Connections: Moderately Integrated (10/26/2021)   Social Connection and Isolation Panel [NHANES]    Frequency of Communication with Friends and Family: More than three times a week    Frequency of Social Gatherings with Friends and Family: Once a week    Attends Religious Services: More than 4 times per year    Active Member of [employer] or Organizations: No    Attends Music therapist: More than 4 times per year    Marital Status: Widowed  Intimate Partner Violence: Not At Risk (07/26/2022)   Humiliation, Afraid, Rape, and Kick questionnaire  Fear of Current or Ex-Partner: No    Emotionally Abused: No    Physically Abused: No    Sexually Abused: No    Family History  Problem Relation Age of Onset   Stroke Mother    Stomach cancer Sister    Colon cancer Neg Hx    Colon polyps Neg Hx    Esophageal cancer Neg Hx    Rectal cancer Neg Hx     Medications Prior to Admission  Medication Sig Dispense Refill Last Dose   ALPRAZolam (XANAX) 0.5 MG tablet 1 tab by mouth twice per day as needed 60 tablet 5 Past Month   amLODipine (NORVASC) 10 MG tablet Take 10 mg by mouth in the morning. (0800) HOLD FOR SBP LESS THAN 110   10/26/2022 at 0415   ascorbic acid (VITAMIN C) 500 MG tablet Take 500 mg by mouth in the morning. (0800)   10/25/2022   donepezil (ARICEPT) 10 MG tablet Take 1 tablet (10 mg total) by mouth at bedtime. (Patient taking differently: Take 10 mg by mouth at bedtime. (2000)) 90 tablet 3 10/25/2022   ferrous sulfate 325 (65 FE) MG tablet Take 1 tablet (325 mg total) by mouth daily. (Patient taking differently: Take 325 mg by mouth daily. (0800)) 30 tablet 2 10/25/2022   hydrochlorothiazide  (HYDRODIURIL) 25 MG tablet Take 25 mg by mouth in the morning. (0800)   10/25/2022   labetalol (NORMODYNE) 300 MG tablet Take 1 tablet (300 mg total) by mouth 2 (two) times daily. 180 tablet 3 10/26/2022 at 0415   polycarbophil (FIBERCON) 625 MG tablet Take 1 tablet (625 mg total) by mouth 2 (two) times daily. 60 tablet 2 Past Week   psyllium (HYDROCIL/METAMUCIL) 95 % PACK Take 1 packet by mouth daily as needed for mild constipation.   Past Week   rosuvastatin (CRESTOR) 20 MG tablet Take 20 mg by mouth daily at 8 pm. (2000)   10/25/2022   sertraline (ZOLOFT) 100 MG tablet 1 tab by mouth once daily 90 tablet 3 10/26/2022 at 0415   telmisartan (MICARDIS) 80 MG tablet Take 1 tablet (80 mg total) by mouth daily. 90 tablet 3 10/25/2022   traMADol (ULTRAM) 50 MG tablet Take 1-2 tablets (50-100 mg total) by mouth every 6 (six) hours as needed for moderate pain or severe pain. (Patient taking differently: Take 50 mg by mouth every 6 (six) hours as needed for moderate pain or severe pain.) 20 tablet 0 Past Month   vitamin B-12 (CYANOCOBALAMIN) 1000 MCG tablet Take 1 tablet (1,000 mcg total) by mouth daily. 90 tablet 3 10/25/2022   ARIPiprazole (ABILIFY) 5 MG tablet Take 1 tablet (5 mg total) by mouth daily. (Patient not taking: Reported on 10/23/2022) 90 tablet 3 Not Taking    Current Facility-Administered Medications  Medication Dose Route Frequency Provider Last Rate Last Admin   0.9 %  sodium chloride infusion   Intravenous Continuous Michael Boston, MD 10 mL/hr at 10/26/22 0618 Continued from Pre-op at 10/26/22 0618   bupivacaine liposome (EXPAREL) 1.3 % injection 266 mg  20 mL Infiltration Once Michael Boston, MD       ceFAZolin (ANCEF) IVPB 2g/100 mL premix  2 g Intravenous On Call to OR Michael Boston, MD       Chlorhexidine Gluconate Cloth 2 % PADS 6 each  6 each Topical Once Michael Boston, MD       And   Chlorhexidine Gluconate Cloth 2 % PADS 6 each  6 each Topical Once Michael Boston,  MD       dexamethasone  (DECADRON) injection 4 mg  4 mg Intravenous On Call to OR Michael Boston, MD       [START ON 10/27/2022] feeding supplement (ENSURE PRE-SURGERY) liquid 296 mL  296 mL Oral Once Michael Boston, MD       lactated ringers infusion   Intravenous Continuous Roderic Palau, MD         Allergies  Allergen Reactions   Codeine Swelling   Shellfish Allergy Swelling    Throat swells   Sulfonamide Derivatives Swelling   Zocor [Simvastatin] Other (See Comments)    Cognitive decrease    BP 136/68   Pulse (!) 51   Temp 97.9 F (36.6 C) (Oral)   Resp 15   Ht '5\' 3"'$  (1.6 m)   Wt 51.7 kg   SpO2 94%   BMI 20.19 kg/m   Labs: Results for orders placed or performed during the hospital encounter of 10/24/22 (from the past 48 hour(s))  Basic metabolic panel per protocol     Status: Abnormal   Collection Time: 10/24/22  8:21 AM  Result Value Ref Range   Sodium 140 135 - 145 mmol/L   Potassium 3.1 (L) 3.5 - 5.1 mmol/L   Chloride 101 98 - 111 mmol/L   CO2 30 22 - 32 mmol/L   Glucose, Bld 110 (H) 70 - 99 mg/dL    Comment: Glucose reference range applies only to samples taken after fasting for at least 8 hours.   BUN 23 8 - 23 mg/dL   Creatinine, Ser 1.18 (H) 0.44 - 1.00 mg/dL   Calcium 9.1 8.9 - 10.3 mg/dL   GFR, Estimated 46 (L) >60 mL/min    Comment: (NOTE) Calculated using the CKD-EPI Creatinine Equation (2021)    Anion gap 9 5 - 15    Comment: Performed at [facility], [place] [address], [place], [place] [address]  CBC per protocol     Status: None   Collection Time: 10/24/22  8:21 AM  Result Value Ref Range   WBC 6.4 4.0 - 10.5 K/uL   RBC 4.72 3.87 - 5.11 MIL/uL   Hemoglobin 13.7 12.0 - 15.0 g/dL   HCT 42.3 36.0 - 46.0 %   MCV 89.6 80.0 - 100.0 fL   MCH 29.0 26.0 - 34.0 pg   MCHC 32.4 30.0 - 36.0 g/dL   RDW 13.1 11.5 - 15.5 %   Platelets 219 150 - 400 K/uL   nRBC 0.0 0.0 - 0.2 %    Comment: Performed at [facility], [place] [address]  [place], [place] [address]    Imaging / Studies: No results found.   Adin Hector, M.D., F.A.C.S. Gastrointestinal and Minimally Invasive Surgery [facility] [address], [place], [place] [address] [phone] Main / Paging  10/26/2022 7:14 AM    Adin Hector

## 2022-10-26 NOTE — H&P (Signed)
10/26/2022    PROVIDER: Hollace Kinnier, MD  Patient Care Team: Biagio Borg, MD as PCP - General (Internal Medicine) Gerrit Heck, DO (Gastroenterology) Johney Maine, Adrian Saran, MD as Consulting Provider (Colon and Rectal Surgery)  [facility] MRN: [id number] DOB: 17-Aug-1941 DATE OF ENCOUNTER: 10/26/2022   Interval History:   The patient returns to the office after undergoing robotic low anterior rectosigmoid resection and rectopexy on 07/26/2022  Pathology: No adenomatous changes nor malignancy.  Patient had concern about pain in her abdomen and wished to be seen. She remains at assisted living at [facility] in [place]. Records note she is given Metamucil once a day for her bowel regimen. She thinks she gets Metamucil least every other day. She is moving her bowels once a day. No issues of diarrhea or leaking very much anymore. She does wear diapers still but mainly for urine leaking. She thought she saw something popping out her anus like a red bubble but it is gone away.  Her main concern is that she thought she feels some bulging in her left lower side. Maybe on her left flank as well. She thinks the lump on her left flank been there for years. The left lower area seems to pop in and out. Causes her some discomfort. She remains assisted living. She is walking and noticed some discomfort with that. Her daughter again is with her. No fevers or chills. No UTIs.  PRIOR NOTE 09/18/2022 Patient returns for set and postop visit now 6 weeks from rectosigmoid resection and rectopexy for chronic rectal prolapse in the setting of chronic diarrhea and decreased sphincter tone. She comes in with her daughter. She is not certain what she is taking at the assisted living. Her records note she is no longer on any Imodium and she is taking FiberCon twice a day. She usually has 1 bowel movement a day. Sometimes soft. Sometimes morbility. Minimal straining. No obvious prolapse. She does wear diapers just in  case but denies any major accidents.  PRIOR NOTE 08/17/2022 Returns for first postop visit. She got rather hypokalemic. This was aggressively replaced. She got rather anemic and needed to be transfused. Stabilized. Struggled still with some fecal incontinence since she had a lot preop with her chronic inflamed rectal prolapse. She ended up going to [facility] skilled facility. She was discharged from there and transitioning to assisted living. She is here with her daughter. She is here for a first postop follow-up 3 weeks out.  Patient main complaint is loose bowel movements/diarrhea with occasional overflow incontinence. Somewhat frustrating. She does not know if she was given fiber antidiarrheals. Daughter getting the med list out of the car and to see what she has been taking. Patient notes that its gotten a little easier but still having some urging and leaking.    Labs, Imaging and Diagnostic Testing:  Located in [place]' section of Epic EMR chart  PRIOR [facility] CLINIC NOTES:  Located in [place]' section of Epic EMR chart  SURGERY NOTES:  Located in [place]' section of Epic EMR chart  PATHOLOGY:  Located in [place]' section of Epic EMR chart  Physical Examination:   There is no height or weight on file to calculate BMI.  Constitutional: Not cachectic. Hygeine adequate.  Eyes: Normal extraocular movements. Sclera nonicteric Neuro: No major focal sensory defects. No major motor deficits. Psych: No severe agitation. No severe anxiety. Judgment & insight Adequate, Oriented x4, HENT: Normocephalic, Mucus membranes moist. No thrush.  Neck: Supple, No tracheal  deviation.  Chest: Good respiratory excursion. No audible wheezing CV: No major extremity edema Ext: No obvious deformity or contracture. Edema: not present. No cyanosis Skin: Warm and dry Musculoskeletal: Mobility: no assist device moving easily without restrictions  Abdomen: Incisions  Clean & dry with normal healing ridge Nontender. Soft. Nondistended. She does have some mild thinning in her left upper flank along the subcostal region but I do not feel any definite hernia there. Seems more some mild asymmetry eventration. In her left lower quadrant sort of above her inguinal region she does have a small bulging that is noticeable when she coughs. Seems more like it is a Spigelian hernia but it could be a possible inguinal hernia. Nothing on the right. Her Pfannenstiel incision is well-healed without hernias. Her right-sided port sites from her rectopexy are well-healed without any hernias there. I feel no obvious inguinal groin hernia.  Gen: Inguinal herniaI: No right inguinal hernia. Some bulging in left groin. Suspect spigelian over inguinal hernia. inguinal lymph nodes: without lymphadenopathy.   Rectal: ##################################  Perianal skin Clean with good hygiene  Pruritis ani: Not present Anal fissure: Not present Perirectal abscess/fistula Not present External hemorrhoids: Some anterior midline tissue going up into midline raphae.  There is no obvious prolapsing tissue with straining.  Sphincter tone Weak squeeze but mostly intact. Soft stool in rectal vault.  Other significant findings: No evidence of any recurrent prolapse.  She is wearing diapers that are clean and dry without any smearing or staining.  Patient examined with patient in decubitus position .  ###################################     Assessment and Plan:   Alyssa Fernandez is a 82 y.o. female recovering s/p tear rectosigmoid resection and suture rectopexy 07/26/2022.  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 It is not near any incisions. Rather odd. Some eventration of the left flank versus a possible hernia there but again no incisions. No port site Pfannenstiel or right inguinal/beginning hernias. Because she feels that is bothering her and getting a little bigger it is reasonable to consider repair. Will do a laparoscopic exploration repair of hernias found with mesh. Hopefully an outpatient surgery.  There are risks of surgery but compared to the rectal surgery they are low. They wish to be aggressive and proceed.  The anatomy & physiology of the abdominal wall and pelvic floor was discussed. The pathophysiology of hernias in the inguinal and pelvic region was discussed. Natural history risks such as progressive enlargement, pain, incarceration, and strangulation was discussed. Contributors to complications such as smoking, obesity, diabetes, prior surgery, etc were discussed.   I feel the risks of no intervention will lead to serious problems that outweigh the operative risks; therefore, I recommended surgery to reduce and repair the hernia. I explained laparoscopic techniques with possible need for an open approach. I noted usual use of mesh to patch and/or buttress hernia repair  Risks such as bleeding, infection, abscess, need for further treatment, injury to other organs, need for repair of tissues / organs, stroke, heart attack, death, and other risks were discussed. I noted a good likelihood this will help address the problem. Goals of post-operative recovery were discussed as well. Possibility that this will not correct all  symptoms was explained. I stressed the importance of low-impact activity, aggressive pain control, avoiding constipation, & not pushing through pain to minimize risk of post-operative chronic pain or injury. Possibility of reherniation was discussed. We will work to minimize complications.   An educational handout further  explaining the pathology & treatment options was given as well. Questions were answered. The patient expresses understanding & wishes to proceed with surgery.    Adin Hector, MD, FACS, MASCRS Esophageal, Gastrointestinal & Colorectal Surgery Robotic and Minimally Invasive Surgery  [facility] [id number], [place], [place] [address] [phone] Fax [phone] Main  CONTACT INFORMATION:  Weekday (9AM-5PM): Call [facility] main office at [phone]  Weeknight (5PM-9AM) or Weekend/Holiday: Check [email or web address] (password " [email or web address]") for General Surgery [facility] coverage  (Please, do not use SecureChat as it is not reliable communication to reach operating surgeons for immediate patient care given surgeries/outpatient duties/clinic/cross-coverage/off post-call which would lead to a delay in care.  Epic staff messaging available for outptient concerns, but may not be answered for 48 hours or more).    10/26/2022

## 2022-10-27 ENCOUNTER — Encounter (HOSPITAL_COMMUNITY): Payer: Self-pay | Admitting: Surgery

## 2022-11-14 DIAGNOSIS — K623 Rectal prolapse: Secondary | ICD-10-CM | POA: Diagnosis not present

## 2022-11-14 DIAGNOSIS — I129 Hypertensive chronic kidney disease with stage 1 through stage 4 chronic kidney disease, or unspecified chronic kidney disease: Secondary | ICD-10-CM | POA: Diagnosis not present

## 2022-11-14 DIAGNOSIS — Z48815 Encounter for surgical aftercare following surgery on the digestive system: Secondary | ICD-10-CM | POA: Diagnosis not present

## 2022-11-14 DIAGNOSIS — N183 Chronic kidney disease, stage 3 unspecified: Secondary | ICD-10-CM | POA: Diagnosis not present

## 2022-12-12 ENCOUNTER — Encounter: Payer: Self-pay | Admitting: Psychology

## 2022-12-12 ENCOUNTER — Ambulatory Visit: Payer: PPO | Admitting: Psychology

## 2022-12-12 DIAGNOSIS — F02A4 Dementia in other diseases classified elsewhere, mild, with anxiety: Secondary | ICD-10-CM | POA: Diagnosis not present

## 2022-12-12 DIAGNOSIS — R4189 Other symptoms and signs involving cognitive functions and awareness: Secondary | ICD-10-CM

## 2022-12-12 DIAGNOSIS — G479 Sleep disorder, unspecified: Secondary | ICD-10-CM | POA: Diagnosis not present

## 2022-12-12 DIAGNOSIS — G301 Alzheimer's disease with late onset: Secondary | ICD-10-CM | POA: Diagnosis not present

## 2022-12-12 DIAGNOSIS — F03A0 Unspecified dementia, mild, without behavioral disturbance, psychotic disturbance, mood disturbance, and anxiety: Secondary | ICD-10-CM

## 2022-12-12 DIAGNOSIS — F0280 Dementia in other diseases classified elsewhere without behavioral disturbance: Secondary | ICD-10-CM | POA: Diagnosis not present

## 2022-12-12 DIAGNOSIS — M8580 Other specified disorders of bone density and structure, unspecified site: Secondary | ICD-10-CM | POA: Insufficient documentation

## 2022-12-12 DIAGNOSIS — G308 Other Alzheimer's disease: Secondary | ICD-10-CM | POA: Diagnosis not present

## 2022-12-12 HISTORY — DX: Unspecified dementia, mild, without behavioral disturbance, psychotic disturbance, mood disturbance, and anxiety: F03.A0

## 2022-12-12 NOTE — Progress Notes (Unsigned)
NEUROPSYCHOLOGICAL EVALUATION Alyssa Fernandez  Date of Evaluation: December 12, 2022  Reason for Referral:   Alyssa Fernandez is a 82 y.o. right-handed Caucasian female referred by Alyssa Kays, PA-C, to characterize her current cognitive functioning and assist with diagnostic clarity and treatment planning in the context of subjective cognitive decline and concern for an underlying neurodegenerative condition.   Assessment and Plan:   Clinical Impression(s): Alyssa Fernandez pattern of performance is suggestive of severe impairment surrounding essentially all aspects of learning and memory. An additional isolated impairment was exhibited across a line orientation task. However, other visuospatial tasks were appropriate. Performances were also appropriate relative to age-matched peers across processing speed, basic attention, cognitive flexibility, and expressive language. Functionally, Alyssa Fernandez resides in an assisted living facility. This facility manages medications (there were issues prior to this) and her daughter has fully taken over all financial and bill paying responsibilities. Alyssa Fernandez also no longer drives and her daughter reported that prior to stopping, Alyssa Fernandez would return home with various dents in her car without recollection of what happened or caused said dents. 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 However, given some appropriate scores across several cognitive domains, I do feel she remains towards the mild end of this spectrum presently.   The etiology for her mild dementia presentation is somewhat unclear. However, I do have concerns for an underlying neurodegenerative illness, namely Alzheimer's disease. Across memory testing, Alyssa Fernandez did not benefit from repeated exposure to novel information and was fully amnestic (i.e., 0% retention)  across all memory testing after a brief delay. Across recognition testing, she became frustrated due to poor performance and ultimately refused to complete one task. The other two similar tasks exhibited variable performances; however, she made comments surrounding guessing across her stronger performance, suggesting that this may be an inflation of true abilities. Overall, memory performances suggest rapid forgetting and an evolving and already quite severe storage impairment, both of which are the hallmark memory characteristics of this illness. Intact performances across the majority of non-memory cognitive abilities is certainly encouraging and would suggest that this disease process remains in early stages should it truly be present.   Unfortunately, I cannot offer a better explanation for profound memory impairment outside of concerns for Alzheimer's disease at the present time. Alyssa Fernandez does not display behavioral or cognitive characteristics suggestive of Lewy body disease, another more rare parkinsonian condition, or frontotemporal lobar degeneration. While her most recent MRI revealed mild to moderate vascular disease, this would not create profoundly amnestic memory performances and cannot explain these results. Likewise, while she did report ongoing anxiety across mood-related questionnaires, this also would not explain the severity of memory dysfunction. Continued medical monitoring will be important moving forward.   Recommendations: If there is a desire to track cognitive decline over time, a repeat neuropsychological evaluation in 18-24 months could be considered.  Alyssa Fernandez has already been prescribed a medication aimed to address memory loss and concerns surrounding Alzheimer's disease (i.e., donepezil/Aricept). She is encouraged to continue taking this medication as prescribed. It is important to highlight that this medication has been shown to slow functional decline in some individuals.  There is no current treatment which can stop or reverse cognitive decline when caused by a neurodegenerative illness.   Alyssa Fernandez is encouraged to speak with her prescribing physician regarding medication adjustments to optimally manage psychiatric symptoms (especially  anxiety).  Performance across neurocognitive testing is not a strong predictor of an individual's safety operating a motor vehicle. Should her family wish to pursue a formalized driving evaluation, they could reach out to the following agencies: [facility] [employer] [facility] in [place]: [phone] [facility]: [phone] [facility]: [phone] [facility]: [phone] or [phone]  It will be important for Alyssa Fernandez to have another person with her when in situations where she may need to process information, weigh the pros and cons of different options, and make decisions, in order to ensure that she fully understands and recalls all information to be considered.  If not already done, Alyssa Fernandez and her family may want to discuss her wishes regarding durable power of attorney and medical decision making, so that she can have input into these choices. If they require legal assistance with this, long-term care resource access, or other aspects of estate planning, they could reach out to [employer] at [phone] for a free consultation. Additionally, they may wish to discuss future plans for caretaking and seek out community options for in home/residential care should they become necessary.  Alyssa Fernandez is encouraged to attend to lifestyle factors for brain health (e.g., regular physical exercise, good nutrition habits and consideration of the MIND-DASH diet, regular participation in cognitively-stimulating activities, and general stress management techniques), which are likely to have benefits for both emotional adjustment and cognition. In fact, in addition to promoting good general health,  regular exercise incorporating aerobic activities (e.g., brisk walking, jogging, cycling, etc.) has been demonstrated to be a very effective treatment for depression and stress, with similar efficacy rates to both antidepressant medication and psychotherapy. Optimal control of vascular risk factors (including safe cardiovascular exercise and adherence to dietary recommendations) is encouraged. Continued participation in activities which provide mental stimulation and social interaction is also recommended.   Important information should be provided to Ms. Cassidy in written format in all instances. This information should be placed in a highly frequented and easily visible location within her home to promote recall. External strategies such as written notes in a consistently used memory journal, visual and nonverbal auditory cues such as a calendar on the refrigerator or appointments with alarm, such as on a cell phone, can also help maximize recall.  Review of Records:   Ms. Weigel was seen by [facility]andez Alyssa Kays, PA-C) on 03/29/2022 for an evaluation of memory loss. At that time, her daughter reported ongoing memory decline for the past two years. However, deficits had seemed to have accelerated during the past 3-4 months. Examples included rapid forgetting, trouble recalling recent conversations, and being far more reliant on written notes. Records also suggest some repetition in conversation. At that time, Ms. Harlacher was living alone and trouble with ADLs were denied. Performance on a brief cognitive screening instrument (MOCA) was 16/30. Her PCP had previously placed her on donepezil. Ultimately, Ms. Vigeant was referred for a comprehensive neuropsychological evaluation to characterize her cognitive abilities and to assist with diagnostic clarity and treatment planning.  Head CT on 12/03/2010 in the context of a fall was negative. It did suggest mild age-related atrophy. Brain MRI on 04/26/2022  revealed generalized volume loss of unspecified severity. It also suggested mild to moderate microvascular ischemic disease.   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   Severe by CT April 2012   Degenerative joint disease 05/26/2014   Dehydration 05/06/2012   Dermatitis 01/20/2010   Dyspnea 02/03/2014   Easy bruising 12/02/2015   Epigastric pain 12/14/2021   Essential hypertension 02/24/2010   Fatigue 11/25/2014   Generalized anxiety disorder 11/16/2011   GERD    Hepatic abscess 05/03/2012   History of kidney stones    History of skin cancer    leg   Hives 04/08/2014   Hyperlipidemia    Hypokalemia 05/06/2012   Hypothyroidism 02/12/2008   Insomnia    Internal hemorrhoids 03/22/2022   Irritable bowel syndrome with diarrhea 10/16/2022   Liver abscess 06/19/2012   Low blood pressure 06/19/2012   Lumbar radiculopathy 05/27/2021   Major depressive disorder 12/14/2021   Menopausal and postmenopausal disorder 03/09/2009   Normocytic anemia 05/07/2012   Osteopenia    Osteoporosis 01/30/2018   Penetrating forearm wound, left 05/16/2020   Peripheral neuropathy 02/05/2013   Polycystic kidney 02/12/2008   Pruritus ani 08/17/2022   Rash 11/20/2011   Rectal prolapse s/p robotic LAR/rectopexy 07/26/2022   Right wrist pain 11/09/2019   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   Tachycardia, unspecified 10/05/2009   UTI (urinary tract infection) 03/22/2022   Vaginal itching 06/26/2012   Vitamin D deficiency 11/10/2009    Past Surgical History:  Procedure Laterality Date   ABDOMINAL HYSTERECTOMY     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      bladder operation, but per sling procedure     BUNIONECTOMY     CERVICAL DISC SURGERY     fx rt ankle     INGUINAL HERNIA REPAIR Left 10/26/2022   Procedure: LAPAROSCOPIC BILATERAL SPIGELIAN HERNIA, BILATERAL INGUINAL HERNIA  AND LEFT FEMORAL HERNIA REPAIR WITH MESH AND TAP BLOCK;  Surgeon: Karie Soda, MD;  Location: [facility];  Service: General;  Laterality: Left;  120 ROOM 5   left temporal bx artery-negative 2      PARTIAL NEPHRECTOMY Left    percutaneous drainage of liver abscess  05/06/2012   PROCTOSCOPY N/A 07/26/2022   Procedure: RIGID PROCTOSCOPY;  Surgeon: Karie Soda, MD;  Location: [facility];  Service: General;  Laterality: N/A;   RECTOPEXY N/A 07/26/2022   Procedure: RECTOPEXY;  Surgeon: Karie Soda, MD;  Location: [facility];  Service: General;  Laterality: N/A;    Current Outpatient Medications:    ALPRAZolam (XANAX) 0.5 MG tablet, 1 tab by mouth twice per day as needed, Disp: 60 tablet, Rfl: 5   amLODipine (NORVASC) 10 MG tablet, Take 10 mg by mouth in the morning. (0800) HOLD FOR SBP LESS THAN 110, Disp: , Rfl:    ARIPiprazole (ABILIFY) 5 MG tablet, Take 1 tablet (5 mg total) by mouth daily. (Patient not taking: Reported on 10/23/2022), Disp: 90 tablet, Rfl: 3   ascorbic acid (VITAMIN C) 500 MG tablet, Take 500 mg by mouth in the morning. (0800), Disp: , Rfl:    donepezil (ARICEPT) 10 MG tablet, Take 1 tablet (10 mg total) by mouth at bedtime. (Patient taking differently: Take 10 mg by mouth at bedtime. (2000)), Disp: 90 tablet, Rfl: 3   ferrous sulfate 325 (65 FE) MG tablet, Take 1 tablet (325 mg total) by mouth daily. (Patient taking differently: Take 325 mg by mouth daily. (0800)), Disp: 30 tablet, Rfl: 2   hydrochlorothiazide (HYDRODIURIL)  25 MG tablet, Take 25 mg by mouth in the morning. (0800), Disp: , Rfl:    labetalol (NORMODYNE) 300 MG tablet, Take 1 tablet (300 mg total) by mouth 2 (two) times daily., Disp: 180 tablet, Rfl: 3   psyllium (HYDROCIL/METAMUCIL) 95 % PACK, Take 1  packet by mouth daily as needed for mild constipation., Disp: , Rfl:    rosuvastatin (CRESTOR) 20 MG tablet, Take 20 mg by mouth daily at 8 pm. (2000), Disp: , Rfl:    sertraline (ZOLOFT) 100 MG tablet, 1 tab by mouth once daily, Disp: 90 tablet, Rfl: 3   telmisartan (MICARDIS) 80 MG tablet, Take 1 tablet (80 mg total) by mouth daily., Disp: 90 tablet, Rfl: 3   traMADol (ULTRAM) 50 MG tablet, Take 1-2 tablets (50-100 mg total) by mouth every 6 (six) hours as needed for moderate pain or severe pain., Disp: 20 tablet, Rfl: 0   vitamin B-12 (CYANOCOBALAMIN) 1000 MCG tablet, Take 1 tablet (1,000 mcg total) by mouth daily., Disp: 90 tablet, Rfl: 3  Clinical Interview:   The following information was obtained during a clinical interview with Ms. Degraff and her daughter prior to cognitive testing.  Cognitive Symptoms: Decreased short-term memory: Denied. Her daughter expressed ongoing concerns, stating that Ms. Hyder is repetitive in conversation, seems to forget information quickly, and has trouble recalling names of individuals. Difficulties were said to be present for the past 2-3 years and do seem to have gradually worsened over time.  Decreased long-term memory: Denied. Decreased attention/concentration: Denied. Reduced processing speed: Endorsed. She reported sometimes feeling somewhat mentally foggy.  Difficulties with executive functions: Denied. Her daughter reported some personality changes generally surrounding emotional outbursts. Based upon her description, Ms. Hopkins appears to exhibit notable frustration surrounding cognitive decline or the perception that some of her freedoms are being taken from her. This can manifest in outbursts or unexpected crying spells. These experiences a normally brief in nature.  Difficulties with emotion regulation: Denied (see above for her daughter's perception). Difficulties with receptive language: Denied. Difficulties with word finding: Denied. Her daughter  reported ongoing word finding difficulties.  Decreased visuoperceptual ability: Denied.  Difficulties completing ADLs: Endorsed. Since meeting with Ms. Wertman in August 2023, Ms. Paradiso has been moved into [facility]. This facility manages her medications. Her daughter noted that there were medication adherence concerns prior to this. Her daughter has fully taken over financial and bill paying responsibilities. Ms. Carder no longer drives. Her daughter noted that prior to Ms. Harbin stopping, she would come home with dents in her car and would not be able to explain what happened or when they occurred.    Additional Medical History: History of traumatic brain injury/concussion: Denied. History of stroke: Denied. History of seizure activity: Denied. History of known exposure to toxins: Denied. Symptoms of chronic pain: Denied. Experience of frequent headaches/migraines: Denied. Frequent instances of dizziness/vertigo: Denied.  Sensory changes: She reported needing hearing aids due to ongoing hearing loss. Her daughter reminded her that she already has hearing aids in her nightstand, along with an adequate supply of batteries. Ms. Galdamez appeared surprised to hear/learn this. Other sensory changes/difficulties (e.g., vision, taste, smell) were denied.  Balance/coordination difficulties: Denied. She reported needing to be more careful while ambulating but denied any recent falls. She also denied one side of her body seeming weaker or less stable relative to the other.  Other motor difficulties: Denied.  Sleep History: Estimated hours obtained each night: 8 hours.  Difficulties falling asleep: Denied. Difficulties  staying asleep: Denied. Feels rested and refreshed upon awakening: Endorsed.  History of snoring: Denied. History of waking up gasping for air: Denied. Witnessed breath cessation while asleep: Denied.  History of vivid dreaming: Denied. Excessive movement while asleep:  Denied. Instances of acting out her dreams: Denied.  Psychiatric/Behavioral Health History: Depression: Medical records suggest a fairly longstanding history of depression with prior medication assistance. Ms. Badders acknowledged that depression has "always" been an issue but did describe her current mood as "fine, I'm a happy camper." Her daughter did note that during some emotional outbursts (see above), Ms. Human will make statements such as "I wish I'd just die." No current or remote suicidal ideation, intent, or plan was reported. This was believed to be situation and frustration based. Her daughter also noted that Ms. Michaelson becomes easily fixated on a particular issue and can "work herself up" to the point where she is very upset and emotional (e.g., not being able to get her hair done because her daughter does not wish for her to ride with another resident who may have dementia and should not be driving herself).  Anxiety: Medical records suggest a fairly longstanding history of generalized anxious distress. Acutely, Ms. Beckles expressed some stress/anxiety surrounding her recent move to assisted living over the past several months.  Mania: Denied. Trauma History: Denied. Visual/auditory hallucinations: Denied. Delusional thoughts: Denied.  Tobacco: Denied. Alcohol: She denied current alcohol consumption as well as a history of problematic alcohol abuse or dependence.  Recreational drugs: Denied.  Family History: Problem Relation Age of Onset   Stroke Mother    Stomach cancer Sister    Bipolar disorder Brother    Memory loss Other        grandparent   Colon cancer Neg Hx    Colon polyps Neg Hx    Esophageal cancer Neg Hx    Rectal cancer Neg Hx    This information was confirmed by [name].  Academic/Vocational History: Highest level of educational attainment: 14 years. She graduated from high school and completed two additional years of college. She did not earn an Associate's  degree. She described herself as a good (A/B, rare C) student in academic settings. Math was noted as likely relative weakness.  History of developmental delay: Denied. History of grade repetition: Denied. Enrollment in special education courses: Denied. History of LD/ADHD: Denied.  Employment: Retired. She previously worked as an Airline pilot.   Evaluation Results:   Behavioral Observations: Ms. Erck was accompanied by her daughter, arrived to her appointment on time, and was appropriately dressed and groomed. She appeared alert and oriented. Observed gait and station were within normal limits. Gross motor functioning appeared intact upon informal observation and no abnormal movements (e.g., tremors) were noted. Her affect was generally relaxed and positive, but did range appropriately given the subject being discussed during the clinical interview. Spontaneous speech was fluent and word finding difficulties were not observed during the clinical interview. Thought processes were largely coherent, organized, and normal in content. Some repetition was noted, as well as instances where she would contradict a previous statement without her apparent knowledge. Insight into her cognitive difficulties appeared poor in that se largely denied all cognitive concerns despite objective testing suggestive severe memory impairment.   During testing, sustained attention was appropriate. Ms. Sandner was fairly repetitive with the psychometrist, stating that she had recently moved into [place] numerous times with no apparent knowledge that this had already been shared. Task engagement was largely adequate initially. She did  become frustrated across memory recognition/consolidation testing and strongly refused to complete certain aspects of testing. These tasks unfortunately had to be discontinued. Due to waning testing tolerance and increased fatigue, the evaluation was abbreviated in response. Overall, Ms. Arseneau was  cooperative with the clinical interview and subsequent testing procedures.   Adequacy of Effort: The validity of neuropsychological testing is limited by the extent to which the individual being tested may be assumed to have exerted adequate effort during testing. Ms. Rudden expressed her intention to perform to the best of her abilities and exhibited adequate task engagement and persistence. Performance across a single stand-alone performance validity measure was within expectation. As such, the results of the current evaluation are believed to be a valid representation of Ms. Montanari's current cognitive functioning.  Test Results: Ms. Moten was poorly oriented at the time of the current evaluation. She incorrectly stated her age ("47") and was unable to provide an address outside of stating that she moved to [place]. She was also unable to state the current date, day of the week, time, or name of the current clinic.   Intellectual abilities based upon educational and vocational attainment were estimated to be in the average range. Premorbid abilities were estimated to be within the average range based upon a single-word reading test.   Processing speed was average. Basic attention was average. More complex attention (e.g., working memory) was unable to be assessed due to fatigue and limited testing tolerance. Cognitive flexibility was average. Other aspects of executive functioning were unable to be assessed.  Assessed receptive language abilities were unable to be directly assessed. Broadly speaking, Ms. Lahm did not exhibit prominent difficulties comprehending task instructions and answered all questions asked of her appropriately. Assessed expressive language (e.g., verbal fluency and confrontation naming) was variable but overall appropriate, ranging from the below average to well above average normative ranges.     Assessed visuospatial/visuoconstructional abilities were average outside of poor  performance across a line orientation task.    Learning (i.e., encoding) of novel verbal information was exceptionally low. Spontaneous delayed recall (i.e., retrieval) of previously learned information was also exceptionally low. Retention rates were 0% across a story learning task, 0% across a list learning task, and 0% across a figure drawing task. Performance across recognition tasks was variable but overall below expectation, suggesting limited evidence for information consolidation likely inflated by self-reported guessing.   Results of emotional screening instruments suggested that recent symptoms of generalized anxiety were in the mild range overall, while symptoms of depression were within normal limits. A screening instrument assessing recent sleep quality suggested the presence of minimal sleep dysfunction.  Tables of Scores:   Note: This summary of test scores accompanies the interpretive report and should not be considered in isolation without reference to the appropriate sections in the text. Descriptors are based on appropriate normative data and may be adjusted based on clinical judgment. Terms such as "Within Normal Limits" and "Outside Normal Limits" are used when a more specific description of the test score cannot be determined.       Percentile - Normative Descriptor > 98 - Exceptionally High 91-97 - Well Above Average 75-90 - Above Average 25-74 - Average 9-24 - Below Average 2-8 - Well Below Average < 2 - Exceptionally Low       Orientation:      Raw Score Percentile   NAB Orientation, Form 1 21/29 --- ---       Cognitive Screening:  Raw Score Percentile   SLUMS: 16/30 --- ---       RBANS, Form A: Standard Score/ Scaled Score Percentile   Total Score 67 1 Exceptionally Low  Immediate Memory 57 <1 Exceptionally Low    List Learning 3 1 Exceptionally Low    Story Memory 3 1 Exceptionally Low  Visuospatial/Constructional 75 5 Well Below Average    Figure Copy  10 50 Average    Line Orientation 6/20 <2 Exceptionally Low  Language 97 42 Average    Picture Naming 10/10 >75 Above Average    Semantic Fluency 8 25 Average  Attention 103 58 Average    Digit Span 10 50 Average    Coding 11 63 Average  Delayed Memory 40 <1 Exceptionally Low    List Recall 0/10 <2 Exceptionally Low    List Recognition Discontinued (pt refused) --- ---    Story Recall 1 <1 Exceptionally Low    Story Recognition 9/12 29-52 Average    Figure Recall 1 <1 Exceptionally Low    Figure Recognition 1/8 <1 Exceptionally Low        Intellectual Functioning:      Standard Score Percentile   Test of Premorbid Functioning: 94 34 Average       Attention/Executive Function:     Trail Making Test (TMT): Raw Score (T Score) Percentile     Part A 35 secs.,  1 error (52) 58 Average    Part B 120 secs.,  3 errors (48) 42 Average        Language:     Verbal Fluency Test: Raw Score (Scaled Score) Percentile     Phonemic Fluency (CFL) 19 (6) 9 Below Average    Category Fluency 34 (10) 50 Average  *Based on [employer] Older Normative Studies (MOANS)          NAB Language Module, Form 1: T Score Percentile     Naming 30/31 (63) 91 Well Above Average       Visuospatial/Visuoconstruction:      Raw Score Percentile   Clock Drawing: 7/10 --- Within Normal Limits       Mood and Personality:      Raw Score Percentile   Geriatric Depression Scale: 7 --- Within Normal Limits  Geriatric Anxiety Scale: 20 --- Mild    Somatic 5 --- Minimal    Cognitive 11 --- Severe    Affective 4 --- Mild       Additional Questionnaires:      Raw Score Percentile   PROMIS Sleep Disturbance Questionnaire: 17 --- None to Slight   Informed Consent and Coding/Compliance:   The current evaluation represents a clinical evaluation for the purposes previously outlined by the referral source and is in no way reflective of a forensic evaluation.   Ms. Simpson was provided with a verbal description of the  nature and purpose of the present neuropsychological evaluation. Also reviewed were the foreseeable risks and/or discomforts and benefits of the procedure, limits of confidentiality, and mandatory reporting requirements of this provider. The patient was given the opportunity to ask questions and receive answers about the evaluation. Oral consent to participate was provided by the patient.   This evaluation was conducted by Newman Nickels, Ph.D., ABPP-CN, board certified clinical neuropsychologist. Ms. Cousineau completed a clinical interview with Dr. Milbert Coulter, billed as one unit [phone], and 90 minutes of cognitive testing and scoring, billed as one unit [phone] and two additional units [phone]. Psychometrist Wallace Keller, B.S., assisted Dr. Milbert Coulter with test  administration and scoring procedures. As a separate and discrete service, Dr. Melvyn Novas spent a total of 160 minutes in interpretation and report writing billed as one unit [phone] and two units [phone].

## 2022-12-12 NOTE — Progress Notes (Signed)
   Psychometrician Note   Cognitive testing was administered to Alyssa Fernandez by Wallace Keller, B.S. (psychometrist) under the supervision of Dr. Newman Nickels, Ph.D., licensed psychologist on 12/12/2022. Ms. Mannina did not appear overtly distressed by the testing session per behavioral observation or responses across self-report questionnaires. Rest breaks were offered.    The battery of tests administered was selected by Dr. Newman Nickels, Ph.D. with consideration to Ms. Siracusa's current level of functioning, the nature of her symptoms, emotional and behavioral responses during interview, level of literacy, observed level of motivation/effort, and the nature of the referral question. This battery was communicated to the psychometrist. Communication between Dr. Newman Nickels, Ph.D. and the psychometrist was ongoing throughout the evaluation and Dr. Newman Nickels, Ph.D. was immediately accessible at all times. Dr. Newman Nickels, Ph.D. provided supervision to the psychometrist on the date of this service to the extent necessary to assure the quality of all services provided.    JESSEE NEWNAM will return within approximately 1-2 weeks for an interactive feedback session with Dr. Milbert Coulter at which time her test performances, clinical impressions, and treatment recommendations will be reviewed in detail. Ms. Scharfenberg understands she can contact our office should she require our assistance before this time.  A total of 90 minutes of billable time were spent face-to-face with Ms. Trainer by the psychometrist. This includes both test administration and scoring time. Billing for these services is reflected in the clinical report generated by Dr. Newman Nickels, Ph.D.  This note reflects time spent with the psychometrician and does not include test scores or any clinical interpretations made by Dr. Milbert Coulter. The full report will follow in a separate note.

## 2022-12-13 ENCOUNTER — Encounter: Payer: Self-pay | Admitting: Psychology

## 2022-12-15 ENCOUNTER — Ambulatory Visit: Payer: PPO | Admitting: Physician Assistant

## 2022-12-19 ENCOUNTER — Ambulatory Visit: Payer: PPO | Admitting: Psychology

## 2022-12-19 DIAGNOSIS — F02A4 Dementia in other diseases classified elsewhere, mild, with anxiety: Secondary | ICD-10-CM

## 2022-12-19 DIAGNOSIS — G301 Alzheimer's disease with late onset: Secondary | ICD-10-CM | POA: Diagnosis not present

## 2022-12-19 NOTE — Progress Notes (Signed)
   Neuropsychology Feedback Session Eligha Bridegroom. [facility] [place] Department of Neurology  Reason for Referral:   [name] is a 82 y.o. right-handed Caucasian female referred by Marlowe Kays, PA-C, to characterize her current cognitive functioning and assist with diagnostic clarity and treatment planning in the context of subjective cognitive decline and concern for an underlying neurodegenerative condition.  Feedback:   [name] completed a comprehensive neuropsychological evaluation on 12/12/2022. Please refer to that encounter for the full report and recommendations. Briefly, results suggested severe impairment surrounding essentially all aspects of learning and memory. An additional isolated impairment was exhibited across a line orientation task. However, other visuospatial tasks were appropriate. The etiology for her mild dementia presentation is somewhat unclear. However, I do have concerns for an underlying neurodegenerative illness, namely Alzheimer's disease. Across memory testing, Ms. Worst did not benefit from repeated exposure to novel information and was fully amnestic (i.e., 0% retention) across all memory testing after a brief delay. Across recognition testing, she became frustrated due to poor performance and ultimately refused to complete one task. The other two similar tasks exhibited variable performances; however, she made comments surrounding guessing across her stronger performance, suggesting that this may be an inflation of true abilities. Overall, memory performances suggest rapid forgetting and an evolving and already quite severe storage impairment, both of which are the hallmark memory characteristics of this illness. Intact performances across the majority of non-memory cognitive abilities is certainly encouraging and would suggest that this disease process remains in early stages should it truly be present.   [name] was accompanied by her daughter  during the current feedback session. Content of the current session focused on the results of her neuropsychological evaluation. [name] was given the opportunity to ask questions and her questions were answered. She was encouraged to reach out should additional questions arise. A copy of her report was provided at the conclusion of the visit.      Greater than 31 minutes were spent preparing for, conducting, and documenting the current feedback session with [name], billed as one unit [phone].

## 2022-12-28 ENCOUNTER — Ambulatory Visit: Payer: PPO | Admitting: Physician Assistant

## 2022-12-28 ENCOUNTER — Encounter: Payer: Self-pay | Admitting: Physician Assistant

## 2022-12-28 VITALS — BP 86/54 | HR 58 | Resp 20 | Ht 63.5 in | Wt 118.0 lb

## 2022-12-28 DIAGNOSIS — G301 Alzheimer's disease with late onset: Secondary | ICD-10-CM

## 2022-12-28 DIAGNOSIS — F02A4 Dementia in other diseases classified elsewhere, mild, with anxiety: Secondary | ICD-10-CM

## 2022-12-28 NOTE — Progress Notes (Signed)
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 Patient is currently on donepezil 10 mg daily as per PCP. MRI brain personally reviewed was remarkable for mild to moderate chronic microvascular ischemic changes with parenchymal volume loss and mild atrophy.  Memory is stable.  She is now a resident of [facility] at the living facility, and she is enjoying the activities that day facility provides.  She no longer drives.    Follow up in 6   months. Repeat neuropsychological evaluation in 18 to 24 months if there is a desire to try cognitive decline over time Recommend good control of her cardiovascular risk factors Continue donepezil 10 mg daily as per PCP Continue to control mood as per PCP    Subjective:    This patient is accompanied in the office by her daughter who supplements the history.  Previous records as well as any outside records available were reviewed prior to todays visit. Patient was last seen on 03/27/2022, at which time her MoCA was 16/30.    Any changes in memory since last visit?  "Maybe better, participating more in the activities of ALF".  She has joined a "nice click of ladies ".  She continues to have trouble understanding some instructions or statements and remembering recent conversations and names. Writes a Licensed conveyancer and calendar.  repeats oneself?  Endorsed Disoriented when walking into a room?  Patient denies except occasionally not remembering what patient came to the room for   Leaving objects in unusual places?    denies  Daughter reports she misplaces  things.   Wandering behavior?  denies   Any personality changes since last visit?   "she has some good and bad days.  Only one meltdown when she first moved but no recurrences " Any worsening depression?:  denies   Hallucinations or paranoia?  denies   Seizures?    denies    Any sleep changes? She is now sleeping well.  Denies vivid dreams, REM behavior or sleepwalking   Sleep apnea?   denies   Any hygiene concerns?  denies   Independent of bathing and dressing?  Endorsed  Does the patient needs help with medications? Facility is in charge   Who is in charge of the finances? Daughter  is in charge    Any changes in appetite?  denies Patient have trouble swallowing?  denies   Does the patient cook?  Any kitchen accidents such as leaving the stove on? Patient denies   Any headaches?   denies   Chronic back pain  denies   Ambulates with difficulty?  Walks 1500 steps a day moving to 3000 Recent falls or head injuries? denies     Unilateral weakness, numbness or tingling?  denies   Any tremors?  denies   Any anosmia?  Patient denies   Any incontinence of urine?  Endorsed, wears pull ups  Any bowel dysfunction?  This week she had an episode of diarrhea (IBS)  and was controlled with Imodium Patient lives  [facility]  Does the patient drive?No longer drives    History 03/29/2022  The patient is accompanied by her daughter  who supplements the history.   How long did patient have memory difficulties?  Daughter reports that she began showing signs of memory loss about 2 years ago, but has been worse over the last 3 or 4 months, when she has to write down appointments, names, and she has difficulty remembering recent conversations.   Patient lives with:Patient lives alone repeats oneself? Endorsed, for the last 2 years.  Disoriented when walking into a room?  Patient denies   Leaving objects in unusual places?  Patient denies   Ambulates  with difficulty?   She walks about 1 mile a day, she  is not as active as she used to be before the death of her husband.   Recent falls?  Patient denies   Any head injuries?  Patient denies   History of seizures?   Patient denies   Wandering behavior?  Patient denies   Patient drives?  Short distances  Any mood changes ?  Patient is dealing with grief/depression after her husband's death to COVID few years ago Hallucinations?  Patient denies   Paranoia?  Patient denies   Patient reports that sleeps well without vivid dreams, REM behavior or sleepwalking    History of sleep apnea?  Patient denies   Any hygiene concerns?  Patient denies   Independent of bathing and dressing?  Endorsed  Does the patient needs help with medications?  She is in charge of the medications Who is in charge of the finances?  Patient is in charge   Any changes in appetite?  Patient denies   Patient have trouble swallowing? Patient denies   Does the patient cook?  Patient denies   Any kitchen accidents such as leaving the stove on? Patient denies   Any headaches?  Patient denies   Double vision? Patient denies   Any focal numbness or tingling?  Patient denies   Chronic back pain Patient denies   Unilateral weakness?  Patient denies   Any tremors?  Patient denies   Any history of anosmia?  Patient denies   Any incontinence of urine?  Endorsed, wears depends  Any bowel dysfunction?   Hemorrhoids, sometimes lose stools  History of heavy alcohol intake?  Patient denies   History of heavy tobacco use?  Patient denies   Family history of dementia?  Patient denies.    Recent labs 03/13/2022 remarkable for hemoglobin 11, hematocrit 32.4, HDL 33.2, sodium 132, 4, TSH 3.09.1.     MRI brain 04/2022  personally reviewed : No evidence of recent infarction, hemorrhage, or mass.Mild to moderate chronic microvascular ischemic changes. Parenchymal volume loss without lobar predilection or disproportionate hippocampal atrophy.   Neuropsychological evaluation 12/19/2022 Briefly,  results suggested severe impairment surrounding essentially all aspects of learning and memory. An additional isolated impairment was exhibited across a line orientation task. However, other visuospatial tasks were appropriate. The etiology for her mild dementia presentation is somewhat unclear. However, I do have concerns for an underlying neurodegenerative illness, namely Alzheimer's disease. Across memory testing, Alyssa Fernandez did not benefit from repeated exposure to novel information and was fully amnestic (i.e., 0% retention) across all memory testing after a brief delay. Across recognition testing, she became frustrated due to poor performance and ultimately refused to complete one task. The other two similar tasks exhibited variable performances; however, she made comments surrounding guessing across her stronger performance, suggesting that this may be an inflation of true abilities. Overall, memory performances suggest rapid forgetting and an evolving and already quite severe storage impairment,  both of which are the hallmark memory characteristics of this illness. Intact performances across the majority of non-memory cognitive abilities is certainly encouraging and would suggest that this disease process remains in early stages should it truly be present.   PREVIOUS MEDICATIONS:   CURRENT MEDICATIONS:  Outpatient Encounter Medications as of 12/28/2022  Medication Sig   amLODipine (NORVASC) 10 MG tablet Take 10 mg by mouth in the morning. (0800) HOLD FOR SBP LESS THAN 110   ARIPiprazole (ABILIFY) 5 MG tablet Take 1 tablet (5 mg total) by mouth daily.   ascorbic acid (VITAMIN C) 500 MG tablet Take 500 mg by mouth in the morning. (0800)   donepezil (ARICEPT) 10 MG tablet Take 1 tablet (10 mg total) by mouth at bedtime. (Patient taking differently: Take 10 mg by mouth at bedtime. (2000))   hydrochlorothiazide (HYDRODIURIL) 25 MG tablet Take 25 mg by mouth in the morning. (0800)   labetalol (NORMODYNE)  300 MG tablet Take 1 tablet (300 mg total) by mouth 2 (two) times daily.   psyllium (HYDROCIL/METAMUCIL) 95 % PACK Take 1 packet by mouth daily as needed for mild constipation.   rosuvastatin (CRESTOR) 20 MG tablet Take 20 mg by mouth daily at 8 pm. (2000)   sertraline (ZOLOFT) 100 MG tablet 1 tab by mouth once daily   telmisartan (MICARDIS) 80 MG tablet Take 1 tablet (80 mg total) by mouth daily.   traMADol (ULTRAM) 50 MG tablet Take 1-2 tablets (50-100 mg total) by mouth every 6 (six) hours as needed for moderate pain or severe pain.   vitamin B-12 (CYANOCOBALAMIN) 1000 MCG tablet Take 1 tablet (1,000 mcg total) by mouth daily.   ALPRAZolam (XANAX) 0.5 MG tablet 1 tab by mouth twice per day as needed (Patient not taking: Reported on 12/28/2022)   ferrous sulfate 325 (65 FE) MG tablet Take 1 tablet (325 mg total) by mouth daily. (Patient taking differently: Take 325 mg by mouth daily. (0800))   No facility-administered encounter medications on file as of 12/28/2022.       03/29/2018    4:28 PM  MMSE - Mini Mental State Exam  Orientation to time 5  Orientation to Place 5  Registration 3  Attention/ Calculation 4  Recall 1  Language- name 2 objects 2  Language- repeat 1  Language- follow 3 step command 3  Language- read & follow direction 1  Write a sentence 1  Copy design 1  Total score 27      03/30/2022   12:00 PM  Montreal Cognitive Assessment   Visuospatial/ Executive (0/5) 4  Naming (0/3) 3  Attention: Read list of digits (0/2) 1  Attention: Read list of letters (0/1) 1  Attention: Serial 7 subtraction starting at 100 (0/3) 0  Language: Repeat phrase (0/2) 0  Language : Fluency (0/1) 0  Abstraction (0/2) 2  Delayed Recall (0/5) 1  Orientation (0/6) 4  Total 16  Adjusted Score (based on education) 16    Objective:     PHYSICAL EXAMINATION:    VITALS:   Vitals:   12/28/22 0917  BP: (!) 86/54  Pulse: (!) 58  Resp: 20  SpO2: 98%  Weight: 118 lb (53.5 kg)   Height: 5' 3.5" (1.613 m)    GEN:  The patient appears stated age and is in NAD. HEENT:  Normocephalic, atraumatic.   Neurological examination:  General: NAD, well-groomed, appears stated age. Orientation: The patient is alert. Oriented to person, place and not to date Cranial nerves: There  is good facial symmetry.The speech is fluent and clear. No aphasia or dysarthria. Fund of knowledge is appropriate. Recent and remote memory are impaired. Attention and concentration are reduced.  Able to name objects and repeat phrases.  Hearing is intact to conversational tone.  Sensation: Sensation is intact to light touch throughout Motor: Strength is at least antigravity x4. DTR's 2/4 in UE/LE     Movement examination: Tone: There is normal tone in the UE/LE Abnormal movements:  no tremor.  No myoclonus.  No asterixis.   Coordination:  There is no decremation with RAM's. Normal finger to nose  Gait and Station: The patient has no difficulty arising out of a deep-seated chair without the use of the hands. The patient's stride length is good.  Gait is cautious and narrow.    Thank you for allowing [place] the opportunity to participate in the care of this nice patient. Please do not hesitate to contact us for any questions or concerns.   Total time spent on today's visit was 26 minutes dedicated to this patient today, preparing to see patient, examining the patient, ordering tests and/or medications and counseling the patient, documenting clinical information in the EHR or other health record, independently interpreting results and communicating results to the patient/family, discussing treatment and goals, answering patient's questions and coordinating care.  Cc:  Pcp, No  Marlowe Kays 12/28/2022 9:44 AM

## 2022-12-28 NOTE — Patient Instructions (Signed)
It was a pleasure to see you today at our office.   Recommendations:   Continue donepezil 10 mg daily  Follow up 6 month Monitor the Blood pressure    Whom to call:  Memory  decline, memory medications: Call our office [phone]   For psychiatric meds, mood meds: Please have your primary care physician manage these medications.     For assessment of decision of mental capacity and competency:  Call Dr. Erick Blinks, geriatric psychiatrist at [phone]  For guidance in geriatric dementia issues please call [facility] [phone]     If you have any severe symptoms of a stroke, or other severe issues such as confusion,severe chills or fever, etc call 911 or go to the ER as you may need to be evaluated further      RECOMMENDATIONS FOR ALL PATIENTS WITH MEMORY PROBLEMS: 1. Continue to exercise (Recommend 30 minutes of walking everyday, or 3 hours every week) 2. Increase social interactions - continue going to [place] and enjoy social gatherings with friends and family 3. Eat healthy, avoid fried foods and eat more fruits and vegetables 4. Maintain adequate blood pressure, blood sugar, and blood cholesterol level. Reducing the risk of stroke and cardiovascular disease also helps promoting better memory. 5. Avoid stressful situations. Live a simple life and avoid aggravations. Organize your time and prepare for the next day in anticipation. 6. Sleep well, avoid any interruptions of sleep and avoid any distractions in the bedroom that may interfere with adequate sleep quality 7. Avoid sugar, avoid sweets as there is a strong link between excessive sugar intake, diabetes, and cognitive impairment We discussed the Mediterranean diet, which has been shown to help patients reduce the risk of progressive memory disorders and reduces cardiovascular risk. This includes eating fish, eat fruits and green leafy vegetables, nuts like almonds and hazelnuts, walnuts, and also use  olive oil. Avoid fast foods and fried foods as much as possible. Avoid sweets and sugar as sugar use has been linked to worsening of memory function.  There is always a concern of gradual progression of memory problems. If this is the case, then we may need to adjust level of care according to patient needs. Support, both to the patient and caregiver, should then be put into place.      FALL PRECAUTIONS: Be cautious when walking. Scan the area for obstacles that may increase the risk of trips and falls. When getting up in the mornings, sit up at the edge of the bed for a few minutes before getting out of bed. Consider elevating the bed at the head end to avoid drop of blood pressure when getting up. Walk always in a well-lit room (use night lights in the walls). Avoid area rugs or power cords from appliances in the middle of the walkways. Use a walker or a cane if necessary and consider physical therapy for balance exercise. Get your eyesight checked regularly.  FINANCIAL OVERSIGHT: Supervision, especially oversight when making financial decisions or transactions is also recommended.  HOME SAFETY: Consider the safety of the kitchen when operating appliances like stoves, microwave oven, and blender. Consider having supervision and share cooking responsibilities until no longer able to participate in those. Accidents with firearms and other hazards in the house should be identified and addressed as well.   ABILITY TO BE LEFT ALONE: If patient is unable to contact 911 operator, consider using LifeLine, or when the need is there, arrange for someone to stay with patients. Smoking  is a Air cabin crew hazard, consider supervision or cessation. Risk of wandering should be assessed by caregiver and if detected at any point, supervision and safe proof recommendations should be instituted.  MEDICATION SUPERVISION: Inability to self-administer medication needs to be constantly addressed. Implement a mechanism to ensure  safe administration of the medications.   DRIVING: Regarding driving, in patients with progressive memory problems, driving will be impaired. We advise to have someone else do the driving if trouble finding directions or if minor accidents are reported. Independent driving assessment is available to determine safety of driving.   If you are interested in the driving assessment, you can contact the following:  [employer] in [place] [phone]  [facility] [phone]  [facility] [phone] or [phone]    Mediterranean Diet A Mediterranean diet refers to food and lifestyle choices that are based on the traditions of countries located on the [employer]. This way of eating has been shown to help prevent certain conditions and improve outcomes for people who have chronic diseases, like kidney disease and heart disease. What are tips for following this plan? Lifestyle  Cook and eat meals together with your family, when possible. Drink enough fluid to keep your urine clear or pale yellow. Be physically active every day. This includes: Aerobic exercise like running or swimming. Leisure activities like gardening, walking, or housework. Get 7-8 hours of sleep each night. If recommended by your health care provider, drink red wine in moderation. This means 1 glass a day for nonpregnant women and 2 glasses a day for men. A glass of wine equals 5 oz (150 mL). Reading food labels  Check the serving size of packaged foods. For foods such as rice and pasta, the serving size refers to the amount of cooked product, not dry. Check the total fat in packaged foods. Avoid foods that have saturated fat or trans fats. Check the ingredients list for added sugars, such as corn syrup. Shopping  At the grocery store, buy most of your food from the areas near the walls of the store. This includes: Fresh fruits and  vegetables (produce). Grains, beans, nuts, and seeds. Some of these may be available in unpackaged forms or large amounts (in bulk). Fresh seafood. Poultry and eggs. Low-fat dairy products. Buy whole ingredients instead of prepackaged foods. Buy fresh fruits and vegetables in-season from local farmers markets. Buy frozen fruits and vegetables in resealable bags. If you do not have access to quality fresh seafood, buy precooked frozen shrimp or canned fish, such as tuna, salmon, or sardines. Buy small amounts of raw or cooked vegetables, salads, or olives from the deli or salad bar at your store. Stock your pantry so you always have certain foods on hand, such as olive oil, canned tuna, canned tomatoes, rice, pasta, and beans. Cooking  Cook foods with extra-virgin olive oil instead of using butter or other vegetable oils. Have meat as a side dish, and have vegetables or grains as your main dish. This means having meat in small portions or adding small amounts of meat to foods like pasta or stew. Use beans or vegetables instead of meat in common dishes like chili or lasagna. Experiment with different cooking methods. Try roasting or broiling vegetables instead of steaming or sauteing them. Add frozen vegetables to soups, stews, pasta, or rice. Add nuts or seeds for added healthy fat at each meal. You can add these to yogurt, salads, or vegetable dishes. Marinate fish or  vegetables using olive oil, lemon juice, garlic, and fresh herbs. Meal planning  Plan to eat 1 vegetarian meal one day each week. Try to work up to 2 vegetarian meals, if possible. Eat seafood 2 or more times a week. Have healthy snacks readily available, such as: Vegetable sticks with hummus. Greek yogurt. Fruit and nut trail mix. Eat balanced meals throughout the week. This includes: Fruit: 2-3 servings a day Vegetables: 4-5 servings a day Low-fat dairy: 2 servings a day Fish, poultry, or lean meat: 1 serving a  day Beans and legumes: 2 or more servings a week Nuts and seeds: 1-2 servings a day Whole grains: 6-8 servings a day Extra-virgin olive oil: 3-4 servings a day Limit red meat and sweets to only a few servings a month What are my food choices? Mediterranean diet Recommended Grains: Whole-grain pasta. Brackins rice. Bulgar wheat. Polenta. Couscous. Whole-wheat bread. Orpah Cobb. Vegetables: Artichokes. Beets. Broccoli. Cabbage. Carrots. Eggplant. Green beans. Chard. Kale. Spinach. Onions. Leeks. Peas. Squash. Tomatoes. Peppers. Radishes. Fruits: Apples. Apricots. Avocado. Berries. Bananas. Cherries. Dates. Figs. Grapes. Lemons. Melon. Oranges. Peaches. Plums. Pomegranate. Meats and other protein foods: Beans. Almonds. Sunflower seeds. Pine nuts. Peanuts. Cod. Salmon. Scallops. Shrimp. Tuna. Tilapia. Clams. Oysters. Eggs. Dairy: Low-fat milk. Cheese. Greek yogurt. Beverages: Water. Red wine. Herbal tea. Fats and oils: Extra virgin olive oil. Avocado oil. Grape seed oil. Sweets and desserts: [place] yogurt with honey. Baked apples. Poached pears. Trail mix. Seasoning and other foods: Basil. Cilantro. Coriander. Cumin. Mint. Parsley. Sage. Rosemary. Tarragon. Garlic. Oregano. Thyme. Pepper. Balsalmic vinegar. Tahini. Hummus. Tomato sauce. Olives. Mushrooms. Limit these Grains: Prepackaged pasta or rice dishes. Prepackaged cereal with added sugar. Vegetables: Deep fried potatoes (french fries). Fruits: Fruit canned in syrup. Meats and other protein foods: Beef. Pork. Lamb. Poultry with skin. Hot dogs. Tomasa Blase. Dairy: Ice cream. Sour cream. Whole milk. Beverages: Juice. Sugar-sweetened soft drinks. Beer. Liquor and spirits. Fats and oils: Butter. Canola oil. Vegetable oil. Beef fat (tallow). Lard. Sweets and desserts: Cookies. Cakes. Pies. Candy. Seasoning and other foods: Mayonnaise. Premade sauces and marinades. The items listed may not be a complete list. Talk with your dietitian about what  dietary choices are right for you. Summary The Mediterranean diet includes both food and lifestyle choices. Eat a variety of fresh fruits and vegetables, beans, nuts, seeds, and whole grains. Limit the amount of red meat and sweets that you eat. Talk with your health care provider about whether it is safe for you to drink red wine in moderation. This means 1 glass a day for nonpregnant women and 2 glasses a day for men. A glass of wine equals 5 oz (150 mL). This information is not intended to replace advice given to you by your health care provider. Make sure you discuss any questions you have with your health care provider. Document Released: 03/30/2016 Document Revised: 05/02/2016 Document Reviewed: 03/30/2016  Elsevier Interactive Patient Education  2017 [employer].  Your provider has requested that you have labwork completed today. Please go to [facility] ([address]) on the second floor of this building before leaving the office today. You do not need to check in. If you are not called within 15 minutes please check with the front desk.  We have sent a referral to [facility] for your MRI and they will call you directly to schedule your appointment. They are located at [address] [facility]. If you need to contact them directly please call [phone].

## 2023-01-17 DIAGNOSIS — K623 Rectal prolapse: Secondary | ICD-10-CM | POA: Diagnosis not present

## 2023-01-17 DIAGNOSIS — G309 Alzheimer's disease, unspecified: Secondary | ICD-10-CM | POA: Diagnosis not present

## 2023-01-17 DIAGNOSIS — F02A0 Dementia in other diseases classified elsewhere, mild, without behavioral disturbance, psychotic disturbance, mood disturbance, and anxiety: Secondary | ICD-10-CM | POA: Diagnosis not present

## 2023-01-17 DIAGNOSIS — R197 Diarrhea, unspecified: Secondary | ICD-10-CM | POA: Diagnosis not present

## 2023-02-13 DIAGNOSIS — M25512 Pain in left shoulder: Secondary | ICD-10-CM | POA: Diagnosis not present

## 2023-02-13 DIAGNOSIS — M25811 Other specified joint disorders, right shoulder: Secondary | ICD-10-CM | POA: Diagnosis not present

## 2023-02-20 DIAGNOSIS — E785 Hyperlipidemia, unspecified: Secondary | ICD-10-CM | POA: Diagnosis not present

## 2023-02-20 DIAGNOSIS — R0989 Other specified symptoms and signs involving the circulatory and respiratory systems: Secondary | ICD-10-CM | POA: Diagnosis not present

## 2023-02-20 DIAGNOSIS — R197 Diarrhea, unspecified: Secondary | ICD-10-CM | POA: Diagnosis not present

## 2023-02-20 DIAGNOSIS — G309 Alzheimer's disease, unspecified: Secondary | ICD-10-CM | POA: Diagnosis not present

## 2023-03-06 DIAGNOSIS — D649 Anemia, unspecified: Secondary | ICD-10-CM | POA: Diagnosis not present

## 2023-03-06 DIAGNOSIS — E785 Hyperlipidemia, unspecified: Secondary | ICD-10-CM | POA: Diagnosis not present

## 2023-03-13 DIAGNOSIS — E876 Hypokalemia: Secondary | ICD-10-CM | POA: Diagnosis not present

## 2023-03-13 DIAGNOSIS — I129 Hypertensive chronic kidney disease with stage 1 through stage 4 chronic kidney disease, or unspecified chronic kidney disease: Secondary | ICD-10-CM | POA: Diagnosis not present

## 2023-03-13 DIAGNOSIS — D631 Anemia in chronic kidney disease: Secondary | ICD-10-CM | POA: Diagnosis not present

## 2023-03-13 DIAGNOSIS — N183 Chronic kidney disease, stage 3 unspecified: Secondary | ICD-10-CM | POA: Diagnosis not present

## 2023-03-15 DIAGNOSIS — I10 Essential (primary) hypertension: Secondary | ICD-10-CM | POA: Diagnosis not present

## 2023-03-20 DIAGNOSIS — G4700 Insomnia, unspecified: Secondary | ICD-10-CM | POA: Diagnosis not present

## 2023-03-20 DIAGNOSIS — R197 Diarrhea, unspecified: Secondary | ICD-10-CM | POA: Diagnosis not present

## 2023-03-20 DIAGNOSIS — I10 Essential (primary) hypertension: Secondary | ICD-10-CM | POA: Diagnosis not present

## 2023-03-20 DIAGNOSIS — E876 Hypokalemia: Secondary | ICD-10-CM | POA: Diagnosis not present

## 2023-03-27 DIAGNOSIS — K623 Rectal prolapse: Secondary | ICD-10-CM | POA: Diagnosis not present

## 2023-03-27 DIAGNOSIS — G4700 Insomnia, unspecified: Secondary | ICD-10-CM | POA: Diagnosis not present

## 2023-03-27 DIAGNOSIS — R197 Diarrhea, unspecified: Secondary | ICD-10-CM | POA: Diagnosis not present

## 2023-03-27 DIAGNOSIS — E876 Hypokalemia: Secondary | ICD-10-CM | POA: Diagnosis not present

## 2023-04-03 DIAGNOSIS — Z79899 Other long term (current) drug therapy: Secondary | ICD-10-CM | POA: Diagnosis not present

## 2023-04-10 DIAGNOSIS — N183 Chronic kidney disease, stage 3 unspecified: Secondary | ICD-10-CM | POA: Diagnosis not present

## 2023-04-10 DIAGNOSIS — R195 Other fecal abnormalities: Secondary | ICD-10-CM | POA: Diagnosis not present

## 2023-04-10 DIAGNOSIS — E876 Hypokalemia: Secondary | ICD-10-CM | POA: Diagnosis not present

## 2023-04-10 DIAGNOSIS — Z9889 Other specified postprocedural states: Secondary | ICD-10-CM | POA: Diagnosis not present

## 2023-04-16 ENCOUNTER — Telehealth: Payer: Self-pay | Admitting: Gastroenterology

## 2023-04-16 DIAGNOSIS — R197 Diarrhea, unspecified: Secondary | ICD-10-CM

## 2023-04-16 NOTE — Telephone Encounter (Signed)
Returned call to patient's daughter Alyssa Fernandez). Alyssa Fernandez states that pt has been having intermittent diarrhea for months, stools vary from liquid to soft stools. Alyssa Fernandez reports that patient's stools were previously only loose in the mornings, but now stools are loose in the evening as well. Pt is at [facility], she has not started any new medications. Alyssa Fernandez gave them permission to give patient antidiarrheal if she is having diarrhea, but pt will not ask for the medication. Alyssa Fernandez states that the patient exaggerates, but mentioned that she passes about 7 stools a day. Pt is taking 2 fiber pills daily and Metamucil once every other day. Alyssa Fernandez denies that patient has abdominal pain, nausea, or vomiting. Pt is still tolerating regular meals. Alyssa Fernandez wants to know if anything should be done prior to office visit (stool studies?). Alyssa Fernandez is aware that you are out of the office today, she knows that we will be in contact with her tomorrow regarding recommendations.

## 2023-04-16 NOTE — Telephone Encounter (Signed)
Patient called states the patient in the morning she has a lot of liquid diarrhea until lunch time and they are seeking advise.

## 2023-04-17 DIAGNOSIS — R197 Diarrhea, unspecified: Secondary | ICD-10-CM | POA: Diagnosis not present

## 2023-04-17 DIAGNOSIS — I10 Essential (primary) hypertension: Secondary | ICD-10-CM | POA: Diagnosis not present

## 2023-04-17 DIAGNOSIS — E876 Hypokalemia: Secondary | ICD-10-CM | POA: Diagnosis not present

## 2023-04-17 DIAGNOSIS — Z9889 Other specified postprocedural states: Secondary | ICD-10-CM | POA: Diagnosis not present

## 2023-04-17 NOTE — Telephone Encounter (Signed)
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  I recommend starting by ruling out C. difficile with a stool PCR test.  If that is negative, then Imodium can be used cautiously at a low dose (start with 2 tablets in the morning.  Clinic visit as scheduled.   -H Danis

## 2023-04-17 NOTE — Telephone Encounter (Signed)
Lm on vm for [facility] to return call to review recommendations.   C. Diff stool study order in epic.

## 2023-04-17 NOTE — Telephone Encounter (Signed)
Alyssa Fernandez returned call, we reviewed recommendations. Alyssa Fernandez will stop by the lab at her earliest convenience to pick up stool kit and collection instructions. If results are negative we can send update order to [place] at [facility] for Imodium. Alyssa Fernandez verbalized understanding and had no concerns at the end of the call.

## 2023-04-18 ENCOUNTER — Ambulatory Visit (INDEPENDENT_AMBULATORY_CARE_PROVIDER_SITE_OTHER): Payer: PPO

## 2023-04-18 ENCOUNTER — Encounter: Payer: Self-pay | Admitting: Internal Medicine

## 2023-04-18 ENCOUNTER — Ambulatory Visit: Payer: PPO | Admitting: Internal Medicine

## 2023-04-18 VITALS — BP 102/60 | HR 56 | Temp 98.5°F | Ht 63.5 in | Wt 125.8 lb

## 2023-04-18 DIAGNOSIS — R739 Hyperglycemia, unspecified: Secondary | ICD-10-CM | POA: Diagnosis not present

## 2023-04-18 DIAGNOSIS — Z0000 Encounter for general adult medical examination without abnormal findings: Secondary | ICD-10-CM | POA: Diagnosis not present

## 2023-04-18 DIAGNOSIS — N1831 Chronic kidney disease, stage 3a: Secondary | ICD-10-CM | POA: Diagnosis not present

## 2023-04-18 DIAGNOSIS — E039 Hypothyroidism, unspecified: Secondary | ICD-10-CM

## 2023-04-18 DIAGNOSIS — E559 Vitamin D deficiency, unspecified: Secondary | ICD-10-CM

## 2023-04-18 DIAGNOSIS — F3342 Major depressive disorder, recurrent, in full remission: Secondary | ICD-10-CM

## 2023-04-18 DIAGNOSIS — I10 Essential (primary) hypertension: Secondary | ICD-10-CM

## 2023-04-18 DIAGNOSIS — E538 Deficiency of other specified B group vitamins: Secondary | ICD-10-CM

## 2023-04-18 DIAGNOSIS — Z0001 Encounter for general adult medical examination with abnormal findings: Secondary | ICD-10-CM | POA: Insufficient documentation

## 2023-04-18 DIAGNOSIS — E7849 Other hyperlipidemia: Secondary | ICD-10-CM | POA: Diagnosis not present

## 2023-04-18 LAB — CBC WITH DIFFERENTIAL/PLATELET
Basophils Absolute: 0 10*3/uL (ref 0.0–0.1)
Basophils Relative: 0.7 % (ref 0.0–3.0)
Eosinophils Absolute: 0.3 10*3/uL (ref 0.0–0.7)
Eosinophils Relative: 5 % (ref 0.0–5.0)
HCT: 40.5 % (ref 36.0–46.0)
Hemoglobin: 13.4 g/dL (ref 12.0–15.0)
Lymphocytes Relative: 22.5 % (ref 12.0–46.0)
Lymphs Abs: 1.4 10*3/uL (ref 0.7–4.0)
MCHC: 33 g/dL (ref 30.0–36.0)
MCV: 91.4 fl (ref 78.0–100.0)
Monocytes Absolute: 0.5 10*3/uL (ref 0.1–1.0)
Monocytes Relative: 7.6 % (ref 3.0–12.0)
Neutro Abs: 4.1 10*3/uL (ref 1.4–7.7)
Neutrophils Relative %: 64.2 % (ref 43.0–77.0)
Platelets: 210 10*3/uL (ref 150.0–400.0)
RBC: 4.44 Mil/uL (ref 3.87–5.11)
RDW: 13.2 % (ref 11.5–15.5)
WBC: 6.4 10*3/uL (ref 4.0–10.5)

## 2023-04-18 LAB — BASIC METABOLIC PANEL
BUN: 29 mg/dL — ABNORMAL HIGH (ref 6–23)
CO2: 31 meq/L (ref 19–32)
Calcium: 9.6 mg/dL (ref 8.4–10.5)
Chloride: 99 meq/L (ref 96–112)
Creatinine, Ser: 1.27 mg/dL — ABNORMAL HIGH (ref 0.40–1.20)
GFR: 38.97 mL/min — ABNORMAL LOW (ref >60.00)
Glucose, Bld: 104 mg/dL — ABNORMAL HIGH (ref 70–99)
Potassium: 3.6 meq/L (ref 3.5–5.1)
Sodium: 138 meq/L (ref 135–145)

## 2023-04-18 LAB — HEPATIC FUNCTION PANEL
ALT: 10 U/L (ref 0–35)
AST: 16 U/L (ref 0–37)
Albumin: 4.3 g/dL (ref 3.5–5.2)
Alkaline Phosphatase: 57 U/L (ref 39–117)
Bilirubin, Direct: 0.1 mg/dL (ref 0.0–0.3)
Total Bilirubin: 0.7 mg/dL (ref 0.2–1.2)
Total Protein: 6.7 g/dL (ref 6.0–8.3)

## 2023-04-18 LAB — URINALYSIS, ROUTINE W REFLEX MICROSCOPIC
Bilirubin Urine: NEGATIVE
Hgb urine dipstick: NEGATIVE
Ketones, ur: NEGATIVE
Leukocytes,Ua: NEGATIVE
Nitrite: NEGATIVE
RBC / HPF: NONE SEEN (ref 0–?)
Specific Gravity, Urine: 1.02 (ref 1.000–1.030)
Total Protein, Urine: NEGATIVE
Urine Glucose: NEGATIVE
Urobilinogen, UA: 0.2 (ref 0.0–1.0)
pH: 6 (ref 5.0–8.0)

## 2023-04-18 LAB — LIPID PANEL
Cholesterol: 136 mg/dL (ref 0–200)
HDL: 51.9 mg/dL (ref >39.00)
LDL Cholesterol: 64 mg/dL (ref 0–99)
NonHDL: 83.85
Total CHOL/HDL Ratio: 3
Triglycerides: 100 mg/dL (ref 0.0–149.0)
VLDL: 20 mg/dL (ref 0.0–40.0)

## 2023-04-18 LAB — HEMOGLOBIN A1C: Hgb A1c MFr Bld: 5.6 % (ref 4.6–6.5)

## 2023-04-18 LAB — MICROALBUMIN / CREATININE URINE RATIO
Creatinine,U: 85.4 mg/dL
Microalb Creat Ratio: 1.1 mg/g (ref 0.0–30.0)
Microalb, Ur: 0.9 mg/dL (ref 0.0–1.9)

## 2023-04-18 NOTE — Patient Instructions (Signed)
Alyssa Fernandez , Thank you for taking time to come for your Medicare Wellness Visit. I appreciate your ongoing commitment to your health goals. Please review the following plan we discussed and let me know if I can assist you in the future.   Referrals/Orders/Follow-Ups/Clinician Recommendations: No  This is a list of the screening recommended for you and due dates:  Health Maintenance  Topic Date Due   Flu Shot  03/22/2023   Medicare Annual Wellness Visit  04/17/2024   DTaP/Tdap/Td vaccine (3 - Td or Tdap) 04/21/2027   Pneumonia Vaccine  Completed   DEXA scan (bone density measurement)  Completed   Zoster (Shingles) Vaccine  Completed   HPV Vaccine  Aged Out   COVID-19 Vaccine  Discontinued    Advanced directives: (In Chart) A copy of your advanced directives are scanned into your chart should your provider ever need it.  Next Medicare Annual Wellness Visit scheduled for next year: No

## 2023-04-18 NOTE — Progress Notes (Signed)
Subjective:   Alyssa Fernandez is a 82 y.o. female who presents for Medicare Annual (Subsequent) preventive examination.  Visit Complete: In person  Patient Medicare AWV questionnaire was completed by the patient on 04/18/2023; I have confirmed that all information answered by patient is correct and no changes since this date.  Review of Systems     Cardiac Risk Factors include: advanced age (>42men, >31 women);family history of premature cardiovascular disease;dyslipidemia;hypertension     Objective:    Today's Vitals   04/18/23 1025  BP: 102/60  Pulse: (!) 56  Temp: 98.5 F (36.9 C)  TempSrc: Temporal  SpO2: 90%  Weight: 125 lb 12.8 oz (57.1 kg)  Height: 5' 3.5" (1.613 m)  PainSc: 0-No pain   Body mass index is 21.93 kg/m.     04/18/2023   10:32 AM 12/28/2022    9:24 AM 10/26/2022    6:01 AM 10/24/2022    8:05 AM 07/26/2022    7:06 AM 07/17/2022    2:18 PM 05/11/2022   10:57 AM  Advanced Directives  Does Patient Have a Medical Advance Directive? Yes Yes Yes Yes Yes Yes Yes  Type of Estate agent of [place];Living will Healthcare Power of [employer] of [place];Living will Healthcare Power of [place];Living will Healthcare Power of [place];Living will    Does patient want to make changes to medical advance directive? No - Patient declined No - Patient declined No - Patient declined  No - Patient declined No - Patient declined   Copy of Healthcare Power of Attorney in Chart? Yes - validated most recent copy scanned in chart (See row information) No - copy requested Yes - validated most recent copy scanned in chart (See row information)  No - copy requested      Current Medications (verified) Outpatient Encounter Medications as of 04/18/2023  Medication Sig   ALPRAZolam (XANAX) 0.5 MG tablet 1 tab by mouth twice per day as needed (Patient not taking: Reported on 12/28/2022)   amLODipine (NORVASC) 10 MG tablet Take 10 mg by mouth in the  morning. (0800) HOLD FOR SBP LESS THAN 110   ARIPiprazole (ABILIFY) 5 MG tablet Take 1 tablet (5 mg total) by mouth daily.   ascorbic acid (VITAMIN C) 500 MG tablet Take 500 mg by mouth in the morning. (0800)   donepezil (ARICEPT) 10 MG tablet Take 1 tablet (10 mg total) by mouth at bedtime. (Patient taking differently: Take 10 mg by mouth at bedtime. (2000))   ferrous sulfate 325 (65 FE) MG tablet Take 1 tablet (325 mg total) by mouth daily. (Patient taking differently: Take 325 mg by mouth daily. (0800))   hydrochlorothiazide (HYDRODIURIL) 25 MG tablet Take 25 mg by mouth in the morning. (0800)   labetalol (NORMODYNE) 300 MG tablet Take 1 tablet (300 mg total) by mouth 2 (two) times daily.   psyllium (HYDROCIL/METAMUCIL) 95 % PACK Take 1 packet by mouth daily as needed for mild constipation.   rosuvastatin (CRESTOR) 20 MG tablet Take 20 mg by mouth daily at 8 pm. (2000)   sertraline (ZOLOFT) 100 MG tablet 1 tab by mouth once daily   telmisartan (MICARDIS) 80 MG tablet Take 1 tablet (80 mg total) by mouth daily.   traMADol (ULTRAM) 50 MG tablet Take 1-2 tablets (50-100 mg total) by mouth every 6 (six) hours as needed for moderate pain or severe pain.   vitamin B-12 (CYANOCOBALAMIN) 1000 MCG tablet Take 1 tablet (1,000 mcg total) by mouth daily.  No facility-administered encounter medications on file as of 04/18/2023.    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   Severe by CT April 2012   Degenerative joint  disease 05/26/2014   Dehydration 05/06/2012   Dermatitis 01/20/2010   Dyspnea 02/03/2014   Easy bruising 12/02/2015   Epigastric pain 12/14/2021   Essential hypertension 02/24/2010   Fatigue 11/25/2014   Generalized anxiety disorder 11/16/2011   GERD    Hepatic abscess 05/03/2012   History of kidney stones    History of skin cancer    leg   Hives 04/08/2014   Hyperlipidemia    Hypokalemia 05/06/2012   Hypothyroidism 02/12/2008   Insomnia    Internal hemorrhoids 03/22/2022   Irritable bowel syndrome with diarrhea 10/16/2022   Liver abscess 06/19/2012   Low blood pressure 06/19/2012   Lumbar radiculopathy 05/27/2021   Major depressive disorder 12/14/2021   Menopausal and postmenopausal disorder 03/09/2009   Mild dementia, concerns for Alzheimer's disease 12/12/2022   Normocytic anemia 05/07/2012   Osteopenia    Osteoporosis 01/30/2018   Penetrating forearm wound, left 05/16/2020   Peripheral neuropathy 02/05/2013   Polycystic kidney 02/12/2008   Pruritus ani 08/17/2022   Rash 11/20/2011   Rectal prolapse s/p robotic LAR/rectopexy 07/26/2022   Right wrist pain 11/09/2019   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   Tachycardia, unspecified 10/05/2009   UTI (urinary tract infection) 03/22/2022   Vaginal itching 06/26/2012   Vitamin D deficiency 11/10/2009   Past Surgical History:  Procedure Laterality Date   ABDOMINAL HYSTERECTOMY     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     bladder operation, but per sling procedure     BUNIONECTOMY     CERVICAL DISC SURGERY     fx rt ankle     INGUINAL HERNIA REPAIR Left 10/26/2022   Procedure: LAPAROSCOPIC BILATERAL SPIGELIAN HERNIA, BILATERAL INGUINAL HERNIA  AND LEFT FEMORAL HERNIA REPAIR WITH MESH AND TAP BLOCK;  Surgeon: Karie Soda, MD;  Location: [facility];  Service: General;  Laterality: Left;  120 ROOM 5   left temporal bx artery-negative 2      PARTIAL NEPHRECTOMY Left    percutaneous drainage of liver abscess  05/06/2012   PROCTOSCOPY N/A  07/26/2022   Procedure: RIGID PROCTOSCOPY;  Surgeon: Karie Soda, MD;  Location: [facility];  Service: General;  Laterality: N/A;   RECTOPEXY N/A 07/26/2022   Procedure: RECTOPEXY;  Surgeon: Karie Soda, MD;  Location: [facility];  Service: General;  Laterality: N/A;   Family History  Problem Relation Age of Onset   Stroke Mother    Stomach cancer Sister    Bipolar disorder Brother    Memory loss Other        grandparent   Colon cancer Neg Hx    Colon polyps Neg Hx    Esophageal cancer Neg Hx    Rectal cancer Neg Hx    Social History   Socioeconomic History   Marital status: Widowed    Spouse name: Not on file   Number of children: 1   Years of  education: 14   Highest education level: Some college, no degree  Occupational History   Occupation: Retired  Tobacco Use   Smoking status: Never   Smokeless tobacco: Never  Vaping Use   Vaping status: Never Used  Substance and Sexual Activity   Alcohol use: No   Drug use: No   Sexual activity: Not Currently    Birth control/protection: Post-menopausal  Other Topics Concern   Not on file  Social History Narrative   Widowed since December 2021; married for over 40 years.   Lives in a 2-story home with cat.   Right handed   No caffeine   Social Determinants of Health   Financial Resource Strain: Patient Declined (04/18/2023)   Overall Financial Resource Strain (CARDIA)    Difficulty of Paying Living Expenses: Patient declined  Food Insecurity: Patient Declined (04/18/2023)   Hunger Vital Sign    Worried About Running Out of Food in the Last Year: Patient declined    Ran Out of Food in the Last Year: Patient declined  Transportation Needs: No Transportation Needs (04/18/2023)   PRAPARE - Administrator, Civil Service (Medical): No    Lack of Transportation (Non-Medical): No  Physical Activity: Insufficiently Active (04/18/2023)   Exercise Vital Sign    Days of Exercise per Week: 4 days    Minutes of Exercise per  Session: 20 min  Stress: Stress Concern Present (04/18/2023)   [employer] of Occupational Health - Occupational Stress Questionnaire    Feeling of Stress : To some extent  Social Connections: Unknown (04/18/2023)   Social Connection and Isolation Panel [NHANES]    Frequency of Communication with Friends and Family: More than three times a week    Frequency of Social Gatherings with Friends and Family: More than three times a week    Attends Religious Services: Not on file    Active Member of Clubs or Organizations: No    Attends Engineer, structural: More than 4 times per year    Marital Status: Widowed    Tobacco Counseling Counseling given: Not Answered   Clinical Intake:  Pre-visit preparation completed: Yes  Pain : No/denies pain Pain Score: 0-No pain     Nutritional Risks: None Diabetes: No  How often do you need to have someone help you when you read instructions, pamphlets, or other written materials from your doctor or pharmacy?: 5 - Always  Interpreter Needed?: No  Information entered by :: Maysa Lynn N. Amelio Brosky, LPN.   Activities of Daily Living    04/18/2023   10:32 AM 04/18/2023    9:10 AM  In your present state of health, do you have any difficulty performing the following activities:  Hearing? 0   Vision? 0 0  Difficulty concentrating or making decisions? 1 1  Walking or climbing stairs? 0 0  Dressing or bathing? 0 0  Doing errands, shopping? 1 1  Preparing Food and eating ? N   Using the Toilet? N   In the past six months, have you accidently leaked urine? Y Y  Do you have problems with loss of bowel control? Y Y  Managing your Medications? Y Y  Managing your Finances? Malvin Johns  Housekeeping or managing your Housekeeping? Malvin Johns    Patient Care Team: Corwin Levins, MD as PCP - General (Internal Medicine) Karie Soda, MD as Consulting Physician (General Surgery) Shellia Cleverly, DO as Consulting Physician (Gastroenterology) [place],  Heather, OD as Consulting Physician (Optometry)  Indicate any  recent Medical Services you may have received from other than [facility] providers in the past year (date may be approximate).     Assessment:   This is a routine wellness examination for Alyssa Fernandez.  Hearing/Vision screen Hearing Screening - Comments:: Denies hearing difficulties.   Vision Screening - Comments:: Patient does not wear any glasses/contacts. Cataracts removed and had lens implants. Up to date with  routine eye exams with Heather [place]i, OD.    Dietary issues and exercise activities discussed:     Goals Addressed             This Visit's Progress    Client understands the importance of follow-up with providers by attending scheduled visits        Depression Screen    04/18/2023   10:56 AM 10/26/2021    9:20 AM 10/25/2020    9:28 AM 05/26/2020    1:29 PM 05/08/2019    2:35 PM 03/29/2018    4:27 PM 01/30/2018    1:49 PM  PHQ 2/9 Scores  PHQ - 2 Score 0 0 2 0 1 1 1   PHQ- 9 Score 4     4     Fall Risk    04/18/2023   10:32 AM 04/18/2023    9:10 AM 12/28/2022    9:24 AM 05/11/2022   10:57 AM 03/29/2022    8:08 AM  Fall Risk   Falls in the past year? 0 0 0 0 0  Number falls in past yr: 0  0 0 0  Injury with Fall? 0  0 0 0  Risk for fall due to : No Fall Risks      Follow up Falls prevention discussed  Falls evaluation completed      MEDICARE RISK AT HOME: Medicare Risk at Home Any stairs in or around the home?: No If so, are there any without handrails?: No Home free of loose throw rugs in walkways, pet beds, electrical cords, etc?: Yes Adequate lighting in your home to reduce risk of falls?: Yes Life alert?: No Use of a cane, walker or w/c?: No Grab bars in the bathroom?: Yes Shower chair or bench in shower?: Yes Elevated toilet seat or a handicapped toilet?: Yes  TIMED UP AND GO:  Was the test performed?  Yes  Length of time to ambulate 10 feet: 8 sec Gait steady and fast without use of assistive  device    Cognitive Function:    03/29/2018    4:28 PM  MMSE - Mini Mental State Exam  Orientation to time 5  Orientation to Place 5  Registration 3  Attention/ Calculation 4  Recall 1  Language- name 2 objects 2  Language- repeat 1  Language- follow 3 step command 3  Language- read & follow direction 1  Write a sentence 1  Copy design 1  Total score 27      03/30/2022   12:00 PM  Montreal Cognitive Assessment   Visuospatial/ Executive (0/5) 4  Naming (0/3) 3  Attention: Read list of digits (0/2) 1  Attention: Read list of letters (0/1) 1  Attention: Serial 7 subtraction starting at 100 (0/3) 0  Language: Repeat phrase (0/2) 0  Language : Fluency (0/1) 0  Abstraction (0/2) 2  Delayed Recall (0/5) 1  Orientation (0/6) 4  Total 16  Adjusted Score (based on education) 16      Immunizations Immunization History  Administered Date(s) Administered   Fluad Quad(high Dose 65+) 04/19/2019, 09/27/2020, 05/13/2021, 06/13/2022  Influenza Split 06/02/2011, 05/21/2012   Influenza Whole 06/12/2007   Influenza, High Dose Seasonal PF 04/20/2017   Influenza,inj,Quad PF,6+ Mos 05/22/2013, 04/08/2014, 06/03/2015, 05/04/2016   Influenza,inj,quad, With Preservative 05/21/2017   PFIZER Comirnaty(Gray Top)Covid-19 Tri-Sucrose Vaccine 01/03/2020, 01/24/2020, 09/16/2020   Pneumococcal Conjugate-13 05/22/2013   Pneumococcal Polysaccharide-23 06/12/2007   Td 10/16/2006   Tdap 04/20/2017   Zoster Recombinant(Shingrix) 02/15/2018, 04/10/2018   Zoster, Live 10/16/2006    TDAP status: Up to date  Flu Vaccine status: Up to date  Pneumococcal vaccine status: Up to date  Covid-19 vaccine status: Completed vaccines  Qualifies for Shingles Vaccine? Yes   Zostavax completed Yes   Shingrix Completed?: Yes  Screening Tests Health Maintenance  Topic Date Due   INFLUENZA VACCINE  03/22/2023   Medicare Annual Wellness (AWV)  04/17/2024   DTaP/Tdap/Td (3 - Td or Tdap) 04/21/2027    Pneumonia Vaccine 31+ Years old  Completed   DEXA SCAN  Completed   Zoster Vaccines- Shingrix  Completed   HPV VACCINES  Aged Out   COVID-19 Vaccine  Discontinued    Health Maintenance  Health Maintenance Due  Topic Date Due   INFLUENZA VACCINE  03/22/2023    Colorectal cancer screening: No longer required.   Mammogram status: No longer required due to age.  Bone Density status: Completed 06/10/2019. Results reflect: Bone density results: OSTEOPENIA. Repeat every 2-3 years.  Lung Cancer Screening: (Low Dose CT Chest recommended if Age 40-80 years, 20 pack-year currently smoking OR have quit w/in 15years.) does not qualify.   Lung Cancer Screening Referral: no  Additional Screening:  Hepatitis C Screening: does not qualify; Completed: no  Vision Screening: Recommended annual ophthalmology exams for early detection of glaucoma and other disorders of the eye. Is the patient up to date with their annual eye exam?  Yes  Who is the provider or what is the name of the office in which the patient attends annual eye exams? Heather [place], OD. If pt is not established with a provider, would they like to be referred to a provider to establish care? No .   Dental Screening: Recommended annual dental exams for proper oral hygiene  Diabetic Foot Exam: N/A  Community Resource Referral / Chronic Care Management: CRR required this visit?  No   CCM required this visit?  No     Plan:     I have personally reviewed and noted the following in the patient's chart:   Medical and social history Use of alcohol, tobacco or illicit drugs  Current medications and supplements including opioid prescriptions. Patient is not currently taking opioid prescriptions. Functional ability and status Nutritional status Physical activity Advanced directives List of other physicians Hospitalizations, surgeries, and ER visits in previous 12 months Vitals Screenings to include cognitive, depression, and  falls Referrals and appointments  In addition, I have reviewed and discussed with patient certain preventive protocols, quality metrics, and best practice recommendations. A written personalized care plan for preventive services as well as general preventive health recommendations were provided to patient.     Mickeal Needy, LPN   1/61/0960   After Visit Summary: Mailed to daughter.  Nurse Notes: Cognitive status assessed by direct observation. Patient has current diagnosis of cognitive impairment. Patient is followed by neurology for ongoing assessment. Patient is unable to complete screening 6CIT or MMSE.

## 2023-04-18 NOTE — Progress Notes (Signed)
Patient ID: Alyssa Fernandez, female   DOB: 18-Jun-1941, 82 y.o.   MRN: [id number]         Chief Complaint:: wellness exam and Dizziness (And low BP, says the assisted living is giving her the BP meds before checking her BP )  , diarrhea, ckd3a, htn, hld, low thyroid       HPI:  Alyssa Fernandez is a 82 y.o. female here for wellness exam with family, declines flu shot, o/w up to date                        Also Pt denies chest pain, increased sob or doe, wheezing, orthopnea, PND, increased LE swelling, palpitations, or syncope, except does have weak spells and dizziness when staff at her assisted living administer the amlodipine 10 mg without checking her BP , when it should be given only for SBP > 130.   Pt denies polydipsia, polyuria, or new focal neuro s/s.    Pt denies fever, wt loss, night sweats, loss of appetite, or other constitutional symptoms  Denies worsening reflux, abd pain, dysphagia, n/v, bowel change or blood except for ongoing diarrhea, has GI f/u soon.    Wt Readings from Last 3 Encounters:  04/18/23 125 lb 12.8 oz (57.1 kg)  04/18/23 125 lb 12.8 oz (57.1 kg)  12/28/22 118 lb (53.5 kg)   BP Readings from Last 3 Encounters:  04/18/23 102/60  04/18/23 102/60  12/28/22 (!) 86/54   Immunization History  Administered Date(s) Administered   Fluad Quad(high Dose 65+) 04/19/2019, 09/27/2020, 05/13/2021, 06/13/2022   Influenza Split 06/02/2011, 05/21/2012   Influenza Whole 06/12/2007   Influenza, High Dose Seasonal PF 04/20/2017   Influenza,inj,Quad PF,6+ Mos 05/22/2013, 04/08/2014, 06/03/2015, 05/04/2016   Influenza,inj,quad, With Preservative 05/21/2017   PFIZER Comirnaty(Gray Top)Covid-19 Tri-Sucrose Vaccine 01/03/2020, 01/24/2020, 09/16/2020   Pneumococcal Conjugate-13 05/22/2013   Pneumococcal Polysaccharide-23 06/12/2007   Td 10/16/2006   Tdap 04/20/2017   Zoster Recombinant(Shingrix) 02/15/2018, 04/10/2018   Zoster, Live 10/16/2006   Health Maintenance Due  Topic Date  Due   INFLUENZA VACCINE  03/22/2023      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   Severe by CT April 2012   Degenerative joint disease 05/26/2014   Dehydration 05/06/2012   Dermatitis 01/20/2010   Dyspnea 02/03/2014   Easy bruising 12/02/2015   Epigastric pain 12/14/2021   Essential hypertension 02/24/2010   Fatigue 11/25/2014   Generalized anxiety disorder 11/16/2011   GERD    Hepatic abscess 05/03/2012   History of kidney stones    History of skin cancer    leg   Hives 04/08/2014   Hyperlipidemia    Hypokalemia 05/06/2012   Hypothyroidism 02/12/2008   Insomnia    Internal hemorrhoids 03/22/2022   Irritable bowel syndrome with diarrhea 10/16/2022   Liver abscess 06/19/2012   Low blood pressure 06/19/2012   Lumbar radiculopathy 05/27/2021   Major depressive disorder 12/14/2021   Menopausal and postmenopausal disorder 03/09/2009   Mild dementia, concerns for Alzheimer's disease 12/12/2022   Normocytic anemia 05/07/2012   Osteopenia    Osteoporosis 01/30/2018  Penetrating forearm wound, left 05/16/2020   Peripheral neuropathy 02/05/2013   Polycystic kidney 02/12/2008   Pruritus ani 08/17/2022   Rash 11/20/2011   Rectal prolapse s/p robotic LAR/rectopexy 07/26/2022   Right wrist pain 11/09/2019   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   Tachycardia, unspecified 10/05/2009   UTI (urinary tract infection) 03/22/2022   Vaginal itching 06/26/2012   Vitamin D deficiency 11/10/2009   Past Surgical History:  Procedure Laterality Date   ABDOMINAL HYSTERECTOMY     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     bladder  operation, but per sling procedure     BUNIONECTOMY     CERVICAL DISC SURGERY     fx rt ankle     INGUINAL HERNIA REPAIR Left 10/26/2022   Procedure: LAPAROSCOPIC BILATERAL SPIGELIAN HERNIA, BILATERAL INGUINAL HERNIA  AND LEFT FEMORAL HERNIA REPAIR WITH MESH AND TAP BLOCK;  Surgeon: Karie Soda, MD;  Location: [facility];  Service: General;  Laterality: Left;  120 ROOM 5   left temporal bx artery-negative 2      PARTIAL NEPHRECTOMY Left    percutaneous drainage of liver abscess  05/06/2012   PROCTOSCOPY N/A 07/26/2022   Procedure: RIGID PROCTOSCOPY;  Surgeon: Karie Soda, MD;  Location: [facility];  Service: General;  Laterality: N/A;   RECTOPEXY N/A 07/26/2022   Procedure: RECTOPEXY;  Surgeon: Karie Soda, MD;  Location: [facility];  Service: General;  Laterality: N/A;    reports that she has never smoked. She has never used smokeless tobacco. She reports that she does not drink alcohol and does not use drugs. family history includes Bipolar disorder in her brother; Memory loss in an other family member; Stomach cancer in her sister; Stroke in her mother. Allergies  Allergen Reactions   Codeine Swelling   Shellfish Allergy Swelling    Throat swells   Sulfonamide Derivatives Swelling   Zocor [Simvastatin] Other (See Comments)    Cognitive decrease   Current Outpatient Medications on File Prior to Visit  Medication Sig Dispense Refill   ALPRAZolam (XANAX) 0.5 MG tablet 1 tab by mouth twice per day as needed 60 tablet 5   amLODipine (NORVASC) 10 MG tablet Take 10 mg by mouth in the morning. (0800) HOLD FOR SBP LESS THAN 110     ARIPiprazole (ABILIFY) 5 MG tablet Take 1 tablet (5 mg total) by mouth daily. 90 tablet 3   ascorbic acid (VITAMIN C) 500 MG tablet Take 500 mg by mouth in the morning. (0800)     donepezil (ARICEPT) 10 MG tablet Take 1 tablet (10 mg total) by mouth at bedtime. (Patient taking differently: Take 10 mg by mouth at bedtime. (2000)) 90 tablet 3   hydrochlorothiazide  (HYDRODIURIL) 25 MG tablet Take 25 mg by mouth in the morning. (0800)     labetalol (NORMODYNE) 300 MG tablet Take 1 tablet (300 mg total) by mouth 2 (two) times daily. 180 tablet 3   psyllium (HYDROCIL/METAMUCIL) 95 % PACK Take 1 packet by mouth daily as needed for mild constipation.     rosuvastatin (CRESTOR) 20 MG tablet Take 20 mg by mouth daily at 8 pm. (2000)     sertraline (ZOLOFT) 100 MG tablet 1 tab by mouth once daily 90 tablet 3   telmisartan (MICARDIS) 80 MG tablet Take 1 tablet (80 mg total) by mouth daily. 90 tablet 3   traMADol (ULTRAM) 50 MG tablet Take 1-2 tablets (50-100 mg total) by mouth every 6 (six) hours as needed for moderate pain or severe pain. 20 tablet  0   vitamin B-12 (CYANOCOBALAMIN) 1000 MCG tablet Take 1 tablet (1,000 mcg total) by mouth daily. 90 tablet 3   ferrous sulfate 325 (65 FE) MG tablet Take 1 tablet (325 mg total) by mouth daily. (Patient taking differently: Take 325 mg by mouth daily. (0800)) 30 tablet 2   No current facility-administered medications on file prior to visit.        ROS:  All others reviewed and negative.  Objective        PE:  BP 102/60 (BP Location: Left Arm, Patient Position: Sitting, Cuff Size: Normal)   Pulse (!) 56   Temp 98.5 F (36.9 C) (Oral)   Ht 5' 3.5" (1.613 m)   Wt 125 lb 12.8 oz (57.1 kg)   SpO2 90%   BMI 21.93 kg/m                 Constitutional: Pt appears in NAD               HENT: Head: NCAT.                Right Ear: External ear normal.                 Left Ear: External ear normal.                Eyes: . Pupils are equal, round, and reactive to light. Conjunctivae and EOM are normal               Nose: without d/c or deformity               Neck: Neck supple. Gross normal ROM               Cardiovascular: Normal rate and regular rhythm.                 Pulmonary/Chest: Effort normal and breath sounds without rales or wheezing.                Abd:  Soft, NT, ND, + BS, no organomegaly                Neurological: Pt is alert. At baseline orientation, motor grossly intact               Skin: Skin is warm. No rashes, no other new lesions, LE edema - none               Psychiatric: Pt behavior is normal without agitation   Micro: none  Cardiac tracings I have personally interpreted today:  none  Pertinent Radiological findings (summarize): none   Lab Results  Component Value Date   WBC 6.4 04/18/2023   HGB 13.4 04/18/2023   HCT 40.5 04/18/2023   PLT 210.0 04/18/2023   GLUCOSE 104 (H) 04/18/2023   CHOL 136 04/18/2023   TRIG 100.0 04/18/2023   HDL 51.90 04/18/2023   LDLCALC 64 04/18/2023   ALT 10 04/18/2023   AST 16 04/18/2023   NA 138 04/18/2023   K 3.6 04/18/2023   CL 99 04/18/2023   CREATININE 1.27 (H) 04/18/2023   BUN 29 (H) 04/18/2023   CO2 31 04/18/2023   TSH 1.94 04/18/2023   INR 1.14 04/14/2017   HGBA1C 5.6 04/18/2023   MICROALBUR 0.9 04/18/2023   Assessment/Plan:  Alyssa Fernandez is a 82 y.o. White or Caucasian [1] female with  has a past medical history of Acute pyelonephritis (04/13/2017), Atherosclerosis of aorta (HCC) (08/01/2017), Bilateral foot  pain (11/20/2012), Cervical spine degeneration, Chronic low back pain (05/04/2016), CKD (chronic kidney disease) stage 3, GFR 30-59 ml/min (HCC) (08/01/2017), Decreased anal sphincter tone (06/21/2022), Degeneration of lumbar intervertebral disc (11/16/2011), Degenerative joint disease (05/26/2014), Dehydration (05/06/2012), Dermatitis (01/20/2010), Dyspnea (02/03/2014), Easy bruising (12/02/2015), Epigastric pain (12/14/2021), Essential hypertension (02/24/2010), Fatigue (11/25/2014), Generalized anxiety disorder (11/16/2011), GERD, Hepatic abscess (05/03/2012), History of kidney stones, History of skin cancer, Hives (04/08/2014), Hyperlipidemia, Hypokalemia (05/06/2012), Hypothyroidism (02/12/2008), Insomnia, Internal hemorrhoids (03/22/2022), Irritable bowel syndrome with diarrhea (10/16/2022), Liver abscess (06/19/2012),  Low blood pressure (06/19/2012), Lumbar radiculopathy (05/27/2021), Major depressive disorder (12/14/2021), Menopausal and postmenopausal disorder (03/09/2009), Mild dementia, concerns for Alzheimer's disease (12/12/2022), Normocytic anemia (05/07/2012), Osteopenia, Osteoporosis (01/30/2018), Penetrating forearm wound, left (05/16/2020), Peripheral neuropathy (02/05/2013), Polycystic kidney (02/12/2008), Pruritus ani (08/17/2022), Rash (11/20/2011), Rectal prolapse s/p robotic LAR/rectopexy (07/26/2022), Right wrist pain (11/09/2019), Spigelian hernia (10/16/2022), Tachycardia, unspecified (10/05/2009), UTI (urinary tract infection) (03/22/2022), Vaginal itching (06/26/2012), and Vitamin D deficiency (11/10/2009).  Encounter for well adult exam with abnormal findings Age and sex appropriate education and counseling updated with regular exercise and diet Referrals for preventative services - none needed Immunizations addressed - declines flu shot Smoking counseling  - none needed Evidence for depression or other mood disorder - none significant Most recent labs reviewed. I have personally reviewed and have noted: 1) the patient's medical and social history 2) The patient's current medications and supplements 3) The patient's height, weight, and BMI have been recorded in the chart   CKD (chronic kidney disease) stage 3, GFR 30-59 ml/min (HCC) Lab Results  Component Value Date   CREATININE 1.27 (H) 04/18/2023   Stable overall, cont to avoid nephrotoxins   Essential hypertension BP Readings from Last 3 Encounters:  04/18/23 102/60  04/18/23 102/60  12/28/22 (!) 86/54   overcontrolled, pt to decrease amlodipine to 5 mg every day prn Sbp > 140 only, cont hct 25 every day, labetolol 300 every day, micardis 80 qd   Hyperlipidemia Lab Results  Component Value Date   LDLCALC 64 04/18/2023   Stable, pt to continue current statin crestor 20 mg qd   Hypothyroidism Lab Results  Component  Value Date   TSH 1.94 04/18/2023   Stable, pt does not need replacement at this time   Major depressive disorder Stable overall, declines need for ssri or other change in tx  Vitamin D deficiency Last vitamin D Lab Results  Component Value Date   VD25OH 35.28 04/18/2023   Low, to start oral replacement  Followup: Return in about 6 months (around 10/19/2023).  Oliver Barre, MD 04/21/2023 12:41 PM [facility] - [facility]

## 2023-04-18 NOTE — Patient Instructions (Signed)
Ok to reduce the amlodipine to 5 mg per day only for SBP more than 140  Please continue all other medications as before, and refills have been done if requested.  Please have the pharmacy call with any other refills you may need.  Please continue your efforts at being more active, low cholesterol diet, and weight control.  You are otherwise up to date with prevention measures today.  Please keep your appointments with your specialists as you may have planned  Please go to the LAB at the blood drawing area for the tests to be done  You will be contacted by phone if any changes need to be made immediately.  Otherwise, you will receive a letter about your results with an explanation, but please check with MyChart first.  Please make an Appointment to return in 6 months, or sooner if needed

## 2023-04-19 ENCOUNTER — Other Ambulatory Visit: Payer: PPO

## 2023-04-19 LAB — VITAMIN D 25 HYDROXY (VIT D DEFICIENCY, FRACTURES): VITD: 35.28 ng/mL (ref 30.00–100.00)

## 2023-04-19 LAB — TSH: TSH: 1.94 u[IU]/mL (ref 0.35–5.50)

## 2023-04-19 LAB — VITAMIN B12: Vitamin B-12: >1501 pg/mL — ABNORMAL HIGH (ref 211–911)

## 2023-04-19 NOTE — Progress Notes (Signed)
The test results show that your current treatment is OK, as the tests are stable.  Please continue the same plan.  There is no other need for change of treatment or further evaluation based on these results, at this time.  thanks 

## 2023-04-21 ENCOUNTER — Encounter: Payer: Self-pay | Admitting: Internal Medicine

## 2023-04-21 NOTE — Assessment & Plan Note (Signed)
Lab Results  Component Value Date   TSH 1.94 04/18/2023   Stable, pt does not need replacement at this time

## 2023-04-21 NOTE — Assessment & Plan Note (Signed)
Lab Results  Component Value Date   LDLCALC 64 04/18/2023   Stable, pt to continue current statin crestor 20 mg qd

## 2023-04-21 NOTE — Assessment & Plan Note (Signed)
BP Readings from Last 3 Encounters:  04/18/23 102/60  04/18/23 102/60  12/28/22 (!) 86/54   overcontrolled, pt to decrease amlodipine to 5 mg every day prn Sbp > 140 only, cont hct 25 every day, labetolol 300 every day, micardis 80 qd

## 2023-04-21 NOTE — Assessment & Plan Note (Signed)
Last vitamin D Lab Results  Component Value Date   VD25OH 35.28 04/18/2023   Low, to start oral replacement

## 2023-04-21 NOTE — Assessment & Plan Note (Signed)
Stable overall, declines need for ssri or other change in tx

## 2023-04-21 NOTE — Assessment & Plan Note (Signed)
Age and sex appropriate education and counseling updated with regular exercise and diet Referrals for preventative services - none needed Immunizations addressed - declines flu shot Smoking counseling  - none needed Evidence for depression or other mood disorder - none significant Most recent labs reviewed. I have personally reviewed and have noted: 1) the patient's medical and social history 2) The patient's current medications and supplements 3) The patient's height, weight, and BMI have been recorded in the chart  

## 2023-04-21 NOTE — Assessment & Plan Note (Signed)
Lab Results  Component Value Date   CREATININE 1.27 (H) 04/18/2023   Stable overall, cont to avoid nephrotoxins

## 2023-04-25 ENCOUNTER — Other Ambulatory Visit: Payer: PPO

## 2023-04-25 DIAGNOSIS — R197 Diarrhea, unspecified: Secondary | ICD-10-CM

## 2023-04-26 LAB — CLOSTRIDIUM DIFFICILE BY PCR: Toxigenic C. Difficile by PCR: NEGATIVE

## 2023-05-09 DIAGNOSIS — Z961 Presence of intraocular lens: Secondary | ICD-10-CM | POA: Diagnosis not present

## 2023-05-22 ENCOUNTER — Encounter: Payer: Self-pay | Admitting: Physician Assistant

## 2023-05-22 ENCOUNTER — Other Ambulatory Visit (INDEPENDENT_AMBULATORY_CARE_PROVIDER_SITE_OTHER): Payer: PPO

## 2023-05-22 ENCOUNTER — Ambulatory Visit: Payer: PPO | Admitting: Physician Assistant

## 2023-05-22 VITALS — BP 126/74 | HR 83 | Ht 64.0 in | Wt 131.0 lb

## 2023-05-22 DIAGNOSIS — R197 Diarrhea, unspecified: Secondary | ICD-10-CM

## 2023-05-22 DIAGNOSIS — Z9049 Acquired absence of other specified parts of digestive tract: Secondary | ICD-10-CM | POA: Diagnosis not present

## 2023-05-22 LAB — CBC WITH DIFFERENTIAL/PLATELET
Basophils Absolute: 0 10*3/uL (ref 0.0–0.1)
Basophils Relative: 0.7 % (ref 0.0–3.0)
Eosinophils Absolute: 0.4 10*3/uL (ref 0.0–0.7)
Eosinophils Relative: 7.1 % — ABNORMAL HIGH (ref 0.0–5.0)
HCT: 38.2 % (ref 36.0–46.0)
Hemoglobin: 12.8 g/dL (ref 12.0–15.0)
Lymphocytes Relative: 17.8 % (ref 12.0–46.0)
Lymphs Abs: 1 10*3/uL (ref 0.7–4.0)
MCHC: 33.6 g/dL (ref 30.0–36.0)
MCV: 91.7 fL (ref 78.0–100.0)
Monocytes Absolute: 0.4 10*3/uL (ref 0.1–1.0)
Monocytes Relative: 6.7 % (ref 3.0–12.0)
Neutro Abs: 3.7 10*3/uL (ref 1.4–7.7)
Neutrophils Relative %: 67.7 % (ref 43.0–77.0)
Platelets: 201 10*3/uL (ref 150.0–400.0)
RBC: 4.17 Mil/uL (ref 3.87–5.11)
RDW: 12.6 % (ref 11.5–15.5)
WBC: 5.5 10*3/uL (ref 4.0–10.5)

## 2023-05-22 LAB — COMPREHENSIVE METABOLIC PANEL
ALT: 8 U/L (ref 0–35)
AST: 14 U/L (ref 0–37)
Albumin: 4.2 g/dL (ref 3.5–5.2)
Alkaline Phosphatase: 56 U/L (ref 39–117)
BUN: 21 mg/dL (ref 6–23)
CO2: 26 meq/L (ref 19–32)
Calcium: 9.1 mg/dL (ref 8.4–10.5)
Chloride: 105 meq/L (ref 96–112)
Creatinine, Ser: 1.34 mg/dL — ABNORMAL HIGH (ref 0.40–1.20)
GFR: 36.51 mL/min — ABNORMAL LOW (ref >60.00)
Glucose, Bld: 130 mg/dL — ABNORMAL HIGH (ref 70–99)
Potassium: 4.1 meq/L (ref 3.5–5.1)
Sodium: 139 meq/L (ref 135–145)
Total Bilirubin: 0.8 mg/dL (ref 0.2–1.2)
Total Protein: 6.4 g/dL (ref 6.0–8.3)

## 2023-05-22 LAB — TSH: TSH: 2.55 u[IU]/mL (ref 0.35–5.50)

## 2023-05-22 NOTE — Progress Notes (Signed)
Chief Complaint: Diarrhea  HPI:    Mrs. Gaster is a 82 year old female with a past medical history as listed below including CKD, GERD, rectal prolapse, generalized anxiety disorder and multiple others, known to Dr. Myrtie Neither, who presents to clinic today for complaint of diarrhea.    07/25/2022 colonoscopy with rectal prolapse manually reduced, diverticulosis in the sigmoid colon, congested erythematous and eroded linear patch of mucus in the distal rectum.    04/16/2023 patient called our clinic and described that she had liquid diarrhea all morning.  At that time also especially been having intermittent diarrhea for months.  From liquid to soft stools.  No new medications.  Apparently passing 7 stools a day and taking 2 fiber pills daily and Metamucil every other day.  Dr. Myrtie Neither discussed that she had not been seen since colonoscopy in December 2023 and she had underwent rectosigmoid resection and rectopexy for rectal prolapse.  Recommended to C. difficile with a stool PCR test and then Imodium.    04/18/2023 patient saw PCP and discussed diarrhea.  Labs including CBC, TSH, hepatic function panel, lipids, hemoglobin A1c all normal.  BMP with a creatinine of 1.27.    04/25/23 C. difficile PCR negative.    Today, patient presents to clinic accompanied by her daughter.  Her daughter tells me that she gets 3-4, phone calls a week explaining that her mom is having loose watery stools 5+ per the nursing staff at her nursing facility.  The patient tells me she has been writing down her stools and it actually has been getting better since her surgery back in September, she feels like she can predict that she will have 2-3 soft or even semiformed bowel movements in the morning and sometimes occasionally more until noon and then no further bowel movements after that.  These are urgent but no abdominal pain.  This has been occurring since time of her surgery and is actually getting better.  They added Fiber, 2 tabs twice  daily on 8/29 and stools have started to be more formed.  Patient even describes solid stools occasionally.  Does tell me that maybe 2 days out of the week she will have worsened diarrhea and she thinks is due to what she is eating but "I watch my diet".  Patient is really not bothered by her current symptoms but it does worry her daughter slightly.  No blood in her stool.  When she does take Imodium as needed it helps/stops her stool completely for the day.    Denies fever, chills, weight loss or blood in her stool.  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   Severe by CT April 2012   Degenerative joint disease 05/26/2014   Dehydration 05/06/2012   Dermatitis 01/20/2010   Dyspnea 02/03/2014   Easy bruising 12/02/2015   Epigastric pain 12/14/2021   Essential hypertension 02/24/2010   Fatigue 11/25/2014   Generalized anxiety disorder 11/16/2011   GERD    Hepatic abscess 05/03/2012   History of kidney stones    History of skin cancer    leg   Hives 04/08/2014   Hyperlipidemia  Hypokalemia 05/06/2012   Hypothyroidism 02/12/2008   Insomnia    Internal hemorrhoids 03/22/2022   Irritable bowel syndrome with diarrhea 10/16/2022   Liver abscess 06/19/2012   Low blood pressure 06/19/2012   Lumbar radiculopathy 05/27/2021   Major depressive disorder 12/14/2021   Menopausal and postmenopausal disorder 03/09/2009   Mild dementia, concerns for Alzheimer's disease 12/12/2022   Normocytic anemia 05/07/2012   Osteopenia    Osteoporosis 01/30/2018   Penetrating forearm wound, left 05/16/2020   Peripheral neuropathy  02/05/2013   Polycystic kidney 02/12/2008   Pruritus ani 08/17/2022   Rash 11/20/2011   Rectal prolapse s/p robotic LAR/rectopexy 07/26/2022   Right wrist pain 11/09/2019   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   Tachycardia, unspecified 10/05/2009   UTI (urinary tract infection) 03/22/2022   Vaginal itching 06/26/2012   Vitamin D deficiency 11/10/2009    Past Surgical History:  Procedure Laterality Date   ABDOMINAL HYSTERECTOMY     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     bladder operation, but per sling procedure     BUNIONECTOMY     CERVICAL DISC SURGERY     fx rt ankle     INGUINAL HERNIA REPAIR Left 10/26/2022   Procedure: LAPAROSCOPIC BILATERAL SPIGELIAN HERNIA, BILATERAL INGUINAL HERNIA  AND LEFT FEMORAL HERNIA REPAIR WITH MESH AND TAP BLOCK;  Surgeon: Karie Soda, MD;  Location: [facility];  Service: General;  Laterality: Left;  120 ROOM 5   left temporal bx artery-negative 2      PARTIAL NEPHRECTOMY Left    percutaneous drainage of liver abscess  05/06/2012   PROCTOSCOPY N/A 07/26/2022   Procedure: RIGID PROCTOSCOPY;  Surgeon: Karie Soda, MD;  Location: [facility];  Service: General;  Laterality: N/A;   RECTOPEXY N/A 07/26/2022   Procedure: RECTOPEXY;  Surgeon: Karie Soda, MD;  Location: [facility];  Service: General;  Laterality: N/A;    Current Outpatient Medications  Medication Sig Dispense Refill   ALPRAZolam (XANAX) 0.5 MG tablet 1 tab by mouth twice per day as needed 60 tablet 5   amLODipine (NORVASC) 10 MG tablet Take 10 mg by mouth in the morning. (0800) HOLD FOR SBP LESS THAN 110     ARIPiprazole (ABILIFY) 5 MG tablet Take 1 tablet (5 mg total) by mouth daily. 90 tablet 3   ascorbic acid (VITAMIN C) 500 MG tablet Take 500 mg by mouth in the morning. (0800)     donepezil (ARICEPT) 10 MG tablet Take 1 tablet (10 mg total) by mouth at bedtime. (Patient taking differently: Take 10 mg by mouth at bedtime. (2000)) 90 tablet 3   ferrous sulfate 325 (65 FE) MG tablet Take 1 tablet (325 mg total)  by mouth daily. (Patient taking differently: Take 325 mg by mouth daily. (0800)) 30 tablet 2   hydrochlorothiazide (HYDRODIURIL) 25 MG tablet Take 25 mg by mouth in the morning. (0800)     labetalol (NORMODYNE) 300 MG tablet Take 1 tablet (300 mg total) by mouth 2 (two) times daily. 180 tablet 3   psyllium (HYDROCIL/METAMUCIL) 95 % PACK Take 1 packet by mouth daily as needed for mild constipation.     rosuvastatin (CRESTOR) 20 MG tablet Take 20 mg by mouth daily at 8 pm. (2000)     sertraline (ZOLOFT) 100 MG tablet 1 tab by mouth once daily 90 tablet 3   telmisartan (MICARDIS) 80 MG tablet Take 1 tablet (80 mg total) by mouth daily. 90 tablet 3   traMADol (ULTRAM) 50 MG tablet Take 1-2 tablets (50-100 mg  total) by mouth every 6 (six) hours as needed for moderate pain or severe pain. 20 tablet 0   vitamin B-12 (CYANOCOBALAMIN) 1000 MCG tablet Take 1 tablet (1,000 mcg total) by mouth daily. 90 tablet 3   No current facility-administered medications for this visit.    Allergies as of 05/22/2023 - Review Complete 05/22/2023  Allergen Reaction Noted   Codeine Swelling 03/25/2007   Shellfish allergy Swelling 07/26/2022   Sulfonamide derivatives Swelling 03/25/2007   Zocor [simvastatin] Other (See Comments) 06/03/2015    Family History  Problem Relation Age of Onset   Stroke Mother    Stomach cancer Sister    Bipolar disorder Brother    Memory loss Other        grandparent   Colon cancer Neg Hx    Colon polyps Neg Hx    Esophageal cancer Neg Hx    Rectal cancer Neg Hx     Social History   Socioeconomic History   Marital status: Widowed    Spouse name: Not on file   Number of children: 1   Years of education: 14   Highest education level: Some college, no degree  Occupational History   Occupation: Retired  Tobacco Use   Smoking status: Never   Smokeless tobacco: Never  Vaping Use   Vaping status: Never Used  Substance and Sexual Activity   Alcohol use: No   Drug use: No    Sexual activity: Not Currently    Birth control/protection: Post-menopausal  Other Topics Concern   Not on file  Social History Narrative   Widowed since December 2021; married for over 40 years.   Lives in a 2-story home with cat.   Right handed   No caffeine   Social Determinants of Health   Financial Resource Strain: Patient Declined (04/18/2023)   Overall Financial Resource Strain (CARDIA)    Difficulty of Paying Living Expenses: Patient declined  Food Insecurity: Patient Declined (04/18/2023)   Hunger Vital Sign    Worried About Running Out of Food in the Last Year: Patient declined    Ran Out of Food in the Last Year: Patient declined  Transportation Needs: No Transportation Needs (04/18/2023)   PRAPARE - Administrator, Civil Service (Medical): No    Lack of Transportation (Non-Medical): No  Physical Activity: Insufficiently Active (04/18/2023)   Exercise Vital Sign    Days of Exercise per Week: 4 days    Minutes of Exercise per Session: 20 min  Stress: Stress Concern Present (04/18/2023)   [employer] of Occupational Health - Occupational Stress Questionnaire    Feeling of Stress : To some extent  Social Connections: Unknown (04/18/2023)   Social Connection and Isolation Panel [NHANES]    Frequency of Communication with Friends and Family: More than three times a week    Frequency of Social Gatherings with Friends and Family: More than three times a week    Attends Religious Services: Not on file    Active Member of Clubs or Organizations: No    Attends Engineer, structural: More than 4 times per year    Marital Status: Widowed  Intimate Partner Violence: Not At Risk (04/18/2023)   Humiliation, Afraid, Rape, and Kick questionnaire    Fear of Current or Ex-Partner: No    Emotionally Abused: No    Physically Abused: No    Sexually Abused: No    Review of Systems:    Constitutional: No weight loss, fever or chills Cardiovascular: No chest  pain,  chest pressure or palpitations   Respiratory: No SOB  Gastrointestinal: See HPI and otherwise negative   Physical Exam:  Vital signs: BP 126/74   Pulse 83   Ht 5\' 4"  (1.626 m)   Wt 131 lb (59.4 kg)   BMI 22.49 kg/m   Constitutional:   Pleasant Elderly Caucasian female appears to be in NAD, Well developed, Well nourished, alert and cooperative Respiratory: Respirations even and unlabored. Lungs clear to auscultation bilaterally.   No wheezes, crackles, or rhonchi.  Cardiovascular: Normal S1, S2. No MRG. Regular rate and rhythm. No peripheral edema, cyanosis or pallor.  Gastrointestinal:  Soft, nondistended, nontender. No rebound or guarding. Normal bowel sounds. No appreciable masses or hepatomegaly. Rectal:  Not performed.  Psychiatric: Demonstrates good judgement and reason without abnormal affect or behaviors.  RELEVANT LABS AND IMAGING: CBC    Component Value Date/Time   WBC 6.4 04/18/2023 1231   RBC 4.44 04/18/2023 1231   HGB 13.4 04/18/2023 1231   HCT 40.5 04/18/2023 1231   PLT 210.0 04/18/2023 1231   MCV 91.4 04/18/2023 1231   MCH 29.0 10/24/2022 0821   MCHC 33.0 04/18/2023 1231   RDW 13.2 04/18/2023 1231   LYMPHSABS 1.4 04/18/2023 1231   MONOABS 0.5 04/18/2023 1231   EOSABS 0.3 04/18/2023 1231   BASOSABS 0.0 04/18/2023 1231    CMP     Component Value Date/Time   NA 138 04/18/2023 1231   K 3.6 04/18/2023 1231   CL 99 04/18/2023 1231   CO2 31 04/18/2023 1231   GLUCOSE 104 (H) 04/18/2023 1231   BUN 29 (H) 04/18/2023 1231   CREATININE 1.27 (H) 04/18/2023 1231   CREATININE 0.81 06/21/2012 1217   CALCIUM 9.6 04/18/2023 1231   PROT 6.7 04/18/2023 1231   ALBUMIN 4.3 04/18/2023 1231   AST 16 04/18/2023 1231   ALT 10 04/18/2023 1231   ALKPHOS 57 04/18/2023 1231   BILITOT 0.7 04/18/2023 1231   GFRNONAA 46 (L) 10/24/2022 0821   GFRNONAA 72 06/21/2012 1217   GFRAA >60 04/16/2017 0535   GFRAA 83 06/21/2012 1217    Assessment: 1.  Diarrhea: Ever since  rectosigmoid resection and rectopexy in September of last year, per patient this is actually been getting better and she is now seeing some formed her stools with addition of 2 fiber tablets twice daily at the end of August, still has occasional days where she will have 5+ watery stools, thinks it is related to think she is eating, Imodium does help, recent C. difficile testing negative; most likely malabsorption versus infectious cause versus other  Plan: 1.  Ordered further stool studies today including a GI pathogen panel, fecal calprotectin and lactoferrin.  Also will check CBC, CMP and TSH. 2.  Did discuss that patient could take Imodium 1/2-1 tab daily to help slow her stools but patient and daughter do not want to do that.  They are happy with her current system now that if the daughter gets called about lots of diarrhea she reminds her mom to ask for Imodium and then it works. 3.  Patient to follow in clinic per recommendations after stool studies above.  The overall picture is that they are both okay at the moment with how things are going but just want to make sure that it does not get worse and is not due to anything else other than her surgery.  Hyacinth Meeker, PA-C [facility] 05/22/2023, 9:28 AM

## 2023-05-22 NOTE — Patient Instructions (Signed)
Your provider has requested that you go to the basement level for lab work before leaving today. Press "B" on the elevator. The lab is located at the first door on the left as you exit the elevator.   Follow up per recommendations after lab work  Due to recent changes in healthcare laws, you may see the results of your imaging and laboratory studies on MyChart before your provider has had a chance to review them.  We understand that in some cases there may be results that are confusing or concerning to you. Not all laboratory results come back in the same time frame and the provider may be waiting for multiple results in order to interpret others.  Please give [place] 48 hours in order for your provider to thoroughly review all the results before contacting the office for clarification of your results.    I appreciate the  opportunity to care for you  Thank You   Jacelyn Grip

## 2023-05-23 NOTE — Progress Notes (Signed)
This is actually Dr. Frankey Shown patient as supervising physician for her September 2020 office visit.  She was put on my schedule for colonoscopy at that time because I had availability the specific day it was needed prior to her planned surgery.  I then took the August phone call from my nurse because I was familiar with the patient and Dr. Barron Alvine was not available that day.  I have copied him on this note for any further comments or recommendation.  - H. Danis

## 2023-05-24 DIAGNOSIS — N390 Urinary tract infection, site not specified: Secondary | ICD-10-CM | POA: Diagnosis not present

## 2023-05-28 ENCOUNTER — Telehealth: Payer: Self-pay | Admitting: Internal Medicine

## 2023-05-28 ENCOUNTER — Other Ambulatory Visit: Payer: Self-pay

## 2023-05-28 DIAGNOSIS — R197 Diarrhea, unspecified: Secondary | ICD-10-CM

## 2023-05-28 NOTE — Telephone Encounter (Signed)
Pt daughter called stating the facility the pt is in is reporting her  blood pressure being running high in the morning going about a week now. Pt stated she like to speak with a nurse.

## 2023-05-28 NOTE — Progress Notes (Signed)
Agree with the assessment and plan as outlined by Jennifer Lemmon, PA-C. ? ?Jaja Switalski, DO, FACG ? ?

## 2023-05-29 LAB — FECAL LACTOFERRIN, QUANT
Fecal Lactoferrin: POSITIVE — AB
MICRO NUMBER:: 15560814
SPECIMEN QUALITY:: ADEQUATE

## 2023-05-29 LAB — GI PROFILE, STOOL, PCR

## 2023-05-30 ENCOUNTER — Telehealth: Payer: Self-pay | Admitting: *Deleted

## 2023-05-30 LAB — CALPROTECTIN, FECAL: Calprotectin, Fecal: 121 ug/g — ABNORMAL HIGH (ref 0–120)

## 2023-05-30 NOTE — Telephone Encounter (Signed)
Patient's daughter called to inform that her mother has the Norovirus as noted per stool studies done. Also informed the daughter the norovirus is contagious, the patient may exhibit diarrhea, stomach pain, vomiting and in some cases fever or headache. Patient is in a nursing facility, informed the daughter, to notify the nurse or someone in management due to the contagiousness of the virus. Also let the daughter know the virus will run its course in 3 days. Ms. Alyssa Fernandez states if the patient is having issue with diarrhea, she will prescribe a ABX. Daughter states her mother is not having diarrhea that she is aware.

## 2023-05-30 NOTE — Telephone Encounter (Signed)
-----   Message from Unk Lightning sent at 05/30/2023  8:35 AM EDT ----- Please let patient/her daughter know that her stool studies were positive for the norovirus, sometimes this is erroneously positive in her stool studies, I am not sure that this really fits her symptoms, but it typically does run its course and then get better on its own.  If her diarrhea becomes a problem again we could consider treating this with an antibiotic.  Thanks, JL L

## 2023-05-31 NOTE — Telephone Encounter (Signed)
Patient's daughter called in reference to the previous information given yesterday. Daughter was informed the norovirus will run its course in 3 days or fewer and she wanted to know if she should notify the facility where her mother resides. Informed the daughter, she should notify the facility as well, by informing the nurse and/or DON of facility. Also informed the daughter if the patient is having diarrhea to a point that it is causing problems for the patient to let us know so we can order ABX. Daughter stated yesterday, to her knowledge the patient was not having diarrhea. Informed the daughter the same information that was given on yesterday.

## 2023-05-31 NOTE — Telephone Encounter (Signed)
Inbound call fro patient daughter requesting a call back in regards to previous note. Please advise.   Thank you

## 2023-06-01 NOTE — Telephone Encounter (Signed)
Daughter called wanting to know if it was necessary for a follow-up appt and if her mother needs to be re-tested for the Norovirus. Patient is presently living in a facility, although she does not share a room with anyone. Daughter states her mother is not exhibiting any symptoms at this time, no fever, no diarrhea and no complaints of abdominal pain. It has been 3 days or more since 05/28/23 when patent was tested. Pease advise.

## 2023-06-01 NOTE — Telephone Encounter (Signed)
Inbound call from patient daughter in regards to previous note. Requesting to be further advised.

## 2023-06-01 NOTE — Telephone Encounter (Signed)
Called patient's daughter to inform her that her mother does not need to be re- tested for the Norovirus as long as she is feeling better. Daughter in agreement.

## 2023-06-05 DIAGNOSIS — I10 Essential (primary) hypertension: Secondary | ICD-10-CM | POA: Diagnosis not present

## 2023-06-05 DIAGNOSIS — G479 Sleep disorder, unspecified: Secondary | ICD-10-CM | POA: Diagnosis not present

## 2023-06-05 DIAGNOSIS — E876 Hypokalemia: Secondary | ICD-10-CM | POA: Diagnosis not present

## 2023-06-12 DIAGNOSIS — I10 Essential (primary) hypertension: Secondary | ICD-10-CM | POA: Diagnosis not present

## 2023-07-02 ENCOUNTER — Ambulatory Visit: Payer: PPO | Admitting: Physician Assistant

## 2023-07-02 ENCOUNTER — Encounter: Payer: Self-pay | Admitting: Physician Assistant

## 2023-07-02 VITALS — BP 113/69 | HR 61 | Ht 64.0 in | Wt 134.0 lb

## 2023-07-02 DIAGNOSIS — F02A4 Dementia in other diseases classified elsewhere, mild, with anxiety: Secondary | ICD-10-CM | POA: Diagnosis not present

## 2023-07-02 DIAGNOSIS — G301 Alzheimer's disease with late onset: Secondary | ICD-10-CM

## 2023-07-02 MED ORDER — DONEPEZIL HCL 10 MG PO TABS: 10.0000 mg | ORAL_TABLET | Freq: Every day | ORAL | 3 refills | Status: DC

## 2023-07-02 MED ORDER — MEMANTINE HCL 5 MG PO TABS: ORAL_TABLET | ORAL | 11 refills | Status: DC

## 2023-07-02 NOTE — Progress Notes (Signed)
Assessment/Plan:   Dementia likely due to Alzheimer's disease with behavioral disturbance (anxiety)    Alyssa Fernandez is a very pleasant 82 y.o. RH female with a history of with  a history of hypertension, hyperlipidemia, situational depression, anxiety, hypothyroidism, insomnia, narcolepsy, CKD, vitamin D deficiency, arthritis, and a diagnosis of mild dementia likely due to Alzheimer's disease with anxiety per neuropsychological evaluation in April 2024 seen today in follow up for memory loss. Patient is currently on donepezil 10 mg daily per PCP.  MMSE today is 26/30.  Memory remains stable.  She is able to participate on her ADLs without difficulty.  She no longer drives.    Follow up in  6 months. Donepezil 10 mg daily, side effects discussed Start memantine 5-minute times twice daily, side effects discussed Recommend good control of her cardiovascular risk factors Continue to control mood as per PCP Repeat neuropsych evaluation in 12 to 18 months if anxiety is better controlled.    Subjective:    This patient is accompanied in the office by her daughter who supplements the history.  Previous records as well as any outside records available were reviewed prior to todays visit. Patient was last seen on May 2024   Any changes in memory since last visit? " I seem to be more alert".  She enjoys participating in activities at assisted living, she has a "nice click "there.  She loves bingo, and other social activities  She continues to have trouble understanding some instructions for statements or remembering recent conversations and names.  She likes to write a journal and a calendar. repeats oneself?  Endorsed Disoriented when walking into a room?  Patient denies  Leaving objects?  May misplace things but not in unusual places. She has misplaced her financial stuff so her daughter lets her sign and then takes it back   Wandering behavior?  denies   Any personality changes since last  visit?  She called her daughter that she was depressed because her daughter was not going to let her see her BF    Any worsening depression?:  Denies.   Hallucinations or paranoia?  Denies.   Seizures? denies    Any sleep changes?  Sleeps well.  Denies vivid dreams, REM behavior or sleepwalking   Sleep apnea?   Denies.   Any hygiene concerns? Denies.  Independent of bathing and dressing?  Endorsed  Does the patient needs help with medications?  Facility is in charge   Who is in charge of the finances? Daughter in charge.      Any changes in appetite?  Denies.     Patient have trouble swallowing? Denies.   Does the patient cook? No Any headaches?   denies   Chronic back pain  denies   Ambulates with difficulty? Denies.  She walks within 1500-300  steps a day  Recent falls or head injuries? Denies.     Unilateral weakness, numbness or tingling? denies   Any tremors?  Denies   Any anosmia?  Denies   Any incontinence of urine?  Endorsed, wears diapers Any bowel dysfunction?  She has history of IBS, episodes of diarrhea intermittently, controlled with Imodium, she also takes fiber pills     Patient lives at [facility]  Does the patient drive? No longer drives    MRI of the brain personally reviewed remarkable for mild to moderate chronic microvascular ischemic changes with parenchymal volume loss and mild atrophy.    History 03/29/2022  The patient  is accompanied by her daughter who supplements the history.   How long did patient have memory difficulties?  Daughter reports that she began showing signs of memory loss about 2 years ago, but has been worse over the last 3 or 4 months, when she has to write down appointments, names, and she has difficulty remembering recent conversations.   Patient lives with:Patient lives alone repeats oneself? Endorsed, for the last 2 years.  Disoriented when walking into a room?  Patient denies   Leaving objects in unusual places?  Patient  denies   Ambulates  with difficulty?   She walks about 1 mile a day, she is not as active as she used to be before the death of her husband.   Recent falls?  Patient denies   Any head injuries?  Patient denies   History of seizures?   Patient denies   Wandering behavior?  Patient denies   Patient drives?  Short distances  Any mood changes ?  Patient is dealing with grief/depression after her husband's death to COVID few years ago Hallucinations?  Patient denies   Paranoia?  Patient denies   Patient reports that sleeps well without vivid dreams, REM behavior or sleepwalking    History of sleep apnea?  Patient denies   Any hygiene concerns?  Patient denies   Independent of bathing and dressing?  Endorsed  Does the patient needs help with medications?  She is in charge of the medications Who is in charge of the finances?  Patient is in charge   Any changes in appetite?  Patient denies   Patient have trouble swallowing? Patient denies   Does the patient cook?  Patient denies   Any kitchen accidents such as leaving the stove on? Patient denies   Any headaches?  Patient denies   Double vision? Patient denies   Any focal numbness or tingling?  Patient denies   Chronic back pain Patient denies   Unilateral weakness?  Patient denies   Any tremors?  Patient denies   Any history of anosmia?  Patient denies   Any incontinence of urine?  Endorsed, wears depends  Any bowel dysfunction?   Hemorrhoids, sometimes lose stools  History of heavy alcohol intake?  Patient denies   History of heavy tobacco use?  Patient denies   Family history of dementia?  Patient denies.    Recent labs 03/13/2022 remarkable for hemoglobin 11, hematocrit 32.4, HDL 33.2, sodium 132, 4, TSH 3.09.1.     MRI brain 04/2022  personally reviewed : No evidence of recent infarction, hemorrhage, or mass.Mild to moderate chronic microvascular ischemic changes. Parenchymal volume loss without lobar predilection or disproportionate  hippocampal atrophy.     Neuropsychological evaluation 12/19/2022 Briefly, results suggested severe impairment surrounding essentially all aspects of learning and memory. An additional isolated impairment was exhibited across a line orientation task. However, other visuospatial tasks were appropriate. The etiology for her mild dementia presentation is somewhat unclear. However, I do have concerns for an underlying neurodegenerative illness, namely Alzheimer's disease. Across memory testing, Alyssa Fernandez did not benefit from repeated exposure to novel information and was fully amnestic (i.e., 0% retention) across all memory testing after a brief delay. Across recognition testing, she became frustrated due to poor performance and ultimately refused to complete one task. The other two similar tasks exhibited variable performances; however, she made comments surrounding guessing across her stronger performance, suggesting that this may be an inflation of true abilities. Overall, memory performances suggest rapid forgetting and an  evolving and already quite severe storage impairment, both of which are the hallmark memory characteristics of this illness. Intact performances across the majority of non-memory cognitive abilities is certainly encouraging and would suggest that this disease process remains in early stages should it truly be present.      PREVIOUS MEDICATIONS:   CURRENT MEDICATIONS:  Outpatient Encounter Medications as of 07/02/2023  Medication Sig   acetaminophen (TYLENOL) 325 MG tablet Take 650 mg by mouth every 6 (six) hours as needed.   amLODipine (NORVASC) 5 MG tablet Take 5 mg by mouth daily.   ascorbic acid (VITAMIN C) 500 MG tablet Take 500 mg by mouth in the morning. (0800)   diclofenac Sodium (VOLTAREN) 1 % GEL Apply 2 g topically 4 (four) times daily.   donepezil (ARICEPT) 10 MG tablet Take 1 tablet (10 mg total) by mouth daily.   ferrous sulfate 325 (65 FE) MG tablet Take 1 tablet (325  mg total) by mouth daily. (Patient taking differently: Take 325 mg by mouth daily. (0800))   labetalol (NORMODYNE) 300 MG tablet Take 300 mg by mouth 2 (two) times daily.   loperamide (IMODIUM A-D) 2 MG tablet Take 2 mg by mouth 4 (four) times daily as needed for diarrhea or loose stools.   Melatonin 10 MG TABS Take 10 mg by mouth as needed.   memantine (NAMENDA) 5 MG tablet Take 1 tablet (5 mg at night) for 2 weeks, then increase to 1 tablet (5 mg) twice a day   NON FORMULARY Take 2 capsules by mouth in the morning, at noon, and at bedtime.   potassium chloride (KLOR-CON) 10 MEQ tablet Take 10 mEq by mouth daily.   PSYLLIUM PO Take 0.52 g by mouth as needed (2 tablespoons).   rosuvastatin (CRESTOR) 20 MG tablet Take 20 mg by mouth daily at 8 pm. (2000)   sertraline (ZOLOFT) 100 MG tablet 1 tab by mouth once daily   spironolactone (ALDACTONE) 25 MG tablet Take 25 mg by mouth daily.   telmisartan (MICARDIS) 80 MG tablet Take 1 tablet (80 mg total) by mouth daily.   traMADol (ULTRAM) 50 MG tablet Take 1-2 tablets (50-100 mg total) by mouth every 6 (six) hours as needed for moderate pain or severe pain.   vitamin B-12 (CYANOCOBALAMIN) 1000 MCG tablet Take 1 tablet (1,000 mcg total) by mouth daily.   [DISCONTINUED] donepezil (ARICEPT) 10 MG tablet Take 1 tablet (10 mg total) by mouth at bedtime. (Patient taking differently: Take 10 mg by mouth at bedtime. (2000))   No facility-administered encounter medications on file as of 07/02/2023.       07/02/2023   12:00 PM 03/29/2018    4:28 PM  MMSE - Mini Mental State Exam  Orientation to time 4 5  Orientation to Place 4 5  Registration 3 3  Attention/ Calculation 5 4  Recall 1 1  Language- name 2 objects 2 2  Language- repeat 1 1  Language- follow 3 step command 3 3  Language- read & follow direction 1 1  Write a sentence 1 1  Copy design 1 1  Total score 26 27      03/30/2022   12:00 PM  Montreal Cognitive Assessment   Visuospatial/  Executive (0/5) 4  Naming (0/3) 3  Attention: Read list of digits (0/2) 1  Attention: Read list of letters (0/1) 1  Attention: Serial 7 subtraction starting at 100 (0/3) 0  Language: Repeat phrase (0/2) 0  Language : Fluency (0/1) 0  Abstraction (0/2) 2  Delayed Recall (0/5) 1  Orientation (0/6) 4  Total 16  Adjusted Score (based on education) 16    Objective:     PHYSICAL EXAMINATION:    VITALS:   Vitals:   07/02/23 0921  BP: 113/69  Pulse: 61  SpO2: 94%  Weight: 134 lb (60.8 kg)  Height: 5\' 4"  (1.626 m)    GEN:  The patient appears stated age and is in NAD. HEENT:  Normocephalic, atraumatic.   Neurological examination:  General: NAD, well-groomed, appears stated age. Orientation: The patient is alert. Oriented to person, place and year, not to date. Cranial nerves: There is good facial symmetry.The speech is fluent and clear. No aphasia or dysarthria. Fund of knowledge is appropriate. Recent memory impaired, remote memory normal.. Attention and concentration are reduced.  Able to name objects and repeat phrases.  Hearing is intact to conversational tone.  Sensation: Sensation is intact to light touch throughout Motor: Strength is at least antigravity x4. DTR's 2/4 in UE/LE     Movement examination: Tone: There is normal tone in the UE/LE Abnormal movements:  no tremor.  No myoclonus.  No asterixis.   Coordination:  There is no decremation with RAM's. Normal finger to nose  Gait and Station: The patient has no difficulty arising out of a deep-seated chair without the use of the hands. The patient's stride length is good.  Gait is cautious and narrow.    Thank you for allowing [place] the opportunity to participate in the care of this nice patient. Please do not hesitate to contact us for any questions or concerns.   Total time spent on today's visit was 32 minutes dedicated to this patient today, preparing to see patient, examining the patient, ordering tests and/or  medications and counseling the patient, documenting clinical information in the EHR or other health record, independently interpreting results and communicating results to the patient/family, discussing treatment and goals, answering patient's questions and coordinating care.  Cc:  Corwin Levins, MD  Marlowe Kays 07/02/2023 12:04 PM

## 2023-07-02 NOTE — Patient Instructions (Addendum)
It was a pleasure to see you today at our office.   Recommendations:  Continue donepezil 10 mg daily  Start Memantine 5 mg, take one tablet at night for 2 weeks and then increase to 5 mg twice a day  Follow up 6 months Recommend visiting the website : " Dementia Success [facility]" to better understand some behaviors related to memory loss   For guidance regarding [facility] and if placement were needed at the facility, contact Sidney Ace, Social Worker tel: [phone]  For assessment of decision of mental capacity and competency:  Call Dr. Erick Blinks, geriatric psychiatrist at [phone] Counseling regarding caregiver distress, including caregiver depression, anxiety and issues regarding community resources, adult day care programs, adult living facilities, or memory care questions:  please contact your  Primary Doctor's Social Worker  Whom to call: Memory  decline, memory medications: Call our office [phone]  For psychiatric meds, mood meds: Please have your primary care physician manage these medications.  If you have any severe symptoms of a stroke, or other severe issues such as confusion,severe chills or fever, etc call 911 or go to the ER as you may need to be evaluated further   [email or web address]   RECOMMENDATIONS FOR ALL PATIENTS WITH MEMORY PROBLEMS: 1. Continue to exercise (Recommend 30 minutes of walking everyday, or 3 hours every week) 2. Increase social interactions - continue going to [facility] and enjoy social gatherings with friends and family 3. Eat healthy, avoid fried foods and eat more fruits and vegetables 4. Maintain adequate blood pressure, blood sugar, and blood cholesterol level. Reducing the risk of stroke and cardiovascular disease also helps promoting better memory. 5. Avoid stressful situations. Live a simple life and avoid aggravations. Organize your time and prepare for the  next day in anticipation. 6. Sleep well, avoid any interruptions of sleep and avoid any distractions in the bedroom that may interfere with adequate sleep quality 7. Avoid sugar, avoid sweets as there is a strong link between excessive sugar intake, diabetes, and cognitive impairment We discussed the Mediterranean diet, which has been shown to help patients reduce the risk of progressive memory disorders and reduces cardiovascular risk. This includes eating fish, eat fruits and green leafy vegetables, nuts like almonds and hazelnuts, walnuts, and also use olive oil. Avoid fast foods and fried foods as much as possible. Avoid sweets and sugar as sugar use has been linked to worsening of memory function.  There is always a concern of gradual progression of memory problems. If this is the case, then we may need to adjust level of care according to patient needs. Support, both to the patient and caregiver, should then be put into place.    The Alzheimer's Association is here all day, every day for people facing Alzheimer's disease through our free 24/7 Helpline: [phone]. The Helpline provides reliable information and support to all those who need assistance, such as individuals living with memory loss, Alzheimer's or other dementia, caregivers, health care professionals and the public.  Our highly trained and knowledgeable staff can help you with: Understanding memory loss, dementia and Alzheimer's  Medications and other treatment options  General information about aging and brain health  Skills to provide quality care and to find the best care from professionals  Legal, financial and living-arrangement decisions Our Helpline also features: Confidential care consultation provided by master's level clinicians who can help with decision-making support, crisis assistance and education on issues families face every day  Help in a caller's  preferred language using our translation service that features  more than 200 languages and dialects  Referrals to local community programs, services and ongoing support     FALL PRECAUTIONS: Be cautious when walking. Scan the area for obstacles that may increase the risk of trips and falls. When getting up in the mornings, sit up at the edge of the bed for a few minutes before getting out of bed. Consider elevating the bed at the head end to avoid drop of blood pressure when getting up. Walk always in a well-lit room (use night lights in the walls). Avoid area rugs or power cords from appliances in the middle of the walkways. Use a walker or a cane if necessary and consider physical therapy for balance exercise. Get your eyesight checked regularly.  FINANCIAL OVERSIGHT: Supervision, especially oversight when making financial decisions or transactions is also recommended.  HOME SAFETY: Consider the safety of the kitchen when operating appliances like stoves, microwave oven, and blender. Consider having supervision and share cooking responsibilities until no longer able to participate in those. Accidents with firearms and other hazards in the house should be identified and addressed as well.   ABILITY TO BE LEFT ALONE: If patient is unable to contact 911 operator, consider using LifeLine, or when the need is there, arrange for someone to stay with patients. Smoking is a fire hazard, consider supervision or cessation. Risk of wandering should be assessed by caregiver and if detected at any point, supervision and safe proof recommendations should be instituted.  MEDICATION SUPERVISION: Inability to self-administer medication needs to be constantly addressed. Implement a mechanism to ensure safe administration of the medications.   DRIVING: Regarding driving, in patients with progressive memory problems, driving will be impaired. We advise to have someone else do the driving if trouble finding directions or if minor accidents are reported. Independent driving  assessment is available to determine safety of driving.   If you are interested in the driving assessment, you can contact the following:  [employer] in [place] [phone]   [facility] [phone] or [phone]      Mediterranean Diet A Mediterranean diet refers to food and lifestyle choices that are based on the traditions of countries located on the [employer]. This way of eating has been shown to help prevent certain conditions and improve outcomes for people who have chronic diseases, like kidney disease and heart disease. What are tips for following this plan? Lifestyle  Cook and eat meals together with your family, when possible. Drink enough fluid to keep your urine clear or pale yellow. Be physically active every day. This includes: Aerobic exercise like running or swimming. Leisure activities like gardening, walking, or housework. Get 7-8 hours of sleep each night. If recommended by your health care provider, drink red wine in moderation. This means 1 glass a day for nonpregnant women and 2 glasses a day for men. A glass of wine equals 5 oz (150 mL). Reading food labels  Check the serving size of packaged foods. For foods such as rice and pasta, the serving size refers to the amount of cooked product, not dry. Check the total fat in packaged foods. Avoid foods that have saturated fat or trans fats. Check the ingredients list for added sugars, such as corn syrup. Shopping  At the grocery store, buy most of your food from the areas near the walls of the store. This includes: Fresh fruits and vegetables (produce). Grains, beans, nuts,  and seeds. Some of these may be available in unpackaged forms or large amounts (in bulk). Fresh seafood. Poultry and eggs. Low-fat dairy products. Buy whole ingredients instead of prepackaged foods. Buy fresh fruits and vegetables in-season from local farmers markets. Buy  frozen fruits and vegetables in resealable bags. If you do not have access to quality fresh seafood, buy precooked frozen shrimp or canned fish, such as tuna, salmon, or sardines. Buy small amounts of raw or cooked vegetables, salads, or olives from the deli or salad bar at your store. Stock your pantry so you always have certain foods on hand, such as olive oil, canned tuna, canned tomatoes, rice, pasta, and beans. Cooking  Cook foods with extra-virgin olive oil instead of using butter or other vegetable oils. Have meat as a side dish, and have vegetables or grains as your main dish. This means having meat in small portions or adding small amounts of meat to foods like pasta or stew. Use beans or vegetables instead of meat in common dishes like chili or lasagna. Experiment with different cooking methods. Try roasting or broiling vegetables instead of steaming or sauteing them. Add frozen vegetables to soups, stews, pasta, or rice. Add nuts or seeds for added healthy fat at each meal. You can add these to yogurt, salads, or vegetable dishes. Marinate fish or vegetables using olive oil, lemon juice, garlic, and fresh herbs. Meal planning  Plan to eat 1 vegetarian meal one day each week. Try to work up to 2 vegetarian meals, if possible. Eat seafood 2 or more times a week. Have healthy snacks readily available, such as: Vegetable sticks with hummus. Greek yogurt. Fruit and nut trail mix. Eat balanced meals throughout the week. This includes: Fruit: 2-3 servings a day Vegetables: 4-5 servings a day Low-fat dairy: 2 servings a day Fish, poultry, or lean meat: 1 serving a day Beans and legumes: 2 or more servings a week Nuts and seeds: 1-2 servings a day Whole grains: 6-8 servings a day Extra-virgin olive oil: 3-4 servings a day Limit red meat and sweets to only a few servings a month What are my food choices? Mediterranean diet Recommended Grains: Whole-grain pasta. [place] rice. Bulgar  wheat. Polenta. Couscous. Whole-wheat bread. Orpah Cobb. Vegetables: Artichokes. Beets. Broccoli. Cabbage. Carrots. Eggplant. Green beans. Chard. Kale. Spinach. Onions. Leeks. Peas. Squash. Tomatoes. Peppers. Radishes. Fruits: Apples. Apricots. Avocado. Berries. Bananas. Cherries. Dates. Figs. Grapes. Lemons. Melon. Oranges. Peaches. Plums. Pomegranate. Meats and other protein foods: Beans. Almonds. Sunflower seeds. Pine nuts. Peanuts. Cod. Salmon. Scallops. Shrimp. Tuna. Tilapia. Clams. Oysters. Eggs. Dairy: Low-fat milk. Cheese. Greek yogurt. Beverages: Water. Red wine. Herbal tea. Fats and oils: Extra virgin olive oil. Avocado oil. Grape seed oil. Sweets and desserts: [place] yogurt with honey. Baked apples. Poached pears. Trail mix. Seasoning and other foods: Basil. Cilantro. Coriander. Cumin. Mint. Parsley. Sage. Rosemary. Tarragon. Garlic. Oregano. Thyme. Pepper. Balsalmic vinegar. Tahini. Hummus. Tomato sauce. Olives. Mushrooms. Limit these Grains: Prepackaged pasta or rice dishes. Prepackaged cereal with added sugar. Vegetables: Deep fried potatoes (french fries). Fruits: Fruit canned in syrup. Meats and other protein foods: Beef. Pork. Lamb. Poultry with skin. Hot dogs. Tomasa Blase. Dairy: Ice cream. Sour cream. Whole milk. Beverages: Juice. Sugar-sweetened soft drinks. Beer. Liquor and spirits. Fats and oils: Butter. Canola oil. Vegetable oil. Beef fat (tallow). Lard. Sweets and desserts: Cookies. Cakes. Pies. Candy. Seasoning and other foods: Mayonnaise. Premade sauces and marinades. The items listed may not be a complete list. Talk with your dietitian about what dietary  choices are right for you. Summary The Mediterranean diet includes both food and lifestyle choices. Eat a variety of fresh fruits and vegetables, beans, nuts, seeds, and whole grains. Limit the amount of red meat and sweets that you eat. Talk with your health care provider about whether it is safe for you to drink red  wine in moderation. This means 1 glass a day for nonpregnant women and 2 glasses a day for men. A glass of wine equals 5 oz (150 mL). This information is not intended to replace advice given to you by your health care provider. Make sure you discuss any questions you have with your health care provider. Document Released: 03/30/2016 Document Revised: 05/02/2016 Document Reviewed: 03/30/2016 Elsevier Interactive Patient Education  2017 [employer].

## 2023-07-12 ENCOUNTER — Ambulatory Visit: Payer: PPO | Admitting: Internal Medicine

## 2023-07-24 ENCOUNTER — Encounter: Payer: Self-pay | Admitting: Physician Assistant

## 2023-07-24 DIAGNOSIS — I10 Essential (primary) hypertension: Secondary | ICD-10-CM | POA: Diagnosis not present

## 2023-07-24 DIAGNOSIS — G309 Alzheimer's disease, unspecified: Secondary | ICD-10-CM | POA: Diagnosis not present

## 2023-07-24 DIAGNOSIS — G4700 Insomnia, unspecified: Secondary | ICD-10-CM | POA: Diagnosis not present

## 2023-07-24 DIAGNOSIS — F32A Depression, unspecified: Secondary | ICD-10-CM | POA: Diagnosis not present

## 2023-08-27 ENCOUNTER — Telehealth: Payer: Self-pay | Admitting: Physician Assistant

## 2023-08-27 NOTE — Telephone Encounter (Signed)
 Patient daughter needs to speak to someone about getting a letter to help handle things for mother

## 2023-10-17 ENCOUNTER — Emergency Department (HOSPITAL_COMMUNITY): Payer: Medicare HMO

## 2023-10-17 ENCOUNTER — Other Ambulatory Visit: Payer: Self-pay

## 2023-10-17 ENCOUNTER — Inpatient Hospital Stay (HOSPITAL_COMMUNITY)
Admission: EM | Admit: 2023-10-17 | Discharge: 2023-10-21 | DRG: 389 | Disposition: A | Discharge status: Home / Self Care | Payer: Medicare HMO | Attending: Internal Medicine | Admitting: Internal Medicine

## 2023-10-17 ENCOUNTER — Encounter (HOSPITAL_COMMUNITY): Payer: Self-pay

## 2023-10-17 DIAGNOSIS — I10 Essential (primary) hypertension: Secondary | ICD-10-CM | POA: Diagnosis not present

## 2023-10-17 DIAGNOSIS — F03A0 Unspecified dementia, mild, without behavioral disturbance, psychotic disturbance, mood disturbance, and anxiety: Secondary | ICD-10-CM | POA: Diagnosis present

## 2023-10-17 DIAGNOSIS — K219 Gastro-esophageal reflux disease without esophagitis: Secondary | ICD-10-CM | POA: Diagnosis present

## 2023-10-17 DIAGNOSIS — Z66 Do not resuscitate: Secondary | ICD-10-CM | POA: Diagnosis present

## 2023-10-17 DIAGNOSIS — K56609 Unspecified intestinal obstruction, unspecified as to partial versus complete obstruction: Principal | ICD-10-CM | POA: Diagnosis present

## 2023-10-17 DIAGNOSIS — I129 Hypertensive chronic kidney disease with stage 1 through stage 4 chronic kidney disease, or unspecified chronic kidney disease: Secondary | ICD-10-CM | POA: Diagnosis present

## 2023-10-17 DIAGNOSIS — Z1152 Encounter for screening for COVID-19: Secondary | ICD-10-CM | POA: Diagnosis not present

## 2023-10-17 DIAGNOSIS — M810 Age-related osteoporosis without current pathological fracture: Secondary | ICD-10-CM | POA: Diagnosis present

## 2023-10-17 DIAGNOSIS — Z79899 Other long term (current) drug therapy: Secondary | ICD-10-CM

## 2023-10-17 DIAGNOSIS — R112 Nausea with vomiting, unspecified: Secondary | ICD-10-CM | POA: Diagnosis not present

## 2023-10-17 DIAGNOSIS — E876 Hypokalemia: Secondary | ICD-10-CM | POA: Diagnosis present

## 2023-10-17 DIAGNOSIS — Z800 Family history of malignant neoplasm of digestive organs: Secondary | ICD-10-CM | POA: Diagnosis not present

## 2023-10-17 DIAGNOSIS — E039 Hypothyroidism, unspecified: Secondary | ICD-10-CM | POA: Diagnosis present

## 2023-10-17 DIAGNOSIS — Z87442 Personal history of urinary calculi: Secondary | ICD-10-CM

## 2023-10-17 DIAGNOSIS — N179 Acute kidney failure, unspecified: Secondary | ICD-10-CM | POA: Diagnosis present

## 2023-10-17 DIAGNOSIS — E785 Hyperlipidemia, unspecified: Secondary | ICD-10-CM | POA: Diagnosis present

## 2023-10-17 DIAGNOSIS — Z90710 Acquired absence of both cervix and uterus: Secondary | ICD-10-CM

## 2023-10-17 DIAGNOSIS — R1084 Generalized abdominal pain: Secondary | ICD-10-CM | POA: Diagnosis not present

## 2023-10-17 DIAGNOSIS — Z905 Acquired absence of kidney: Secondary | ICD-10-CM | POA: Diagnosis not present

## 2023-10-17 DIAGNOSIS — Z85828 Personal history of other malignant neoplasm of skin: Secondary | ICD-10-CM

## 2023-10-17 DIAGNOSIS — N1831 Chronic kidney disease, stage 3a: Secondary | ICD-10-CM | POA: Diagnosis present

## 2023-10-17 DIAGNOSIS — Z818 Family history of other mental and behavioral disorders: Secondary | ICD-10-CM | POA: Diagnosis not present

## 2023-10-17 DIAGNOSIS — Z882 Allergy status to sulfonamides status: Secondary | ICD-10-CM

## 2023-10-17 DIAGNOSIS — Z91013 Allergy to seafood: Secondary | ICD-10-CM | POA: Diagnosis not present

## 2023-10-17 DIAGNOSIS — Z881 Allergy status to other antibiotic agents status: Secondary | ICD-10-CM

## 2023-10-17 DIAGNOSIS — Z885 Allergy status to narcotic agent status: Secondary | ICD-10-CM | POA: Diagnosis not present

## 2023-10-17 DIAGNOSIS — Z823 Family history of stroke: Secondary | ICD-10-CM | POA: Diagnosis not present

## 2023-10-17 DIAGNOSIS — N189 Chronic kidney disease, unspecified: Secondary | ICD-10-CM | POA: Diagnosis not present

## 2023-10-17 LAB — COMPREHENSIVE METABOLIC PANEL
ALT: 15 U/L (ref 0–44)
AST: 18 U/L (ref 15–41)
Albumin: 4.2 g/dL (ref 3.5–5.0)
Alkaline Phosphatase: 47 U/L (ref 38–126)
Anion gap: 11 (ref 5–15)
BUN: 37 mg/dL — ABNORMAL HIGH (ref 8–23)
CO2: 21 mmol/L — ABNORMAL LOW (ref 22–32)
Calcium: 9.4 mg/dL (ref 8.9–10.3)
Chloride: 106 mmol/L (ref 98–111)
Creatinine, Ser: 1.82 mg/dL — ABNORMAL HIGH (ref 0.44–1.00)
GFR, Estimated: 27 mL/min — ABNORMAL LOW (ref >60)
Glucose, Bld: 134 mg/dL — ABNORMAL HIGH (ref 70–99)
Potassium: 4.3 mmol/L (ref 3.5–5.1)
Sodium: 138 mmol/L (ref 135–145)
Total Bilirubin: 1.2 mg/dL (ref 0.0–1.2)
Total Protein: 6.8 g/dL (ref 6.5–8.1)

## 2023-10-17 LAB — CBC
HCT: 42.2 % (ref 36.0–46.0)
Hemoglobin: 14 g/dL (ref 12.0–15.0)
MCH: 31 pg (ref 26.0–34.0)
MCHC: 33.2 g/dL (ref 30.0–36.0)
MCV: 93.4 fL (ref 80.0–100.0)
Platelets: 179 10*3/uL (ref 150–400)
RBC: 4.52 MIL/uL (ref 3.87–5.11)
RDW: 12.5 % (ref 11.5–15.5)
WBC: 9.1 10*3/uL (ref 4.0–10.5)
nRBC: 0 % (ref 0.0–0.2)

## 2023-10-17 LAB — LIPASE, BLOOD: Lipase: 23 U/L (ref 11–51)

## 2023-10-17 MED ORDER — SODIUM CHLORIDE 0.9 % IV BOLUS
500.0000 mL | Freq: Once | INTRAVENOUS | Status: AC
  Administered 2023-10-17: 500 mL via INTRAVENOUS

## 2023-10-17 NOTE — ED Provider Notes (Signed)
 [place] EMERGENCY DEPARTMENT AT [facility] Provider Note   CSN: [id number] Arrival date & time: 10/17/23  1646     History {Add pertinent medical, surgical, social history, OB history to HPI:1} Chief Complaint  Patient presents with  . Abdominal Pain  . Nausea  . Vomiting    Alyssa Fernandez is a 83 y.o. female.   Abdominal Pain      Home Medications Prior to Admission medications   Medication Sig Start Date End Date Taking? Authorizing Provider  acetaminophen (TYLENOL) 325 MG tablet Take 650 mg by mouth every 6 (six) hours as needed (pain).   Yes [provider]  amLODipine (NORVASC) 5 MG tablet Take 5 mg by mouth daily.   Yes [provider]  busPIRone (BUSPAR) 7.5 MG tablet Take 7.5 mg by mouth 2 (two) times daily.   Yes [provider]  diclofenac Sodium (VOLTAREN) 1 % GEL Apply 2 g topically 4 (four) times daily.   Yes [provider]  donepezil (ARICEPT) 10 MG tablet Take 1 tablet (10 mg total) by mouth daily. 07/02/23  Yes Marcos Eke, PA-C  ferrous sulfate 325 (65 FE) MG tablet Take 1 tablet (325 mg total) by mouth daily. 07/31/22 11/24/23 Yes Karie Soda, MD  FIBER PO Take 2 capsules by mouth 3 (three) times daily.   Yes [provider]  labetalol (NORMODYNE) 300 MG tablet Take 300 mg by mouth 2 (two) times daily.   Yes [provider]  loperamide (IMODIUM A-D) 2 MG tablet Take 2 mg by mouth See admin instructions. Take 1 tablet (2mg ) by mouth after each loose stool, as needed for diarrhea. Max of 3 tablets per day.   Yes [provider]  Melatonin 10 MG TABS Take 10 mg by mouth at bedtime as needed (sleep).   Yes [provider]  memantine (NAMENDA) 5 MG tablet Take 1 tablet (5 mg at night) for 2 weeks, then increase to 1 tablet (5 mg) twice a day Patient taking differently: Take 5 mg by mouth 2 (two) times daily. 07/02/23  Yes Wertman, Sung Amabile, PA-C  METAMUCIL FIBER PO Take 2  [place] by mouth daily as needed (constipation).   Yes [provider]  pantoprazole (PROTONIX) 40 MG tablet Take 40 mg by mouth daily.   Yes [provider]  potassium chloride (KLOR-CON) 10 MEQ tablet Take 10 mEq by mouth daily.   Yes [provider]  rosuvastatin (CRESTOR) 20 MG tablet Take 20 mg by mouth every evening.   Yes [provider]  sertraline (ZOLOFT) 100 MG tablet 1 tab by mouth once daily 03/13/22  Yes Corwin Levins, MD  spironolactone (ALDACTONE) 25 MG tablet Take 25 mg by mouth daily.   Yes [provider]  telmisartan (MICARDIS) 80 MG tablet Take 1 tablet (80 mg total) by mouth daily. 03/13/22  Yes Corwin Levins, MD  traZODone (DESYREL) 100 MG tablet Take 100 mg by mouth at bedtime as needed for sleep.   Yes [provider]  vitamin B-12 (CYANOCOBALAMIN) 1000 MCG tablet Take 1 tablet (1,000 mcg total) by mouth daily. 05/13/21  Yes Corwin Levins, MD      Allergies    Codeine, Shellfish allergy, Sulfonamide derivatives, and Zocor [simvastatin]    Review of Systems   Review of Systems  Gastrointestinal:  Positive for abdominal pain.    Physical Exam Updated Vital Signs BP 106/72   Pulse 88   Temp 99.7 F (37.6  C)   Resp 18   SpO2 96%  Physical Exam  ED Results / Procedures / Treatments   Labs (all labs ordered are listed, but only abnormal results are displayed) Labs Reviewed  COMPREHENSIVE METABOLIC PANEL - Abnormal; Notable for the following components:      Result Value   CO2 21 (*)    Glucose, Bld 134 (*)    BUN 37 (*)    Creatinine, Ser 1.82 (*)    GFR, Estimated 27 (*)    All other components within normal limits  RESP PANEL BY RT-PCR (RSV, FLU A&B, COVID)  RVPGX2  LIPASE, BLOOD  CBC  URINALYSIS, ROUTINE W REFLEX MICROSCOPIC  POC OCCULT BLOOD, ED    EKG None  Radiology CT ABDOMEN PELVIS WO CONTRAST Result Date: 10/17/2023 CLINICAL DATA:  Fever, diarrhea, abdominal pain EXAM: CT ABDOMEN AND  PELVIS WITHOUT CONTRAST TECHNIQUE: Multidetector CT imaging of the abdomen and pelvis was performed following the standard protocol without IV contrast. Unenhanced CT was performed per clinician order. Lack of IV contrast limits sensitivity and specificity, especially for evaluation of abdominal/pelvic solid viscera. RADIATION DOSE REDUCTION: This exam was performed according to the departmental dose-optimization program which includes automated exposure control, adjustment of the mA and/or kV according to patient size and/or use of iterative reconstruction technique. COMPARISON:  04/13/2017 FINDINGS: Lower chest: No acute pleural or parenchymal lung disease. Hepatobiliary: Numerous hepatic cysts are again noted. Otherwise unremarkable unenhanced appearance of the liver. Prior cholecystectomy. No biliary duct dilation. Pancreas: Unremarkable unenhanced appearance. Spleen: Unremarkable unenhanced appearance. Adrenals/Urinary Tract: Innumerable renal cysts are again identified consistent with polycystic kidney disease. No specific imaging follow-up is recommended. No urinary tract calculi or obstruction. The adrenals are stable. Bladder is decompressed, limiting its evaluation. Stomach/Bowel: Postsurgical changes from sigmoid colon resection and reanastomosis. There is diffuse small bowel dilatation measuring up to 2.5 cm in diameter, with scattered gas fluid levels. Exact transition point is not well visualized, but the distal jejunum and ileum within the lower pelvis are decompressed. Findings are consistent with small-bowel obstruction. No bowel wall thickening or inflammatory change. Small hiatal hernia. Vascular/Lymphatic: Aortic atherosclerosis. No enlarged abdominal or pelvic lymph nodes. Reproductive: Status post hysterectomy. No adnexal masses. Other: No free fluid or free intraperitoneal gas. No abdominal wall hernia. Musculoskeletal: No acute or destructive bony abnormalities. Chronic appearing compression  deformity within the superior endplate of the L4 vertebral body, with approximately 50% loss of height. Reconstructed images demonstrate no additional findings. IMPRESSION: 1. Diffuse small bowel dilatation and scattered gas fluid levels consistent with small-bowel obstruction. The exact transition point is not identified, but distal loops of jejunum and ileum are decompressed within the pelvis. Continued radiographic follow-up recommended. 2. Hepatic and renal cysts again noted, compatible with history of polycystic kidney disease. No specific imaging follow-up recommended. 3.  Aortic Atherosclerosis (ICD10-I70.0). 4. Hiatal hernia. Electronically Signed   By: Sharlet Salina M.D.   On: 10/17/2023 21:42    Procedures Procedures  {Document cardiac monitor, telemetry assessment procedure when appropriate:1}  Medications Ordered in ED Medications - No data to display  ED Course/ Medical Decision Making/ A&P   {   Click here for ABCD2, HEART and other calculatorsREFRESH Note before signing :1}                              Medical Decision Making Amount and/or Complexity of Data Reviewed Labs: ordered.    Alyssa Fernandez is  a 83 y.o. female         {Document critical care time when appropriate:1} {Document review of labs and clinical decision tools ie heart score, Chads2Vasc2 etc:1}  {Document your independent review of radiology images, and any outside records:1} {Document your discussion with family members, caretakers, and with consultants:1} {Document social determinants of health affecting pt's care:1} {Document your decision making why or why not admission, treatments were needed:1} Final Clinical Impression(s) / ED Diagnoses Final diagnoses:  None    Rx / DC Orders ED Discharge Orders     None

## 2023-10-17 NOTE — ED Provider Triage Note (Signed)
 Emergency Medicine Provider Triage Evaluation Note  Alyssa Fernandez , a 83 y.o. female  was evaluated in triage.  Pt complains of not feeling well the last few days.  She has had copious amounts of diarrhea.  Now having some changes in her stool which she thinks looks like blood.  She denies any anticoagulation.  She is also had some dysuria without hematuria.  She has had subjective fever at home.  Feels poorly.  Multiple episodes of NBNB emesis over the last few days.  Review of Systems  Positive: Nausea, vomiting, diarrhea, dysuria Negative:   Physical Exam  BP 106/72   Pulse 88   Temp 99.7 F (37.6 C)   Resp 18   SpO2 96%  Gen:   Awake, no distress   Resp:  Normal effort  MSK:   Moves extremities without difficulty  Other:    Medical Decision Making  Medically screening exam initiated at 6:26 PM.  Appropriate orders placed.  Alyssa Fernandez was informed that the remainder of the evaluation will be completed by another provider, this initial triage assessment does not replace that evaluation, and the importance of remaining in the ED until their evaluation is complete.  N/V/D    Yovanni Frenette A, PA-C 10/17/23 1827

## 2023-10-17 NOTE — ED Triage Notes (Addendum)
 Pt arrives via EMS from [place]. Pt reports abdominal pain, nausea, vomiting, and diarrhea since yesterday. Pt was given imodium with no relief. States pain worsens after eating. Hx of dementia but is currently AxOx4.

## 2023-10-18 ENCOUNTER — Encounter (HOSPITAL_COMMUNITY): Payer: Self-pay | Admitting: Internal Medicine

## 2023-10-18 ENCOUNTER — Inpatient Hospital Stay (HOSPITAL_COMMUNITY): Payer: Medicare HMO

## 2023-10-18 DIAGNOSIS — N189 Chronic kidney disease, unspecified: Secondary | ICD-10-CM

## 2023-10-18 DIAGNOSIS — N179 Acute kidney failure, unspecified: Secondary | ICD-10-CM | POA: Diagnosis not present

## 2023-10-18 DIAGNOSIS — K56609 Unspecified intestinal obstruction, unspecified as to partial versus complete obstruction: Secondary | ICD-10-CM | POA: Diagnosis not present

## 2023-10-18 DIAGNOSIS — I10 Essential (primary) hypertension: Secondary | ICD-10-CM | POA: Diagnosis not present

## 2023-10-18 LAB — BASIC METABOLIC PANEL
Anion gap: 15 (ref 5–15)
BUN: 35 mg/dL — ABNORMAL HIGH (ref 8–23)
CO2: 17 mmol/L — ABNORMAL LOW (ref 22–32)
Calcium: 8.1 mg/dL — ABNORMAL LOW (ref 8.9–10.3)
Chloride: 108 mmol/L (ref 98–111)
Creatinine, Ser: 1.56 mg/dL — ABNORMAL HIGH (ref 0.44–1.00)
GFR, Estimated: 33 mL/min — ABNORMAL LOW (ref >60)
Glucose, Bld: 149 mg/dL — ABNORMAL HIGH (ref 70–99)
Potassium: 3.3 mmol/L — ABNORMAL LOW (ref 3.5–5.1)
Sodium: 140 mmol/L (ref 135–145)

## 2023-10-18 LAB — CBC
HCT: 34.8 % — ABNORMAL LOW (ref 36.0–46.0)
Hemoglobin: 11.5 g/dL — ABNORMAL LOW (ref 12.0–15.0)
MCH: 31.3 pg (ref 26.0–34.0)
MCHC: 33 g/dL (ref 30.0–36.0)
MCV: 94.6 fL (ref 80.0–100.0)
Platelets: 137 10*3/uL — ABNORMAL LOW (ref 150–400)
RBC: 3.68 MIL/uL — ABNORMAL LOW (ref 3.87–5.11)
RDW: 12.7 % (ref 11.5–15.5)
WBC: 6.2 10*3/uL (ref 4.0–10.5)
nRBC: 0 % (ref 0.0–0.2)

## 2023-10-18 LAB — RESP PANEL BY RT-PCR (RSV, FLU A&B, COVID)  RVPGX2
Influenza A by PCR: NEGATIVE
Influenza B by PCR: NEGATIVE
Resp Syncytial Virus by PCR: NEGATIVE
SARS Coronavirus 2 by RT PCR: NEGATIVE

## 2023-10-18 MED ORDER — ROSUVASTATIN CALCIUM 20 MG PO TABS
20.0000 mg | ORAL_TABLET | Freq: Every evening | ORAL | Status: DC
  Administered 2023-10-19 – 2023-10-20 (×2): 20 mg via ORAL
  Filled 2023-10-18 (×4): qty 1

## 2023-10-18 MED ORDER — POTASSIUM CHLORIDE IN NACL 40-0.9 MEQ/L-% IV SOLN
INTRAVENOUS | Status: AC
Start: 2023-10-18 — End: 2023-10-19
  Filled 2023-10-18 (×2): qty 1000

## 2023-10-18 MED ORDER — LABETALOL HCL 300 MG PO TABS
300.0000 mg | ORAL_TABLET | Freq: Two times a day (BID) | ORAL | Status: DC
Start: 2023-10-18 — End: 2023-10-21
  Administered 2023-10-18 – 2023-10-20 (×5): 300 mg via ORAL
  Filled 2023-10-18 (×5): qty 1
  Filled 2023-10-18: qty 2
  Filled 2023-10-18: qty 1

## 2023-10-18 MED ORDER — MEMANTINE HCL 10 MG PO TABS
5.0000 mg | ORAL_TABLET | Freq: Two times a day (BID) | ORAL | Status: DC
  Administered 2023-10-18 – 2023-10-21 (×6): 5 mg via ORAL
  Filled 2023-10-18 (×7): qty 1

## 2023-10-18 MED ORDER — BUSPIRONE HCL 5 MG PO TABS
7.5000 mg | ORAL_TABLET | Freq: Two times a day (BID) | ORAL | Status: DC
  Administered 2023-10-18 – 2023-10-21 (×6): 7.5 mg via ORAL
  Filled 2023-10-18 (×4): qty 2
  Filled 2023-10-18: qty 1
  Filled 2023-10-18: qty 2
  Filled 2023-10-18: qty 1
  Filled 2023-10-18: qty 2

## 2023-10-18 MED ORDER — SERTRALINE HCL 100 MG PO TABS
100.0000 mg | ORAL_TABLET | Freq: Every day | ORAL | Status: DC
  Administered 2023-10-19 – 2023-10-21 (×2): 100 mg via ORAL
  Filled 2023-10-18 (×3): qty 1

## 2023-10-18 MED ORDER — DEXTROSE IN LACTATED RINGERS 5 % IV SOLN: INTRAVENOUS | Status: DC

## 2023-10-18 MED ORDER — DIATRIZOATE MEGLUMINE & SODIUM 66-10 % PO SOLN
90.0000 mL | Freq: Once | ORAL | Status: AC
  Administered 2023-10-18: 90 mL via ORAL
  Filled 2023-10-18: qty 90

## 2023-10-18 MED ORDER — DIATRIZOATE MEGLUMINE & SODIUM 66-10 % PO SOLN: 90.0000 mL | Freq: Once | ORAL | Status: DC

## 2023-10-18 MED ORDER — DONEPEZIL HCL 10 MG PO TABS
10.0000 mg | ORAL_TABLET | Freq: Every day | ORAL | Status: DC
  Administered 2023-10-19 – 2023-10-21 (×3): 10 mg via ORAL
  Filled 2023-10-18 (×3): qty 1

## 2023-10-18 MED ORDER — HYDRALAZINE HCL 20 MG/ML IJ SOLN: 5.0000 mg | INTRAMUSCULAR | Status: DC | PRN

## 2023-10-18 MED ORDER — AMLODIPINE BESYLATE 5 MG PO TABS: 5.0000 mg | ORAL_TABLET | Freq: Every day | ORAL | Status: DC

## 2023-10-18 NOTE — Progress Notes (Signed)
 [facility] PROGRESS NOTE    Progress Note  Alyssa Fernandez  KVQ:[id number] DOB: April 06, 1941 DOA: 10/17/2023 PCP: Corwin Levins, MD     Brief Narrative:   Alyssa Fernandez is an 83 y.o. female past medical history significant for essential hypertension, chronic kidney see stage III, dementia hyperlipidemia has been having abdominal pain for about 5 days prior to admission.  Which have progressively gotten worse CT scan of the abdomen and pelvis shows small bowel obstruction General Surgery was consulted.  Assessment/Plan:   SBO (small bowel obstruction) (HCC) SBO protocol has been ordered.. If he develops nausea and vomiting can proceed with placing NG tube. Keep NPO. Appreciate general surgery's further management per general surgery. Check potassium greater than 4 magnesium greater than 2.  Essential hypertension: Continue labetalol and amlodipine. Hold ARB and Aldactone due to acute kidney injury.  Acute kidney injury on chronic disease stage IIIa: Baseline creatinine around 1.3. On admission 1.8. Aldactone and ARB were held, was started on IV fluids and creatinine is improving nicely.  Hypokalemia: Replete IV recheck in the morning  Hypothyroidism Continue Synthroid.  History of dementia: While he is n.p.o. we will hold Aricept and Namenda.  History of depression: Continue Zoloft and trazodone, once he is able to take orals.  Hyperlipidemia Hold statins for now.    DVT prophylaxis: lovenox Family Communication:none Status is: Inpatient Remains inpatient appropriate because: Small bowel obstruction    Code Status:     Code Status Orders  (From admission, onward)           Start     Ordered   10/18/23 0044  Do not attempt resuscitation (DNR)- Limited -Do Not Intubate (DNI)  Continuous       Question Answer Comment  If pulseless and not breathing No CPR or chest compressions.   In Pre-Arrest Conditions (Patient Is Breathing and Has A Pulse) Do  not intubate. Provide all appropriate non-invasive medical interventions. Avoid ICU transfer unless indicated or required.   Consent: Discussion documented in EHR or advanced directives reviewed      10/18/23 0045           Code Status History     Date Active Date Inactive Code Status Order ID Comments User Context   07/26/2022 1412 07/31/2022 1647 Full Code [id number]  Karie Soda, MD Inpatient   04/13/2017 1438 04/16/2017 1817 Full Code [id number]  Elwyn Re[facility] ED   05/06/2012 1813 05/10/2012 1840 Full Code [id number]  Rodolph Bong, MD Inpatient      Advance Directive Documentation    Flowsheet Row Most Recent Value  Type of Advance Directive Out of facility DNR (pink MOST or yellow form)  Pre-existing out of facility DNR order (yellow form or pink MOST form) --  "MOST" Form in Place? --         IV Access:   Peripheral IV   Procedures and diagnostic studies:   CT ABDOMEN PELVIS WO CONTRAST Result Date: 10/17/2023 CLINICAL DATA:  Fever, diarrhea, abdominal pain EXAM: CT ABDOMEN AND PELVIS WITHOUT CONTRAST TECHNIQUE: Multidetector CT imaging of the abdomen and pelvis was performed following the standard protocol without IV contrast. Unenhanced CT was performed per clinician order. Lack of IV contrast limits sensitivity and specificity, especially for evaluation of abdominal/pelvic solid viscera. RADIATION DOSE REDUCTION: This exam was performed according to the departmental dose-optimization program which includes automated exposure control, adjustment of the mA and/or kV according to patient size and/or use of iterative reconstruction technique.  COMPARISON:  04/13/2017 FINDINGS: Lower chest: No acute pleural or parenchymal lung disease. Hepatobiliary: Numerous hepatic cysts are again noted. Otherwise unremarkable unenhanced appearance of the liver. Prior cholecystectomy. No biliary duct dilation. Pancreas: Unremarkable unenhanced appearance. Spleen: Unremarkable  unenhanced appearance. Adrenals/Urinary Tract: Innumerable renal cysts are again identified consistent with polycystic kidney disease. No specific imaging follow-up is recommended. No urinary tract calculi or obstruction. The adrenals are stable. Bladder is decompressed, limiting its evaluation. Stomach/Bowel: Postsurgical changes from sigmoid colon resection and reanastomosis. There is diffuse small bowel dilatation measuring up to 2.5 cm in diameter, with scattered gas fluid levels. Exact transition point is not well visualized, but the distal jejunum and ileum within the lower pelvis are decompressed. Findings are consistent with small-bowel obstruction. No bowel wall thickening or inflammatory change. Small hiatal hernia. Vascular/Lymphatic: Aortic atherosclerosis. No enlarged abdominal or pelvic lymph nodes. Reproductive: Status post hysterectomy. No adnexal masses. Other: No free fluid or free intraperitoneal gas. No abdominal wall hernia. Musculoskeletal: No acute or destructive bony abnormalities. Chronic appearing compression deformity within the superior endplate of the L4 vertebral body, with approximately 50% loss of height. Reconstructed images demonstrate no additional findings. IMPRESSION: 1. Diffuse small bowel dilatation and scattered gas fluid levels consistent with small-bowel obstruction. The exact transition point is not identified, but distal loops of jejunum and ileum are decompressed within the pelvis. Continued radiographic follow-up recommended. 2. Hepatic and renal cysts again noted, compatible with history of polycystic kidney disease. No specific imaging follow-up recommended. 3.  Aortic Atherosclerosis (ICD10-I70.0). 4. Hiatal hernia. Electronically Signed   By: Sharlet Salina M.D.   On: 10/17/2023 21:42     Medical Consultants:   None.   Subjective:    Alyssa Fernandez she denies her abdominal pain is about the same.  Objective:    Vitals:   10/18/23 0400 10/18/23 0448  10/18/23 0500 10/18/23 0600  BP: 111/64  92/67 (!) 87/57  Pulse: 77  71 75  Resp: 20  18 (!) 21  Temp:  98.8 F (37.1 C)    TempSrc:  Oral    SpO2: (!) 89%  93% 96%   SpO2: 96 %   Intake/Output Summary (Last 24 hours) at 10/18/2023 2130 Last data filed at 10/17/2023 2342 Gross per 24 hour  Intake 500 ml  Output --  Net 500 ml   There were no vitals filed for this visit.  Exam: General exam: In no acute distress. Respiratory system: Good air movement and clear to auscultation. Cardiovascular system: S1 & S2 heard, RRR. No JVD. Gastrointestinal system: Abdomen is nondistended, soft and nontender.  Extremities: No pedal edema. Skin: No rashes, lesions or ulcers Psychiatry: Judgement and insight appear normal. Mood & affect appropriate.    Data Reviewed:    Labs: Basic Metabolic Panel: Recent Labs  Lab 10/17/23 1732 10/18/23 0443  NA 138 140  K 4.3 3.3*  CL 106 108  CO2 21* 17*  GLUCOSE 134* 149*  BUN 37* 35*  CREATININE 1.82* 1.56*  CALCIUM 9.4 8.1*   GFR CrCl cannot be calculated (Unknown ideal weight.). Liver Function Tests: Recent Labs  Lab 10/17/23 1732  AST 18  ALT 15  ALKPHOS 47  BILITOT 1.2  PROT 6.8  ALBUMIN 4.2   Recent Labs  Lab 10/17/23 1732  LIPASE 23   No results for input(s): "AMMONIA" in the last 168 hours. Coagulation profile No results for input(s): "INR", "PROTIME" in the last 168 hours. COVID-19 Labs  No results for input(s): "DDIMER", "FERRITIN", "  LDH", "CRP" in the last 72 hours.  Lab Results  Component Value Date   SARSCOV2NAA NEGATIVE 10/17/2023    CBC: Recent Labs  Lab 10/17/23 1732 10/18/23 0443  WBC 9.1 6.2  HGB 14.0 11.5*  HCT 42.2 34.8*  MCV 93.4 94.6  PLT 179 137*   Cardiac Enzymes: No results for input(s): "CKTOTAL", "CKMB", "CKMBINDEX", "TROPONINI" in the last 168 hours. BNP (last 3 results) No results for input(s): "PROBNP" in the last 8760 hours. CBG: No results for input(s): "GLUCAP" in the  last 168 hours. D-Dimer: No results for input(s): "DDIMER" in the last 72 hours. Hgb A1c: No results for input(s): "HGBA1C" in the last 72 hours. Lipid Profile: No results for input(s): "CHOL", "HDL", "LDLCALC", "TRIG", "CHOLHDL", "LDLDIRECT" in the last 72 hours. Thyroid function studies: No results for input(s): "TSH", "T4TOTAL", "T3FREE", "THYROIDAB" in the last 72 hours.  Invalid input(s): "FREET3" Anemia work up: No results for input(s): "VITAMINB12", "FOLATE", "FERRITIN", "TIBC", "IRON", "RETICCTPCT" in the last 72 hours. Sepsis Labs: Recent Labs  Lab 10/17/23 1732 10/18/23 0443  WBC 9.1 6.2   Microbiology Recent Results (from the past 240 hours)  Resp panel by RT-PCR (RSV, Flu A&B, Covid) Anterior Nasal Swab     Status: None   Collection Time: 10/17/23 10:41 PM   Specimen: Anterior Nasal Swab  Result Value Ref Range Status   SARS Coronavirus 2 by RT PCR NEGATIVE NEGATIVE Final   Influenza A by PCR NEGATIVE NEGATIVE Final   Influenza B by PCR NEGATIVE NEGATIVE Final    Comment: (NOTE) The Xpert Xpress SARS-CoV-2/FLU/RSV plus assay is intended as an aid in the diagnosis of influenza from Nasopharyngeal swab specimens and should not be used as a sole basis for treatment. Nasal washings and aspirates are unacceptable for Xpert Xpress SARS-CoV-2/FLU/RSV testing.  Fact Sheet for Patients: BloggerCourse.com  Fact Sheet for Healthcare Providers: SeriousBroker.it  This test is not yet approved or cleared by the [place] FDA and has been authorized for detection and/or diagnosis of SARS-CoV-2 by FDA under an Emergency Use Authorization (EUA). This EUA will remain in effect (meaning this test can be used) for the duration of the COVID-19 declaration under Section 564(b)(1) of the Act, 21 U.S.C. section 360bbb-3(b)(1), unless the authorization is terminated or revoked.     Resp Syncytial Virus by PCR NEGATIVE NEGATIVE  Final    Comment: (NOTE) Fact Sheet for Patients: BloggerCourse.com  Fact Sheet for Healthcare Providers: SeriousBroker.it  This test is not yet approved or cleared by the [place] FDA and has been authorized for detection and/or diagnosis of SARS-CoV-2 by FDA under an Emergency Use Authorization (EUA). This EUA will remain in effect (meaning this test can be used) for the duration of the COVID-19 declaration under Section 564(b)(1) of the Act, 21 U.S.C. section 360bbb-3(b)(1), unless the authorization is terminated or revoked.  Performed at [facility], [address], [place], [place] [address]      Medications:    amLODipine  5 mg Oral Daily   busPIRone  7.5 mg Oral BID   donepezil  10 mg Oral Daily   labetalol  300 mg Oral BID   memantine  5 mg Oral BID   rosuvastatin  20 mg Oral QPM   sertraline  100 mg Oral Daily   Continuous Infusions:  dextrose 5% lactated ringers 75 mL/hr at 10/18/23 0218      LOS: 1 day   Marinda Elk  [facility]  10/18/2023, 6:43 AM

## 2023-10-18 NOTE — H&P (Signed)
 History and Physical    Alyssa Fernandez:[id number] DOB: 18-Jan-1941 DOA: 10/17/2023  Patient coming from: Assisted living facility.  Chief Complaint: Abdominal pain.  HPI: Alyssa Fernandez is a 83 y.o. female with history of hypertension, chronic kidney disease stage III, dementia, depression, GERD, hyperlipidemia has been experiencing abdominal discomfort mostly in the epigastric area with frequent burping and nausea for the last 5 days which has progressively worsened and patient decided to come to the ER.  Patient states whenever she tries to eat she has this nauseated feeling with frequent burping but denies any vomiting.  Patient's last bowel movement was 24 hours ago.  ED Course: In the ER CT abdomen pelvis shows features concerning for small bowel obstruction and general surgery was consulted.  Labs show increased creatinine from baseline of around 1.38 is 1.8 today.  Review of Systems: As per HPI, rest all negative.   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   Severe by CT April 2012   Degenerative joint disease 05/26/2014   Dehydration 05/06/2012   Dermatitis 01/20/2010   Dyspnea 02/03/2014   Easy bruising 12/02/2015   Epigastric pain 12/14/2021   Essential hypertension 02/24/2010   Fatigue 11/25/2014   Generalized anxiety disorder 11/16/2011   GERD    Hepatic abscess 05/03/2012   History of kidney stones    History of skin cancer    leg   Hives 04/08/2014   Hyperlipidemia    Hypokalemia 05/06/2012   Hypothyroidism 02/12/2008   Insomnia    Internal hemorrhoids 03/22/2022   Irritable bowel  syndrome with diarrhea 10/16/2022   Liver abscess 06/19/2012   Low blood pressure 06/19/2012   Lumbar radiculopathy 05/27/2021   Major depressive disorder 12/14/2021   Menopausal and postmenopausal disorder 03/09/2009   Mild dementia, concerns for Alzheimer's disease 12/12/2022   Normocytic anemia 05/07/2012   Osteopenia    Osteoporosis 01/30/2018   Penetrating forearm wound, left 05/16/2020   Peripheral neuropathy 02/05/2013   Polycystic kidney 02/12/2008   Pruritus ani 08/17/2022   Rash 11/20/2011   Rectal prolapse s/p robotic LAR/rectopexy 07/26/2022   Right wrist pain 11/09/2019   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   Tachycardia, unspecified 10/05/2009   UTI (urinary tract infection) 03/22/2022   Vaginal itching 06/26/2012   Vitamin D deficiency 11/10/2009    Past Surgical History:  Procedure Laterality Date   ABDOMINAL HYSTERECTOMY     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     bladder operation, but per sling procedure     BUNIONECTOMY     CERVICAL DISC SURGERY     fx rt ankle     INGUINAL HERNIA REPAIR Left 10/26/2022   Procedure: LAPAROSCOPIC BILATERAL SPIGELIAN HERNIA, BILATERAL INGUINAL HERNIA  AND LEFT FEMORAL HERNIA REPAIR WITH MESH AND TAP BLOCK;  Surgeon: Karie Soda, MD;  Location: [facility];  Service: General;  Laterality: Left;  120 ROOM 5   left temporal bx artery-negative 2      PARTIAL NEPHRECTOMY Left    percutaneous drainage of liver abscess  05/06/2012   PROCTOSCOPY N/A 07/26/2022   Procedure: RIGID PROCTOSCOPY;  Surgeon: Karie Soda, MD;  Location:  [facility];  Service: General;  Laterality: N/A;   RECTOPEXY N/A 07/26/2022   Procedure: RECTOPEXY;  Surgeon: Karie Soda, MD;  Location: [facility];  Service: General;  Laterality: N/A;     reports that she has never smoked. She has never used smokeless tobacco. She reports that she does not drink alcohol and does not use drugs.  Allergies  Allergen Reactions   Codeine Swelling   Shellfish Allergy Swelling    Throat swells    Sulfonamide Derivatives Swelling   Zocor [Simvastatin] Other (See Comments)    Cognitive decrease    Family History  Problem Relation Age of Onset   Stroke Mother    Stomach cancer Sister    Bipolar disorder Brother    Memory loss Other        grandparent   Colon cancer Neg Hx    Colon polyps Neg Hx    Esophageal cancer Neg Hx    Rectal cancer Neg Hx     Prior to Admission medications   Medication Sig Start Date End Date Taking? Authorizing Provider  acetaminophen (TYLENOL) 325 MG tablet Take 650 mg by mouth every 6 (six) hours as needed (pain).   Yes [provider]  amLODipine (NORVASC) 5 MG tablet Take 5 mg by mouth daily.   Yes [provider]  busPIRone (BUSPAR) 7.5 MG tablet Take 7.5 mg by mouth 2 (two) times daily.   Yes [provider]  diclofenac Sodium (VOLTAREN) 1 % GEL Apply 2 g topically 4 (four) times daily.   Yes [provider]  donepezil (ARICEPT) 10 MG tablet Take 1 tablet (10 mg total) by mouth daily. 07/02/23  Yes Marcos Eke, PA-C  ferrous sulfate 325 (65 FE) MG tablet Take 1 tablet (325 mg total) by mouth daily. 07/31/22 11/24/23 Yes Karie Soda, MD  FIBER PO Take 2 capsules by mouth 3 (three) times daily.   Yes [provider]  labetalol (NORMODYNE) 300 MG tablet Take 300 mg by mouth 2 (two) times daily.   Yes [provider]  loperamide (IMODIUM A-D) 2 MG tablet Take 2 mg by mouth See admin instructions. Take 1 tablet (2mg ) by mouth after each loose stool, as needed for diarrhea. Max of 3 tablets per day.   Yes [provider]  Melatonin 10 MG TABS Take 10 mg by mouth at bedtime as needed (sleep).   Yes [provider]  memantine (NAMENDA) 5 MG tablet Take 1 tablet (5 mg at night) for 2 weeks, then increase to 1 tablet (5 mg) twice a day Patient taking differently: Take 5 mg by mouth 2 (two) times daily. 07/02/23  Yes Wertman, Sung Amabile, PA-C  METAMUCIL FIBER PO Take 2 Scoops by mouth  daily as needed (constipation).   Yes [provider]  pantoprazole (PROTONIX) 40 MG tablet Take 40 mg by mouth daily.   Yes [provider]  potassium chloride (KLOR-CON) 10 MEQ tablet Take 10 mEq by mouth daily.   Yes [provider]  rosuvastatin (CRESTOR) 20 MG tablet Take 20 mg by mouth every evening.   Yes [provider]  sertraline (ZOLOFT) 100 MG tablet 1 tab by mouth once daily 03/13/22  Yes Corwin Levins, MD  spironolactone (ALDACTONE) 25 MG tablet Take 25 mg by mouth daily.   Yes [provider]  telmisartan (MICARDIS) 80 MG tablet Take 1 tablet (80 mg total) by mouth daily. 03/13/22  Yes Corwin Levins, MD  traZODone (DESYREL) 100 MG  tablet Take 100 mg by mouth at bedtime as needed for sleep.   Yes [provider]  vitamin B-12 (CYANOCOBALAMIN) 1000 MCG tablet Take 1 tablet (1,000 mcg total) by mouth daily. 05/13/21  Yes Corwin Levins, MD    Physical Exam: Constitutional: Moderately built and nourished. Vitals:   10/17/23 1647 10/17/23 1700 10/17/23 2245 10/18/23 0022  BP:  106/72 (!) 143/70   Pulse:  88 85   Resp:  18 20   Temp:  99.7 F (37.6 C)  98.4 F (36.9 C)  TempSrc:    Oral  SpO2: 94% 96% 90%    Eyes: Anicteric no pallor. ENMT: No discharge from the ears eyes nose or mouth. Neck: No mass felt.  No neck rigidity. Respiratory: No rhonchi or crepitations. Cardiovascular: S1-S2 heard. Abdomen: Mildly distended nontender bowel sounds not appreciated. Musculoskeletal: No edema. Skin: No rash. Neurologic: Alert awake oriented to time place and person.  Moves all extremities. Psychiatric: Appear normal.  Normal affect.   Labs on Admission: I have personally reviewed following labs and imaging studies  CBC: Recent Labs  Lab 10/17/23 1732  WBC 9.1  HGB 14.0  HCT 42.2  MCV 93.4  PLT 179   Basic Metabolic Panel: Recent Labs  Lab 10/17/23 1732  NA 138  K 4.3  CL 106  CO2 21*  GLUCOSE 134*  BUN 37*   CREATININE 1.82*  CALCIUM 9.4   GFR: CrCl cannot be calculated (Unknown ideal weight.). Liver Function Tests: Recent Labs  Lab 10/17/23 1732  AST 18  ALT 15  ALKPHOS 47  BILITOT 1.2  PROT 6.8  ALBUMIN 4.2   Recent Labs  Lab 10/17/23 1732  LIPASE 23   No results for input(s): "AMMONIA" in the last 168 hours. Coagulation Profile: No results for input(s): "INR", "PROTIME" in the last 168 hours. Cardiac Enzymes: No results for input(s): "CKTOTAL", "CKMB", "CKMBINDEX", "TROPONINI" in the last 168 hours. BNP (last 3 results) No results for input(s): "PROBNP" in the last 8760 hours. HbA1C: No results for input(s): "HGBA1C" in the last 72 hours. CBG: No results for input(s): "GLUCAP" in the last 168 hours. Lipid Profile: No results for input(s): "CHOL", "HDL", "LDLCALC", "TRIG", "CHOLHDL", "LDLDIRECT" in the last 72 hours. Thyroid Function Tests: No results for input(s): "TSH", "T4TOTAL", "FREET4", "T3FREE", "THYROIDAB" in the last 72 hours. Anemia Panel: No results for input(s): "VITAMINB12", "FOLATE", "FERRITIN", "TIBC", "IRON", "RETICCTPCT" in the last 72 hours. Urine analysis:    Component Value Date/Time   COLORURINE YELLOW 04/18/2023 1231   APPEARANCEUR CLEAR 04/18/2023 1231   LABSPEC 1.020 04/18/2023 1231   PHURINE 6.0 04/18/2023 1231   GLUCOSEU NEGATIVE 04/18/2023 1231   HGBUR NEGATIVE 04/18/2023 1231   HGBUR negative 01/31/2010 1644   BILIRUBINUR NEGATIVE 04/18/2023 1231   BILIRUBINUR n 04/12/2011 1413   KETONESUR NEGATIVE 04/18/2023 1231   PROTEINUR 100 (A) 12/22/2019 1014   UROBILINOGEN 0.2 04/18/2023 1231   NITRITE NEGATIVE 04/18/2023 1231   LEUKOCYTESUR NEGATIVE 04/18/2023 1231   Sepsis Labs: @LABRCNTIP (procalcitonin:4,lacticidven:4) ) Recent Results (from the past 240 hours)  Resp panel by RT-PCR (RSV, Flu A&B, Covid) Anterior Nasal Swab     Status: None   Collection Time: 10/17/23 10:41 PM   Specimen: Anterior Nasal Swab  Result Value Ref Range  Status   SARS Coronavirus 2 by RT PCR NEGATIVE NEGATIVE Final   Influenza A by PCR NEGATIVE NEGATIVE Final   Influenza B by PCR NEGATIVE NEGATIVE Final    Comment: (NOTE) The Xpert Xpress SARS-CoV-2/FLU/RSV  plus assay is intended as an aid in the diagnosis of influenza from Nasopharyngeal swab specimens and should not be used as a sole basis for treatment. Nasal washings and aspirates are unacceptable for Xpert Xpress SARS-CoV-2/FLU/RSV testing.  Fact Sheet for Patients: BloggerCourse.com  Fact Sheet for Healthcare Providers: SeriousBroker.it  This test is not yet approved or cleared by the [place] FDA and has been authorized for detection and/or diagnosis of SARS-CoV-2 by FDA under an Emergency Use Authorization (EUA). This EUA will remain in effect (meaning this test can be used) for the duration of the COVID-19 declaration under Section 564(b)(1) of the Act, 21 U.S.C. section 360bbb-3(b)(1), unless the authorization is terminated or revoked.     Resp Syncytial Virus by PCR NEGATIVE NEGATIVE Final    Comment: (NOTE) Fact Sheet for Patients: BloggerCourse.com  Fact Sheet for Healthcare Providers: SeriousBroker.it  This test is not yet approved or cleared by the [place] FDA and has been authorized for detection and/or diagnosis of SARS-CoV-2 by FDA under an Emergency Use Authorization (EUA). This EUA will remain in effect (meaning this test can be used) for the duration of the COVID-19 declaration under Section 564(b)(1) of the Act, 21 U.S.C. section 360bbb-3(b)(1), unless the authorization is terminated or revoked.  Performed at [facility], [address] [place], [place] [address]      Radiological Exams on Admission: CT ABDOMEN PELVIS WO CONTRAST Result Date: 10/17/2023 CLINICAL DATA:  Fever, diarrhea, abdominal pain EXAM: CT ABDOMEN AND PELVIS  WITHOUT CONTRAST TECHNIQUE: Multidetector CT imaging of the abdomen and pelvis was performed following the standard protocol without IV contrast. Unenhanced CT was performed per clinician order. Lack of IV contrast limits sensitivity and specificity, especially for evaluation of abdominal/pelvic solid viscera. RADIATION DOSE REDUCTION: This exam was performed according to the departmental dose-optimization program which includes automated exposure control, adjustment of the mA and/or kV according to patient size and/or use of iterative reconstruction technique. COMPARISON:  04/13/2017 FINDINGS: Lower chest: No acute pleural or parenchymal lung disease. Hepatobiliary: Numerous hepatic cysts are again noted. Otherwise unremarkable unenhanced appearance of the liver. Prior cholecystectomy. No biliary duct dilation. Pancreas: Unremarkable unenhanced appearance. Spleen: Unremarkable unenhanced appearance. Adrenals/Urinary Tract: Innumerable renal cysts are again identified consistent with polycystic kidney disease. No specific imaging follow-up is recommended. No urinary tract calculi or obstruction. The adrenals are stable. Bladder is decompressed, limiting its evaluation. Stomach/Bowel: Postsurgical changes from sigmoid colon resection and reanastomosis. There is diffuse small bowel dilatation measuring up to 2.5 cm in diameter, with scattered gas fluid levels. Exact transition point is not well visualized, but the distal jejunum and ileum within the lower pelvis are decompressed. Findings are consistent with small-bowel obstruction. No bowel wall thickening or inflammatory change. Small hiatal hernia. Vascular/Lymphatic: Aortic atherosclerosis. No enlarged abdominal or pelvic lymph nodes. Reproductive: Status post hysterectomy. No adnexal masses. Other: No free fluid or free intraperitoneal gas. No abdominal wall hernia. Musculoskeletal: No acute or destructive bony abnormalities. Chronic appearing compression  deformity within the superior endplate of the L4 vertebral body, with approximately 50% loss of height. Reconstructed images demonstrate no additional findings. IMPRESSION: 1. Diffuse small bowel dilatation and scattered gas fluid levels consistent with small-bowel obstruction. The exact transition point is not identified, but distal loops of jejunum and ileum are decompressed within the pelvis. Continued radiographic follow-up recommended. 2. Hepatic and renal cysts again noted, compatible with history of polycystic kidney disease. No specific imaging follow-up recommended. 3.  Aortic Atherosclerosis (ICD10-I70.0). 4. Hiatal  hernia. Electronically Signed   By: Sharlet Salina M.D.   On: 10/17/2023 21:42    EKG: Independently reviewed.  Normal sinus rhythm.  Sinus bigeminy.  Assessment/Plan Principal Problem:   SBO (small bowel obstruction) (HCC) Active Problems:   Hypothyroidism   Hyperlipidemia   Essential hypertension   GERD   Mild dementia, concerns for Alzheimer's disease   Renal failure (ARF), acute on chronic (HCC)    Small bowel obstruction -     appreciate general surgery consult.  Since patient is not vomiting at this time general surgery is planning to keep patient on clear liquid diet.  If patient vomits will need NG tube.  Further recommendation per general surgery. Hypertension continue labetalol and amlodipine.  Hold ARB and spironolactone due to acute renal failure. Acute on chronic kidney disease stage III baseline creatinine is around 1.3 it is around 1.8 likely from poor oral intake from bowel obstruction.  Gently hydrate.  Follow metabolic panel.  Hold ARB and spironolactone for now. History of dementia on Aricept and Namenda. History of GERD on PPI. History of depression on Zoloft and trazodone.  Since patient has bowel obstruction with renal failure will need close monitoring and more than 2 midnight stay in inpatient status.   DVT prophylaxis: SCDs. Code Status:  DNR. Family Communication: Discussed with patient. Disposition Plan: Medical floor. Consults called: General Surgery. Admission status: Inpatient.

## 2023-10-18 NOTE — Plan of Care (Signed)
  Problem: Education: Goal: Knowledge of General Education information will improve Description: Including pain rating scale, medication(s)/side effects and non-pharmacologic comfort measures Outcome: Progressing   Problem: Health Behavior/Discharge Planning: Goal: Ability to manage health-related needs will improve Outcome: Progressing   Problem: Clinical Measurements: Goal: Ability to maintain clinical measurements within normal limits will improve Outcome: Progressing Goal: Cardiovascular complication will be avoided Outcome: Progressing   Problem: Coping: Goal: Level of anxiety will decrease Outcome: Progressing   Problem: Pain Managment: Goal: General experience of comfort will improve and/or be controlled Outcome: Completed/Met

## 2023-10-18 NOTE — Consult Note (Addendum)
 Reason for Consult/Chief Complaint:  SBO Referring Provider: Tegeler, MD  HPI  Alyssa Fernandez is an 83 y.o. female with history of HTN, HLD, kidney stones, hemorrhoids, polycystic kidneys, bilateral laparoscopic inguinal, spigelian hernia, and left femoral hernia repair, previous rectal prolapse status post rectopexy, previous hysterectomy, and previous hepatic abscess who presents with nausea, vomiting, abdominal pain.  Patient states that she has been having decreased p.o. intake over the last few days.  She has had no recent sick contacts, no fevers or chills.  Patient is still passing flatus but is decreased from her baseline.  Last bowel movement was day before yesterday.  Patient has never had bowel obstruction in the past.  She denies being on blood thinners.  CT scan obtained showed small bowel dilation with air fluid levels without exact transition point but decompressed distal loops of small bowel consistent with small bowel obstruction.  Labs notable for elevated creatinine to 1.82.  10 point review of systems is negative except as listed above in HPI.  Objective  Past Medical History: 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   Severe by CT April 2012   Degenerative joint disease 05/26/2014   Dehydration 05/06/2012   Dermatitis 01/20/2010   Dyspnea 02/03/2014   Easy bruising 12/02/2015   Epigastric pain 12/14/2021   Essential hypertension 02/24/2010   Fatigue 11/25/2014   Generalized anxiety disorder 11/16/2011   GERD    Hepatic abscess 05/03/2012   History of kidney stones    History of skin  cancer    leg   Hives 04/08/2014   Hyperlipidemia    Hypokalemia 05/06/2012   Hypothyroidism 02/12/2008   Insomnia    Internal hemorrhoids 03/22/2022   Irritable bowel syndrome with diarrhea 10/16/2022   Liver abscess 06/19/2012   Low blood pressure 06/19/2012   Lumbar radiculopathy 05/27/2021   Major depressive disorder 12/14/2021   Menopausal and postmenopausal disorder 03/09/2009   Mild dementia, concerns for Alzheimer's disease 12/12/2022   Normocytic anemia 05/07/2012   Osteopenia    Osteoporosis 01/30/2018   Penetrating forearm wound, left 05/16/2020   Peripheral neuropathy 02/05/2013   Polycystic kidney 02/12/2008   Pruritus ani 08/17/2022   Rash 11/20/2011   Rectal prolapse s/p robotic LAR/rectopexy 07/26/2022   Right wrist pain 11/09/2019   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   Tachycardia, unspecified 10/05/2009   UTI (urinary tract infection) 03/22/2022   Vaginal itching 06/26/2012   Vitamin D deficiency 11/10/2009    Past Surgical History: Past Surgical History:  Procedure Laterality Date   ABDOMINAL HYSTERECTOMY     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     bladder operation, but per sling procedure     BUNIONECTOMY     CERVICAL DISC SURGERY     fx rt ankle     INGUINAL HERNIA REPAIR Left 10/26/2022   Procedure: LAPAROSCOPIC BILATERAL SPIGELIAN HERNIA, BILATERAL INGUINAL HERNIA  AND LEFT FEMORAL HERNIA REPAIR WITH MESH AND TAP BLOCK;  Surgeon: Karie Soda, MD;  Location: [facility];  Service: General;  Laterality: Left;  120 ROOM 5   left temporal bx artery-negative 2  PARTIAL NEPHRECTOMY Left    percutaneous drainage of liver abscess  05/06/2012   PROCTOSCOPY N/A 07/26/2022   Procedure: RIGID PROCTOSCOPY;  Surgeon: Karie Soda, MD;  Location: [facility];  Service: General;  Laterality: N/A;   RECTOPEXY N/A 07/26/2022   Procedure: RECTOPEXY;  Surgeon: Karie Soda, MD;  Location: [facility];  Service: General;  Laterality: N/A;    Family History:  Family History  Problem  Relation Age of Onset   Stroke Mother    Stomach cancer Sister    Bipolar disorder Brother    Memory loss Other        grandparent   Colon cancer Neg Hx    Colon polyps Neg Hx    Esophageal cancer Neg Hx    Rectal cancer Neg Hx     Social History:  reports that she has never smoked. She has never used smokeless tobacco. She reports that she does not drink alcohol and does not use drugs.  Allergies:  Allergies  Allergen Reactions   Codeine Swelling   Shellfish Allergy Swelling    Throat swells   Sulfonamide Derivatives Swelling   Zocor [Simvastatin] Other (See Comments)    Cognitive decrease    Medications: I have reviewed the patient's current medications.  Labs: I have personally reviewed all labs for the past 24h  Imaging: I have personally reviewed and interpreted all imaging for the past 24h and agree with the radiologist's impression.  Physical Exam Blood pressure (!) 143/70, pulse 85, temperature 98.4 F (36.9 C), temperature source Oral, resp. rate 20, SpO2 90%. Constitutional: well-developed, well-nourished HEENT: pupils equal, round, reactive to light, moist conjunctiva, hearing intact Oropharynx: mucous membranes moist CV: Regular rate and rhythm, normotensive Chest: equal chest rise bilaterally normal respiratory effort on room air Abdomen: soft, mildly distended, nontender, well healed remote surgical incisions Extremities: moves all extremities, no peripheral edema Skin: warm, dry, no rashes Psych: normal memory, normal mood/affect  Neuro: No focal neurologic deficits, A&Ox3    Assessment   [name] is an 83 y.o. female who presents to ED with abdominal pain, N/V, diarrhea and workup concerning for SBO.  Plan  - SBO protocol ordered. Okay for patient to drink gastrografin PO given no longer having  nausea or emesis at this time. If she develops any nausea/emesis would place NGT and then proceed with gastrografin administration per tube. - FEN -  strict NPO, ok for swabs for comfort - DVT - SCDs,  ok for SQH from surgical perspective  I discussed with patient that no need for urgent surgical intervention. We discussed that if contrast does not make it to the colon or she does not tolerate contrast even if NGT placed, then she may require an operation during this admission which our team will discuss with her further if that becomes the case.  I reviewed ED provider notes, last 24 h vitals and pain scores, last 48 h intake and output, last 24 h labs and trends, and last 24 h imaging results.  I spent a total of 60 minutes in both face-to-face and non-face-to-face activities, excluding procedures performed, for this visit on the date of this encounter.  Donata Duff, MD [facility]

## 2023-10-19 ENCOUNTER — Ambulatory Visit: Payer: PPO | Admitting: Internal Medicine

## 2023-10-19 DIAGNOSIS — K56609 Unspecified intestinal obstruction, unspecified as to partial versus complete obstruction: Secondary | ICD-10-CM | POA: Diagnosis not present

## 2023-10-19 LAB — BASIC METABOLIC PANEL
Anion gap: 9 (ref 5–15)
BUN: 38 mg/dL — ABNORMAL HIGH (ref 8–23)
CO2: 20 mmol/L — ABNORMAL LOW (ref 22–32)
Calcium: 8.5 mg/dL — ABNORMAL LOW (ref 8.9–10.3)
Chloride: 109 mmol/L (ref 98–111)
Creatinine, Ser: 1.55 mg/dL — ABNORMAL HIGH (ref 0.44–1.00)
GFR, Estimated: 33 mL/min — ABNORMAL LOW (ref >60)
Glucose, Bld: 95 mg/dL (ref 70–99)
Potassium: 4.7 mmol/L (ref 3.5–5.1)
Sodium: 138 mmol/L (ref 135–145)

## 2023-10-19 LAB — TROPONIN I (HIGH SENSITIVITY): Troponin I (High Sensitivity): 15 ng/L (ref <18)

## 2023-10-19 MED ORDER — PANTOPRAZOLE SODIUM 40 MG PO TBEC: 40.0000 mg | DELAYED_RELEASE_TABLET | Freq: Every day | ORAL | Status: DC

## 2023-10-19 MED ORDER — DIPHENHYDRAMINE HCL 25 MG PO CAPS
25.0000 mg | ORAL_CAPSULE | Freq: Four times a day (QID) | ORAL | Status: DC | PRN
  Administered 2023-10-19: 25 mg via ORAL
  Filled 2023-10-19: qty 1

## 2023-10-19 MED ORDER — DICLOFENAC SODIUM 1 % EX GEL
2.0000 g | Freq: Four times a day (QID) | CUTANEOUS | Status: DC | PRN
  Filled 2023-10-19: qty 100

## 2023-10-19 MED ORDER — TRAZODONE HCL 50 MG PO TABS
100.0000 mg | ORAL_TABLET | Freq: Every evening | ORAL | Status: DC | PRN
  Administered 2023-10-20: 100 mg via ORAL
  Filled 2023-10-19: qty 2

## 2023-10-19 MED ORDER — MELATONIN 5 MG PO TABS: 10.0000 mg | ORAL_TABLET | Freq: Every evening | ORAL | Status: DC | PRN

## 2023-10-19 MED ORDER — PANTOPRAZOLE SODIUM 40 MG PO TBEC
40.0000 mg | DELAYED_RELEASE_TABLET | Freq: Two times a day (BID) | ORAL | Status: DC
  Administered 2023-10-19 – 2023-10-20 (×3): 40 mg via ORAL
  Filled 2023-10-19 (×3): qty 1

## 2023-10-19 MED ORDER — SIMETHICONE 80 MG PO CHEW
80.0000 mg | CHEWABLE_TABLET | Freq: Four times a day (QID) | ORAL | Status: DC | PRN
  Administered 2023-10-19: 80 mg via ORAL
  Filled 2023-10-19: qty 1

## 2023-10-19 MED ORDER — WITCH HAZEL-GLYCERIN EX PADS
MEDICATED_PAD | CUTANEOUS | Status: DC | PRN
  Filled 2023-10-19: qty 100

## 2023-10-19 MED ORDER — AMLODIPINE BESYLATE 10 MG PO TABS
10.0000 mg | ORAL_TABLET | Freq: Every day | ORAL | Status: DC
  Administered 2023-10-19 – 2023-10-20 (×2): 10 mg via ORAL
  Filled 2023-10-19 (×2): qty 1

## 2023-10-19 NOTE — Progress Notes (Signed)
 Progress Note     Subjective: Patient states she is having new chest pain and not feeling well. Also having irritation/itching from gown. States pain is deeper and not associated with the irritation on her chest from her gown. No nausea. States not passing flatus  Objective: Vital signs in last 24 hours: Temp:  [98 F (36.7 C)-98.5 F (36.9 C)] 98.4 F (36.9 C) (02/28 0727) Pulse Rate:  [59-72] 72 (02/28 0727) Resp:  [16-18] 16 (02/28 0727) BP: (90-181)/(44-82) 181/82 (02/28 0727) SpO2:  [94 %-96 %] 96 % (02/28 0727) Last BM Date : 10/18/23  Intake/Output from previous day: 02/27 0701 - 02/28 0700 In: 681.3 [I.V.:681.3] Out: -  Intake/Output this shift: No intake/output data recorded.  PE: Patient appears uncomfortable due to chest discomfort Chest without MSK pain, does appear irritated Abdomen soft, nontender, nondistended   Lab Results:  Recent Labs    10/17/23 1732 10/18/23 0443  WBC 9.1 6.2  HGB 14.0 11.5*  HCT 42.2 34.8*  PLT 179 137*   BMET Recent Labs    10/18/23 0443 10/19/23 0458  NA 140 138  K 3.3* 4.7  CL 108 109  CO2 17* 20*  GLUCOSE 149* 95  BUN 35* 38*  CREATININE 1.56* 1.55*  CALCIUM 8.1* 8.5*   PT/INR No results for input(s): "LABPROT", "INR" in the last 72 hours. CMP     Component Value Date/Time   NA 138 10/19/2023 0458   K 4.7 10/19/2023 0458   CL 109 10/19/2023 0458   CO2 20 (L) 10/19/2023 0458   GLUCOSE 95 10/19/2023 0458   BUN 38 (H) 10/19/2023 0458   CREATININE 1.55 (H) 10/19/2023 0458   CREATININE 0.81 06/21/2012 1217   CALCIUM 8.5 (L) 10/19/2023 0458   PROT 6.8 10/17/2023 1732   ALBUMIN 4.2 10/17/2023 1732   AST 18 10/17/2023 1732   ALT 15 10/17/2023 1732   ALKPHOS 47 10/17/2023 1732   BILITOT 1.2 10/17/2023 1732   GFRNONAA 33 (L) 10/19/2023 0458   GFRNONAA 72 06/21/2012 1217   GFRAA >60 04/16/2017 0535   GFRAA 83 06/21/2012 1217   Lipase     Component Value Date/Time   LIPASE 23 10/17/2023 1732        Studies/Results: DG Abd Portable 1V-Small Bowel Obstruction Protocol-initial, 8 hr delay Result Date: 10/18/2023 CLINICAL DATA:  Small-bowel obstruction. EXAM: PORTABLE ABDOMEN - 1 VIEW COMPARISON:  CT scan abdomen and pelvis from yesterday. FINDINGS: The bowel gas pattern is non-obstructive. There is paucity of bowel gas. Positive oral contrast noted in the right hemicolon thus extruding complete obstruction. No evidence of pneumoperitoneum, within the limitations of a supine film. No acute osseous abnormalities. The soft tissues are within normal limits. Surgical changes, devices, tubes and lines: There are surgical clips in the right upper quadrant, typical of a previous cholecystectomy. IMPRESSION: *Nonobstructive bowel gas pattern. Electronically Signed   By: Jules Schick M.D.   On: 10/18/2023 13:28   CT ABDOMEN PELVIS WO CONTRAST Result Date: 10/17/2023 CLINICAL DATA:  Fever, diarrhea, abdominal pain EXAM: CT ABDOMEN AND PELVIS WITHOUT CONTRAST TECHNIQUE: Multidetector CT imaging of the abdomen and pelvis was performed following the standard protocol without IV contrast. Unenhanced CT was performed per clinician order. Lack of IV contrast limits sensitivity and specificity, especially for evaluation of abdominal/pelvic solid viscera. RADIATION DOSE REDUCTION: This exam was performed according to the departmental dose-optimization program which includes automated exposure control, adjustment of the mA and/or kV according to patient size and/or use of iterative reconstruction  technique. COMPARISON:  04/13/2017 FINDINGS: Lower chest: No acute pleural or parenchymal lung disease. Hepatobiliary: Numerous hepatic cysts are again noted. Otherwise unremarkable unenhanced appearance of the liver. Prior cholecystectomy. No biliary duct dilation. Pancreas: Unremarkable unenhanced appearance. Spleen: Unremarkable unenhanced appearance. Adrenals/Urinary Tract: Innumerable renal cysts are again identified  consistent with polycystic kidney disease. No specific imaging follow-up is recommended. No urinary tract calculi or obstruction. The adrenals are stable. Bladder is decompressed, limiting its evaluation. Stomach/Bowel: Postsurgical changes from sigmoid colon resection and reanastomosis. There is diffuse small bowel dilatation measuring up to 2.5 cm in diameter, with scattered gas fluid levels. Exact transition point is not well visualized, but the distal jejunum and ileum within the lower pelvis are decompressed. Findings are consistent with small-bowel obstruction. No bowel wall thickening or inflammatory change. Small hiatal hernia. Vascular/Lymphatic: Aortic atherosclerosis. No enlarged abdominal or pelvic lymph nodes. Reproductive: Status post hysterectomy. No adnexal masses. Other: No free fluid or free intraperitoneal gas. No abdominal wall hernia. Musculoskeletal: No acute or destructive bony abnormalities. Chronic appearing compression deformity within the superior endplate of the L4 vertebral body, with approximately 50% loss of height. Reconstructed images demonstrate no additional findings. IMPRESSION: 1. Diffuse small bowel dilatation and scattered gas fluid levels consistent with small-bowel obstruction. The exact transition point is not identified, but distal loops of jejunum and ileum are decompressed within the pelvis. Continued radiographic follow-up recommended. 2. Hepatic and renal cysts again noted, compatible with history of polycystic kidney disease. No specific imaging follow-up recommended. 3.  Aortic Atherosclerosis (ICD10-I70.0). 4. Hiatal hernia. Electronically Signed   By: Sharlet Salina M.D.   On: 10/17/2023 21:42    Anti-infectives: Anti-infectives (From admission, onward)    None        Assessment/Plan  SBO  LOS: 2 days   - EKG/trops ordered, communicated directly with hospitalist concerning new chest pain - Benadryl ordered for contact dermatitis - Okay to advance  to [facility] when felt appropriate by [facility]. Holding off on ordering until [facility] evaluates patient's new onset chest pain and EKG/trops result.  I reviewed hospitalist notes, last 24 h vitals and pain scores, last 48 h intake and output, last 24 h labs and trends, and last 24 h imaging results.  This care required moderate level of medical decision making.    Lysle Rubens, MD [facility] 10/19/2023, 8:02 AM Please see Amion for pager number during day hours 7:00am-4:30pm

## 2023-10-19 NOTE — Progress Notes (Signed)
 [facility] PROGRESS NOTE    Progress Note  Alyssa Fernandez  ZOX:[id number] DOB: 10-18-1940 DOA: 10/17/2023 PCP: Corwin Levins, MD     Brief Narrative:   Alyssa Fernandez is an 83 y.o. female past medical history significant for essential hypertension, chronic kidney see stage III, dementia hyperlipidemia has been having abdominal pain for about 5 days prior to admission.  Which have progressively gotten worse CT scan of the abdomen and pelvis shows small bowel obstruction General Surgery was consulted.  Assessment/Plan:   SBO (small bowel obstruction) (HCC) General Surgery relates okay to resume diet. Go ahead and start her on clear liquid diet. Appreciate general surgery's further management per general surgery.  Atypical chest pain: She has been having for at least a week intermittent on and off nothing makes it better or worse. Cycle cardiac enzymes check twelve-lead EKG.  Essential hypertension: Continue labetalol and amlodipine. Hold ARB and Aldactone due to acute kidney injury.  Acute kidney injury on chronic disease stage IIIa: Baseline creatinine around 1.3. On admission 1.8. Continue to hold ARB and Aldactone. Her creatinine has improved to baseline.  Hypokalemia: Repleted now improved.  Hypothyroidism Continue Synthroid.  History of dementia: While he is n.p.o. we will hold Aricept and Namenda.  History of depression: Continue Zoloft and trazodone, once he is able to take orals.  Hyperlipidemia Hold statins for now.    DVT prophylaxis: lovenox Family Communication:none Status is: Inpatient Remains inpatient appropriate because: Small bowel obstruction    Code Status:     Code Status Orders  (From admission, onward)           Start     Ordered   10/18/23 0044  Do not attempt resuscitation (DNR)- Limited -Do Not Intubate (DNI)  Continuous       Question Answer Comment  If pulseless and not breathing No CPR or chest compressions.   In  Pre-Arrest Conditions (Patient Is Breathing and Has A Pulse) Do not intubate. Provide all appropriate non-invasive medical interventions. Avoid ICU transfer unless indicated or required.   Consent: Discussion documented in EHR or advanced directives reviewed      10/18/23 0045           Code Status History     Date Active Date Inactive Code Status Order ID Comments User Context   07/26/2022 1412 07/31/2022 1647 Full Code [id number]  Karie Soda, MD Inpatient   04/13/2017 1438 04/16/2017 1817 Full Code [id number]  Elwyn Reach ED   05/06/2012 1813 05/10/2012 1840 Full Code [id number]  Rodolph Bong, MD Inpatient      Advance Directive Documentation    Flowsheet Row Most Recent Value  Type of Advance Directive Out of facility DNR (pink MOST or yellow form)  Pre-existing out of facility DNR order (yellow form or pink MOST form) --  "MOST" Form in Place? --         IV Access:   Peripheral IV   Procedures and diagnostic studies:   DG Abd Portable 1V-Small Bowel Obstruction Protocol-initial, 8 hr delay Result Date: 10/18/2023 CLINICAL DATA:  Small-bowel obstruction. EXAM: PORTABLE ABDOMEN - 1 VIEW COMPARISON:  CT scan abdomen and pelvis from yesterday. FINDINGS: The bowel gas pattern is non-obstructive. There is paucity of bowel gas. Positive oral contrast noted in the right hemicolon thus extruding complete obstruction. No evidence of pneumoperitoneum, within the limitations of a supine film. No acute osseous abnormalities. The soft tissues are within normal limits. Surgical changes, devices, tubes and  lines: There are surgical clips in the right upper quadrant, typical of a previous cholecystectomy. IMPRESSION: *Nonobstructive bowel gas pattern. Electronically Signed   By: Jules Schick M.D.   On: 10/18/2023 13:28   CT ABDOMEN PELVIS WO CONTRAST Result Date: 10/17/2023 CLINICAL DATA:  Fever, diarrhea, abdominal pain EXAM: CT ABDOMEN AND PELVIS WITHOUT CONTRAST TECHNIQUE:  Multidetector CT imaging of the abdomen and pelvis was performed following the standard protocol without IV contrast. Unenhanced CT was performed per clinician order. Lack of IV contrast limits sensitivity and specificity, especially for evaluation of abdominal/pelvic solid viscera. RADIATION DOSE REDUCTION: This exam was performed according to the departmental dose-optimization program which includes automated exposure control, adjustment of the mA and/or kV according to patient size and/or use of iterative reconstruction technique. COMPARISON:  04/13/2017 FINDINGS: Lower chest: No acute pleural or parenchymal lung disease. Hepatobiliary: Numerous hepatic cysts are again noted. Otherwise unremarkable unenhanced appearance of the liver. Prior cholecystectomy. No biliary duct dilation. Pancreas: Unremarkable unenhanced appearance. Spleen: Unremarkable unenhanced appearance. Adrenals/Urinary Tract: Innumerable renal cysts are again identified consistent with polycystic kidney disease. No specific imaging follow-up is recommended. No urinary tract calculi or obstruction. The adrenals are stable. Bladder is decompressed, limiting its evaluation. Stomach/Bowel: Postsurgical changes from sigmoid colon resection and reanastomosis. There is diffuse small bowel dilatation measuring up to 2.5 cm in diameter, with scattered gas fluid levels. Exact transition point is not well visualized, but the distal jejunum and ileum within the lower pelvis are decompressed. Findings are consistent with small-bowel obstruction. No bowel wall thickening or inflammatory change. Small hiatal hernia. Vascular/Lymphatic: Aortic atherosclerosis. No enlarged abdominal or pelvic lymph nodes. Reproductive: Status post hysterectomy. No adnexal masses. Other: No free fluid or free intraperitoneal gas. No abdominal wall hernia. Musculoskeletal: No acute or destructive bony abnormalities. Chronic appearing compression deformity within the superior  endplate of the L4 vertebral body, with approximately 50% loss of height. Reconstructed images demonstrate no additional findings. IMPRESSION: 1. Diffuse small bowel dilatation and scattered gas fluid levels consistent with small-bowel obstruction. The exact transition point is not identified, but distal loops of jejunum and ileum are decompressed within the pelvis. Continued radiographic follow-up recommended. 2. Hepatic and renal cysts again noted, compatible with history of polycystic kidney disease. No specific imaging follow-up recommended. 3.  Aortic Atherosclerosis (ICD10-I70.0). 4. Hiatal hernia. Electronically Signed   By: Sharlet Salina M.D.   On: 10/17/2023 21:42     Medical Consultants:   None.   Subjective:    Alyssa Fernandez complaining of intermittent chest pain  Objective:    Vitals:   10/18/23 2012 10/18/23 2347 10/19/23 0520 10/19/23 0727  BP: (!) 156/69 125/73 (!) 147/79 (!) 181/82  Pulse: 70 67 69 72  Resp: 16 17 17 16   Temp: 98.5 F (36.9 C) 98.1 F (36.7 C) 98.2 F (36.8 C) 98.4 F (36.9 C)  TempSrc: Oral Oral Oral   SpO2: 95% 96% 95% 96%   SpO2: 96 % O2 Flow Rate (L/min): 2 L/min   Intake/Output Summary (Last 24 hours) at 10/19/2023 0810 Last data filed at 10/18/2023 1700 Gross per 24 hour  Intake 681.25 ml  Output --  Net 681.25 ml   There were no vitals filed for this visit.  Exam: General exam: In no acute distress. Respiratory system: Good air movement and clear to auscultation. Cardiovascular system: S1 & S2 heard, RRR. No JVD. Gastrointestinal system: Abdomen is nondistended, soft and nontender.  Extremities: No pedal edema. Skin: No rashes, lesions or ulcers Psychiatry:  Judgement and insight appear normal. Mood & affect appropriate.    Data Reviewed:    Labs: Basic Metabolic Panel: Recent Labs  Lab 10/17/23 1732 10/18/23 0443 10/19/23 0458  NA 138 140 138  K 4.3 3.3* 4.7  CL 106 108 109  CO2 21* 17* 20*  GLUCOSE 134* 149*  95  BUN 37* 35* 38*  CREATININE 1.82* 1.56* 1.55*  CALCIUM 9.4 8.1* 8.5*   GFR CrCl cannot be calculated (Unknown ideal weight.). Liver Function Tests: Recent Labs  Lab 10/17/23 1732  AST 18  ALT 15  ALKPHOS 47  BILITOT 1.2  PROT 6.8  ALBUMIN 4.2   Recent Labs  Lab 10/17/23 1732  LIPASE 23   No results for input(s): "AMMONIA" in the last 168 hours. Coagulation profile No results for input(s): "INR", "PROTIME" in the last 168 hours. COVID-19 Labs  No results for input(s): "DDIMER", "FERRITIN", "LDH", "CRP" in the last 72 hours.  Lab Results  Component Value Date   SARSCOV2NAA NEGATIVE 10/17/2023    CBC: Recent Labs  Lab 10/17/23 1732 10/18/23 0443  WBC 9.1 6.2  HGB 14.0 11.5*  HCT 42.2 34.8*  MCV 93.4 94.6  PLT 179 137*   Cardiac Enzymes: No results for input(s): "CKTOTAL", "CKMB", "CKMBINDEX", "TROPONINI" in the last 168 hours. BNP (last 3 results) No results for input(s): "PROBNP" in the last 8760 hours. CBG: No results for input(s): "GLUCAP" in the last 168 hours. D-Dimer: No results for input(s): "DDIMER" in the last 72 hours. Hgb A1c: No results for input(s): "HGBA1C" in the last 72 hours. Lipid Profile: No results for input(s): "CHOL", "HDL", "LDLCALC", "TRIG", "CHOLHDL", "LDLDIRECT" in the last 72 hours. Thyroid function studies: No results for input(s): "TSH", "T4TOTAL", "T3FREE", "THYROIDAB" in the last 72 hours.  Invalid input(s): "FREET3" Anemia work up: No results for input(s): "VITAMINB12", "FOLATE", "FERRITIN", "TIBC", "IRON", "RETICCTPCT" in the last 72 hours. Sepsis Labs: Recent Labs  Lab 10/17/23 1732 10/18/23 0443  WBC 9.1 6.2   Microbiology Recent Results (from the past 240 hours)  Resp panel by RT-PCR (RSV, Flu A&B, Covid) Anterior Nasal Swab     Status: None   Collection Time: 10/17/23 10:41 PM   Specimen: Anterior Nasal Swab  Result Value Ref Range Status   SARS Coronavirus 2 by RT PCR NEGATIVE NEGATIVE Final    Influenza A by PCR NEGATIVE NEGATIVE Final   Influenza B by PCR NEGATIVE NEGATIVE Final    Comment: (NOTE) The Xpert Xpress SARS-CoV-2/FLU/RSV plus assay is intended as an aid in the diagnosis of influenza from Nasopharyngeal swab specimens and should not be used as a sole basis for treatment. Nasal washings and aspirates are unacceptable for Xpert Xpress SARS-CoV-2/FLU/RSV testing.  Fact Sheet for Patients: BloggerCourse.com  Fact Sheet for Healthcare Providers: SeriousBroker.it  This test is not yet approved or cleared by the [place] FDA and has been authorized for detection and/or diagnosis of SARS-CoV-2 by FDA under an Emergency Use Authorization (EUA). This EUA will remain in effect (meaning this test can be used) for the duration of the COVID-19 declaration under Section 564(b)(1) of the Act, 21 U.S.C. section 360bbb-3(b)(1), unless the authorization is terminated or revoked.     Resp Syncytial Virus by PCR NEGATIVE NEGATIVE Final    Comment: (NOTE) Fact Sheet for Patients: BloggerCourse.com  Fact Sheet for Healthcare Providers: SeriousBroker.it  This test is not yet approved or cleared by the [place] FDA and has been authorized for detection and/or diagnosis of SARS-CoV-2 by FDA under  an Emergency Use Authorization (EUA). This EUA will remain in effect (meaning this test can be used) for the duration of the COVID-19 declaration under Section 564(b)(1) of the Act, 21 U.S.C. section 360bbb-3(b)(1), unless the authorization is terminated or revoked.  Performed at [facility], [address], [place], [place] [address]      Medications:    amLODipine  5 mg Oral Daily   busPIRone  7.5 mg Oral BID   donepezil  10 mg Oral Daily   labetalol  300 mg Oral BID   memantine  5 mg Oral BID   rosuvastatin  20 mg Oral QPM   sertraline  100 mg Oral Daily    Continuous Infusions:      LOS: 2 days   Marinda Elk  [facility]  10/19/2023, 8:10 AM

## 2023-10-19 NOTE — TOC Initial Note (Signed)
 Transition of Care [facility]) - Initial/Assessment Note    Patient Details  Name: Alyssa Fernandez MRN: [id number] Date of Birth: Jan 11, 1941  [facility] of [facility]) CM/SW Contact:    Marliss Coots, LCSW Phone Number: 10/19/2023, 2:14 PM  Clinical Narrative:                  2:14 PM Per progressions, patient is from [facility] and expected to return there at discharge is medically possible.  Expected Discharge Plan: Assisted Living Barriers to Discharge: Continued Medical Work up   Patient Goals and CMS Choice Patient states their goals for this hospitalization and ongoing recovery are:: return to ALF          Expected Discharge Plan and Services     Post Acute Care Choice:  (return to ALF) Living arrangements for the past 2 months: Assisted Living Facility                                      Prior Living Arrangements/Services Living arrangements for the past 2 months: Assisted Living Facility Lives with:: Facility Resident Patient language and need for interpreter reviewed:: Yes              Criminal Activity/Legal Involvement Pertinent to Current Situation/Hospitalization: No - Comment as needed  Activities of Daily Living   ADL Screening (condition at time of admission) Independently performs ADLs?: Yes (appropriate for developmental age) Is the patient deaf or have difficulty hearing?: No Does the patient have difficulty seeing, even when wearing glasses/contacts?: No Does the patient have difficulty concentrating, remembering, or making decisions?: No  Permission Sought/Granted Permission sought to share information with : Facility Medical sales representative, Family Supports Permission granted to share information with : No (Contact information on chart)  Share Information with NAME: Malachi Pro  Permission granted to share info w AGENCY: [facility]  Permission granted to share info w Relationship: Daughter  Permission granted to share info w Contact  Information: [phone]  Emotional Assessment       Orientation: : Oriented to Self, Oriented to Place, Oriented to  Time, Oriented to Situation Alcohol / Substance Use: Not Applicable Psych Involvement: No (comment)  Admission diagnosis:  Small bowel obstruction (HCC) [K56.609] SBO (small bowel obstruction) (HCC) [K56.609] Generalized abdominal pain [R10.84] Intestinal obstruction, unspecified cause, unspecified whether partial or complete (HCC) [K56.609] Nausea and vomiting, unspecified vomiting type [R11.2] Patient Active Problem List   Diagnosis Date Noted   Renal failure (ARF), acute on chronic (HCC) 10/18/2023   SBO (small bowel obstruction) (HCC) 10/17/2023   Encounter for well adult exam with abnormal findings 04/18/2023   Osteopenia 12/12/2022   Mild dementia, concerns for Alzheimer's disease 12/12/2022   Irritable bowel syndrome with diarrhea 10/16/2022   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   Pruritus ani 08/17/2022   Rectal prolapse s/p robotic LAR/rectopexy 07/26/2022 07/26/2022   Decreased anal sphincter tone 06/21/2022   Internal hemorrhoids 03/22/2022   Major depressive disorder 12/14/2021   Lumbar radiculopathy 05/27/2021   Osteoporosis 01/30/2018   Atherosclerosis of aorta (HCC) 08/01/2017   CKD (chronic kidney disease) stage 3, GFR 30-59 ml/min (HCC) 08/01/2017   Acute pyelonephritis 04/13/2017   Chronic low back pain 05/04/2016   Easy bruising 12/02/2015   Degenerative joint disease 05/26/2014   Dyspnea 02/03/2014   Peripheral neuropathy 02/05/2013   Vaginal itching 06/26/2012   Low blood pressure 06/19/2012   Liver abscess 06/19/2012  Normocytic anemia 05/07/2012   Hypokalemia 05/06/2012   Generalized anxiety disorder 11/16/2011   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   Essential hypertension 02/24/2010   Dermatitis 01/20/2010   Vitamin D deficiency 11/10/2009   Tachycardia, unspecified 10/05/2009   Menopausal and postmenopausal disorder  03/09/2009   Insomnia, unspecified 06/17/2008   Hypothyroidism 02/12/2008   Polycystic kidney 02/12/2008   GERD 06/12/2007   Hyperlipidemia 03/25/2007   PCP:  Corwin Levins, MD Pharmacy:  No Pharmacies Listed    Social Drivers of Health (SDOH) Social History: SDOH Screenings   Food Insecurity: No Food Insecurity (10/18/2023)  Housing: Low Risk  (10/18/2023)  Transportation Needs: No Transportation Needs (10/18/2023)  Utilities: Not At Risk (10/18/2023)  Alcohol Screen: Low Risk  (10/26/2021)  Depression (PHQ2-9): Low Risk  (04/18/2023)  Financial Resource Strain: Patient Declined (04/18/2023)  Physical Activity: Insufficiently Active (04/18/2023)  Social Connections: Unknown (10/18/2023)  Stress: Stress Concern Present (04/18/2023)  Tobacco Use: Low Risk  (10/18/2023)  Health Literacy: Inadequate Health Literacy (04/18/2023)   SDOH Interventions:     Readmission Risk Interventions    07/27/2022   10:56 AM  Readmission Risk Prevention Plan  Transportation Screening Complete  PCP or Specialist Appt within 5-7 Days Complete  Home Care Screening Complete  Medication Review (RN CM) Complete

## 2023-10-19 NOTE — Care Management Important Message (Signed)
 Important Message  Patient Details  Name: Alyssa Fernandez MRN: [id number] Date of Birth: July 08, 1941   Important Message Given:  Yes - Medicare IM     Dorena Bodo 10/19/2023, 3:37 PM

## 2023-10-19 NOTE — Plan of Care (Signed)
  Problem: Education: Goal: Knowledge of General Education information will improve Description: Including pain rating scale, medication(s)/side effects and non-pharmacologic comfort measures Outcome: Progressing   Problem: Health Behavior/Discharge Planning: Goal: Ability to manage health-related needs will improve Outcome: Progressing   Problem: Clinical Measurements: Goal: Diagnostic test results will improve Outcome: Progressing   Problem: Coping: Goal: Level of anxiety will decrease Outcome: Progressing   Problem: Safety: Goal: Ability to remain free from injury will improve Outcome: Progressing   

## 2023-10-20 DIAGNOSIS — K56609 Unspecified intestinal obstruction, unspecified as to partial versus complete obstruction: Secondary | ICD-10-CM | POA: Diagnosis not present

## 2023-10-20 MED ORDER — HYDRALAZINE HCL 25 MG PO TABS
25.0000 mg | ORAL_TABLET | Freq: Three times a day (TID) | ORAL | Status: DC
  Filled 2023-10-20: qty 1

## 2023-10-20 MED ORDER — SODIUM CHLORIDE 0.9 % IV SOLN: INTRAVENOUS | Status: AC

## 2023-10-20 MED ORDER — PANTOPRAZOLE SODIUM 40 MG PO TBEC
40.0000 mg | DELAYED_RELEASE_TABLET | Freq: Every day | ORAL | Status: DC
  Administered 2023-10-21: 40 mg via ORAL
  Filled 2023-10-20: qty 1

## 2023-10-20 MED ORDER — AMLODIPINE BESYLATE 10 MG PO TABS: 10.0000 mg | ORAL_TABLET | Freq: Every day | ORAL | Status: DC

## 2023-10-20 NOTE — Progress Notes (Signed)
   Progress Note     Subjective: Cardiac workup negative.  Reports 2 small BM. Abdominal pain has resolved. Denies complaints this morning.   Objective: Vital signs in last 24 hours: Temp:  [98.1 F (36.7 C)-98.6 F (37 C)] 98.1 F (36.7 C) (03/01 0518) Pulse Rate:  [58-64] 60 (03/01 0518) Resp:  [16-19] 19 (03/01 0518) BP: (108-133)/(65-75) 123/68 (03/01 0518) SpO2:  [95 %-96 %] 95 % (03/01 0518) Last BM Date : 10/18/23  Intake/Output from previous day: 02/28 0701 - 03/01 0700 In: -  Out: 2 [Stool:2] Intake/Output this shift: No intake/output data recorded.  PE: Gen: female, NAD Abdomen soft, nontender, nondistended   Lab Results:  Recent Labs    10/17/23 1732 10/18/23 0443  WBC 9.1 6.2  HGB 14.0 11.5*  HCT 42.2 34.8*  PLT 179 137*   BMET Recent Labs    10/18/23 0443 10/19/23 0458  NA 140 138  K 3.3* 4.7  CL 108 109  CO2 17* 20*  GLUCOSE 149* 95  BUN 35* 38*  CREATININE 1.56* 1.55*  CALCIUM 8.1* 8.5*   PT/INR No results for input(s): "LABPROT", "INR" in the last 72 hours. CMP     Component Value Date/Time   NA 138 10/19/2023 0458   K 4.7 10/19/2023 0458   CL 109 10/19/2023 0458   CO2 20 (L) 10/19/2023 0458   GLUCOSE 95 10/19/2023 0458   BUN 38 (H) 10/19/2023 0458   CREATININE 1.55 (H) 10/19/2023 0458   CREATININE 0.81 06/21/2012 1217   CALCIUM 8.5 (L) 10/19/2023 0458   PROT 6.8 10/17/2023 1732   ALBUMIN 4.2 10/17/2023 1732   AST 18 10/17/2023 1732   ALT 15 10/17/2023 1732   ALKPHOS 47 10/17/2023 1732   BILITOT 1.2 10/17/2023 1732   GFRNONAA 33 (L) 10/19/2023 0458   GFRNONAA 72 06/21/2012 1217   GFRAA >60 04/16/2017 0535   GFRAA 83 06/21/2012 1217   Lipase     Component Value Date/Time   LIPASE 23 10/17/2023 1732       Studies/Results: DG Abd Portable 1V-Small Bowel Obstruction Protocol-initial, 8 hr delay Result Date: 10/18/2023 CLINICAL DATA:  Small-bowel obstruction. EXAM: PORTABLE ABDOMEN - 1 VIEW COMPARISON:  CT scan  abdomen and pelvis from yesterday. FINDINGS: The bowel gas pattern is non-obstructive. There is paucity of bowel gas. Positive oral contrast noted in the right hemicolon thus extruding complete obstruction. No evidence of pneumoperitoneum, within the limitations of a supine film. No acute osseous abnormalities. The soft tissues are within normal limits. Surgical changes, devices, tubes and lines: There are surgical clips in the right upper quadrant, typical of a previous cholecystectomy. IMPRESSION: *Nonobstructive bowel gas pattern. Electronically Signed   By: Jules Schick M.D.   On: 10/18/2023 13:28    Anti-infectives: Anti-infectives (From admission, onward)    None        Assessment/Plan  SBO  LOS: 3 days   - Advanced to soft diet. If she does okay with this and continues to pass gas then she could potentially discharge early tomorrow.   I reviewed hospitalist notes, last 24 h vitals and pain scores, last 48 h intake and output, last 24 h labs and trends, and last 24 h imaging results.  This care required moderate level of medical decision making.    Moise Boring, MD [facility] Surgery 10/20/2023, 7:44 AM Please see Amion for pager number during day hours 7:00am-4:30pm

## 2023-10-20 NOTE — Progress Notes (Signed)
 [facility] PROGRESS NOTE    Progress Note  Alyssa Fernandez  JYN:[id number] DOB: [birth year] DOA: 10/17/2023 PCP: Corwin Levins, MD     Brief Narrative:   Alyssa Fernandez is an 83 y.o. female past medical history significant for essential hypertension, chronic kidney see stage III, dementia hyperlipidemia has been having abdominal pain for about 5 days prior to admission.  Which have progressively gotten worse CT scan of the abdomen and pelvis shows small bowel obstruction General Surgery was consulted.  Assessment/Plan:   SBO (small bowel obstruction) (HCC) Tolerating her diet advancing if tolerating home tomorrow. Appreciate general surgery's further management per general surgery.  Atypical chest pain: She has been having for at least a week intermittent on and off nothing makes it better or worse. Cycle cardiac enzymes check twelve-lead EKG.  Essential hypertension: Continue labetalol and amlodipine. Hold ARB and Aldactone due to acute kidney injury.  Acute kidney injury on chronic disease stage IIIa: Baseline creatinine around 1.3. On admission 1.8. Continue to hold ARB and Aldactone. IV fluids recheck basic metabolic panel in the morning.  Hypokalemia: Repleted now improved.  Hypothyroidism Continue Synthroid.  History of dementia: Resume Namenda and Aricept.  History of depression: Continue Zoloft and trazodone, once he is able to take orals.  Hyperlipidemia Hold statins for now.    DVT prophylaxis: lovenox Family Communication:none Status is: Inpatient Remains inpatient appropriate because: Small bowel obstruction    Code Status:     Code Status Orders  (From admission, onward)           Start     Ordered   10/18/23 0044  Do not attempt resuscitation (DNR)- Limited -Do Not Intubate (DNI)  Continuous       Question Answer Comment  If pulseless and not breathing No CPR or chest compressions.   In Pre-Arrest Conditions (Patient Is  Breathing and Has A Pulse) Do not intubate. Provide all appropriate non-invasive medical interventions. Avoid ICU transfer unless indicated or required.   Consent: Discussion documented in EHR or advanced directives reviewed      10/18/23 0045           Code Status History     Date Active Date Inactive Code Status Order ID Comments User Context   07/26/2022 1412 07/31/2022 1647 Full Code [id number]  Karie Soda, MD Inpatient   04/13/2017 1438 04/16/2017 1817 Full Code [id number]  [facility]   05/06/2012 1813 05/10/2012 1840 Full Code [id number]  Rodolph Bong, MD Inpatient      Advance Directive Documentation    Flowsheet Row Most Recent Value  Type of Advance Directive Out of facility DNR (pink MOST or yellow form)  Pre-existing out of facility DNR order (yellow form or pink MOST form) --  "MOST" Form in Place? --         IV Access:   Peripheral IV   Procedures and diagnostic studies:   DG Abd Portable 1V-Small Bowel Obstruction Protocol-initial, 8 hr delay Result Date: 10/18/2023 CLINICAL DATA:  Small-bowel obstruction. EXAM: PORTABLE ABDOMEN - 1 VIEW COMPARISON:  CT scan abdomen and pelvis from yesterday. FINDINGS: The bowel gas pattern is non-obstructive. There is paucity of bowel gas. Positive oral contrast noted in the right hemicolon thus extruding complete obstruction. No evidence of pneumoperitoneum, within the limitations of a supine film. No acute osseous abnormalities. The soft tissues are within normal limits. Surgical changes, devices, tubes and lines: There are surgical clips in the right upper quadrant, typical  of a previous cholecystectomy. IMPRESSION: *Nonobstructive bowel gas pattern. Electronically Signed   By: Jules Schick M.D.   On: 10/18/2023 13:28     Medical Consultants:   None.   Subjective:    Alyssa Fernandez complaints tolerating her diet.  Objective:    Vitals:   10/19/23 2015 10/19/23 2118 10/20/23 0518 10/20/23 0746   BP: 114/72 133/75 123/68 (!) 172/78  Pulse: 64 62 60 65  Resp: 18  19 16   Temp: 98.2 F (36.8 C)  98.1 F (36.7 C) 98.3 F (36.8 C)  TempSrc:      SpO2: 95% 96% 95% 96%   SpO2: 96 % O2 Flow Rate (L/min): 2 L/min   Intake/Output Summary (Last 24 hours) at 10/20/2023 1006 Last data filed at 10/20/2023 0838 Gross per 24 hour  Intake 120 ml  Output 1 ml  Net 119 ml   There were no vitals filed for this visit.  Exam: General exam: In no acute distress. Respiratory system: Good air movement and clear to auscultation. Cardiovascular system: S1 & S2 heard, RRR. No JVD. Gastrointestinal system: Abdomen is nondistended, soft and nontender.  Extremities: No pedal edema. Skin: No rashes, lesions or ulcers Psychiatry: Judgement and insight appear normal. Mood & affect appropriate.  Data Reviewed:    Labs: Basic Metabolic Panel: Recent Labs  Lab 10/17/23 1732 10/18/23 0443 10/19/23 0458  NA 138 140 138  K 4.3 3.3* 4.7  CL 106 108 109  CO2 21* 17* 20*  GLUCOSE 134* 149* 95  BUN 37* 35* 38*  CREATININE 1.82* 1.56* 1.55*  CALCIUM 9.4 8.1* 8.5*   GFR CrCl cannot be calculated (Unknown ideal weight.). Liver Function Tests: Recent Labs  Lab 10/17/23 1732  AST 18  ALT 15  ALKPHOS 47  BILITOT 1.2  PROT 6.8  ALBUMIN 4.2   Recent Labs  Lab 10/17/23 1732  LIPASE 23   No results for input(s): "AMMONIA" in the last 168 hours. Coagulation profile No results for input(s): "INR", "PROTIME" in the last 168 hours. COVID-19 Labs  No results for input(s): "DDIMER", "FERRITIN", "LDH", "CRP" in the last 72 hours.  Lab Results  Component Value Date   SARSCOV2NAA NEGATIVE 10/17/2023    CBC: Recent Labs  Lab 10/17/23 1732 10/18/23 0443  WBC 9.1 6.2  HGB 14.0 11.5*  HCT 42.2 34.8*  MCV 93.4 94.6  PLT 179 137*   Cardiac Enzymes: No results for input(s): "CKTOTAL", "CKMB", "CKMBINDEX", "TROPONINI" in the last 168 hours. BNP (last 3 results) No results for input(s):  "PROBNP" in the last 8760 hours. CBG: No results for input(s): "GLUCAP" in the last 168 hours. D-Dimer: No results for input(s): "DDIMER" in the last 72 hours. Hgb A1c: No results for input(s): "HGBA1C" in the last 72 hours. Lipid Profile: No results for input(s): "CHOL", "HDL", "LDLCALC", "TRIG", "CHOLHDL", "LDLDIRECT" in the last 72 hours. Thyroid function studies: No results for input(s): "TSH", "T4TOTAL", "T3FREE", "THYROIDAB" in the last 72 hours.  Invalid input(s): "FREET3" Anemia work up: No results for input(s): "VITAMINB12", "FOLATE", "FERRITIN", "TIBC", "IRON", "RETICCTPCT" in the last 72 hours. Sepsis Labs: Recent Labs  Lab 10/17/23 1732 10/18/23 0443  WBC 9.1 6.2   Microbiology Recent Results (from the past 240 hours)  Resp panel by RT-PCR (RSV, Flu A&B, Covid) Anterior Nasal Swab     Status: None   Collection Time: 10/17/23 10:41 PM   Specimen: Anterior Nasal Swab  Result Value Ref Range Status   SARS Coronavirus 2 by RT PCR NEGATIVE  NEGATIVE Final   Influenza A by PCR NEGATIVE NEGATIVE Final   Influenza B by PCR NEGATIVE NEGATIVE Final    Comment: (NOTE) The Xpert Xpress SARS-CoV-2/FLU/RSV plus assay is intended as an aid in the diagnosis of influenza from Nasopharyngeal swab specimens and should not be used as a sole basis for treatment. Nasal washings and aspirates are unacceptable for Xpert Xpress SARS-CoV-2/FLU/RSV testing.  Fact Sheet for Patients: BloggerCourse.com  Fact Sheet for Healthcare Providers: SeriousBroker.it  This test is not yet approved or cleared by the [place] FDA and has been authorized for detection and/or diagnosis of SARS-CoV-2 by FDA under an Emergency Use Authorization (EUA). This EUA will remain in effect (meaning this test can be used) for the duration of the COVID-19 declaration under Section 564(b)(1) of the Act, 21 U.S.C. section 360bbb-3(b)(1), unless the  authorization is terminated or revoked.     Resp Syncytial Virus by PCR NEGATIVE NEGATIVE Final    Comment: (NOTE) Fact Sheet for Patients: BloggerCourse.com  Fact Sheet for Healthcare Providers: SeriousBroker.it  This test is not yet approved or cleared by the [place] FDA and has been authorized for detection and/or diagnosis of SARS-CoV-2 by FDA under an Emergency Use Authorization (EUA). This EUA will remain in effect (meaning this test can be used) for the duration of the COVID-19 declaration under Section 564(b)(1) of the Act, 21 U.S.C. section 360bbb-3(b)(1), unless the authorization is terminated or revoked.  Performed at [facility], [address] [place], [place] [address]      Medications:    amLODipine  10 mg Oral Daily   busPIRone  7.5 mg Oral BID   donepezil  10 mg Oral Daily   labetalol  300 mg Oral BID   memantine  5 mg Oral BID   pantoprazole  40 mg Oral BID   rosuvastatin  20 mg Oral QPM   sertraline  100 mg Oral Daily   Continuous Infusions:      LOS: 3 days   Marinda Elk  [facility]  10/20/2023, 10:06 AM

## 2023-10-20 NOTE — Plan of Care (Signed)

## 2023-10-20 NOTE — Plan of Care (Signed)
 Patient tolerating dysphagia diet. No BM on shift and negative for passing gas. No pain.  Problem: Education: Goal: Knowledge of General Education information will improve Description: Including pain rating scale, medication(s)/side effects and non-pharmacologic comfort measures 10/20/2023 1805 by Arlean Hopping, RN Outcome: Progressing 10/20/2023 1804 by Arlean Hopping, RN Outcome: Progressing   Problem: Health Behavior/Discharge Planning: Goal: Ability to manage health-related needs will improve 10/20/2023 1805 by Arlean Hopping, RN Outcome: Progressing 10/20/2023 1804 by Arlean Hopping, RN Outcome: Progressing   Problem: Clinical Measurements: Goal: Ability to maintain clinical measurements within normal limits will improve 10/20/2023 1805 by Arlean Hopping, RN Outcome: Progressing 10/20/2023 1804 by Arlean Hopping, RN Outcome: Progressing Goal: Will remain free from infection 10/20/2023 1805 by Arlean Hopping, RN Outcome: Progressing 10/20/2023 1804 by Arlean Hopping, RN Outcome: Progressing   Problem: Nutrition: Goal: Adequate nutrition will be maintained Outcome: Progressing   Problem: Coping: Goal: Level of anxiety will decrease Outcome: Progressing

## 2023-10-21 DIAGNOSIS — K56609 Unspecified intestinal obstruction, unspecified as to partial versus complete obstruction: Secondary | ICD-10-CM | POA: Diagnosis not present

## 2023-10-21 DIAGNOSIS — R112 Nausea with vomiting, unspecified: Secondary | ICD-10-CM

## 2023-10-21 DIAGNOSIS — R1084 Generalized abdominal pain: Secondary | ICD-10-CM

## 2023-10-21 LAB — BASIC METABOLIC PANEL
Anion gap: 13 (ref 5–15)
BUN: 24 mg/dL — ABNORMAL HIGH (ref 8–23)
CO2: 20 mmol/L — ABNORMAL LOW (ref 22–32)
Calcium: 9 mg/dL (ref 8.9–10.3)
Chloride: 107 mmol/L (ref 98–111)
Creatinine, Ser: 1.35 mg/dL — ABNORMAL HIGH (ref 0.44–1.00)
GFR, Estimated: 39 mL/min — ABNORMAL LOW (ref >60)
Glucose, Bld: 101 mg/dL — ABNORMAL HIGH (ref 70–99)
Potassium: 3.5 mmol/L (ref 3.5–5.1)
Sodium: 140 mmol/L (ref 135–145)

## 2023-10-21 MED ORDER — DOCUSATE SODIUM 100 MG PO CAPS
100.0000 mg | ORAL_CAPSULE | Freq: Two times a day (BID) | ORAL | Status: DC
  Administered 2023-10-21: 100 mg via ORAL
  Filled 2023-10-21: qty 1

## 2023-10-21 MED ORDER — SORBITOL 70 % SOLN
30.0000 mL | Freq: Once | Status: AC
  Administered 2023-10-21: 30 mL via ORAL
  Filled 2023-10-21: qty 30

## 2023-10-21 MED ORDER — POLYETHYLENE GLYCOL 3350 17 G PO PACK
17.0000 g | PACK | Freq: Every day | ORAL | Status: DC
  Administered 2023-10-21: 17 g via ORAL
  Filled 2023-10-21: qty 1

## 2023-10-21 MED ORDER — BISACODYL 10 MG RE SUPP
10.0000 mg | Freq: Once | RECTAL | Status: AC
  Administered 2023-10-21: 10 mg via RECTAL
  Filled 2023-10-21: qty 1

## 2023-10-21 NOTE — TOC Transition Note (Addendum)
 Transition of Care [facility]) - Discharge Note   Patient Details  Name: Alyssa Fernandez MRN: [id number] Date of Birth: 01-01-1941  [facility] CM/SW Contact:  Deatra Robinson, [place] Phone Number: 10/21/2023, 10:26 AM   Clinical Narrative: Pt for dc back to [facility] today. Spoke to [place] at [facility] who confirmed pt able to return and requested dc summary be sent with pt. RN provided with number for report. DC summary placed in dc packet along with AVS, no new medication orders. Dtr at bedside and will transport. SW signing off at dc.   Dellie Burns, MSW, LCSW [phone] (coverage)        Final next level of care: Assisted Living Barriers to Discharge: Barriers Resolved   Patient Goals and CMS Choice Patient states their goals for this hospitalization and ongoing recovery are:: return to ALF          Discharge Placement                Patient to be transferred to facility by: Dtr Name of family member notified: Mary/dtr Patient and family notified of of transfer: 10/21/23  Discharge Plan and Services Additional resources added to the After Visit Summary for       Post Acute Care Choice:  (return to ALF)                               Social Drivers of Health (SDOH) Interventions SDOH Screenings   Food Insecurity: No Food Insecurity (10/18/2023)  Housing: Low Risk  (10/18/2023)  Transportation Needs: No Transportation Needs (10/18/2023)  Utilities: Not At Risk (10/18/2023)  Alcohol Screen: Low Risk  (10/26/2021)  Depression (PHQ2-9): Low Risk  (04/18/2023)  Financial Resource Strain: Patient Declined (04/18/2023)  Physical Activity: Insufficiently Active (04/18/2023)  Social Connections: Unknown (10/18/2023)  Stress: Stress Concern Present (04/18/2023)  Tobacco Use: Low Risk  (10/18/2023)  Health Literacy: Inadequate Health Literacy (04/18/2023)     Readmission Risk Interventions    07/27/2022   10:56 AM  Readmission Risk  Prevention Plan  Transportation Screening Complete  PCP or Specialist Appt within 5-7 Days Complete  Home Care Screening Complete  Medication Review (RN CM) Complete

## 2023-10-21 NOTE — Discharge Summary (Signed)
 Physician Discharge Summary  Alyssa Fernandez:[id number] DOB: 19-Jul-1941 DOA: 10/17/2023  PCP: Corwin Levins, MD  Admit date: 10/17/2023 Discharge date: 10/21/2023  Admitted From: Home Disposition:  Home  Recommendations for Outpatient Follow-up:  Follow up with PCP in 1-2 weeks Please obtain BMP/CBC in one week   Home Health:No Equipment/Devices:None  Discharge Condition:Stable CODE STATUS:Full Diet recommendation: Heart Healthy   Brief/Interim Summary:  83 y.o. female past medical history significant for essential hypertension, chronic kidney see stage III, dementia hyperlipidemia has been having abdominal pain for about 5 days prior to admission.  Which have progressively gotten worse CT scan of the abdomen and pelvis shows small bowel obstruction General Surgery was consulted.   Discharge Diagnoses:  Principal Problem:   SBO (small bowel obstruction) (HCC) Active Problems:   Hypothyroidism   Hyperlipidemia   Essential hypertension   GERD   Mild dementia, concerns for Alzheimer's disease   Renal failure (ARF), acute on chronic (HCC)  Small bowel obstruction: NG tube was placed, surgery was consulted recommended conservative management. She was able to tolerate diet NG tube was removed. Had a bowel movement.  Atypical chest pain: Cardiac biomarkers were negative twelve-lead EKG showed no evidence of ischemia. She was given simethicone and her chest pain resolved likely gas.  Essential hypertension: No changes were made.  Resume home medications.  Acute kidney injury on chronic kidney disease stage IIIa: Likely hemodynamically mediated in the setting of nausea vomiting ARB and diuretics. This were held her creatinine returned to baseline.  Hypokalemia: Repleted now resolved.  Hypothyroidism: Continue Synthroid no changes made.  History of dementia: No changes made.  History of depression: Continue Zoloft.  Discharge Instructions  Discharge Instructions      Diet - low sodium heart healthy   Complete by: As directed    Increase activity slowly   Complete by: As directed       Allergies as of 10/21/2023       Reactions   Codeine Swelling   Shellfish Allergy Swelling   Throat swells   Sulfonamide Derivatives Swelling   Zocor [simvastatin] Other (See Comments)   Cognitive decrease        Medication List     STOP taking these medications    loperamide 2 MG tablet Commonly known as: IMODIUM A-D   Melatonin 10 MG Tabs   METAMUCIL FIBER PO   traZODone 100 MG tablet Commonly known as: DESYREL       TAKE these medications    acetaminophen 325 MG tablet Commonly known as: TYLENOL Take 650 mg by mouth every 6 (six) hours as needed (pain).   amLODipine 5 MG tablet Commonly known as: NORVASC Take 5 mg by mouth daily.   busPIRone 7.5 MG tablet Commonly known as: BUSPAR Take 7.5 mg by mouth 2 (two) times daily.   cyanocobalamin 1000 MCG tablet Commonly known as: VITAMIN B12 Take 1 tablet (1,000 mcg total) by mouth daily.   diclofenac Sodium 1 % Gel Commonly known as: VOLTAREN Apply 2 g topically 4 (four) times daily.   donepezil 10 MG tablet Commonly known as: ARICEPT Take 1 tablet (10 mg total) by mouth daily.   ferrous sulfate 325 (65 FE) MG tablet Take 1 tablet (325 mg total) by mouth daily.   FIBER PO Take 2 capsules by mouth 3 (three) times daily.   labetalol 300 MG tablet Commonly known as: NORMODYNE Take 300 mg by mouth 2 (two) times daily.   memantine 5 MG tablet Commonly known  as: NAMENDA Take 1 tablet (5 mg at night) for 2 weeks, then increase to 1 tablet (5 mg) twice a day What changed:  how much to take how to take this when to take this additional instructions   pantoprazole 40 MG tablet Commonly known as: PROTONIX Take 40 mg by mouth daily.   potassium chloride 10 MEQ tablet Commonly known as: KLOR-CON Take 10 mEq by mouth daily.   rosuvastatin 20 MG tablet Commonly known as:  CRESTOR Take 20 mg by mouth every evening.   sertraline 100 MG tablet Commonly known as: ZOLOFT 1 tab by mouth once daily   spironolactone 25 MG tablet Commonly known as: ALDACTONE Take 25 mg by mouth daily.   telmisartan 80 MG tablet Commonly known as: MICARDIS Take 1 tablet (80 mg total) by mouth daily.        Allergies  Allergen Reactions   Codeine Swelling   Shellfish Allergy Swelling    Throat swells   Sulfonamide Derivatives Swelling   Zocor [Simvastatin] Other (See Comments)    Cognitive decrease    Consultations: General Surgery   Procedures/Studies: DG Abd Portable 1V-Small Bowel Obstruction Protocol-initial, 8 hr delay Result Date: 10/18/2023 CLINICAL DATA:  Small-bowel obstruction. EXAM: PORTABLE ABDOMEN - 1 VIEW COMPARISON:  CT scan abdomen and pelvis from yesterday. FINDINGS: The bowel gas pattern is non-obstructive. There is paucity of bowel gas. Positive oral contrast noted in the right hemicolon thus extruding complete obstruction. No evidence of pneumoperitoneum, within the limitations of a supine film. No acute osseous abnormalities. The soft tissues are within normal limits. Surgical changes, devices, tubes and lines: There are surgical clips in the right upper quadrant, typical of a previous cholecystectomy. IMPRESSION: *Nonobstructive bowel gas pattern. Electronically Signed   By: Jules Schick M.D.   On: 10/18/2023 13:28   CT ABDOMEN PELVIS WO CONTRAST Result Date: 10/17/2023 CLINICAL DATA:  Fever, diarrhea, abdominal pain EXAM: CT ABDOMEN AND PELVIS WITHOUT CONTRAST TECHNIQUE: Multidetector CT imaging of the abdomen and pelvis was performed following the standard protocol without IV contrast. Unenhanced CT was performed per clinician order. Lack of IV contrast limits sensitivity and specificity, especially for evaluation of abdominal/pelvic solid viscera. RADIATION DOSE REDUCTION: This exam was performed according to the departmental dose-optimization  program which includes automated exposure control, adjustment of the mA and/or kV according to patient size and/or use of iterative reconstruction technique. COMPARISON:  04/13/2017 FINDINGS: Lower chest: No acute pleural or parenchymal lung disease. Hepatobiliary: Numerous hepatic cysts are again noted. Otherwise unremarkable unenhanced appearance of the liver. Prior cholecystectomy. No biliary duct dilation. Pancreas: Unremarkable unenhanced appearance. Spleen: Unremarkable unenhanced appearance. Adrenals/Urinary Tract: Innumerable renal cysts are again identified consistent with polycystic kidney disease. No specific imaging follow-up is recommended. No urinary tract calculi or obstruction. The adrenals are stable. Bladder is decompressed, limiting its evaluation. Stomach/Bowel: Postsurgical changes from sigmoid colon resection and reanastomosis. There is diffuse small bowel dilatation measuring up to 2.5 cm in diameter, with scattered gas fluid levels. Exact transition point is not well visualized, but the distal jejunum and ileum within the lower pelvis are decompressed. Findings are consistent with small-bowel obstruction. No bowel wall thickening or inflammatory change. Small hiatal hernia. Vascular/Lymphatic: Aortic atherosclerosis. No enlarged abdominal or pelvic lymph nodes. Reproductive: Status post hysterectomy. No adnexal masses. Other: No free fluid or free intraperitoneal gas. No abdominal wall hernia. Musculoskeletal: No acute or destructive bony abnormalities. Chronic appearing compression deformity within the superior endplate of the L4 vertebral body, with approximately 50%  loss of height. Reconstructed images demonstrate no additional findings. IMPRESSION: 1. Diffuse small bowel dilatation and scattered gas fluid levels consistent with small-bowel obstruction. The exact transition point is not identified, but distal loops of jejunum and ileum are decompressed within the pelvis. Continued  radiographic follow-up recommended. 2. Hepatic and renal cysts again noted, compatible with history of polycystic kidney disease. No specific imaging follow-up recommended. 3.  Aortic Atherosclerosis (ICD10-I70.0). 4. Hiatal hernia. Electronically Signed   By: Sharlet Salina M.D.   On: 10/17/2023 21:42    Subjective: No complain  Discharge Exam: Vitals:   10/21/23 0805 10/21/23 0807  BP: 106/66 106/66  Pulse: 63 63  Resp: 16   Temp: 98.3 F (36.8 C)   SpO2: 97%    Vitals:   10/20/23 2040 10/21/23 0521 10/21/23 0805 10/21/23 0807  BP: 124/83 131/62 106/66 106/66  Pulse: 63 (!) 53 63 63  Resp: 19 19 16    Temp: 97.7 F (36.5 C) 98 F (36.7 C) 98.3 F (36.8 C)   TempSrc:  Oral    SpO2: 95% 93% 97%     General: Pt is alert, awake, not in acute distress Cardiovascular: RRR, S1/S2 +, no rubs, no gallops Respiratory: CTA bilaterally, no wheezing, no rhonchi Abdominal: Soft, NT, ND, bowel sounds + Extremities: no edema, no cyanosis    The results of significant diagnostics from this hospitalization (including imaging, microbiology, ancillary and laboratory) are listed below for reference.     Microbiology: Recent Results (from the past 240 hours)  Resp panel by RT-PCR (RSV, Flu A&B, Covid) Anterior Nasal Swab     Status: None   Collection Time: 10/17/23 10:41 PM   Specimen: Anterior Nasal Swab  Result Value Ref Range Status   SARS Coronavirus 2 by RT PCR NEGATIVE NEGATIVE Final   Influenza A by PCR NEGATIVE NEGATIVE Final   Influenza B by PCR NEGATIVE NEGATIVE Final    Comment: (NOTE) The Xpert Xpress SARS-CoV-2/FLU/RSV plus assay is intended as an aid in the diagnosis of influenza from Nasopharyngeal swab specimens and should not be used as a sole basis for treatment. Nasal washings and aspirates are unacceptable for Xpert Xpress SARS-CoV-2/FLU/RSV testing.  Fact Sheet for Patients: BloggerCourse.com  Fact Sheet for Healthcare  Providers: SeriousBroker.it  This test is not yet approved or cleared by the [place] FDA and has been authorized for detection and/or diagnosis of SARS-CoV-2 by FDA under an Emergency Use Authorization (EUA). This EUA will remain in effect (meaning this test can be used) for the duration of the COVID-19 declaration under Section 564(b)(1) of the Act, 21 U.S.C. section 360bbb-3(b)(1), unless the authorization is terminated or revoked.     Resp Syncytial Virus by PCR NEGATIVE NEGATIVE Final    Comment: (NOTE) Fact Sheet for Patients: BloggerCourse.com  Fact Sheet for Healthcare Providers: SeriousBroker.it  This test is not yet approved or cleared by the [place] FDA and has been authorized for detection and/or diagnosis of SARS-CoV-2 by FDA under an Emergency Use Authorization (EUA). This EUA will remain in effect (meaning this test can be used) for the duration of the COVID-19 declaration under Section 564(b)(1) of the Act, 21 U.S.C. section 360bbb-3(b)(1), unless the authorization is terminated or revoked.  Performed at [facility], [address], [place], [place] [address]      Labs: BNP (last 3 results) No results for input(s): "BNP" in the last 8760 hours. Basic Metabolic Panel: Recent Labs  Lab 10/17/23 1732 10/18/23 0443 10/19/23 0458 10/21/23  0421  NA 138 140 138 140  K 4.3 3.3* 4.7 3.5  CL 106 108 109 107  CO2 21* 17* 20* 20*  GLUCOSE 134* 149* 95 101*  BUN 37* 35* 38* 24*  CREATININE 1.82* 1.56* 1.55* 1.35*  CALCIUM 9.4 8.1* 8.5* 9.0   Liver Function Tests: Recent Labs  Lab 10/17/23 1732  AST 18  ALT 15  ALKPHOS 47  BILITOT 1.2  PROT 6.8  ALBUMIN 4.2   Recent Labs  Lab 10/17/23 1732  LIPASE 23   No results for input(s): "AMMONIA" in the last 168 hours. CBC: Recent Labs  Lab 10/17/23 1732 10/18/23 0443  WBC 9.1 6.2  HGB 14.0 11.5*  HCT 42.2  34.8*  MCV 93.4 94.6  PLT 179 137*   Cardiac Enzymes: No results for input(s): "CKTOTAL", "CKMB", "CKMBINDEX", "TROPONINI" in the last 168 hours. BNP: Invalid input(s): "POCBNP" CBG: No results for input(s): "GLUCAP" in the last 168 hours. D-Dimer No results for input(s): "DDIMER" in the last 72 hours. Hgb A1c No results for input(s): "HGBA1C" in the last 72 hours. Lipid Profile No results for input(s): "CHOL", "HDL", "LDLCALC", "TRIG", "CHOLHDL", "LDLDIRECT" in the last 72 hours. Thyroid function studies No results for input(s): "TSH", "T4TOTAL", "T3FREE", "THYROIDAB" in the last 72 hours.  Invalid input(s): "FREET3" Anemia work up No results for input(s): "VITAMINB12", "FOLATE", "FERRITIN", "TIBC", "IRON", "RETICCTPCT" in the last 72 hours. Urinalysis    Component Value Date/Time   COLORURINE YELLOW 04/18/2023 1231   APPEARANCEUR CLEAR 04/18/2023 1231   LABSPEC 1.020 04/18/2023 1231   PHURINE 6.0 04/18/2023 1231   GLUCOSEU NEGATIVE 04/18/2023 1231   HGBUR NEGATIVE 04/18/2023 1231   HGBUR negative 01/31/2010 1644   BILIRUBINUR NEGATIVE 04/18/2023 1231   BILIRUBINUR n 04/12/2011 1413   KETONESUR NEGATIVE 04/18/2023 1231   PROTEINUR 100 (A) 12/22/2019 1014   UROBILINOGEN 0.2 04/18/2023 1231   NITRITE NEGATIVE 04/18/2023 1231   LEUKOCYTESUR NEGATIVE 04/18/2023 1231   Sepsis Labs Recent Labs  Lab 10/17/23 1732 10/18/23 0443  WBC 9.1 6.2   Microbiology Recent Results (from the past 240 hours)  Resp panel by RT-PCR (RSV, Flu A&B, Covid) Anterior Nasal Swab     Status: None   Collection Time: 10/17/23 10:41 PM   Specimen: Anterior Nasal Swab  Result Value Ref Range Status   SARS Coronavirus 2 by RT PCR NEGATIVE NEGATIVE Final   Influenza A by PCR NEGATIVE NEGATIVE Final   Influenza B by PCR NEGATIVE NEGATIVE Final    Comment: (NOTE) The Xpert Xpress SARS-CoV-2/FLU/RSV plus assay is intended as an aid in the diagnosis of influenza from Nasopharyngeal swab specimens  and should not be used as a sole basis for treatment. Nasal washings and aspirates are unacceptable for Xpert Xpress SARS-CoV-2/FLU/RSV testing.  Fact Sheet for Patients: BloggerCourse.com  Fact Sheet for Healthcare Providers: SeriousBroker.it  This test is not yet approved or cleared by the [place] FDA and has been authorized for detection and/or diagnosis of SARS-CoV-2 by FDA under an Emergency Use Authorization (EUA). This EUA will remain in effect (meaning this test can be used) for the duration of the COVID-19 declaration under Section 564(b)(1) of the Act, 21 U.S.C. section 360bbb-3(b)(1), unless the authorization is terminated or revoked.     Resp Syncytial Virus by PCR NEGATIVE NEGATIVE Final    Comment: (NOTE) Fact Sheet for Patients: BloggerCourse.com  Fact Sheet for Healthcare Providers: SeriousBroker.it  This test is not yet approved or cleared by the [place] FDA and has been  authorized for detection and/or diagnosis of SARS-CoV-2 by FDA under an Emergency Use Authorization (EUA). This EUA will remain in effect (meaning this test can be used) for the duration of the COVID-19 declaration under Section 564(b)(1) of the Act, 21 U.S.C. section 360bbb-3(b)(1), unless the authorization is terminated or revoked.  Performed at [facility], [address], [place], [place] [address]      Time coordinating discharge: Over 30 minutes  SIGNED:   Marinda Elk, MD  [facility] 10/21/2023, 9:47 AM Pager   If 7PM-7AM, please contact night-coverage [email or web address] Password [email or web address]

## 2023-10-21 NOTE — Progress Notes (Signed)
 Report given to nurse Marchelle Folks at ALF.

## 2023-10-21 NOTE — Progress Notes (Signed)
   Progress Note     Subjective: Tolerating soft diet but denies bowel movement or flatus today and can't remember last BM here. She states she has BM daily at home and does not want to go home until she has a BM. No pain/n/v A&Ox3 Objective: Vital signs in last 24 hours: Temp:  [97.7 F (36.5 C)-98.4 F (36.9 C)] 98.3 F (36.8 C) (03/02 0805) Pulse Rate:  [53-64] 63 (03/02 0807) Resp:  [16-19] 16 (03/02 0805) BP: (90-131)/(54-83) 106/66 (03/02 0807) SpO2:  [93 %-98 %] 97 % (03/02 0805) Last BM Date : 10/18/23  Intake/Output from previous day: 03/01 0701 - 03/02 0700 In: 120 [P.O.:120] Out: -  Intake/Output this shift: No intake/output data recorded.  PE: Gen: female, NAD Abdomen soft, nontender, nondistended   Lab Results:  No results for input(s): "WBC", "HGB", "HCT", "PLT" in the last 72 hours.  BMET Recent Labs    10/19/23 0458 10/21/23 0421  NA 138 140  K 4.7 3.5  CL 109 107  CO2 20* 20*  GLUCOSE 95 101*  BUN 38* 24*  CREATININE 1.55* 1.35*  CALCIUM 8.5* 9.0   PT/INR No results for input(s): "LABPROT", "INR" in the last 72 hours. CMP     Component Value Date/Time   NA 140 10/21/2023 0421   K 3.5 10/21/2023 0421   CL 107 10/21/2023 0421   CO2 20 (L) 10/21/2023 0421   GLUCOSE 101 (H) 10/21/2023 0421   BUN 24 (H) 10/21/2023 0421   CREATININE 1.35 (H) 10/21/2023 0421   CREATININE 0.81 06/21/2012 1217   CALCIUM 9.0 10/21/2023 0421   PROT 6.8 10/17/2023 1732   ALBUMIN 4.2 10/17/2023 1732   AST 18 10/17/2023 1732   ALT 15 10/17/2023 1732   ALKPHOS 47 10/17/2023 1732   BILITOT 1.2 10/17/2023 1732   GFRNONAA 39 (L) 10/21/2023 0421   GFRNONAA 72 06/21/2012 1217   GFRAA >60 04/16/2017 0535   GFRAA 83 06/21/2012 1217   Lipase     Component Value Date/Time   LIPASE 23 10/17/2023 1732       Studies/Results: No results found.   Anti-infectives: Anti-infectives (From admission, onward)    None        Assessment/Plan  SBO  -  contrast in colon on abd xray 2/27 - tolerating dys 3 diet but feels like she needs to have BM. Increased bowel regimen. Can discharge today if she continues to tolerate diet   FEN: dys 3 ID: none VTE: none  I reviewed hospitalist notes, last 24 h vitals and pain scores, last 48 h intake and output, last 24 h labs and trends, and last 24 h imaging results.   Eric Form, [facility] 10/21/2023, 8:32 AM Please see Amion for pager number during day hours 7:00am-4:30pm

## 2023-12-31 ENCOUNTER — Ambulatory Visit: Payer: PPO | Admitting: Physician Assistant

## 2024-01-02 ENCOUNTER — Ambulatory Visit: Payer: PPO | Admitting: Physician Assistant

## 2024-01-02 ENCOUNTER — Other Ambulatory Visit: Payer: Self-pay

## 2024-01-02 ENCOUNTER — Encounter: Payer: Self-pay | Admitting: Physician Assistant

## 2024-01-02 VITALS — BP 107/64 | HR 69 | Resp 20 | Ht 64.0 in | Wt 136.0 lb

## 2024-01-02 DIAGNOSIS — F02A4 Dementia in other diseases classified elsewhere, mild, with anxiety: Secondary | ICD-10-CM

## 2024-01-02 DIAGNOSIS — G301 Alzheimer's disease with late onset: Secondary | ICD-10-CM | POA: Diagnosis not present

## 2024-01-02 MED ORDER — MEMANTINE HCL 5 MG PO TABS: 5.0000 mg | ORAL_TABLET | Freq: Two times a day (BID) | ORAL | 3 refills | Status: DC

## 2024-01-02 MED ORDER — DONEPEZIL HCL 10 MG PO TABS: 10.0000 mg | ORAL_TABLET | Freq: Every day | ORAL | 3 refills | Status: DC

## 2024-01-02 NOTE — Patient Instructions (Signed)
 It was a pleasure to see you today at our office.   Recommendations:  Continue donepezil  10 mg daily  Continue  Memantine  5 mg  twice a day  Follow up 6 months   For assessment of decision of mental capacity and competency:  Call Dr. Laverne Potter, geriatric psychiatrist at [phone] Counseling regarding caregiver distress, including caregiver depression, anxiety and issues regarding community resources, adult day care programs, adult living facilities, or memory care questions:  please contact your  Primary Doctor's Social Worker  Whom to call: Memory  decline, memory medications: Call our office [phone]  For psychiatric meds, mood meds: Please have your primary care physician manage these medications.  If you have any severe symptoms of a stroke, or other severe issues such as confusion,severe chills or fever, etc call 911 or go to the ER as you may need to be evaluated further   [email or web address]   RECOMMENDATIONS FOR ALL PATIENTS WITH MEMORY PROBLEMS: 1. Continue to exercise (Recommend 30 minutes of walking everyday, or 3 hours every week) 2. Increase social interactions - continue going to [place] and enjoy social gatherings with friends and family 3. Eat healthy, avoid fried foods and eat more fruits and vegetables 4. Maintain adequate blood pressure, blood sugar, and blood cholesterol level. Reducing the risk of stroke and cardiovascular disease also helps promoting better memory. 5. Avoid stressful situations. Live a simple life and avoid aggravations. Organize your time and prepare for the next day in anticipation. 6. Sleep well, avoid any interruptions of sleep and avoid any distractions in the bedroom that may interfere with adequate sleep quality 7. Avoid sugar, avoid sweets as there is a strong link between excessive sugar intake, diabetes, and cognitive impairment We discussed the Mediterranean diet, which has  been shown to help patients reduce the risk of progressive memory disorders and reduces cardiovascular risk. This includes eating fish, eat fruits and green leafy vegetables, nuts like almonds and hazelnuts, walnuts, and also use olive oil. Avoid fast foods and fried foods as much as possible. Avoid sweets and sugar as sugar use has been linked to worsening of memory function.  There is always a concern of gradual progression of memory problems. If this is the case, then we may need to adjust level of care according to patient needs. Support, both to the patient and caregiver, should then be put into place.    The Alzheimer's Association is here all day, every day for people facing Alzheimer's disease through our free 24/7 Helpline: 7241310697. The Helpline provides reliable information and support to all those who need assistance, such as individuals living with memory loss, Alzheimer's or other dementia, caregivers, health care professionals and the public.  Our highly trained and knowledgeable staff can help you with: Understanding memory loss, dementia and Alzheimer's  Medications and other treatment options  General information about aging and brain health  Skills to provide quality care and to find the best care from professionals  Legal, financial and living-arrangement decisions Our Helpline also features: Confidential care consultation provided by master's level clinicians who can help with decision-making support, crisis assistance and education on issues families face every day  Help in a caller's preferred language using our translation service that features more than 200 languages and dialects  Referrals to local community programs, services and ongoing support     FALL PRECAUTIONS: Be cautious when walking. Scan the area for obstacles that may increase the risk of trips and falls. When getting up in the  mornings, sit up at the edge of the bed for a few minutes before getting out  of bed. Consider elevating the bed at the head end to avoid drop of blood pressure when getting up. Walk always in a well-lit room (use night lights in the walls). Avoid area rugs or power cords from appliances in the middle of the walkways. Use a walker or a cane if necessary and consider physical therapy for balance exercise. Get your eyesight checked regularly.  FINANCIAL OVERSIGHT: Supervision, especially oversight when making financial decisions or transactions is also recommended.  HOME SAFETY: Consider the safety of the kitchen when operating appliances like stoves, microwave oven, and blender. Consider having supervision and share cooking responsibilities until no longer able to participate in those. Accidents with firearms and other hazards in the house should be identified and addressed as well.   ABILITY TO BE LEFT ALONE: If patient is unable to contact 911 operator, consider using LifeLine, or when the need is there, arrange for someone to stay with patients. Smoking is a fire hazard, consider supervision or cessation. Risk of wandering should be assessed by caregiver and if detected at any point, supervision and safe proof recommendations should be instituted.  MEDICATION SUPERVISION: Inability to self-administer medication needs to be constantly addressed. Implement a mechanism to ensure safe administration of the medications.          Mediterranean Diet A Mediterranean diet refers to food and lifestyle choices that are based on the traditions of countries located on the [employer]. This way of eating has been shown to help prevent certain conditions and improve outcomes for people who have chronic diseases, like kidney disease and heart disease. What are tips for following this plan? Lifestyle  Cook and eat meals together with your family, when possible. Drink enough fluid to keep your urine clear or pale yellow. Be physically active every day. This includes: Aerobic  exercise like running or swimming. Leisure activities like gardening, walking, or housework. Get 7-8 hours of sleep each night. If recommended by your health care provider, drink red wine in moderation. This means 1 glass a day for nonpregnant women and 2 glasses a day for men. A glass of wine equals 5 oz (150 mL). Reading food labels  Check the serving size of packaged foods. For foods such as rice and pasta, the serving size refers to the amount of cooked product, not dry. Check the total fat in packaged foods. Avoid foods that have saturated fat or trans fats. Check the ingredients list for added sugars, such as corn syrup. Shopping  At the grocery store, buy most of your food from the areas near the walls of the store. This includes: Fresh fruits and vegetables (produce). Grains, beans, nuts, and seeds. Some of these may be available in unpackaged forms or large amounts (in bulk). Fresh seafood. Poultry and eggs. Low-fat dairy products. Buy whole ingredients instead of prepackaged foods. Buy fresh fruits and vegetables in-season from local farmers markets. Buy frozen fruits and vegetables in resealable bags. If you do not have access to quality fresh seafood, buy precooked frozen shrimp or canned fish, such as tuna, salmon, or sardines. Buy small amounts of raw or cooked vegetables, salads, or olives from the deli or salad bar at your store. Stock your pantry so you always have certain foods on hand, such as olive oil, canned tuna, canned tomatoes, rice, pasta, and beans. Cooking  Cook foods with extra-virgin olive oil instead of using butter or  other vegetable oils. Have meat as a side dish, and have vegetables or grains as your main dish. This means having meat in small portions or adding small amounts of meat to foods like pasta or stew. Use beans or vegetables instead of meat in common dishes like chili or lasagna. Experiment with different cooking methods. Try roasting or broiling  vegetables instead of steaming or sauteing them. Add frozen vegetables to soups, stews, pasta, or rice. Add nuts or seeds for added healthy fat at each meal. You can add these to yogurt, salads, or vegetable dishes. Marinate fish or vegetables using olive oil, lemon juice, garlic, and fresh herbs. Meal planning  Plan to eat 1 vegetarian meal one day each week. Try to work up to 2 vegetarian meals, if possible. Eat seafood 2 or more times a week. Have healthy snacks readily available, such as: Vegetable sticks with hummus. Greek yogurt. Fruit and nut trail mix. Eat balanced meals throughout the week. This includes: Fruit: 2-3 servings a day Vegetables: 4-5 servings a day Low-fat dairy: 2 servings a day Fish, poultry, or lean meat: 1 serving a day Beans and legumes: 2 or more servings a week Nuts and seeds: 1-2 servings a day Whole grains: 6-8 servings a day Extra-virgin olive oil: 3-4 servings a day Limit red meat and sweets to only a few servings a month What are my food choices? Mediterranean diet Recommended Grains: Whole-grain pasta. Folts rice. Bulgar wheat. Polenta. Couscous. Whole-wheat bread. Dwyane Glad. Vegetables: Artichokes. Beets. Broccoli. Cabbage. Carrots. Eggplant. Green beans. Chard. Kale. Spinach. Onions. Leeks. Peas. Squash. Tomatoes. Peppers. Radishes. Fruits: Apples. Apricots. Avocado. Berries. Bananas. Cherries. Dates. Figs. Grapes. Lemons. Melon. Oranges. Peaches. Plums. Pomegranate. Meats and other protein foods: Beans. Almonds. Sunflower seeds. Pine nuts. Peanuts. Cod. Salmon. Scallops. Shrimp. Tuna. Tilapia. Clams. Oysters. Eggs. Dairy: Low-fat milk. Cheese. Greek yogurt. Beverages: Water . Red wine. Herbal tea. Fats and oils: Extra virgin olive oil. Avocado oil. Grape seed oil. Sweets and desserts: [place] yogurt with honey. Baked apples. Poached pears. Trail mix. Seasoning and other foods: Basil. Cilantro. Coriander. Cumin. Mint. Parsley. Sage. Rosemary.  Tarragon. Garlic. Oregano. Thyme. Pepper. Balsalmic vinegar. Tahini. Hummus. Tomato sauce. Olives. Mushrooms. Limit these Grains: Prepackaged pasta or rice dishes. Prepackaged cereal with added sugar. Vegetables: Deep fried potatoes (french fries). Fruits: Fruit canned in syrup. Meats and other protein foods: Beef. Pork. Lamb. Poultry with skin. Hot dogs. Helene Loader. Dairy: Ice cream. Sour cream. Whole milk. Beverages: Juice. Sugar-sweetened soft drinks. Beer. Liquor and spirits. Fats and oils: Butter. Canola oil. Vegetable oil. Beef fat (tallow). Lard. Sweets and desserts: Cookies. Cakes. Pies. Candy. Seasoning and other foods: Mayonnaise. Premade sauces and marinades. The items listed may not be a complete list. Talk with your dietitian about what dietary choices are right for you. Summary The Mediterranean diet includes both food and lifestyle choices. Eat a variety of fresh fruits and vegetables, beans, nuts, seeds, and whole grains. Limit the amount of red meat and sweets that you eat. Talk with your health care provider about whether it is safe for you to drink red wine in moderation. This means 1 glass a day for nonpregnant women and 2 glasses a day for men. A glass of wine equals 5 oz (150 mL). This information is not intended to replace advice given to you by your health care provider. Make sure you discuss any questions you have with your health care provider. Document Released: 03/30/2016 Document Revised: 05/02/2016 Document Reviewed: 03/30/2016 Elsevier Interactive Patient Education  2017  [employer].

## 2024-01-02 NOTE — Progress Notes (Signed)
 Assessment/Plan:   Dementia likely due to Alzheimer's disease  Alyssa Fernandez is a very pleasant 83 y.o. RH female with a history of hypertension, hyperlipidemia, situational depression, anxiety, hypothyroidism, insomnia, narcolepsy, CKD, vitamin D  deficiency, arthritis, and a diagnosis of mild dementia likely due to Alzheimer's disease with anxiety per neuropsychological evaluation in April 2024 seen today in follow up for memory loss. Patient is currently on donepezil  10 mg daily per PCP and memantine  5 mg twice daily.  Memory remains stable.  She is able to participate on her ADLs without difficulty.  She no longer drives.    Follow up in  6 months. Continue donepezil  10 mg daily, side effects discussed Continue memantine  5 mg twice daily, side effects discussed Recommend good control of her cardiovascular risk factors Continue to control mood as per PCP     Subjective:    This patient is accompanied in the office by her daughter who supplements the history.  Previous records as well as any outside records available were reviewed prior to todays visit. Patient was last seen on 07/02/2023 with MMSE 26/30   Any changes in memory since last visit? " About the same".  She enjoys participating in the activities at assisted living, she has "a nice click  " but likes to hang out with her boyfriend.  She loves bingo and other social activities.  She continues to have some trouble understanding instructions on statements or remembering recent conversations and names.  She likes to write a journal and a calendar. repeats oneself?  Endorsed Disoriented when walking into a room?  Patient denies   Leaving objects?  May misplace things but not in unusual places  Wandering behavior?  denies   Any personality changes since last visit?  "She is very demanding now". Patient is offended by her daughter because she calls her at any given time, even when working, asking her for different things and  daughter has to set limits. Daughter suspects that her boyfriend is instigating a rift between patient and daughter, as she is turning on her children, threatening to remove the POA status from her.   Any worsening depression?:  Denies.   Hallucinations or paranoia?  Denies.   Seizures? denies    Any sleep changes?  Denies vivid dreams, REM behavior or sleepwalking   Sleep apnea?   Denies.   Any hygiene concerns? Denies.  Independent of bathing and dressing?  Endorsed  Does the patient needs help with medications?  Facility is in charge   Who is in charge of the finances?  Daughter is in charge     Any changes in appetite?  denies , went from size 8 to size 10    Patient have trouble swallowing? Denies.   Does the patient cook? No Any headaches?   denies   Chronic back pain  denies  Ambulates with difficulty?  Denies.  She walks up to 3000 steps a day Recent falls or head injuries? denies     Unilateral weakness, numbness or tingling? denies   Any tremors?  Denies   Any anosmia?  Denies   Any incontinence of urine?  Endorsed, wears diapers Any bowel dysfunction?   She had recent bowel obstruction "all good now".  She has a history of IBS     Patient lives [facility]  Does the patient drive? No longer drives     MRI of the brain from September 2023 personally reviewed No evidence of recent infarction, hemorrhage, or  mass.Mild to moderate chronic microvascular ischemic changes. Parenchymal volume loss without lobar predilection or disproportionate hippocampal atrophy.    History 03/29/2022  How long did patient have memory difficulties?  Daughter reports that she began showing signs of memory loss about 2 years ago, but has been worse over the last 3 or 4 months, when she has to write down appointments, names, and she has difficulty remembering recent conversations.   Patient lives with:Patient lives alone repeats oneself? Endorsed, for the last 2 years.  Disoriented when  walking into a room?  Patient denies   Leaving objects in unusual places?  Patient denies   Ambulates  with difficulty?   She walks about 1 mile a day, she is not as active as she used to be before the death of her husband.   Recent falls?  Patient denies   Any head injuries?  Patient denies   History of seizures?   Patient denies   Wandering behavior?  Patient denies   Patient drives?  Short distances  Any mood changes ?  Patient is dealing with grief/depression after her husband's death to COVID few years ago Hallucinations?  Patient denies   Paranoia?  Patient denies   Patient reports that sleeps well without vivid dreams, REM behavior or sleepwalking    History of sleep apnea?  Patient denies   Any hygiene concerns?  Patient denies   Independent of bathing and dressing?  Endorsed  Does the patient needs help with medications?  She is in charge of the medications Who is in charge of the finances?  Patient is in charge   Any changes in appetite?  Patient denies   Patient have trouble swallowing? Patient denies   Does the patient cook?  Patient denies   Any kitchen accidents such as leaving the stove on? Patient denies   Any headaches?  Patient denies   Double vision? Patient denies   Any focal numbness or tingling?  Patient denies   Chronic back pain Patient denies   Unilateral weakness?  Patient denies   Any tremors?  Patient denies   Any history of anosmia?  Patient denies   Any incontinence of urine?  Endorsed, wears depends  Any bowel dysfunction?   Hemorrhoids, sometimes lose stools  History of heavy alcohol intake?  Patient denies   History of heavy tobacco use?  Patient denies   Family history of dementia?  Patient denies.       Neuropsychological evaluation 12/19/2022 Briefly, results suggested severe impairment surrounding essentially all aspects of learning and memory. An additional isolated impairment was exhibited across a line orientation task. However, other  visuospatial tasks were appropriate. The etiology for her mild dementia presentation is somewhat unclear. However, I do have concerns for an underlying neurodegenerative illness, namely Alzheimer's disease. Across memory testing, Ms. Ryser did not benefit from repeated exposure to novel information and was fully amnestic (i.e., 0% retention) across all memory testing after a brief delay. Across recognition testing, she became frustrated due to poor performance and ultimately refused to complete one task. The other two similar tasks exhibited variable performances; however, she made comments surrounding guessing across her stronger performance, suggesting that this may be an inflation of true abilities. Overall, memory performances suggest rapid forgetting and an evolving and already quite severe storage impairment, both of which are the hallmark memory characteristics of this illness. Intact performances across the majority of non-memory cognitive abilities is certainly encouraging and would suggest that this disease process remains in early  stages should it truly be present.     PREVIOUS MEDICATIONS:   CURRENT MEDICATIONS:  Outpatient Encounter Medications as of 01/02/2024  Medication Sig   amLODipine  (NORVASC ) 5 MG tablet Take 5 mg by mouth daily.   diclofenac  Sodium (VOLTAREN ) 1 % GEL Apply 2 g topically 4 (four) times daily.   ferrous sulfate  325 (65 FE) MG tablet Take 1 tablet (325 mg total) by mouth daily.   pantoprazole  (PROTONIX ) 40 MG tablet Take 40 mg by mouth daily.   potassium chloride  (KLOR-CON ) 10 MEQ tablet Take 10 mEq by mouth daily.   rosuvastatin  (CRESTOR ) 20 MG tablet Take 20 mg by mouth every evening.   sertraline  (ZOLOFT ) 100 MG tablet 1 tab by mouth once daily   spironolactone (ALDACTONE) 25 MG tablet Take 25 mg by mouth daily.   telmisartan  (MICARDIS ) 80 MG tablet Take 1 tablet (80 mg total) by mouth daily.   vitamin B-12 (CYANOCOBALAMIN ) 1000 MCG tablet Take 1 tablet (1,000  mcg total) by mouth daily.   [DISCONTINUED] donepezil  (ARICEPT ) 10 MG tablet Take 1 tablet (10 mg total) by mouth daily.   [DISCONTINUED] memantine  (NAMENDA ) 5 MG tablet Take 1 tablet (5 mg at night) for 2 weeks, then increase to 1 tablet (5 mg) twice a day (Patient taking differently: Take 5 mg by mouth 2 (two) times daily.)   acetaminophen  (TYLENOL ) 325 MG tablet Take 650 mg by mouth every 6 (six) hours as needed (pain).   busPIRone  (BUSPAR ) 7.5 MG tablet Take 7.5 mg by mouth 2 (two) times daily. (Patient not taking: Reported on 01/02/2024)   donepezil  (ARICEPT ) 10 MG tablet Take 1 tablet (10 mg total) by mouth daily.   FIBER PO Take 2 capsules by mouth 3 (three) times daily.   labetalol  (NORMODYNE ) 300 MG tablet Take 300 mg by mouth 2 (two) times daily. (Patient not taking: Reported on 01/02/2024)   [DISCONTINUED] memantine  (NAMENDA ) 5 MG tablet Take 1 tablet (5 mg total) by mouth 2 (two) times daily.   No facility-administered encounter medications on file as of 01/02/2024.       07/02/2023   12:00 PM 03/29/2018    4:28 PM  MMSE - Mini Mental State Exam  Orientation to time 4 5  Orientation to Place 4 5  Registration 3 3  Attention/ Calculation 5 4  Recall 1 1  Language- name 2 objects 2 2  Language- repeat 1 1  Language- follow 3 step command 3 3  Language- read & follow direction 1 1  Write a sentence 1 1  Copy design 1 1  Total score 26 27      03/30/2022   12:00 PM  Montreal Cognitive Assessment   Visuospatial/ Executive (0/5) 4  Naming (0/3) 3  Attention: Read list of digits (0/2) 1  Attention: Read list of letters (0/1) 1  Attention: Serial 7 subtraction starting at 100 (0/3) 0  Language: Repeat phrase (0/2) 0  Language : Fluency (0/1) 0  Abstraction (0/2) 2  Delayed Recall (0/5) 1  Orientation (0/6) 4  Total 16  Adjusted Score (based on education) 16    Objective:     PHYSICAL EXAMINATION:    VITALS:   Vitals:   01/02/24 1409 01/02/24 1418  BP:  107/64   Pulse:  69  Resp:  20  SpO2:  97%  Weight:  136 lb (61.7 kg)  Height: 5\' 4"  (1.626 m)     GEN:  The patient appears stated age and is in NAD.  HEENT:  Normocephalic, atraumatic.   Neurological examination:  General: NAD, well-groomed, appears stated age. Orientation: The patient is alert. Oriented to person, place and not date Cranial nerves: There is good facial symmetry. Anxious appearing. The speech is fluent and clear. No aphasia or dysarthria. Fund of knowledge is appropriate. Recent and remote memory are impaired. Attention and concentration are reduced.  Able to name objects and repeat phrases.  Hearing is intact to conversational tone.  Sensation: Sensation is intact to light touch throughout Motor: Strength is at least antigravity x4. DTR's 2/4 in UE/LE     Movement examination: Tone: There is normal tone in the UE/LE Abnormal movements:  no tremor.  No myoclonus.  No asterixis.   Coordination:  There is no decremation with RAM's. Normal finger to nose  Gait and Station: The patient has no difficulty arising out of a deep-seated chair without the use of the hands. The patient's stride length is good.  Gait is cautious and narrow.    Thank you for allowing us  the opportunity to participate in the care of this nice patient. Please do not hesitate to contact us  for any questions or concerns.   Total time spent on today's visit was 33 minutes dedicated to this patient today, preparing to see patient, examining the patient, ordering tests and/or medications and counseling the patient, documenting clinical information in the EHR or other health record, independently interpreting results and communicating results to the patient/family, discussing treatment and goals, answering patient's questions and coordinating care.  Cc:  Roslyn Coombe, MD  Tex Filbert 01/02/2024 5:51 PM

## 2024-03-25 ENCOUNTER — Encounter: Payer: Self-pay | Admitting: Internal Medicine

## 2024-03-25 ENCOUNTER — Encounter: Payer: Self-pay | Admitting: Physician Assistant

## 2024-04-15 ENCOUNTER — Encounter: Payer: Self-pay | Admitting: Internal Medicine

## 2024-05-29 NOTE — Telephone Encounter (Signed)
 This has been printed out to be scanned into Pt chart.

## 2024-07-03 ENCOUNTER — Encounter: Payer: Self-pay | Admitting: Physician Assistant

## 2024-07-03 NOTE — Progress Notes (Incomplete)
 Assessment/Plan:   Dementia likely due to Alzheimer's disease  Alyssa Fernandez is a very pleasant 83 y.o. RH female with a history ofhypertension, hyperlipidemia, situational depression, anxiety, hypothyroidism, insomnia, narcolepsy, CKD, vitamin D  deficiency, arthritis, and a diagnosis of mild dementia likely due to Alzheimer's disease with anxiety per neuropsychological evaluation in April 2024 seen today in follow up for memory loss. Patient is currently on donepezil  10 mg daily per PCP and memantine  10 mg twice daily, tolerating well.  Memory is***.  Patient is able to participate on ADLs*** Mood is***    Follow up in   months. Continue donepezil  10 mg daily and memantine  10 mg twice daily, side effects discussed Recommend good control of her cardiovascular risk factors Continue to control mood as per PCP     Subjective:    This patient is accompanied in the office by her daughter who supplements the history.  Previous records as well as any outside records available were reviewed prior to todays visit. Patient was last seen on 01/02/2024 with last MMSE on 11 07/14/2025/30.***   Any changes in memory since last visit?   She enjoys participating in the activities at her residence, she has a nice click of friends , she also has a boyfriend.  She loves bingo and other activities.  She continues to have some trouble understanding instructions or statements or remembering recent conversations and names, likes to write a journal and a calendar.  LTM is fair. repeats oneself?  Endorsed Disoriented when walking into a room? Denies ***  Leaving objects?  May misplace things but not in unusual places***  Wandering behavior?  denies   Any personality changes since last visit?  Denies although she is very demanding .   Any worsening depression?:  Denies.   Hallucinations or paranoia?  Denies.   Seizures? denies    Any sleep changes?  Denies vivid dreams, REM behavior or sleepwalking    Sleep apnea?   Denies.   Any hygiene concerns? Denies.  Independent of bathing and dressing?  Endorsed  Does the patient needs help with medications?  Facility is in charge *** Who is in charge of the finances?  Daughter there is in charge   *** Any changes in appetite?  denies ***   Patient have trouble swallowing? Denies.   Does the patient cook? No Any headaches?   denies   Any vision changes?*** Chronic back pain  denies   Ambulates with difficulty? Denies.  She walks about 3000 steps a day.*** Recent falls or head injuries? Denies.     Unilateral weakness, numbness or tingling? denies   Any tremors?  Denies  *** Any anosmia?  Denies   Any incontinence of urine?  Endorsed, wears depends***  Any bowel dysfunction?   Denies      Patient lives at [facility]*** Does the patient drive? No longer drives ***  History 03/29/2022   How long did patient have memory difficulties?  Daughter reports that she began showing signs of memory loss about 2 years ago, but has been worse over the last 3 or 4 months, when she has to write down appointments, names, and she has difficulty remembering recent conversations.   Patient lives with:Patient lives alone repeats oneself? Endorsed, for the last 2 years.  Disoriented when walking into a room?  Patient denies   Leaving objects in unusual places?  Patient denies   Ambulates  with difficulty?   She walks about 1 mile a day,  she is not as active as she used to be before the death of her husband.   Recent falls?  Patient denies   Any head injuries?  Patient denies   History of seizures?   Patient denies   Wandering behavior?  Patient denies   Patient drives?  Short distances  Any mood changes ?  Patient is dealing with grief/depression after her husband's death to COVID few years ago Hallucinations?  Patient denies   Paranoia?  Patient denies   Patient reports that sleeps well without vivid dreams, REM behavior or sleepwalking     History of sleep apnea?  Patient denies   Any hygiene concerns?  Patient denies   Independent of bathing and dressing?  Endorsed  Does the patient needs help with medications?  She is in charge of the medications Who is in charge of the finances?  Patient is in charge   Any changes in appetite?  Patient denies   Patient have trouble swallowing? Patient denies   Does the patient cook?  Patient denies   Any kitchen accidents such as leaving the stove on? Patient denies   Any headaches?  Patient denies   Double vision? Patient denies   Any focal numbness or tingling?  Patient denies   Chronic back pain Patient denies   Unilateral weakness?  Patient denies   Any tremors?  Patient denies   Any history of anosmia?  Patient denies   Any incontinence of urine?  Endorsed, wears depends  Any bowel dysfunction?   Hemorrhoids, sometimes lose stools  History of heavy alcohol intake?  Patient denies   History of heavy tobacco use?  Patient denies   Family history of dementia?  Patient denies.         Neuropsychological evaluation 12/19/2022 Briefly, results suggested severe impairment surrounding essentially all aspects of learning and memory. An additional isolated impairment was exhibited across a line orientation task. However, other visuospatial tasks were appropriate. The etiology for her mild dementia presentation is somewhat unclear. However, I do have concerns for an underlying neurodegenerative illness, namely Alzheimer's disease. Across memory testing, Ms. Alyssa Fernandez did not benefit from repeated exposure to novel information and was fully amnestic (i.e., 0% retention) across all memory testing after a brief delay. Across recognition testing, she became frustrated due to poor performance and ultimately refused to complete one task. The other two similar tasks exhibited variable performances; however, she made comments surrounding guessing across her stronger performance, suggesting that this may  be an inflation of true abilities. Overall, memory performances suggest rapid forgetting and an evolving and already quite severe storage impairment, both of which are the hallmark memory characteristics of this illness. Intact performances across the majority of non-memory cognitive abilities is certainly encouraging and would suggest that this disease process remains in early stages should it truly be present.     MRI of the brain from September 2023 personally reviewed No evidence of recent infarction, hemorrhage, or mass.Mild to moderate chronic microvascular ischemic changes. Parenchymal volume loss without lobar predilection or disproportionate hippocampal atrophy.   PREVIOUS MEDICATIONS:   CURRENT MEDICATIONS:  Outpatient Encounter Medications as of 07/04/2024  Medication Sig   acetaminophen  (TYLENOL ) 325 MG tablet Take 650 mg by mouth every 6 (six) hours as needed (pain).   amLODipine  (NORVASC ) 5 MG tablet Take 5 mg by mouth daily.   busPIRone  (BUSPAR ) 7.5 MG tablet Take 7.5 mg by mouth 2 (two) times daily. (Patient not taking: Reported on 01/02/2024)   diclofenac  Sodium (  VOLTAREN ) 1 % GEL Apply 2 g topically 4 (four) times daily.   donepezil  (ARICEPT ) 10 MG tablet Take 1 tablet (10 mg total) by mouth daily.   ferrous sulfate  325 (65 FE) MG tablet Take 1 tablet (325 mg total) by mouth daily.   FIBER PO Take 2 capsules by mouth 3 (three) times daily.   labetalol  (NORMODYNE ) 300 MG tablet Take 300 mg by mouth 2 (two) times daily. (Patient not taking: Reported on 01/02/2024)   memantine  (NAMENDA ) 5 MG tablet Take 1 tablet (5 mg total) by mouth 2 (two) times daily.   pantoprazole  (PROTONIX ) 40 MG tablet Take 40 mg by mouth daily.   potassium chloride  (KLOR-CON ) 10 MEQ tablet Take 10 mEq by mouth daily.   rosuvastatin  (CRESTOR ) 20 MG tablet Take 20 mg by mouth every evening.   sertraline  (ZOLOFT ) 100 MG tablet 1 tab by mouth once daily   spironolactone (ALDACTONE) 25 MG tablet Take 25 mg by  mouth daily.   telmisartan  (MICARDIS ) 80 MG tablet Take 1 tablet (80 mg total) by mouth daily.   vitamin B-12 (CYANOCOBALAMIN ) 1000 MCG tablet Take 1 tablet (1,000 mcg total) by mouth daily.   No facility-administered encounter medications on file as of 07/04/2024.       07/02/2023   12:00 PM 03/29/2018    4:28 PM  MMSE - Mini Mental State Exam  Orientation to time 4 5  Orientation to Place 4 5  Registration 3 3  Attention/ Calculation 5 4  Recall 1 1  Language- name 2 objects 2 2  Language- repeat 1 1  Language- follow 3 step command 3 3  Language- read & follow direction 1 1  Write a sentence 1 1  Copy design 1 1  Total score 26 27      03/30/2022   12:00 PM  Montreal Cognitive Assessment   Visuospatial/ Executive (0/5) 4  Naming (0/3) 3  Attention: Read list of digits (0/2) 1  Attention: Read list of letters (0/1) 1  Attention: Serial 7 subtraction starting at 100 (0/3) 0  Language: Repeat phrase (0/2) 0  Language : Fluency (0/1) 0  Abstraction (0/2) 2  Delayed Recall (0/5) 1  Orientation (0/6) 4  Total 16  Adjusted Score (based on education) 16    Objective:     PHYSICAL EXAMINATION:    VITALS:  There were no vitals filed for this visit.  GEN:  The patient appears stated age and is in NAD. HEENT:  Normocephalic, atraumatic.   Neurological examination:  General: NAD, well-groomed, appears stated age. Orientation: The patient is alert. Oriented to person, place and not to date Cranial nerves: There is good facial symmetry.The speech is fluent and clear. No aphasia or dysarthria. Fund of knowledge is appropriate. Recent and remote memory are impaired. Attention and concentration are reduced. Able to name objects and repeat phrases.  Hearing is intact to conversational tone. *** Sensation: Sensation is intact to light touch throughout Motor: Strength is at least antigravity x4. DTR's 2/4 in UE/LE     Movement examination: Tone: There is normal tone in the  UE/LE Abnormal movements:  no tremor.  No myoclonus.  No asterixis.   Coordination:  There is no decremation with RAM's. Normal finger to nose  Gait and Station: The patient has no*** difficulty arising out of a deep-seated chair without the use of the hands. The patient's stride length is good.  Gait is cautious and narrow.    Thank you for allowing us   the opportunity to participate in the care of this nice patient. Please do not hesitate to contact us  for any questions or concerns.   Total time spent on today's visit was *** minutes dedicated to this patient today, preparing to see patient, examining the patient, ordering tests and/or medications and counseling the patient, documenting clinical information in the EHR or other health record, independently interpreting results and communicating results to the patient/family, discussing treatment and goals, answering patient's questions and coordinating care.  Cc:  Norleen Lynwood ORN, MD  Camie Sevin 07/03/2024 7:01 AM

## 2024-07-04 ENCOUNTER — Encounter: Payer: Self-pay | Admitting: Physician Assistant

## 2024-07-04 ENCOUNTER — Ambulatory Visit: Admitting: Physician Assistant

## 2024-07-04 VITALS — BP 121/68 | HR 87 | Resp 20 | Wt 128.0 lb

## 2024-07-04 DIAGNOSIS — G301 Alzheimer's disease with late onset: Secondary | ICD-10-CM | POA: Diagnosis not present

## 2024-07-04 DIAGNOSIS — F02A4 Dementia in other diseases classified elsewhere, mild, with anxiety: Secondary | ICD-10-CM | POA: Diagnosis not present

## 2024-07-04 MED ORDER — DONEPEZIL HCL 10 MG PO TABS: 10.0000 mg | ORAL_TABLET | Freq: Every day | ORAL | 3 refills | Status: AC

## 2024-07-04 MED ORDER — MEMANTINE HCL 10 MG PO TABS: 10.0000 mg | ORAL_TABLET | Freq: Two times a day (BID) | ORAL | 3 refills | Status: AC

## 2024-07-04 NOTE — Patient Instructions (Signed)
 It was a pleasure to see you today at our office.   Recommendations:  Continue donepezil  10 mg daily  Continue  Memantine  5 mg  twice a day  Follow up 6 months   [email or web address]   RECOMMENDATIONS FOR ALL PATIENTS WITH MEMORY PROBLEMS: 1. Continue to exercise (Recommend 30 minutes of walking everyday, or 3 hours every week) 2. Increase social interactions - continue going to [place] and enjoy social gatherings with friends and family 3. Eat healthy, avoid fried foods and eat more fruits and vegetables 4. Maintain adequate blood pressure, blood sugar, and blood cholesterol level. Reducing the risk of stroke and cardiovascular disease also helps promoting better memory. 5. Avoid stressful situations. Live a simple life and avoid aggravations. Organize your time and prepare for the next day in anticipation. 6. Sleep well, avoid any interruptions of sleep and avoid any distractions in the bedroom that may interfere with adequate sleep quality 7. Avoid sugar, avoid sweets as there is a strong link between excessive sugar intake, diabetes, and cognitive impairment We discussed the Mediterranean diet, which has been shown to help patients reduce the risk of progressive memory disorders and reduces cardiovascular risk. This includes eating fish, eat fruits and green leafy vegetables, nuts like almonds and hazelnuts, walnuts, and also use olive oil. Avoid fast foods and fried foods as much as possible. Avoid sweets and sugar as sugar use has been linked to worsening of memory function.  There is always a concern of gradual progression of memory problems. If this is the case, then we may need to adjust level of care according to patient needs. Support, both to the patient and caregiver, should then be put into place.    The Alzheimer's Association is here all day, every day for people facing Alzheimer's disease through our free 24/7 Helpline:  [phone]. The Helpline provides reliable information and support to all those who need assistance, such as individuals living with memory loss, Alzheimer's or other dementia, caregivers, health care professionals and the public.  Our highly trained and knowledgeable staff can help you with: Understanding memory loss, dementia and Alzheimer's  Medications and other treatment options  General information about aging and brain health  Skills to provide quality care and to find the best care from professionals  Legal, financial and living-arrangement decisions Our Helpline also features: Confidential care consultation provided by master's level clinicians who can help with decision-making support, crisis assistance and education on issues families face every day  Help in a caller's preferred language using our translation service that features more than 200 languages and dialects  Referrals to local community programs, services and ongoing support     FALL PRECAUTIONS: Be cautious when walking. Scan the area for obstacles that may increase the risk of trips and falls. When getting up in the mornings, sit up at the edge of the bed for a few minutes before getting out of bed. Consider elevating the bed at the head end to avoid drop of blood pressure when getting up. Walk always in a well-lit room (use night lights in the walls). Avoid area rugs or power cords from appliances in the middle of the walkways. Use a walker or a cane if necessary and consider physical therapy for balance exercise. Get your eyesight checked regularly.  FINANCIAL OVERSIGHT: Supervision, especially oversight when making financial decisions or transactions is also recommended.  HOME SAFETY: Consider the safety of the kitchen when operating appliances like stoves, microwave oven, and blender. Consider having supervision and  share cooking responsibilities until no longer able to participate in those. Accidents with firearms and  other hazards in the house should be identified and addressed as well.   ABILITY TO BE LEFT ALONE: If patient is unable to contact 911 operator, consider using LifeLine, or when the need is there, arrange for someone to stay with patients. Smoking is a fire hazard, consider supervision or cessation. Risk of wandering should be assessed by caregiver and if detected at any point, supervision and safe proof recommendations should be instituted.  MEDICATION SUPERVISION: Inability to self-administer medication needs to be constantly addressed. Implement a mechanism to ensure safe administration of the medications.          Mediterranean Diet A Mediterranean diet refers to food and lifestyle choices that are based on the traditions of countries located on the [employer] [place]. This way of eating has been shown to help prevent certain conditions and improve outcomes for people who have chronic diseases, like kidney disease and heart disease. What are tips for following this plan? Lifestyle  Cook and eat meals together with your family, when possible. Drink enough fluid to keep your urine clear or pale yellow. Be physically active every day. This includes: Aerobic exercise like running or swimming. Leisure activities like gardening, walking, or housework. Get 7-8 hours of sleep each night. If recommended by your health care provider, drink red wine in moderation. This means 1 glass a day for nonpregnant women and 2 glasses a day for men. A glass of wine equals 5 oz (150 mL). Reading food labels  Check the serving size of packaged foods. For foods such as rice and pasta, the serving size refers to the amount of cooked product, not dry. Check the total fat in packaged foods. Avoid foods that have saturated fat or trans fats. Check the ingredients list for added sugars, such as corn syrup. Shopping  At the grocery store, buy most of your food from the areas near the walls of the store. This  includes: Fresh fruits and vegetables (produce). Grains, beans, nuts, and seeds. Some of these may be available in unpackaged forms or large amounts (in bulk). Fresh seafood. Poultry and eggs. Low-fat dairy products. Buy whole ingredients instead of prepackaged foods. Buy fresh fruits and vegetables in-season from local farmers markets. Buy frozen fruits and vegetables in resealable bags. If you do not have access to quality fresh seafood, buy precooked frozen shrimp or canned fish, such as tuna, salmon, or sardines. Buy small amounts of raw or cooked vegetables, salads, or olives from the deli or salad bar at your store. Stock your pantry so you always have certain foods on hand, such as olive oil, canned tuna, canned tomatoes, rice, pasta, and beans. Cooking  Cook foods with extra-virgin olive oil instead of using butter or other vegetable oils. Have meat as a side dish, and have vegetables or grains as your main dish. This means having meat in small portions or adding small amounts of meat to foods like pasta or stew. Use beans or vegetables instead of meat in common dishes like chili or lasagna. Experiment with different cooking methods. Try roasting or broiling vegetables instead of steaming or sauteing them. Add frozen vegetables to soups, stews, pasta, or rice. Add nuts or seeds for added healthy fat at each meal. You can add these to yogurt, salads, or vegetable dishes. Marinate fish or vegetables using olive oil, lemon juice, garlic, and fresh herbs. Meal planning  Plan to eat 1 vegetarian  meal one day each week. Try to work up to 2 vegetarian meals, if possible. Eat seafood 2 or more times a week. Have healthy snacks readily available, such as: Vegetable sticks with hummus. Greek yogurt. Fruit and nut trail mix. Eat balanced meals throughout the week. This includes: Fruit: 2-3 servings a day Vegetables: 4-5 servings a day Low-fat dairy: 2 servings a day Fish, poultry, or  lean meat: 1 serving a day Beans and legumes: 2 or more servings a week Nuts and seeds: 1-2 servings a day Whole grains: 6-8 servings a day Extra-virgin olive oil: 3-4 servings a day Limit red meat and sweets to only a few servings a month What are my food choices? Mediterranean diet Recommended Grains: Whole-grain pasta. Oler rice. Bulgar wheat. Polenta. Couscous. Whole-wheat bread. [place] Made[place]. Vegetables: Artichokes. Beets. Broccoli. Cabbage. Carrots. Eggplant. Green beans. Chard. Kale. Spinach. Onions. Leeks. Peas. Squash. Tomatoes. Peppers. Radishes. Fruits: Apples. Apricots. Avocado. Berries. Bananas. Cherries. Dates. Figs. Grapes. Lemons. Melon. Oranges. Peaches. Plums. Pomegranate. Meats and other protein foods: Beans. Almonds. Sunflower seeds. Pine nuts. Peanuts. Cod. Salmon. Scallops. Shrimp. Tuna. Tilapia. Clams. Oysters. Eggs. Dairy: Low-fat milk. Cheese. Greek yogurt. Beverages: Water . Red wine. Herbal tea. Fats and oils: Extra virgin olive oil. Avocado oil. Grape seed oil. Sweets and desserts: Greek yogurt with honey. Baked apples. Poached pears. Trail mix. Seasoning and other foods: Basil. Cilantro. Coriander. Cumin. Mint. Parsley. Sage. Rosemary. Tarragon. Garlic. Oregano. Thyme. Pepper. Balsalmic vinegar. Tahini. Hummus. Tomato sauce. Olives. Mushrooms. Limit these Grains: Prepackaged pasta or rice dishes. Prepackaged cereal with added sugar. Vegetables: Deep fried potatoes (french fries). Fruits: Fruit canned in syrup. Meats and other protein foods: Beef. Pork. Lamb. Poultry with skin. Hot dogs. [place]. Dairy: Ice cream. Sour cream. Whole milk. Beverages: Juice. Sugar-sweetened soft drinks. Beer. Liquor and spirits. Fats and oils: Butter. Canola oil. Vegetable oil. Beef fat (tallow). Lard. Sweets and desserts: Cookies. Cakes. Pies. Candy. Seasoning and other foods: Mayonnaise. Premade sauces and marinades. The items listed may not be a complete list. Talk with your  dietitian about what dietary choices are right for you. Summary The Mediterranean diet includes both food and lifestyle choices. Eat a variety of fresh fruits and vegetables, beans, nuts, seeds, and whole grains. Limit the amount of red meat and sweets that you eat. Talk with your health care provider about whether it is safe for you to drink red wine in moderation. This means 1 glass a day for nonpregnant women and 2 glasses a day for men. A glass of wine equals 5 oz (150 mL). This information is not intended to replace advice given to you by your health care provider. Make sure you discuss any questions you have with your health care provider. Document Released: 03/30/2016 Document Revised: 05/02/2016 Document Reviewed: 03/30/2016 Elsevier Interactive Patient Education  2017 [employer].

## 2024-07-28 ENCOUNTER — Encounter: Payer: Self-pay | Admitting: Surgery

## 2024-07-28 ENCOUNTER — Other Ambulatory Visit: Payer: Self-pay | Admitting: Surgery

## 2024-07-28 DIAGNOSIS — R1902 Left upper quadrant abdominal swelling, mass and lump: Secondary | ICD-10-CM

## 2024-07-28 DIAGNOSIS — G300 Alzheimer's disease with early onset: Secondary | ICD-10-CM

## 2024-08-08 ENCOUNTER — Inpatient Hospital Stay: Admission: RE | Admit: 2024-08-08 | Discharge: 2024-08-08 | Attending: Surgery | Admitting: Surgery

## 2024-08-08 ENCOUNTER — Other Ambulatory Visit: Payer: Self-pay | Admitting: Surgery

## 2024-08-08 DIAGNOSIS — G300 Alzheimer's disease with early onset: Secondary | ICD-10-CM

## 2024-08-08 DIAGNOSIS — R1902 Left upper quadrant abdominal swelling, mass and lump: Secondary | ICD-10-CM

## 2024-08-13 ENCOUNTER — Emergency Department (HOSPITAL_COMMUNITY)

## 2024-08-13 ENCOUNTER — Emergency Department (HOSPITAL_COMMUNITY)
Admission: EM | Admit: 2024-08-13 | Discharge: 2024-08-13 | Disposition: A | Discharge status: Home / Self Care | Source: Skilled Nursing Facility

## 2024-08-13 DIAGNOSIS — W19XXXA Unspecified fall, initial encounter: Secondary | ICD-10-CM | POA: Diagnosis not present

## 2024-08-13 DIAGNOSIS — S0990XA Unspecified injury of head, initial encounter: Secondary | ICD-10-CM | POA: Diagnosis present

## 2024-08-13 DIAGNOSIS — N183 Chronic kidney disease, stage 3 unspecified: Secondary | ICD-10-CM | POA: Insufficient documentation

## 2024-08-13 DIAGNOSIS — Y9301 Activity, walking, marching and hiking: Secondary | ICD-10-CM | POA: Insufficient documentation

## 2024-08-13 DIAGNOSIS — S0240CB Maxillary fracture, right side, initial encounter for open fracture: Secondary | ICD-10-CM | POA: Diagnosis not present

## 2024-08-13 DIAGNOSIS — Y92480 Sidewalk as the place of occurrence of the external cause: Secondary | ICD-10-CM | POA: Insufficient documentation

## 2024-08-13 DIAGNOSIS — I129 Hypertensive chronic kidney disease with stage 1 through stage 4 chronic kidney disease, or unspecified chronic kidney disease: Secondary | ICD-10-CM | POA: Diagnosis not present

## 2024-08-13 DIAGNOSIS — E039 Hypothyroidism, unspecified: Secondary | ICD-10-CM | POA: Diagnosis not present

## 2024-08-13 DIAGNOSIS — F0390 Unspecified dementia without behavioral disturbance: Secondary | ICD-10-CM | POA: Insufficient documentation

## 2024-08-13 DIAGNOSIS — Z79899 Other long term (current) drug therapy: Secondary | ICD-10-CM | POA: Insufficient documentation

## 2024-08-13 DIAGNOSIS — Z85828 Personal history of other malignant neoplasm of skin: Secondary | ICD-10-CM | POA: Insufficient documentation

## 2024-08-13 DIAGNOSIS — S0240DB Maxillary fracture, left side, initial encounter for open fracture: Secondary | ICD-10-CM | POA: Diagnosis not present

## 2024-08-13 DIAGNOSIS — S0232XA Fracture of orbital floor, left side, initial encounter for closed fracture: Secondary | ICD-10-CM | POA: Diagnosis not present

## 2024-08-13 DIAGNOSIS — S60212A Contusion of left wrist, initial encounter: Secondary | ICD-10-CM | POA: Diagnosis not present

## 2024-08-13 MED ORDER — LIDOCAINE-EPINEPHRINE (PF) 2 %-1:200000 IJ SOLN
10.0000 mL | Freq: Once | INTRAMUSCULAR | Status: DC
  Filled 2024-08-13: qty 20

## 2024-08-13 MED ORDER — HYDROGEN PEROXIDE 3 % EX SOLN
CUTANEOUS | Status: AC
  Filled 2024-08-13: qty 473

## 2024-08-13 MED ORDER — LIDOCAINE-EPINEPHRINE-TETRACAINE (LET) TOPICAL GEL
3.0000 mL | Freq: Once | TOPICAL | Status: AC
  Administered 2024-08-13: 3 mL via TOPICAL
  Filled 2024-08-13: qty 3

## 2024-08-13 MED ORDER — AMOXICILLIN-POT CLAVULANATE 875-125 MG PO TABS
1.0000 | ORAL_TABLET | Freq: Once | ORAL | Status: AC
  Administered 2024-08-13: 1 via ORAL
  Filled 2024-08-13 (×2): qty 1

## 2024-08-13 MED ORDER — AMOXICILLIN-POT CLAVULANATE 875-125 MG PO TABS: 1.0000 | ORAL_TABLET | Freq: Two times a day (BID) | ORAL | 0 refills | Status: AC

## 2024-08-13 NOTE — ED Notes (Addendum)
 [facility] guardian Hayden Barnard-director of guidance services). Expressed consent is given that pt can go home with Carla Lovell.

## 2024-08-13 NOTE — ED Notes (Signed)
 [facility] after hours number called. No answer. Message left

## 2024-08-13 NOTE — ED Notes (Signed)
Suture cart bedside. 

## 2024-08-13 NOTE — ED Notes (Signed)
 Tobias Ivory stated her daughter will be able to pick up pt form hospital once she is ready for discharge

## 2024-08-13 NOTE — ED Provider Notes (Signed)
 " [place] [facility] DE[facility]MENT AT [facility] Provider Note   CSN: [id number] Arrival date & time: 08/13/24  1202     Patient presents with: Alyssa Fernandez   BETTYMAE YOTT is a 83 y.o. female.   She has a 83 year old female who presents with EMS for concern of a mechanical fall.  She states she was walking on the sidewalk when she tripped over a raised metal piece that is attached to an electrical box.  Complains of pain to her face where she has multiple skin lesions.  Denies any head or neck pain.  No other joint pain.  She is alert and oriented on my exam.  Does have history of dementia.  Lives in [place]. Appears per chart review she has a legal guardian.  She tells me not to inform any of her family member especially her daughter because there is a court order.  The history is provided by the patient. No language interpreter was used.       Prior to Admission medications  Medication Sig Start Date End Date Taking? Authorizing Provider  acetaminophen  (TYLENOL ) 325 MG tablet Take 650 mg by mouth every 6 (six) hours as needed (pain).    [provider]  amLODipine  (NORVASC ) 5 MG tablet Take 5 mg by mouth daily.    [provider]  busPIRone  (BUSPAR ) 7.5 MG tablet Take 7.5 mg by mouth 2 (two) times daily.    [provider]  diclofenac  Sodium (VOLTAREN ) 1 % GEL Apply 2 g topically 4 (four) times daily.    [provider]  donepezil  (ARICEPT ) 10 MG tablet Take 1 tablet (10 mg total) by mouth daily. 07/04/24   Wertman, Sara E, PA-C  ferrous sulfate  325 (65 FE) MG tablet Take 1 tablet (325 mg total) by mouth daily. 07/31/22 07/04/24  Sheldon Standing, MD  FIBER PO Take 2 capsules by mouth 3 (three) times daily.    [provider]  labetalol  (NORMODYNE ) 300 MG tablet Take 300 mg by mouth 2 (two) times daily. Patient not taking: Reported on 01/02/2024    [provider]  memantine  (NAMENDA ) 10 MG tablet Take 1 tablet (10  mg total) by mouth 2 (two) times daily. 07/04/24   Wertman, Sara E, PA-C  pantoprazole  (PROTONIX ) 40 MG tablet Take 40 mg by mouth daily.    [provider]  potassium chloride  (KLOR-CON ) 10 MEQ tablet Take 10 mEq by mouth daily.    [provider]  rosuvastatin  (CRESTOR ) 20 MG tablet Take 20 mg by mouth every evening.    [provider]  sertraline  (ZOLOFT ) 100 MG tablet 1 tab by mouth once daily 03/13/22   Norleen Lynwood ORN, MD  spironolactone (ALDACTONE) 25 MG tablet Take 25 mg by mouth daily.    [provider]  telmisartan  (MICARDIS ) 80 MG tablet Take 1 tablet (80 mg total) by mouth daily. 03/13/22   Norleen Lynwood ORN, MD  vitamin B-12 (CYANOCOBALAMIN ) 1000 MCG tablet Take 1 tablet (1,000 mcg total) by mouth daily. 05/13/21   Norleen Lynwood ORN, MD    Allergies: Codeine, Shellfish allergy, Sulfonamide derivatives, and Zocor  [simvastatin ]    Review of Systems  Constitutional:  Negative for chills and fever.  Eyes:  Negative for visual disturbance.  Respiratory:  Negative for shortness of breath.   Musculoskeletal:  Negative for arthralgias and neck pain.  Neurological:  Negative for light-headedness and headaches.  All other systems reviewed and are negative.   Updated Vital Signs  BP (!) 143/94 (BP Location: Right Arm)   Pulse 89   Temp 98.3 F (36.8 C) (Oral)   Resp 16   Ht 5' 4 (1.626 m)   Wt 58.1 kg   SpO2 95%   BMI 21.99 kg/m   Physical Exam Vitals and nursing note reviewed.  Constitutional:      General: She is not in acute distress.    Appearance: Normal appearance. She is not ill-appearing.  HENT:     Head: Normocephalic and atraumatic.     Nose: Nose normal.  Eyes:     Conjunctiva/sclera: Conjunctivae normal.     Comments: .  EOMs intact.  No pain with EOMs.  No signs of entrapment  Pulmonary:     Effort: Pulmonary effort is normal. No respiratory distress.  Abdominal:     General: There is no distension.     Palpations: Abdomen is  soft.     Tenderness: There is no abdominal tenderness. There is no guarding.  Musculoskeletal:        General: No deformity.     Comments: Without chest wall tenderness to palpation.  Good range of motion in bilateral upper and lower extremities with good strength.  Cervical spine with good range of motion.  No tenderness to palpation of cervical spine.  Thoracic and lumbar spine without tenderness to palpation.  Skin:    Findings: No rash.     Comments: Lacerations/concerns noted to the face mostly on the left side above the left eyebrow, on the left cheek. Bruise noted to left wrist.  No tenderness to palpation.  Neurological:     Mental Status: She is alert.     (all labs ordered are listed, but only abnormal results are displayed) Labs Reviewed - No data to display  EKG: None  Radiology: No results found.   Procedures   Medications Ordered in the ED - No data to display  Clinical Course as of 08/13/24 1554  Wed Aug 13, 2024  1404 CT head and C-spine without acute intracranial or cervical spine findings.  CT maxillofacial does show inferior orbital wall fracture which is comminuted but largely nondisplaced as well as a posterior maxillary sinus wall fracture.  EOMs are intact without pain.  Will discuss with maxillofacial surgeon.  Patient made aware of these findings. [AA]  [id number] Spoke with Dr. Joanette of maxillofacial who recommends starting patient on Augmentin , sinus precautions and following up in clinic in 7-10 days. [AA]  1413 Attempted to call Luke Louder was listed as patient's emergency contact.  However I received a message saying that that number is disconnected.  Called [facility] at [place] but the call went to voicemail.  Will try again later.  Will also have nursing to try to make contact. [AA]  1553 Patient attempted to call Ms. Farrell off of her cell phone which she states is her legal guardian.  Went to [employer]. [AA]  [phone] Nurse was able to get in contact with a  family friend who will pick patient up. [AA]    Clinical Course User Index [AA] Hildegard Loge, PA-C                                 Medical Decision Making Amount and/or Complexity of Data Reviewed Radiology: ordered.  Risk Prescription drug management.   Medical Decision Making / ED Course   This patient presents to the ED for concern of fall, this  involves an extensive number of treatment options, and is a complaint that carries with it a high risk of complications and morbidity.  The differential diagnosis includes fracture, intracranial bleed, neck fracture, contusion  MDM: 83 year old female presents today for concern of mechanical fall.  Complains of pain to the left side of her face. No loss consciousness.  Denies syncopal episode.  Not on blood thinning medicine. CT results as noted above.  No signs of entrapment on CT. SABRA  Discussed with maxillofacial surgeon. Recommendations listed above.  Sinus precautions given.  First dose of Augmentin  given.  Paper prescription given.  Patient stable for discharge . Discharged in stable condition.  Return consciousness.  No lacerations that would require repair.  Multiple skin tears noted but no lacerations.  No active bleeding. Tetanus was last updated in August 2018.  Given not a significantly dirty wound she is within propria timeline and does not require booster.  Wound extensively cleansed.   Lab Tests: -I ordered, reviewed, and interpreted labs.   The pertinent results include:   Labs Reviewed - No data to display    EKG  EKG Interpretation Date/Time:    Ventricular Rate:    PR Interval:    QRS Duration:    QT Interval:    QTC Calculation:   R Axis:      Text Interpretation:           Imaging Studies ordered: I ordered imaging studies including CT head, CT maxillofacial, CT C-spine I independently visualized and interpreted imaging. I agree with the radiologist interpretation   Medicines ordered and  prescription drug management: No orders of the defined types were placed in this encounter.   -I have reviewed the patients home medicines and have made adjustments as needed   Social Determinants of Health:  Factors impacting patients care include: Dementia, unable to contact legal guardian   Reevaluation: After the interventions noted above, I reevaluated the patient and found that they have :improved  Co morbidities that complicate the patient evaluation  Past Medical History:  Diagnosis Date   Acute pyelonephritis 04/13/2017   Atherosclerosis of aorta 08/01/2017   Bilateral foot pain 11/20/2012   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   Chronic low back pain 05/04/2016   CKD (chronic kidney disease) stage 3, GFR 30-59 ml/min (HCC) 08/01/2017   Decreased anal sphincter tone 06/21/2022   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   Severe by CT April 2012   Degenerative joint disease 05/26/2014   Dehydration 05/06/2012   Dermatitis 01/20/2010   Dyspnea 02/03/2014   Easy bruising 12/02/2015   Epigastric pain 12/14/2021   Essential hypertension 02/24/2010   Fatigue 11/25/2014   Generalized anxiety disorder 11/16/2011   GERD    Hepatic abscess 05/03/2012   History of kidney stones    History of skin cancer    leg   Hives 04/08/2014   Hyperlipidemia    Hypokalemia 05/06/2012   Hypothyroidism 02/12/2008   Insomnia    Internal hemorrhoids 03/22/2022   Irritable bowel syndrome with diarrhea 10/16/2022   Liver abscess 06/19/2012   Low blood pressure 06/19/2012   Lumbar radiculopathy 05/27/2021   Major depressive disorder 12/14/2021   Menopausal and postmenopausal disorder 03/09/2009   Mild dementia, concerns for Alzheimer's disease 12/12/2022   Normocytic anemia 05/07/2012   Osteopenia    Osteoporosis 01/30/2018   Penetrating forearm wound, left 05/16/2020   Peripheral neuropathy 02/05/2013   Polycystic kidney 02/12/2008   Pruritus ani  08/17/2022   Rash 11/20/2011   Rectal prolapse s/p robotic LAR/rectopexy 07/26/2022   Right wrist pain 11/09/2019   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  Return precaution discussed.   Final diagnoses:  Fall, initial encounter  Closed fracture of left orbital floor, initial encounter [facility])  Open fracture of both maxillary sinuses, initial encounter [facility])    ED Discharge Orders          Ordered    amoxicillin -clavulanate (AUGMENTIN ) 875-125 MG tablet  Every 12 hours        08/13/24 1551               Hildegard Loge, PA-C 08/13/24 1603    Ula Prentice SAUNDERS, MD 08/13/24 1635  "

## 2024-08-13 NOTE — Discharge Instructions (Signed)
 Follow-up with Dr. Joanette the maxillofacial surgeon in 7-10 days.  Take antibiotics as prescribed.  Take Tylenol  as needed for pain control.  CT head and CT of the neck did not show any concerning findings.  CT of your face did show a couple fractures involving the bottom of the eye socket.  Sinus precautions Please follow these instructions carefully for the next 3-4 weeks. Do not blow your nose! Wipe nasal secretions gently. Do not use a straw. Do not smoke. Try to avoid sneezing. If you do sneeze, sneeze with your mouth open--do NOT block the sneeze by pinching your nose. Avoid swimming, scuba diving, playing a wind instrument, blowing up balloons, or other things that cause pressure changes in your mouth. If you feel congested or have a runny nose, use an over-the-counter nasal decongestant or antihistamine (Sudafed, Claritin-D 24, etc.). Keep the site in your mouth clean as instructed by gently rinsing beginning the day after surgery. Do not use excessive force while rinsing. Avoid brushing directly over the area for the first 2 weeks. Leave the site alone--dont touch it with fingers, toothpicks, etc. Do not use a Waterpik near the site. Avoid bending over--try to keep your head above the level of your heart. Sleep with your head slightly raised. Do not strain by pushing or lifting heavy objects. Chew softer foods on the other side of your mouth. Take any antibiotics or other medications as prescribed.

## 2024-08-13 NOTE — ED Notes (Signed)
 Family friend Tobias Ivory contacted. Pt gave permission and gave nurse her number. Tobias stated that pt is supposed to spend the holiday with her. Tobias stated that she may be willing to take pt back to [place] of [place] at time of discharge.

## 2024-08-13 NOTE — ED Notes (Signed)
 Attempt made to contact the guardian and the facility.

## 2024-08-13 NOTE — ED Triage Notes (Signed)
 Pt BIB EMS for Alyssa Fernandez. Lost footing when moving groceries. Was laying outside for abt 20 minutes.  Lacerations to face and hematomas on left cheek. Hx of dementia; baseline is AO4 but she asks repetitive questions. No thinners or LOC.  EMS vitals  BP 140/64 HR 89 SPO2 94 RA  CBG 114

## 2024-08-13 NOTE — ED Notes (Addendum)
 Ronal Fair called to confirm that she is her legal guardian. Daughter states that her guardian has changed and it now [employer] [place] Rick Louder) : [phone]. After hours number is [phone]

## 2024-08-27 ENCOUNTER — Ambulatory Visit
Admission: RE | Admit: 2024-08-27 | Discharge: 2024-08-27 | Disposition: A | Discharge status: Home / Self Care | Source: Ambulatory Visit | Attending: Surgery | Admitting: Surgery

## 2024-08-29 ENCOUNTER — Ambulatory Visit: Payer: Self-pay | Admitting: Surgery

## 2025-01-01 ENCOUNTER — Ambulatory Visit: Admitting: Physician Assistant
